# Patient Record
Sex: Male | Born: 1985 | Race: White | Hispanic: No | Marital: Single | Smoking: Current every day smoker
Health system: Southern US, Community
[De-identification: ages and names within clinical notes are randomized; demographics above are authoritative.]

## PROBLEM LIST (undated history)

## (undated) DIAGNOSIS — K219 Gastro-esophageal reflux disease without esophagitis: Secondary | ICD-10-CM

## (undated) DIAGNOSIS — K859 Acute pancreatitis without necrosis or infection, unspecified: Secondary | ICD-10-CM

## (undated) HISTORY — PX: TESTICLE SURGERY: SHX794

## (undated) HISTORY — PX: NO PAST SURGERIES: SHX2092

---

## 2014-12-01 ENCOUNTER — Emergency Department (HOSPITAL_COMMUNITY)
Admission: EM | Admit: 2014-12-01 | Discharge: 2014-12-01 | Disposition: A | Discharge status: Home / Self Care | Payer: Self-pay | Attending: Emergency Medicine | Admitting: Emergency Medicine

## 2014-12-01 ENCOUNTER — Emergency Department (HOSPITAL_COMMUNITY): Payer: Self-pay

## 2014-12-01 ENCOUNTER — Encounter (HOSPITAL_COMMUNITY): Payer: Self-pay | Admitting: Emergency Medicine

## 2014-12-01 DIAGNOSIS — Y9289 Other specified places as the place of occurrence of the external cause: Secondary | ICD-10-CM | POA: Insufficient documentation

## 2014-12-01 DIAGNOSIS — Y998 Other external cause status: Secondary | ICD-10-CM | POA: Insufficient documentation

## 2014-12-01 DIAGNOSIS — W1839XA Other fall on same level, initial encounter: Secondary | ICD-10-CM | POA: Insufficient documentation

## 2014-12-01 DIAGNOSIS — Z720 Tobacco use: Secondary | ICD-10-CM | POA: Insufficient documentation

## 2014-12-01 DIAGNOSIS — S20212A Contusion of left front wall of thorax, initial encounter: Secondary | ICD-10-CM | POA: Insufficient documentation

## 2014-12-01 DIAGNOSIS — Y9389 Activity, other specified: Secondary | ICD-10-CM | POA: Insufficient documentation

## 2014-12-01 DIAGNOSIS — S3992XA Unspecified injury of lower back, initial encounter: Secondary | ICD-10-CM | POA: Insufficient documentation

## 2014-12-01 MED ORDER — IBUPROFEN 800 MG PO TABS
800.0000 mg | ORAL_TABLET | Freq: Once | ORAL | Status: AC
  Administered 2014-12-01: 800 mg via ORAL
  Filled 2014-12-01: qty 1

## 2014-12-01 MED ORDER — BACLOFEN 10 MG PO TABS: 10.0000 mg | ORAL_TABLET | Freq: Three times a day (TID) | ORAL | Status: AC

## 2014-12-01 MED ORDER — HYDROCODONE-ACETAMINOPHEN 5-325 MG PO TABS
1.0000 | ORAL_TABLET | Freq: Once | ORAL | Status: AC
  Administered 2014-12-01: 1 via ORAL
  Filled 2014-12-01: qty 1

## 2014-12-01 MED ORDER — HYDROCODONE-ACETAMINOPHEN 5-325 MG PO TABS: 1.0000 | ORAL_TABLET | ORAL | Status: DC | PRN

## 2014-12-01 MED ORDER — DIAZEPAM 5 MG PO TABS
5.0000 mg | ORAL_TABLET | Freq: Once | ORAL | Status: AC
  Administered 2014-12-01: 5 mg via ORAL
  Filled 2014-12-01: qty 1

## 2014-12-01 NOTE — Discharge Instructions (Signed)
The x-ray of your ribs and chest are negative for fracture or other acute problem. Please practice taking deep breaths several times during the hour. Please use baclofen and ibuprofen daily. May use Norco for more severe pain. Chest Contusion A chest contusion is a deep bruise on your chest area. Contusions are the result of an injury that caused bleeding under the skin. A chest contusion may involve bruising of the skin, muscles, or ribs. The contusion may turn blue, purple, or yellow. Minor injuries will give you a painless contusion, but more severe contusions may stay painful and swollen for a few weeks. CAUSES  A contusion is usually caused by a blow, trauma, or direct force to an area of the body. SYMPTOMS   Swelling and redness of the injured area.  Discoloration of the injured area.  Tenderness and soreness of the injured area.  Pain. DIAGNOSIS  The diagnosis can be made by taking a history and performing a physical exam. An X-ray, CT scan, or MRI may be needed to determine if there were any associated injuries, such as broken bones (fractures) or internal injuries. TREATMENT  Often, the best treatment for a chest contusion is resting, icing, and applying cold compresses to the injured area. Deep breathing exercises may be recommended to reduce the risk of pneumonia. Over-the-counter medicines may also be recommended for pain control. HOME CARE INSTRUCTIONS   Put ice on the injured area.  Put ice in a plastic bag.  Place a towel between your skin and the bag.  Leave the ice on for 15-20 minutes, 03-04 times a day.  Only take over-the-counter or prescription medicines as directed by your caregiver. Your caregiver may recommend avoiding anti-inflammatory medicines (aspirin, ibuprofen, and naproxen) for 48 hours because these medicines may increase bruising.  Rest the injured area.  Perform deep-breathing exercises as directed by your caregiver.  Stop smoking if you  smoke.  Do not lift objects over 5 pounds (2.3 kg) for 3 days or longer if recommended by your caregiver. SEEK IMMEDIATE MEDICAL CARE IF:   You have increased bruising or swelling.  You have pain that is getting worse.  You have difficulty breathing.  You have dizziness, weakness, or fainting.  You have blood in your urine or stool.  You cough up or vomit blood.  Your swelling or pain is not relieved with medicines. MAKE SURE YOU:   Understand these instructions.  Will watch your condition.  Will get help right away if you are not doing well or get worse. Document Released: 02/14/2001 Document Revised: 02/14/2012 Document Reviewed: 11/13/2011 [facility] Patient Information 2015 [place], [place]. This information is not intended to replace advice given to you by your health care provider. Make sure you discuss any questions you have with your health care provider.

## 2014-12-01 NOTE — ED Provider Notes (Signed)
CSN: [id number]     Arrival date & time 12/01/14  1326 History   First MD Initiated Contact with Patient 12/01/14 1538     Chief Complaint  Patient presents with  . Fall     (Consider location/radiation/quality/duration/timing/severity/associated sxs/prior Treatment) Patient is a 29 y.o. male presenting with fall. The history is provided by the patient.  Fall This is a new problem. The current episode started yesterday. The problem occurs intermittently. The problem has been gradually worsening. Pertinent negatives include no abdominal pain, coughing, fever, numbness or weakness. Associated symptoms comments: Low back pain and left rib pain. Exacerbated by: movement and palpation. He has tried acetaminophen for the symptoms. The treatment provided no relief.    History reviewed. No pertinent past medical history. History reviewed. No pertinent past surgical history. Family History  Problem Relation Age of Onset  . Cancer Other    History  Substance Use Topics  . Smoking status: Current Every Day Smoker -- 0.50 packs/day    Types: Cigarettes  . Smokeless tobacco: Never Used  . Alcohol Use: Yes     Comment: occas    Review of Systems  Constitutional: Negative for fever.  Respiratory: Negative for cough and shortness of breath.   Gastrointestinal: Negative for abdominal pain.  Musculoskeletal: Positive for back pain.  Neurological: Negative for weakness and numbness.  All other systems reviewed and are negative.     Allergies  Review of patient's allergies indicates no known allergies.  Home Medications   Prior to Admission medications   Not on File   BP 139/86 mmHg  Temp(Src) 98.4 F (36.9 C) (Oral)  Resp 14  Ht 5\' 11"  (1.803 m)  Wt 185 lb (83.915 kg)  BMI 25.81 kg/m2  SpO2 97% Physical Exam  Constitutional: He is oriented to person, place, and time. He appears well-developed and well-nourished.  Non-toxic appearance.  HENT:  Head: Normocephalic.  Right Ear:  Tympanic membrane and external ear normal.  Left Ear: Tympanic membrane and external ear normal.  Eyes: EOM and lids are normal. Pupils are equal, round, and reactive to light.  Neck: Normal range of motion. Neck supple. Carotid bruit is not present.  Cardiovascular: Normal rate, regular rhythm, normal heart sounds, intact distal pulses and normal pulses.   Pulmonary/Chest: Breath sounds normal. No respiratory distress.  Left chest wall  Pain to palpation and with deep breath. Symmetrical rise and fall of the chest.  Abdominal: Soft. Bowel sounds are normal. There is no tenderness. There is no guarding.  Musculoskeletal: Normal range of motion.  Lymphadenopathy:       Head (right side): No submandibular adenopathy present.       Head (left side): No submandibular adenopathy present.    He has no cervical adenopathy.  Neurological: He is alert and oriented to person, place, and time. He has normal strength. No cranial nerve deficit or sensory deficit.  Skin: Skin is warm and dry.  Psychiatric: He has a normal mood and affect. His speech is normal.  Nursing note and vitals reviewed.   ED Course  Procedures (including critical care time) Labs Review Labs Reviewed - No data to display  Imaging Review Dg Ribs Unilateral W/chest Left  12/01/2014   CLINICAL DATA:  Acute left-sided rib pain after fall yesterday.  EXAM: LEFT RIBS AND CHEST - 3+ VIEW  COMPARISON:  None.  FINDINGS: No fracture or other bone lesions are seen involving the ribs. There is no evidence of pneumothorax or pleural effusion. Both lungs  are clear. Heart size and mediastinal contours are within normal limits.  IMPRESSION: Normal left ribs.  No acute cardiopulmonary abnormality seen.   Electronically Signed   By: Marijo Conception, M.D.   On: 12/01/2014 14:20   Dg Lumbar Spine Complete  12/01/2014   CLINICAL DATA:  Low back pain and left-sided rib pain secondary a fall yesterday.  EXAM: LUMBAR SPINE - COMPLETE 4+ VIEW   COMPARISON:  None.  FINDINGS: There is no evidence of lumbar spine fracture. Alignment is normal. Intervertebral disc spaces are maintained.  IMPRESSION: Normal exam.   Electronically Signed   By: Lorriane Shire M.D.   On: 12/01/2014 14:20     EKG Interpretation None      MDM  Xray of the left ribs and chest is neg for rib fx or lung abnormality. Xray of the lumbar spine is negative for compression fracture. Pt informed of xray findings. Pulse ox 97% on room air. WNL by my interpretation.  Pt speaks in complete sentences. Pt to use baclofen and norco for pain. Discussed the need for frequently deep breaths. He will see PCP or return to the ED if any changes or problem.   Final diagnoses:  None    *I have reviewed nursing notes, vital signs, and all appropriate lab and imaging results for this patient.[address] PA-C 12/06/14 [place], MD 12/09/14 [phone]

## 2014-12-01 NOTE — ED Notes (Signed)
Pt reports falling yesterday, c/o low back pain and L side/rib pain.

## 2016-08-17 ENCOUNTER — Emergency Department (HOSPITAL_COMMUNITY): Payer: Self-pay

## 2016-08-17 ENCOUNTER — Emergency Department (HOSPITAL_COMMUNITY)
Admission: EM | Admit: 2016-08-17 | Discharge: 2016-08-17 | Disposition: A | Discharge status: Home / Self Care | Payer: Self-pay | Attending: Emergency Medicine | Admitting: Emergency Medicine

## 2016-08-17 ENCOUNTER — Encounter (HOSPITAL_COMMUNITY): Payer: Self-pay | Admitting: *Deleted

## 2016-08-17 DIAGNOSIS — F17210 Nicotine dependence, cigarettes, uncomplicated: Secondary | ICD-10-CM | POA: Insufficient documentation

## 2016-08-17 DIAGNOSIS — Y939 Activity, unspecified: Secondary | ICD-10-CM | POA: Insufficient documentation

## 2016-08-17 DIAGNOSIS — Y999 Unspecified external cause status: Secondary | ICD-10-CM | POA: Insufficient documentation

## 2016-08-17 DIAGNOSIS — R202 Paresthesia of skin: Secondary | ICD-10-CM | POA: Insufficient documentation

## 2016-08-17 DIAGNOSIS — S4992XA Unspecified injury of left shoulder and upper arm, initial encounter: Secondary | ICD-10-CM | POA: Insufficient documentation

## 2016-08-17 DIAGNOSIS — W01198A Fall on same level from slipping, tripping and stumbling with subsequent striking against other object, initial encounter: Secondary | ICD-10-CM | POA: Insufficient documentation

## 2016-08-17 DIAGNOSIS — Y929 Unspecified place or not applicable: Secondary | ICD-10-CM | POA: Insufficient documentation

## 2016-08-17 MED ORDER — METHOCARBAMOL 500 MG PO TABS: 1000.0000 mg | ORAL_TABLET | Freq: Four times a day (QID) | ORAL | 0 refills | Status: DC | PRN

## 2016-08-17 MED ORDER — HYDROCODONE-ACETAMINOPHEN 5-325 MG PO TABS
1.0000 | ORAL_TABLET | Freq: Once | ORAL | Status: AC
  Administered 2016-08-17: 1 via ORAL
  Filled 2016-08-17: qty 1

## 2016-08-17 MED ORDER — HYDROCODONE-ACETAMINOPHEN 5-325 MG PO TABS: ORAL_TABLET | ORAL | 0 refills | Status: DC

## 2016-08-17 NOTE — Discharge Instructions (Signed)
Take the prescriptions as directed.  Apply moist heat or ice to the area(s) of discomfort, for 15 minutes at a time, several times per day for the next few days.  Do not fall asleep on a heating or ice pack. Wear the sling as needed for comfort. You may have either a brachial plexus injury or an isolated shoulder joint injury.  Call the Orthopedic doctor today to schedule a follow up appointment within the next week.  Return to the Emergency Department immediately if worsening.

## 2016-08-17 NOTE — ED Notes (Signed)
Pt ambulated to Xray 

## 2016-08-17 NOTE — ED Notes (Signed)
Pt requesting pain medication. Dr Thurnell Garbe notified

## 2016-08-17 NOTE — ED Triage Notes (Signed)
Pt reports he went to bed at 1900 last night and woke up at 2130 with left arm numbness. Pt reports he has "pins and needles" feeling to the left arm. No facial droop, no slurred speech. Pt had fall yesterday and landed on left shoulder. Pt had no symptoms of left arm numbness immediately after fall. Pt has weak strength in left hand, unable to hold left arm up, left arm falls quickly to bed.

## 2016-08-17 NOTE — ED Provider Notes (Signed)
[place] DEPT Provider Note   CSN: [id number] Arrival date & time: 08/17/16  3716     History   Chief Complaint Chief Complaint  Patient presents with  . Numbness    left arm only  . Fall    last night    HPI David Miranda is a 31 y.o. male.   Fall     Pt was seen at [facility]. Per pt, c/o sudden onset and resolution of one episode of fall that occurred last evening. Pt states he tripped over a wire strung between to stakes in his yard last evening and landed onto his left side, specifically his left shoulder area. Pt states he laid down to sleep and when he woke up approximately 2 hours later his left arm "was tingling" (especially lower biceps/triceps area to hand) and he was unable to move his left shoulder. Also c/o left shoulder pain. States he "feels like his forearm is moving around on its own." Denies hitting head, no LOC, no AMS, no slurred speech, no facial droop, no back pain, no CP/SOB, no abd pain, no RUE symptoms, no lower extremities symptoms.    History reviewed. No pertinent past medical history.  There are no active problems to display for this patient.   History reviewed. No pertinent surgical history.     Home Medications    Prior to Admission medications   Medication Sig Start Date End Date Taking? Authorizing Provider  acetaminophen (TYLENOL) 500 MG tablet Take 1,000 mg by mouth every 6 (six) hours as needed for mild pain.    Historical Provider, MD  HYDROcodone-acetaminophen (NORCO/VICODIN) 5-325 MG per tablet Take 1 tablet by mouth every 4 (four) hours as needed. 12/01/14   Lily Kocher, PA-C    Family History Family History  Problem Relation Age of Onset  . Cancer Other     Social History Social History  Substance Use Topics  . Smoking status: Current Every Day Smoker    Packs/day: 0.50    Types: Cigarettes  . Smokeless tobacco: Never Used  . Alcohol use Yes     Comment: occas     Allergies   Patient has no known  allergies.   Review of Systems Review of Systems ROS: Statement: All systems negative except as marked or noted in the HPI; Constitutional: Negative for fever and chills. ; ; Eyes: Negative for eye pain, redness and discharge. ; ; ENMT: Negative for ear pain, hoarseness, nasal congestion, sinus pressure and sore throat. ; ; Cardiovascular: Negative for chest pain, palpitations, diaphoresis, dyspnea and peripheral edema. ; ; Respiratory: Negative for cough, wheezing and stridor. ; ; Gastrointestinal: Negative for nausea, vomiting, diarrhea, abdominal pain, blood in stool, hematemesis, jaundice and rectal bleeding. . ; ; Genitourinary: Negative for dysuria, flank pain and hematuria. ; ; Musculoskeletal: +left shoulder injury, pain. Negative for back pain and neck pain. Negative for swelling and deformity.; ; Skin: Negative for pruritus, rash, abrasions, blisters, bruising and skin lesion.; ; Neuro: +left shoulder weakness, paresthesias. Negative for headache, lightheadedness and neck stiffness. Negative for weakness, altered level of consciousness, altered mental status, involuntary movement, seizure and syncope.      Physical Exam Updated Vital Signs BP 146/99   Pulse 84   Temp 98.1 F (36.7 C) (Oral)   Resp 11   Ht 6' (1.829 m)   Wt 184 lb (83.5 kg)   SpO2 100%   BMI 24.95 kg/m   Physical Exam 0745: Physical examination:  Nursing notes reviewed; Vital signs and  O2 SAT reviewed;  Constitutional: Well developed, Well nourished, Well hydrated, In no acute distress; Head:  Normocephalic, atraumatic; Eyes: EOMI, PERRL, No scleral icterus; ENMT: Mouth and pharynx normal, Mucous membranes moist; Neck: Supple, Full range of motion, No lymphadenopathy; Cardiovascular: Regular rate and rhythm, No gallop; Respiratory: Breath sounds clear & equal bilaterally, No wheezes.  Speaking full sentences with ease, Normal respiratory effort/excursion; Chest: Nontender, Movement normal; Abdomen: Soft, Nontender,  Nondistended, Normal bowel sounds; Genitourinary: No CVA tenderness; Spine:  No midline CS, TS, LS tenderness.;  Extremities: Pulses normal, Left arm held with elbow flexed, forearm pronated. Left shoulder w/decreased ROM, esp with external rotation and ABD. +generalized TTP left shoulder, esp AC joint area. Left clavicle NT, scapula NT, proximal humerus NT, biceps tendon NT over bicipital groove.  Motor strength at shoulder decreased, unable to hold out left arm in ABD.  No obvious deformity, no abrasions, no ecchymosis, no edema. Left hand having intact and equal strength but decreased sensation,  in the distribution of the median, radial, and ulnar nerve function compared to opposite side.  Strong radial pulse. Left upper arm and forearm compartments soft. NT left elbow/wrist/hand. No edema, No calf edema or asymmetry.; Neuro: AA&Ox3, Major CN grossly intact.  Speech clear. Otherwise no gross focal motor or sensory deficits in extremities.; Skin: Color normal, Warm, Dry.   ED Treatments / Results  Labs (all labs ordered are listed, but only abnormal results are displayed)   EKG  EKG Interpretation None       Radiology   Procedures Procedures (including critical care time)  Medications Ordered in ED Medications - No data to display   Initial Impression / Assessment and Plan / ED Course  I have reviewed the triage vital signs and the nursing notes.  Pertinent labs & imaging results that were available during my care of the patient were reviewed by me and considered in my medical decision making (see chart for details).  MDM Reviewed: previous chart, nursing note and vitals Interpretation: CT scan and x-ray    Dg Chest 2 View Result Date: 08/17/2016 CLINICAL DATA:  Fall. EXAM: CHEST  2 VIEW COMPARISON:  12/01/2014 chest radiograph. FINDINGS: Stable cardiomediastinal silhouette with normal heart size. No pneumothorax. No pleural effusion. Lungs appear clear, with no acute  consolidative airspace disease and no pulmonary edema. No displaced fractures. IMPRESSION: No active cardiopulmonary disease. Electronically Signed   By: Ilona Sorrel M.D.   On: 08/17/2016 08:22   Dg Elbow Complete Left Result Date: 08/17/2016 CLINICAL DATA:  Status post tripping and falling last night landing on the left side and left elbow. EXAM: LEFT ELBOW - COMPLETE 3+ VIEW COMPARISON:  None in PACs FINDINGS: The bones of the elbow are subjectively adequately mineralized. The radial head is intact. The olecranon is normal. The distal humerus exhibits no acute abnormality. There is no joint effusion. The soft tissues are unremarkable. IMPRESSION: There is no acute bony abnormality of the left elbow. Electronically Signed   By: David  Martinique M.D.   On: 08/17/2016 08:21   Ct Cervical Spine Wo Contrast Result Date: 08/17/2016 CLINICAL DATA:  31 year old male who woke with left arm numbness. Left upper extremity weakness. Preceding fall onto the left shoulder during the day yesterday. Initial encounter. EXAM: CT CERVICAL SPINE WITHOUT CONTRAST TECHNIQUE: Multidetector CT imaging of the cervical spine was performed without intravenous contrast. Multiplanar CT image reconstructions were also generated. COMPARISON:  Left shoulder series 0800 hours today. FINDINGS: Alignment: Mild straightening of cervical lordosis. Cervicothoracic  junction alignment is within normal limits. Bilateral posterior element alignment is within normal limits. Skull base and vertebrae: Visualized skull base is intact. No atlanto-occipital dissociation. C1-C2 are intact and normally aligned. No cervical vertebral fracture. Soft tissues and spinal canal: No prevertebral fluid or swelling. No visible canal hematoma. Negative visualized noncontrast brain parenchyma. Negative noncontrast neck soft tissues. Disc levels: C2-C3:  Mild left uncovertebral hypertrophy.  No stenosis. C3-C4: Minor disc bulge. Mild uncovertebral hypertrophy. No  stenosis. C4-C5: Mild uncovertebral hypertrophy greater on the left. Mild left C5 foraminal stenosis. C5-C6:  Mild disc bulge.  No stenosis. C6-C7: Mild disc bulge. Mild right uncovertebral hypertrophy. Moderate right C7 foraminal stenosis. C7-T1:  Negative. Upper chest: Visible upper thoracic levels are intact. Negative lung apices. Other: Visualized paranasal sinuses and mastoids are well pneumatized. IMPRESSION: 1.  No fracture or listhesis in the cervical spine. 2. Minimal to mild cervical spine disc and endplate degeneration. No spinal stenosis suspected. Mild left C5 and moderate right C7 neural foraminal stenosis. Electronically Signed   By: Genevie Ann M.D.   On: 08/17/2016 08:37   Dg Shoulder Left Result Date: 08/17/2016 CLINICAL DATA:  Status post tripping and falling last night. Unable to raise the arm. EXAM: LEFT SHOULDER - 2+ VIEW COMPARISON:  None in PACs FINDINGS: The bones of the left shoulder are subjectively adequately mineralized. No acute fracture nor dislocation is observed. The glenohumeral and AC joint spaces are grossly normal. The observed portions of the left clavicle and upper left ribs are normal. IMPRESSION: There is no acute or significant chronic bony abnormality of the left shoulder. Electronically Signed   By: David  Martinique M.D.   On: 08/17/2016 08:23    1005:  Pt laying cuddled on stretcher with significant other laying in the bend of his left shoulder. Symptoms concerning for brachial plexus injury vs shoulder/rotator cuff/etc injury s/p fall. Doubt stroke. T/C to Ortho Dr. Stann Mainland, case discussed, including:  HPI, pertinent PM/SHx, VS/PE, dx testing, ED course and treatment: agrees regarding likely brachial plexus injury, no acute intervention needed at this time, will resolve spontaneously over the next several weeks/month, sling for comfort, f/u Ortho MD office. Dx and testing, as well as d/w Ortho MD, d/w pt and family.  Questions answered.  Verb understanding, agreeable to  d/c home with outpt f/u.   Final Clinical Impressions(s) / ED Diagnoses   Final diagnoses:  None    New Prescriptions New Prescriptions   No medications on file      Francine Graven, DO 08/20/16 1538

## 2017-12-26 ENCOUNTER — Inpatient Hospital Stay (HOSPITAL_COMMUNITY)
Admission: EM | Admit: 2017-12-26 | Discharge: 2018-01-03 | DRG: 438 | Disposition: A | Discharge status: Home / Self Care | Payer: Self-pay | Attending: Internal Medicine | Admitting: Internal Medicine

## 2017-12-26 ENCOUNTER — Emergency Department (HOSPITAL_COMMUNITY): Payer: Self-pay

## 2017-12-26 ENCOUNTER — Other Ambulatory Visit: Payer: Self-pay

## 2017-12-26 ENCOUNTER — Encounter (HOSPITAL_COMMUNITY): Payer: Self-pay | Admitting: Emergency Medicine

## 2017-12-26 DIAGNOSIS — F17210 Nicotine dependence, cigarettes, uncomplicated: Secondary | ICD-10-CM | POA: Diagnosis present

## 2017-12-26 DIAGNOSIS — T402X5A Adverse effect of other opioids, initial encounter: Secondary | ICD-10-CM | POA: Diagnosis present

## 2017-12-26 DIAGNOSIS — F1010 Alcohol abuse, uncomplicated: Secondary | ICD-10-CM | POA: Diagnosis present

## 2017-12-26 DIAGNOSIS — J90 Pleural effusion, not elsewhere classified: Secondary | ICD-10-CM | POA: Diagnosis not present

## 2017-12-26 DIAGNOSIS — K8591 Acute pancreatitis with uninfected necrosis, unspecified: Secondary | ICD-10-CM | POA: Diagnosis present

## 2017-12-26 DIAGNOSIS — R188 Other ascites: Secondary | ICD-10-CM | POA: Diagnosis not present

## 2017-12-26 DIAGNOSIS — R05 Cough: Secondary | ICD-10-CM

## 2017-12-26 DIAGNOSIS — K760 Fatty (change of) liver, not elsewhere classified: Secondary | ICD-10-CM | POA: Diagnosis present

## 2017-12-26 DIAGNOSIS — R112 Nausea with vomiting, unspecified: Secondary | ICD-10-CM

## 2017-12-26 DIAGNOSIS — K8521 Alcohol induced acute pancreatitis with uninfected necrosis: Principal | ICD-10-CM | POA: Diagnosis present

## 2017-12-26 DIAGNOSIS — R059 Cough, unspecified: Secondary | ICD-10-CM

## 2017-12-26 DIAGNOSIS — D72829 Elevated white blood cell count, unspecified: Secondary | ICD-10-CM | POA: Diagnosis present

## 2017-12-26 DIAGNOSIS — Z9119 Patient's noncompliance with other medical treatment and regimen: Secondary | ICD-10-CM

## 2017-12-26 DIAGNOSIS — K852 Alcohol induced acute pancreatitis without necrosis or infection: Secondary | ICD-10-CM | POA: Diagnosis present

## 2017-12-26 DIAGNOSIS — F10239 Alcohol dependence with withdrawal, unspecified: Secondary | ICD-10-CM | POA: Diagnosis present

## 2017-12-26 DIAGNOSIS — R1013 Epigastric pain: Secondary | ICD-10-CM | POA: Diagnosis present

## 2017-12-26 DIAGNOSIS — K5900 Constipation, unspecified: Secondary | ICD-10-CM

## 2017-12-26 DIAGNOSIS — J189 Pneumonia, unspecified organism: Secondary | ICD-10-CM

## 2017-12-26 DIAGNOSIS — Q451 Annular pancreas: Secondary | ICD-10-CM

## 2017-12-26 DIAGNOSIS — J181 Lobar pneumonia, unspecified organism: Secondary | ICD-10-CM | POA: Diagnosis present

## 2017-12-26 DIAGNOSIS — R109 Unspecified abdominal pain: Secondary | ICD-10-CM

## 2017-12-26 DIAGNOSIS — K859 Acute pancreatitis without necrosis or infection, unspecified: Secondary | ICD-10-CM

## 2017-12-26 DIAGNOSIS — E876 Hypokalemia: Secondary | ICD-10-CM | POA: Diagnosis present

## 2017-12-26 DIAGNOSIS — K5903 Drug induced constipation: Secondary | ICD-10-CM | POA: Diagnosis present

## 2017-12-26 DIAGNOSIS — S301XXA Contusion of abdominal wall, initial encounter: Secondary | ICD-10-CM | POA: Diagnosis present

## 2017-12-26 DIAGNOSIS — I10 Essential (primary) hypertension: Secondary | ICD-10-CM | POA: Diagnosis present

## 2017-12-26 LAB — URINALYSIS, ROUTINE W REFLEX MICROSCOPIC
Bilirubin Urine: NEGATIVE
Glucose, UA: NEGATIVE mg/dL
Hgb urine dipstick: NEGATIVE
Ketones, ur: NEGATIVE mg/dL
Leukocytes, UA: NEGATIVE
Nitrite: NEGATIVE
Protein, ur: NEGATIVE mg/dL
Specific Gravity, Urine: >1.046 — ABNORMAL HIGH (ref 1.005–1.030)
pH: 5 (ref 5.0–8.0)

## 2017-12-26 LAB — CBC WITH DIFFERENTIAL/PLATELET
Basophils Absolute: 0 10*3/uL (ref 0.0–0.1)
Basophils Relative: 0 %
Eosinophils Absolute: 0.1 10*3/uL (ref 0.0–0.7)
Eosinophils Relative: 0 %
HCT: 49.5 % (ref 39.0–52.0)
Hemoglobin: 17.8 g/dL — ABNORMAL HIGH (ref 13.0–17.0)
Lymphocytes Relative: 17 %
Lymphs Abs: 3.1 10*3/uL (ref 0.7–4.0)
MCH: 30 pg (ref 26.0–34.0)
MCHC: 36 g/dL (ref 30.0–36.0)
MCV: 83.5 fL (ref 78.0–100.0)
Monocytes Absolute: 1.3 10*3/uL — ABNORMAL HIGH (ref 0.1–1.0)
Monocytes Relative: 7 %
Neutro Abs: 14.2 10*3/uL — ABNORMAL HIGH (ref 1.7–7.7)
Neutrophils Relative %: 76 %
Platelets: 304 10*3/uL (ref 150–400)
RBC: 5.93 MIL/uL — ABNORMAL HIGH (ref 4.22–5.81)
RDW: 14.9 % (ref 11.5–15.5)
WBC: 18.7 10*3/uL — ABNORMAL HIGH (ref 4.0–10.5)

## 2017-12-26 LAB — COMPREHENSIVE METABOLIC PANEL
ALT: 76 U/L — ABNORMAL HIGH (ref 0–44)
AST: 53 U/L — ABNORMAL HIGH (ref 15–41)
Albumin: 4.4 g/dL (ref 3.5–5.0)
Alkaline Phosphatase: 90 U/L (ref 38–126)
Anion gap: 12 (ref 5–15)
BUN: 10 mg/dL (ref 6–20)
CO2: 23 mmol/L (ref 22–32)
Calcium: 9.4 mg/dL (ref 8.9–10.3)
Chloride: 105 mmol/L (ref 98–111)
Creatinine, Ser: 0.93 mg/dL (ref 0.61–1.24)
GFR calc Af Amer: >60 mL/min (ref >60)
GFR calc non Af Amer: >60 mL/min (ref >60)
Glucose, Bld: 173 mg/dL — ABNORMAL HIGH (ref 70–99)
Potassium: 3.4 mmol/L — ABNORMAL LOW (ref 3.5–5.1)
Sodium: 140 mmol/L (ref 135–145)
Total Bilirubin: 1 mg/dL (ref 0.3–1.2)
Total Protein: 8 g/dL (ref 6.5–8.1)

## 2017-12-26 LAB — RAPID URINE DRUG SCREEN, HOSP PERFORMED
Amphetamines: POSITIVE — AB
Barbiturates: NOT DETECTED
Benzodiazepines: NOT DETECTED
Cocaine: NOT DETECTED
Opiates: POSITIVE — AB
Tetrahydrocannabinol: NOT DETECTED

## 2017-12-26 LAB — LIPASE, BLOOD: Lipase: 1520 U/L — ABNORMAL HIGH (ref 11–51)

## 2017-12-26 MED ORDER — IOPAMIDOL (ISOVUE-300) INJECTION 61%
100.0000 mL | Freq: Once | INTRAVENOUS | Status: AC | PRN
  Administered 2017-12-26: 100 mL via INTRAVENOUS

## 2017-12-26 MED ORDER — THIAMINE HCL 100 MG/ML IJ SOLN: 100.0000 mg | Freq: Every day | INTRAMUSCULAR | Status: DC

## 2017-12-26 MED ORDER — LACTATED RINGERS IV SOLN
INTRAVENOUS | Status: DC
  Administered 2017-12-26 (×4): via INTRAVENOUS

## 2017-12-26 MED ORDER — METOCLOPRAMIDE HCL 5 MG/ML IJ SOLN
10.0000 mg | Freq: Once | INTRAMUSCULAR | Status: AC
  Administered 2017-12-26: 10 mg via INTRAVENOUS
  Filled 2017-12-26: qty 2

## 2017-12-26 MED ORDER — LORAZEPAM 2 MG/ML IJ SOLN
1.0000 mg | Freq: Four times a day (QID) | INTRAMUSCULAR | Status: AC | PRN
  Administered 2017-12-26 – 2017-12-29 (×4): 1 mg via INTRAVENOUS
  Filled 2017-12-26 (×2): qty 1

## 2017-12-26 MED ORDER — DIPHENHYDRAMINE HCL 50 MG/ML IJ SOLN
50.0000 mg | Freq: Once | INTRAMUSCULAR | Status: AC
  Administered 2017-12-26: 50 mg via INTRAVENOUS
  Filled 2017-12-26: qty 1

## 2017-12-26 MED ORDER — HYDROMORPHONE HCL 1 MG/ML IJ SOLN
1.0000 mg | INTRAMUSCULAR | Status: DC | PRN
  Administered 2017-12-26 – 2017-12-27 (×7): 1 mg via INTRAVENOUS
  Filled 2017-12-26 (×7): qty 1

## 2017-12-26 MED ORDER — LORAZEPAM 2 MG/ML IJ SOLN: 0.0000 mg | Freq: Two times a day (BID) | INTRAMUSCULAR | Status: DC

## 2017-12-26 MED ORDER — FOLIC ACID 1 MG PO TABS
1.0000 mg | ORAL_TABLET | Freq: Every day | ORAL | Status: DC
  Administered 2017-12-27 – 2018-01-02 (×7): 1 mg via ORAL
  Filled 2017-12-26 (×8): qty 1

## 2017-12-26 MED ORDER — PROMETHAZINE HCL 25 MG/ML IJ SOLN
12.5000 mg | Freq: Four times a day (QID) | INTRAMUSCULAR | Status: DC | PRN
  Administered 2017-12-26 – 2017-12-29 (×4): 12.5 mg via INTRAVENOUS
  Filled 2017-12-26 (×4): qty 1

## 2017-12-26 MED ORDER — ENOXAPARIN SODIUM 40 MG/0.4ML ~~LOC~~ SOLN
40.0000 mg | SUBCUTANEOUS | Status: DC
  Administered 2017-12-26 – 2018-01-02 (×8): 40 mg via SUBCUTANEOUS
  Filled 2017-12-26 (×8): qty 0.4

## 2017-12-26 MED ORDER — SODIUM CHLORIDE 0.9 % IV BOLUS
1000.0000 mL | Freq: Once | INTRAVENOUS | Status: AC
  Administered 2017-12-26: 1000 mL via INTRAVENOUS

## 2017-12-26 MED ORDER — ACETAMINOPHEN 650 MG RE SUPP: 650.0000 mg | Freq: Four times a day (QID) | RECTAL | Status: DC | PRN

## 2017-12-26 MED ORDER — LORAZEPAM 1 MG PO TABS: 1.0000 mg | ORAL_TABLET | Freq: Four times a day (QID) | ORAL | Status: AC | PRN

## 2017-12-26 MED ORDER — ENOXAPARIN SODIUM 40 MG/0.4ML ~~LOC~~ SOLN: 40.0000 mg | SUBCUTANEOUS | Status: DC

## 2017-12-26 MED ORDER — LORAZEPAM 2 MG/ML IJ SOLN
0.0000 mg | Freq: Four times a day (QID) | INTRAMUSCULAR | Status: DC
  Administered 2017-12-27: 1 mg via INTRAVENOUS
  Filled 2017-12-26 (×3): qty 1

## 2017-12-26 MED ORDER — VITAMIN B-1 100 MG PO TABS
100.0000 mg | ORAL_TABLET | Freq: Every day | ORAL | Status: DC
  Administered 2017-12-27 – 2018-01-02 (×7): 100 mg via ORAL
  Filled 2017-12-26 (×8): qty 1

## 2017-12-26 MED ORDER — SODIUM CHLORIDE 0.45 % IV BOLUS
1000.0000 mL | Freq: Once | INTRAVENOUS | Status: DC
  Administered 2017-12-26: 1000 mL via INTRAVENOUS

## 2017-12-26 MED ORDER — ONDANSETRON HCL 4 MG/2ML IJ SOLN
4.0000 mg | Freq: Four times a day (QID) | INTRAMUSCULAR | Status: DC | PRN
  Administered 2017-12-26: 4 mg via INTRAVENOUS
  Filled 2017-12-26: qty 2

## 2017-12-26 MED ORDER — DICYCLOMINE HCL 10 MG PO CAPS
20.0000 mg | ORAL_CAPSULE | Freq: Once | ORAL | Status: AC
  Administered 2017-12-26: 20 mg via ORAL
  Filled 2017-12-26: qty 2

## 2017-12-26 MED ORDER — SODIUM CHLORIDE 0.9 % IV SOLN
INTRAVENOUS | Status: DC
  Administered 2017-12-27 – 2017-12-28 (×4): via INTRAVENOUS

## 2017-12-26 MED ORDER — MORPHINE SULFATE (PF) 4 MG/ML IV SOLN
4.0000 mg | Freq: Once | INTRAVENOUS | Status: AC
  Administered 2017-12-26: 4 mg via INTRAVENOUS
  Filled 2017-12-26: qty 1

## 2017-12-26 MED ORDER — ONDANSETRON HCL 4 MG PO TABS: 4.0000 mg | ORAL_TABLET | Freq: Four times a day (QID) | ORAL | Status: DC | PRN

## 2017-12-26 MED ORDER — ADULT MULTIVITAMIN W/MINERALS CH
1.0000 | ORAL_TABLET | Freq: Every day | ORAL | Status: DC
  Administered 2017-12-28 – 2018-01-02 (×6): 1 via ORAL
  Filled 2017-12-26 (×7): qty 1

## 2017-12-26 MED ORDER — ACETAMINOPHEN 325 MG PO TABS
650.0000 mg | ORAL_TABLET | Freq: Four times a day (QID) | ORAL | Status: DC | PRN
  Administered 2017-12-29 – 2018-01-01 (×5): 650 mg via ORAL
  Filled 2017-12-26 (×6): qty 2

## 2017-12-26 MED ORDER — ONDANSETRON HCL 4 MG/2ML IJ SOLN
4.0000 mg | Freq: Once | INTRAMUSCULAR | Status: AC
  Administered 2017-12-26: 4 mg via INTRAVENOUS
  Filled 2017-12-26: qty 2
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
## 2017-12-26 NOTE — ED Notes (Signed)
Pt cannot provide urine sample at this time.  

## 2017-12-26 NOTE — ED Notes (Signed)
Pt will not keep BP cuff on.
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
## 2017-12-26 NOTE — ED Triage Notes (Signed)
EMS called out for abdominal pain.  EMS reports that pt eat pizza last night and drank one pint of crown liquor.  Pain started last night about 11 pm.  Sharp shooting pain in all four quadrants.  Pt restless and crying during triage.

## 2017-12-27 ENCOUNTER — Inpatient Hospital Stay (HOSPITAL_COMMUNITY): Payer: Self-pay

## 2017-12-27 DIAGNOSIS — K8591 Acute pancreatitis with uninfected necrosis, unspecified: Secondary | ICD-10-CM | POA: Diagnosis present

## 2017-12-27 DIAGNOSIS — D72829 Elevated white blood cell count, unspecified: Secondary | ICD-10-CM | POA: Diagnosis present

## 2017-12-27 DIAGNOSIS — R1013 Epigastric pain: Secondary | ICD-10-CM | POA: Diagnosis present

## 2017-12-27 LAB — COMPREHENSIVE METABOLIC PANEL
ALT: 49 U/L — ABNORMAL HIGH (ref 0–44)
AST: 36 U/L (ref 15–41)
Albumin: 3.6 g/dL (ref 3.5–5.0)
Alkaline Phosphatase: 61 U/L (ref 38–126)
Anion gap: 14 (ref 5–15)
BUN: 8 mg/dL (ref 6–20)
CO2: 25 mmol/L (ref 22–32)
Calcium: 8.7 mg/dL — ABNORMAL LOW (ref 8.9–10.3)
Chloride: 101 mmol/L (ref 98–111)
Creatinine, Ser: 0.99 mg/dL (ref 0.61–1.24)
GFR calc Af Amer: >60 mL/min (ref >60)
GFR calc non Af Amer: >60 mL/min (ref >60)
Glucose, Bld: 126 mg/dL — ABNORMAL HIGH (ref 70–99)
Potassium: 3.5 mmol/L (ref 3.5–5.1)
Sodium: 140 mmol/L (ref 135–145)
Total Bilirubin: 1.5 mg/dL — ABNORMAL HIGH (ref 0.3–1.2)
Total Protein: 6.8 g/dL (ref 6.5–8.1)

## 2017-12-27 LAB — CBC
HCT: 54.8 % — ABNORMAL HIGH (ref 39.0–52.0)
Hemoglobin: 19.2 g/dL — ABNORMAL HIGH (ref 13.0–17.0)
MCH: 30.7 pg (ref 26.0–34.0)
MCHC: 35 g/dL (ref 30.0–36.0)
MCV: 87.5 fL (ref 78.0–100.0)
Platelets: 257 10*3/uL (ref 150–400)
RBC: 6.26 MIL/uL — ABNORMAL HIGH (ref 4.22–5.81)
RDW: 14.6 % (ref 11.5–15.5)
WBC: 26 10*3/uL — ABNORMAL HIGH (ref 4.0–10.5)

## 2017-12-27 LAB — LIPID PANEL
Cholesterol: 154 mg/dL (ref 0–200)
HDL: 36 mg/dL — ABNORMAL LOW (ref >40)
LDL Cholesterol: 92 mg/dL (ref 0–99)
Total CHOL/HDL Ratio: 4.3 RATIO
Triglycerides: 128 mg/dL (ref <150)
VLDL: 26 mg/dL (ref 0–40)

## 2017-12-27 LAB — HIV ANTIBODY (ROUTINE TESTING W REFLEX): HIV Screen 4th Generation wRfx: NONREACTIVE

## 2017-12-27 LAB — LIPASE, BLOOD: Lipase: 1063 U/L — ABNORMAL HIGH (ref 11–51)

## 2017-12-27 LAB — HEMOGLOBIN A1C
Hgb A1c MFr Bld: 5.2 % (ref 4.8–5.6)
Mean Plasma Glucose: 102.54 mg/dL

## 2017-12-27 MED ORDER — SODIUM CHLORIDE 0.9 % IV SOLN
1.0000 g | Freq: Three times a day (TID) | INTRAVENOUS | Status: DC
  Administered 2017-12-27 – 2017-12-29 (×6): 1 g via INTRAVENOUS
  Filled 2017-12-27 (×13): qty 1

## 2017-12-27 MED ORDER — METRONIDAZOLE IN NACL 5-0.79 MG/ML-% IV SOLN
500.0000 mg | Freq: Three times a day (TID) | INTRAVENOUS | Status: DC
  Administered 2017-12-27 – 2017-12-29 (×7): 500 mg via INTRAVENOUS
  Filled 2017-12-27 (×7): qty 100

## 2017-12-27 MED ORDER — SODIUM CHLORIDE 0.9 % IV BOLUS
500.0000 mL | Freq: Once | INTRAVENOUS | Status: AC
  Administered 2017-12-27: 500 mL via INTRAVENOUS

## 2017-12-27 MED ORDER — HYDROMORPHONE HCL 1 MG/ML IJ SOLN
1.0000 mg | INTRAMUSCULAR | Status: DC | PRN
  Administered 2017-12-27: 1 mg via INTRAVENOUS
  Administered 2017-12-27 (×3): 1.5 mg via INTRAVENOUS
  Administered 2017-12-28 (×6): 1 mg via INTRAVENOUS
  Filled 2017-12-27: qty 1
  Filled 2017-12-27 (×2): qty 1.5
  Filled 2017-12-27: qty 1
  Filled 2017-12-27: qty 1.5
  Filled 2017-12-27 (×5): qty 1
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
## 2017-12-28 ENCOUNTER — Inpatient Hospital Stay (HOSPITAL_COMMUNITY): Payer: Self-pay

## 2017-12-28 DIAGNOSIS — K760 Fatty (change of) liver, not elsewhere classified: Secondary | ICD-10-CM

## 2017-12-28 DIAGNOSIS — D72829 Elevated white blood cell count, unspecified: Secondary | ICD-10-CM

## 2017-12-28 DIAGNOSIS — R112 Nausea with vomiting, unspecified: Secondary | ICD-10-CM

## 2017-12-28 DIAGNOSIS — F1010 Alcohol abuse, uncomplicated: Secondary | ICD-10-CM

## 2017-12-28 DIAGNOSIS — K859 Acute pancreatitis without necrosis or infection, unspecified: Secondary | ICD-10-CM

## 2017-12-28 DIAGNOSIS — R1013 Epigastric pain: Secondary | ICD-10-CM

## 2017-12-28 DIAGNOSIS — K852 Alcohol induced acute pancreatitis without necrosis or infection: Secondary | ICD-10-CM

## 2017-12-28 LAB — COMPREHENSIVE METABOLIC PANEL
ALT: 30 U/L (ref 0–44)
AST: 25 U/L (ref 15–41)
Albumin: 3 g/dL — ABNORMAL LOW (ref 3.5–5.0)
Alkaline Phosphatase: 48 U/L (ref 38–126)
Anion gap: 9 (ref 5–15)
BUN: 12 mg/dL (ref 6–20)
CO2: 28 mmol/L (ref 22–32)
Calcium: 8 mg/dL — ABNORMAL LOW (ref 8.9–10.3)
Chloride: 104 mmol/L (ref 98–111)
Creatinine, Ser: 0.97 mg/dL (ref 0.61–1.24)
GFR calc Af Amer: >60 mL/min (ref >60)
GFR calc non Af Amer: >60 mL/min (ref >60)
Glucose, Bld: 108 mg/dL — ABNORMAL HIGH (ref 70–99)
Potassium: 3.3 mmol/L — ABNORMAL LOW (ref 3.5–5.1)
Sodium: 141 mmol/L (ref 135–145)
Total Bilirubin: 1.7 mg/dL — ABNORMAL HIGH (ref 0.3–1.2)
Total Protein: 6.4 g/dL — ABNORMAL LOW (ref 6.5–8.1)

## 2017-12-28 LAB — CBC WITH DIFFERENTIAL/PLATELET
Basophils Absolute: 0 10*3/uL (ref 0.0–0.1)
Basophils Relative: 0 %
Eosinophils Absolute: 0 10*3/uL (ref 0.0–0.7)
Eosinophils Relative: 0 %
HCT: 47 % (ref 39.0–52.0)
Hemoglobin: 16.4 g/dL (ref 13.0–17.0)
Lymphocytes Relative: 9 %
Lymphs Abs: 1.6 10*3/uL (ref 0.7–4.0)
MCH: 30.7 pg (ref 26.0–34.0)
MCHC: 34.9 g/dL (ref 30.0–36.0)
MCV: 88 fL (ref 78.0–100.0)
Monocytes Absolute: 1.5 10*3/uL — ABNORMAL HIGH (ref 0.1–1.0)
Monocytes Relative: 8 %
Neutro Abs: 16 10*3/uL — ABNORMAL HIGH (ref 1.7–7.7)
Neutrophils Relative %: 83 %
Platelets: 196 10*3/uL (ref 150–400)
RBC: 5.34 MIL/uL (ref 4.22–5.81)
RDW: 14.8 % (ref 11.5–15.5)
WBC: 19.2 10*3/uL — ABNORMAL HIGH (ref 4.0–10.5)

## 2017-12-28 LAB — MAGNESIUM: Magnesium: 1.6 mg/dL — ABNORMAL LOW (ref 1.7–2.4)

## 2017-12-28 LAB — LIPASE, BLOOD: Lipase: 183 U/L — ABNORMAL HIGH (ref 11–51)

## 2017-12-28 MED ORDER — METOPROLOL TARTRATE 25 MG PO TABS
25.0000 mg | ORAL_TABLET | Freq: Two times a day (BID) | ORAL | Status: DC
  Administered 2017-12-28 – 2018-01-03 (×12): 25 mg via ORAL
  Filled 2017-12-28 (×12): qty 1

## 2017-12-28 MED ORDER — NICOTINE 21 MG/24HR TD PT24
21.0000 mg | MEDICATED_PATCH | Freq: Every day | TRANSDERMAL | Status: DC
  Administered 2017-12-28 – 2017-12-29 (×2): 21 mg via TRANSDERMAL
  Filled 2017-12-28 (×3): qty 1

## 2017-12-28 MED ORDER — MAGNESIUM SULFATE 4 GM/100ML IV SOLN
4.0000 g | Freq: Once | INTRAVENOUS | Status: AC
  Administered 2017-12-28: 4 g via INTRAVENOUS
  Filled 2017-12-28: qty 100

## 2017-12-28 MED ORDER — POTASSIUM CHLORIDE CRYS ER 20 MEQ PO TBCR
60.0000 meq | EXTENDED_RELEASE_TABLET | Freq: Once | ORAL | Status: AC
  Administered 2017-12-28: 60 meq via ORAL
  Filled 2017-12-28: qty 3

## 2017-12-28 MED ORDER — HYDROMORPHONE HCL 1 MG/ML IJ SOLN
0.5000 mg | INTRAMUSCULAR | Status: DC | PRN
  Administered 2017-12-28 – 2017-12-29 (×5): 1 mg via INTRAVENOUS
  Filled 2017-12-28: qty 0.5
  Filled 2017-12-28 (×5): qty 1

## 2017-12-28 MED ORDER — HYDRALAZINE HCL 20 MG/ML IJ SOLN
10.0000 mg | INTRAMUSCULAR | Status: DC | PRN
  Administered 2017-12-28: 10 mg via INTRAVENOUS
  Filled 2017-12-28: qty 1

## 2017-12-28 MED ORDER — ALUM & MAG HYDROXIDE-SIMETH 200-200-20 MG/5ML PO SUSP
30.0000 mL | Freq: Four times a day (QID) | ORAL | Status: DC | PRN
  Administered 2017-12-28 – 2017-12-30 (×2): 30 mL via ORAL
  Filled 2017-12-28 (×2): qty 30

## 2017-12-28 MED ORDER — POTASSIUM CHLORIDE IN NACL 20-0.9 MEQ/L-% IV SOLN
INTRAVENOUS | Status: DC
  Administered 2017-12-28 – 2018-01-01 (×4): via INTRAVENOUS

## 2017-12-28 NOTE — Progress Notes (Addendum)
PROGRESS NOTE  David Miranda  DTO:[id number]  DOB: 1985/09/05  DOA: 12/26/2017 PCP: Patient, No Pcp Per   Brief Admission Hx: David Miranda is a 32 y.o. male with medical history significant of alcohol abuse, presents with abdominal pain and diagnosed with acute pancreatitis, presumably related to chronic alcoholism.   MDM/Assessment & Plan:   1. Acute pancreatis, presumably from acute on chronic alcoholism, he had mostly optimal lipid panel with normal triglycerides, continue supportive care. With his elevated WBC, there is concern about necrotizing pancreatitis, will start him on cefepime/metronidazole IV after blood cultures, follow closely, continue pain and nausea meds, continue clear liquids, CT did not show any evidence of abscess.   Lipase is trending down.  2. Leukocytosis - trending down on antibiotics. chest xray with no acute findings, concerned about necrotizing pancreatitis, starting empiric broad spectrum antibiotics.  See orders.  Follow blood cultures.  3. Chronic alcoholism - Pt has been placed on CIWA protocol for alcohol withdrawal.  4. Sinus tachycardia - pt is afebrile, I worry that he is starting to have acute alcohol withdrawal, CIWA in place, he also does methamphetamines and hopefully not using in the hospital. He did try to leave the floor last night without permission.  He was counseled. 5. Tobacco abuse - nicotine patch ordered 21 mg.  6. Hypokalemia - oral replacement ordered, check magnesium.    DVT prophylaxis: lovenox Code Status: Full  Family Communication: wife bedside  Disposition Plan: home when medically stabilized  Antibiotics Cefepime/metronidazole 7/25  Subjective: Pt tolerating clears,   His abdominal pain is slightly improved.   Objective: Vitals:   12/27/17 2202 12/27/17 2215 12/27/17 2300 12/28/17 0127  BP:    (!) 123/97  Pulse: (!) 136 (!) 134 (!) 125 (!) 127  Resp:   16 19  Temp:    98.3 F (36.8 C)  TempSrc:    Oral  SpO2: 92% 97%  97% 94%  Weight:      Height:        Intake/Output Summary (Last 24 hours) at 12/28/2017 1107 Last data filed at 12/28/2017 0800 Gross per 24 hour  Intake 5434.17 ml  Output -  Net 5434.17 ml   Filed Weights   12/26/17 0910  Weight: 83.5 kg (184 lb)   REVIEW OF SYSTEMS  As per history otherwise all reviewed and reported negative  Exam:  General exam: awake, alert, NAD, cooperative.  Respiratory system: Clear. No increased work of breathing. Cardiovascular system: S1 & S2 heard, RRR. No JVD, murmurs, gallops, clicks or pedal edema. Gastrointestinal system: Abdomen is nondistended, soft and diffusely tender in the epigastric area with guarding. Normal bowel sounds heard. Central nervous system: Alert and oriented. No focal neurological deficits. Extremities: no CCE.  Data Reviewed: Basic Metabolic Panel: Recent Labs  Lab 12/26/17 0925 12/27/17 0429 12/28/17 0529  NA 140 140 141  K 3.4* 3.5 3.3*  CL 105 101 104  CO2 23 25 28   GLUCOSE 173* 126* 108*  BUN 10 8 12   CREATININE 0.93 0.99 0.97  CALCIUM 9.4 8.7* 8.0*   Liver Function Tests: Recent Labs  Lab 12/26/17 0925 12/27/17 0429 12/28/17 0529  AST 53* 36 25  ALT 76* 49* 30  ALKPHOS 90 61 48  BILITOT 1.0 1.5* 1.7*  PROT 8.0 6.8 6.4*  ALBUMIN 4.4 3.6 3.0*   Recent Labs  Lab 12/26/17 0925 12/27/17 0429 12/28/17 0529  LIPASE 1,520* 1,063* 183*   No results for input(s): AMMONIA in the last 168 hours. CBC: Recent  Labs  Lab 12/26/17 0925 12/27/17 0429 12/28/17 0529  WBC 18.7* 26.0* 19.2*  NEUTROABS 14.2*  --  16.0*  HGB 17.8* 19.2* 16.4  HCT 49.5 54.8* 47.0  MCV 83.5 87.5 88.0  PLT 304 257 196   Cardiac Enzymes: No results for input(s): CKTOTAL, CKMB, CKMBINDEX, TROPONINI in the last 168 hours. CBG (last 3)  No results for input(s): GLUCAP in the last 72 hours. Recent Results (from the past 240 hour(s))  Culture, blood (Routine X 2) w Reflex to ID Panel     Status: None (Preliminary result)    Collection Time: 12/27/17 10:06 AM  Result Value Ref Range Status   Specimen Description BLOOD RIGHT ARM  Final   Special Requests   Final    BOTTLES DRAWN AEROBIC AND ANAEROBIC Blood Culture results may not be optimal due to an inadequate volume of blood received in culture bottles   Culture   Final    NO GROWTH < 24 HOURS Performed at [facility], [address], [place], [place] [address]    Report Status PENDING  Incomplete  Culture, blood (Routine X 2) w Reflex to ID Panel     Status: None (Preliminary result)   Collection Time: 12/27/17 10:20 AM  Result Value Ref Range Status   Specimen Description BLOOD RIGHT ARM  Final   Special Requests   Final    BOTTLES DRAWN AEROBIC AND ANAEROBIC Blood Culture adequate volume   Culture   Final    NO GROWTH < 24 HOURS Performed at [facility], [address], [place], [place] [address]    Report Status PENDING  Incomplete     Studies: Dg Chest Port 1 View  Result Date: 12/27/2017 CLINICAL DATA:  Elevated white blood cell count, pancreatitis, smoker. EXAM: PORTABLE CHEST 1 VIEW COMPARISON:  PA and lateral chest x-ray of August 17, 2016 FINDINGS: The lungs are hypoinflated. There is no focal infiltrate. There is no pleural effusion. The heart and mediastinal structures are within the limits of normal. The bony structures are unremarkable. IMPRESSION: Bilateral hypoinflation which limits the study. There is no acute cardiopulmonary abnormality. Electronically Signed   By: David  Martinique M.D.   On: 12/27/2017 10:21   Scheduled Meds: . enoxaparin (LOVENOX) injection  40 mg Subcutaneous Q24H  . folic acid  1 mg Oral Daily  . LORazepam  0-4 mg Intravenous Q6H   Followed by  . LORazepam  0-4 mg Intravenous Q12H  . multivitamin with minerals  1 tablet Oral Daily  . nicotine  21 mg Transdermal Daily  . thiamine  100 mg Oral Daily   Or  . thiamine  100 mg Intravenous Daily   Continuous Infusions: . sodium chloride 150 mL/hr at 12/28/17 0601  .  ceFEPime (MAXIPIME) IV Stopped (12/28/17 0311)  . metronidazole 500 mg (12/28/17 1048)   Active Problems:   Acute alcoholic pancreatitis   Alcohol abuse   Necrotizing pancreatitis   Leukocytosis   Abdominal pain, epigastric  Time spent:   Irwin Brakeman, MD, FAAFP [facility] Pager [phone]  If 7PM-7AM, please contact night-coverage [email or web address] Password [email or web address] 12/28/2017, 11:07 AM    LOS: 2 days
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
## 2017-12-29 ENCOUNTER — Inpatient Hospital Stay (HOSPITAL_COMMUNITY): Payer: Self-pay

## 2017-12-29 ENCOUNTER — Encounter (HOSPITAL_COMMUNITY): Payer: Self-pay | Admitting: Radiology

## 2017-12-29 LAB — LIPASE, BLOOD: Lipase: 72 U/L — ABNORMAL HIGH (ref 11–51)

## 2017-12-29 LAB — COMPREHENSIVE METABOLIC PANEL
ALT: 24 U/L (ref 0–44)
AST: 20 U/L (ref 15–41)
Albumin: 2.9 g/dL — ABNORMAL LOW (ref 3.5–5.0)
Alkaline Phosphatase: 48 U/L (ref 38–126)
Anion gap: 9 (ref 5–15)
BUN: 9 mg/dL (ref 6–20)
CO2: 26 mmol/L (ref 22–32)
Calcium: 8 mg/dL — ABNORMAL LOW (ref 8.9–10.3)
Chloride: 102 mmol/L (ref 98–111)
Creatinine, Ser: 0.76 mg/dL (ref 0.61–1.24)
GFR calc Af Amer: >60 mL/min (ref >60)
GFR calc non Af Amer: >60 mL/min (ref >60)
Glucose, Bld: 111 mg/dL — ABNORMAL HIGH (ref 70–99)
Potassium: 3 mmol/L — ABNORMAL LOW (ref 3.5–5.1)
Sodium: 137 mmol/L (ref 135–145)
Total Bilirubin: 1.2 mg/dL (ref 0.3–1.2)
Total Protein: 6.4 g/dL — ABNORMAL LOW (ref 6.5–8.1)

## 2017-12-29 LAB — CBC WITH DIFFERENTIAL/PLATELET
Basophils Absolute: 0 10*3/uL (ref 0.0–0.1)
Basophils Relative: 0 %
Eosinophils Absolute: 0 10*3/uL (ref 0.0–0.7)
Eosinophils Relative: 0 %
HCT: 40.7 % (ref 39.0–52.0)
Hemoglobin: 14.3 g/dL (ref 13.0–17.0)
Lymphocytes Relative: 13 %
Lymphs Abs: 1.8 10*3/uL (ref 0.7–4.0)
MCH: 30.6 pg (ref 26.0–34.0)
MCHC: 35.1 g/dL (ref 30.0–36.0)
MCV: 87.2 fL (ref 78.0–100.0)
Monocytes Absolute: 1.2 10*3/uL — ABNORMAL HIGH (ref 0.1–1.0)
Monocytes Relative: 9 %
Neutro Abs: 10.5 10*3/uL — ABNORMAL HIGH (ref 1.7–7.7)
Neutrophils Relative %: 78 %
Platelets: 169 10*3/uL (ref 150–400)
RBC: 4.67 MIL/uL (ref 4.22–5.81)
RDW: 14.7 % (ref 11.5–15.5)
WBC: 13.6 10*3/uL — ABNORMAL HIGH (ref 4.0–10.5)

## 2017-12-29 LAB — URINALYSIS, ROUTINE W REFLEX MICROSCOPIC
Bacteria, UA: NONE SEEN
Bilirubin Urine: NEGATIVE
Glucose, UA: NEGATIVE mg/dL
Hgb urine dipstick: NEGATIVE
Ketones, ur: 5 mg/dL — AB
Nitrite: NEGATIVE
Protein, ur: 100 mg/dL — AB
Specific Gravity, Urine: 1.031 — ABNORMAL HIGH (ref 1.005–1.030)
pH: 6 (ref 5.0–8.0)

## 2017-12-29 LAB — MAGNESIUM: Magnesium: 2.4 mg/dL (ref 1.7–2.4)

## 2017-12-29 MED ORDER — SODIUM CHLORIDE 0.9 % IV SOLN
1.0000 g | Freq: Three times a day (TID) | INTRAVENOUS | Status: DC
  Administered 2017-12-29 – 2018-01-01 (×10): 1 g via INTRAVENOUS
  Filled 2017-12-29 (×13): qty 1

## 2017-12-29 MED ORDER — ONDANSETRON HCL 4 MG/2ML IJ SOLN
4.0000 mg | Freq: Three times a day (TID) | INTRAMUSCULAR | Status: DC
  Administered 2017-12-29 – 2018-01-03 (×19): 4 mg via INTRAVENOUS
  Filled 2017-12-29 (×20): qty 2

## 2017-12-29 MED ORDER — POLYETHYLENE GLYCOL 3350 17 G PO PACK
17.0000 g | PACK | Freq: Two times a day (BID) | ORAL | Status: DC
  Administered 2017-12-29: 17 g via ORAL
  Filled 2017-12-29: qty 1

## 2017-12-29 MED ORDER — ONDANSETRON HCL 4 MG/2ML IJ SOLN: 4.0000 mg | Freq: Four times a day (QID) | INTRAMUSCULAR | Status: DC | PRN

## 2017-12-29 MED ORDER — POLYETHYLENE GLYCOL 3350 17 G PO PACK: 17.0000 g | PACK | Freq: Two times a day (BID) | ORAL | Status: DC | PRN

## 2017-12-29 MED ORDER — ONDANSETRON HCL 4 MG PO TABS: 4.0000 mg | ORAL_TABLET | Freq: Four times a day (QID) | ORAL | Status: DC | PRN

## 2017-12-29 MED ORDER — SENNA 8.6 MG PO TABS
1.0000 | ORAL_TABLET | Freq: Every day | ORAL | Status: DC
  Administered 2017-12-29 – 2017-12-31 (×3): 8.6 mg via ORAL
  Filled 2017-12-29 (×3): qty 1

## 2017-12-29 MED ORDER — POTASSIUM CHLORIDE CRYS ER 20 MEQ PO TBCR: 40.0000 meq | EXTENDED_RELEASE_TABLET | Freq: Two times a day (BID) | ORAL | Status: DC

## 2017-12-29 MED ORDER — POTASSIUM CHLORIDE CRYS ER 20 MEQ PO TBCR
40.0000 meq | EXTENDED_RELEASE_TABLET | Freq: Every day | ORAL | Status: AC
  Administered 2017-12-29 – 2017-12-30 (×2): 40 meq via ORAL
  Filled 2017-12-29 (×2): qty 2

## 2017-12-29 MED ORDER — HYDROMORPHONE HCL 1 MG/ML IJ SOLN
1.0000 mg | INTRAMUSCULAR | Status: DC | PRN
  Administered 2017-12-29 – 2017-12-30 (×9): 1 mg via INTRAVENOUS
  Filled 2017-12-29 (×10): qty 1

## 2017-12-29 MED ORDER — PANTOPRAZOLE SODIUM 40 MG PO TBEC
40.0000 mg | DELAYED_RELEASE_TABLET | Freq: Two times a day (BID) | ORAL | Status: DC
  Administered 2017-12-29 – 2018-01-03 (×10): 40 mg via ORAL
  Filled 2017-12-29 (×10): qty 1

## 2017-12-29 MED ORDER — POTASSIUM CHLORIDE CRYS ER 20 MEQ PO TBCR
60.0000 meq | EXTENDED_RELEASE_TABLET | Freq: Once | ORAL | Status: AC
  Administered 2017-12-29: 60 meq via ORAL
  Filled 2017-12-29: qty 3

## 2017-12-29 NOTE — Progress Notes (Signed)
Reminded patient to use urinal to collect UA.
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
## 2017-12-29 NOTE — Progress Notes (Signed)
   Assessment/Plan: ADMITTED WITH ACUTE MODERATE PANCREATITIS EPISODE #1/DAY 5. ETIOLOGIES FOR PANCREATITIS INCLUDE GALLSTONE, ETOH AND LESS LIKELY ANATOMY/ANNULAR PANCREAS INPT WITHOUT OBSTRUCTIVE SYMPTOMS. CLINICALLY IMPROVED BUT PAIN/NAUSEA NOT IDEALLY CONTROLLED. NOW WITH PNA ON CXR.  PLAN: 1. ADD ZOFRAN QACHS. CHANGE DILAUDID TO 1 MG Q2H PRN 2. PROTONIX BID. 3. CONTINUE MAXIPIME. D/C FLAGYL NO NEED TO TREAT FOR INFECTED PANCREATIC NECROSIS. 4. D/C MIRALAX. CONTINUE SENNA. 5. EXPLAINED TO PT SMOKING MAY CAUSE PANCREATITIS TO HEAL MORE SLOWLY. 6. REPLACE KCL PO. CONTINUE MIVFs W/ 20 mEq/KCL/L 7. TOLERATING CLEARS FAIRLY WELL. ADVANCE DIET TO FULL LIQUIDS. IF CAN'T ADVANCE DIET PT WILL NEED PPN V. ENTERAL FEEDS. 8. ENCOURAGED PT TO AMBULATE   Subjective: Since I last evaluated the patient HIS PAIN IS NOT IDEALLY CONTROLLED.INITIALLY IT WAS IN THE FROM AND NOW HE'S HAVING BACK PAIN RADIATING TO THE FROM. KUB JUST DONE. CXR JUL 26 SHOWED LLL PNA. HAS CHEST PIN AND SOB. TOLERATING JELLO. BROTH MAKES HIM VOMIT.  HAVING SMALL BMs. NOT PASSING GAS.   Objective: Vital signs in last 24 hours: Vitals:   12/28/17 2129 12/29/17 0620  BP: 135/90 (!) 129/95  Pulse: (!) 102 (!) 114  Resp: 17 18  Temp: 99.6 F (37.6 C) 99.3 F (37.4 C)  SpO2: 96% 96%   General appearance: alert, cooperative and mild distress Resp: clear to auscultation bilaterally Cardio: regular rate and rhythm GI: soft, MILDLY TENDER EPIGASTRIC, MODERATE TENDERNESS IN RUQ AND LLQ; bowel sounds HYPOACTIVE, MILD DISTENTION. no masses,  no  Extremities: extremities normal, atraumatic, no cyanosis or edema NEURO: NO FOCAL DEFICITS  Lab Results:  K 3.0 LIPASE 72 WBC 13 Hb 14.3 cR 0.76   Studies/Results: Dg Chest 2 View  Result Date: 12/28/2017 CLINICAL DATA:  Cough and leukocytosis EXAM: CHEST - 2 VIEW COMPARISON:  December 27, 2017 FINDINGS: There is patchy consolidation in the left base with small left pleural effusion.  There is mild right base atelectasis. Heart size and pulmonary vascularity are normal. No adenopathy. No bone lesions. IMPRESSION: Patchy consolidation left base consistent with developing pneumonia. Small left pleural effusion. Mild right base atelectasis. Stable cardiac silhouette. Electronically Signed   By: Lowella Grip III M.D.   On: 12/28/2017 11:49    Medications: I have reviewed the patient's current medications.

## 2017-12-29 NOTE — Progress Notes (Signed)
Patient up and ambulating in hallway. Patient Ambulated to elevator and down to lobby.

## 2017-12-29 NOTE — Progress Notes (Signed)
Patient's girlfriend brought patient a milkshake after patient was told not to drink it.  Patient drank some of it and immediately start c/o severe abdominal pain and asking for pain medication.  Patient and girlfriend told he can only have liquids you can see through to drink..,no more milk shakes!

## 2017-12-29 NOTE — Progress Notes (Addendum)
PROGRESS NOTE  David Miranda  VPX:[id number]  DOB: 1985-06-28  DOA: 12/26/2017 PCP: Patient, No Pcp Per  Brief Admission Hx: David Miranda is a 32 y.o. male with medical history significant of alcohol abuse, presents with abdominal pain and diagnosed with acute pancreatitis, presumably related to chronic alcoholism.   MDM/Assessment & Plan:   1. Acute pancreatis, presumably from acute on chronic alcoholism, he had mostly optimal lipid panel with normal triglycerides, continue supportive care. With his elevated WBC, there is concern about necrotizing pancreatitis, He was started on cefepime/metronidazole IV after blood cultures, follow closely, continue pain and nausea meds, continue clear liquids, CT did not show any evidence of abscess.   Lipase is trending down.  2. Abdominal pain - Pt now reports that pain involves the flank and LLQ area, i'm concerned about constipation, will check abdominal xray today.  Check urinalysis.  3. Leukocytosis - trending down on antibiotics. chest xray suggesting pneumonia. Also concerned about necrotizing pancreatitis.  Continue IV antibiotics.   Follow blood cultures. No growth to date.   4. Chronic alcoholism - Pt has been placed on CIWA protocol for alcohol withdrawal.  5. Hypertension - He has had increasing blood pressures and I have started him on metoprolol 25 mg BID with better blood pressure control noted.   6. Sinus tachycardia - pt is afebrile, I worry that he having drug and alcohol withdrawal, CIWA in place, he also does methamphetamines and hopefully not using in the hospital. He did try to leave the floor without permission.  He was counseled that he cannot do this and verbalized understanding. 7. Tobacco abuse - nicotine patch ordered 21 mg.  He was counseled against using tobacco products.    8. Hypokalemia - oral and IV potassium ordered, replaced magnesium. Follow.  9. Hypomagnesemia - repleted.     DVT prophylaxis: lovenox Code Status: Full    Family Communication: wife bedside  Disposition Plan: home when medically stabilized  Antibiotics Cefepime/metronidazole 7/25  Subjective: Pt says that he has been having bowel movements but it is not documented, complains of uncontrolled pain, no emesis.  He has been ambulating and tolerating clears.  He denies alcohol/drug withdrawal but has been tachycardic and hypertensive.  Pt complains of bilateral flank pain and suprapubic pain.    Objective: Vitals:   12/28/17 1948 12/28/17 2000 12/28/17 2129 12/29/17 0620  BP: (!) 132/98  135/90 (!) 129/95  Pulse: (!) 121  (!) 102 (!) 114  Resp:   17 18  Temp:   99.6 F (37.6 C) 99.3 F (37.4 C)  TempSrc:   Oral Oral  SpO2:  (!) 86% 96% 96%  Weight:      Height:        Intake/Output Summary (Last 24 hours) at 12/29/2017 0855 Last data filed at 12/29/2017 4627 Gross per 24 hour  Intake 5259.52 ml  Output -  Net 5259.52 ml   Filed Weights   12/26/17 0910  Weight: 83.5 kg (184 lb)   REVIEW OF SYSTEMS  As per history otherwise all reviewed and reported negative  Exam:  General exam: awake, alert, NAD, cooperative.  Respiratory system: Clear. No increased work of breathing. Cardiovascular system: S1 & S2 heard, RRR. No JVD, murmurs, gallops, clicks or pedal edema. Gastrointestinal system: Abdomen is nondistended, soft and diffusely tender in the epigastric area with guarding. Normal bowel sounds heard. Central nervous system: Alert and oriented. No focal neurological deficits. Extremities: no CCE.  Data Reviewed: Basic Metabolic Panel: Recent Labs  Lab  12/26/17 7341 12/27/17 0429 12/28/17 0529 12/28/17 1113 12/29/17 0611  NA 140 140 141  --  137  K 3.4* 3.5 3.3*  --  3.0*  CL 105 101 104  --  102  CO2 23 25 28   --  26  GLUCOSE 173* 126* 108*  --  111*  BUN 10 8 12   --  9  CREATININE 0.93 0.99 0.97  --  0.76  CALCIUM 9.4 8.7* 8.0*  --  8.0*  MG  --   --   --  1.6* 2.4   Liver Function Tests: Recent Labs  Lab  12/26/17 0925 12/27/17 0429 12/28/17 0529 12/29/17 0611  AST 53* 36 25 20  ALT 76* 49* 30 24  ALKPHOS 90 61 48 48  BILITOT 1.0 1.5* 1.7* 1.2  PROT 8.0 6.8 6.4* 6.4*  ALBUMIN 4.4 3.6 3.0* 2.9*   Recent Labs  Lab 12/26/17 0925 12/27/17 0429 12/28/17 0529 12/29/17 0611  LIPASE 1,520* 1,063* 183* 72*   No results for input(s): AMMONIA in the last 168 hours. CBC: Recent Labs  Lab 12/26/17 0925 12/27/17 0429 12/28/17 0529 12/29/17 0611  WBC 18.7* 26.0* 19.2* 13.6*  NEUTROABS 14.2*  --  16.0* 10.5*  HGB 17.8* 19.2* 16.4 14.3  HCT 49.5 54.8* 47.0 40.7  MCV 83.5 87.5 88.0 87.2  PLT 304 257 196 169   Cardiac Enzymes: No results for input(s): CKTOTAL, CKMB, CKMBINDEX, TROPONINI in the last 168 hours. CBG (last 3)  No results for input(s): GLUCAP in the last 72 hours. Recent Results (from the past 240 hour(s))  Culture, blood (Routine X 2) w Reflex to ID Panel     Status: None (Preliminary result)   Collection Time: 12/27/17 10:06 AM  Result Value Ref Range Status   Specimen Description BLOOD RIGHT ARM  Final   Special Requests   Final    BOTTLES DRAWN AEROBIC AND ANAEROBIC Blood Culture results may not be optimal due to an inadequate volume of blood received in culture bottles   Culture   Final    NO GROWTH 2 DAYS Performed at [facility], [address] [place], [place] [address]    Report Status PENDING  Incomplete  Culture, blood (Routine X 2) w Reflex to ID Panel     Status: None (Preliminary result)   Collection Time: 12/27/17 10:20 AM  Result Value Ref Range Status   Specimen Description BLOOD RIGHT ARM  Final   Special Requests   Final    BOTTLES DRAWN AEROBIC AND ANAEROBIC Blood Culture adequate volume   Culture   Final    NO GROWTH 2 DAYS Performed at [facility], [address], [place], [place] [address]    Report Status PENDING  Incomplete     Studies: Dg Chest 2 View  Result Date: 12/28/2017 CLINICAL DATA:  Cough and leukocytosis EXAM: CHEST - 2  VIEW COMPARISON:  December 27, 2017 FINDINGS: There is patchy consolidation in the left base with small left pleural effusion. There is mild right base atelectasis. Heart size and pulmonary vascularity are normal. No adenopathy. No bone lesions. IMPRESSION: Patchy consolidation left base consistent with developing pneumonia. Small left pleural effusion. Mild right base atelectasis. Stable cardiac silhouette. Electronically Signed   By: Lowella Grip III M.D.   On: 12/28/2017 11:49   Dg Chest Port 1 View  Result Date: 12/27/2017 CLINICAL DATA:  Elevated white blood cell count, pancreatitis, smoker. EXAM: PORTABLE CHEST 1 VIEW COMPARISON:  PA and lateral chest x-ray of August 17, 2016 FINDINGS: The lungs are hypoinflated. There is no focal infiltrate. There is no pleural effusion. The heart and mediastinal structures are within the limits of normal. The bony structures are unremarkable. IMPRESSION: Bilateral hypoinflation which limits the study. There is no acute cardiopulmonary abnormality. Electronically Signed   By: David  Martinique M.D.   On: 12/27/2017 10:21   Scheduled Meds: . enoxaparin (LOVENOX) injection  40 mg Subcutaneous Q24H  . folic acid  1 mg Oral Daily  . LORazepam  0-4 mg Intravenous Q12H  . metoprolol tartrate  25 mg Oral BID  . multivitamin with minerals  1 tablet Oral Daily  . nicotine  21 mg Transdermal Daily  . polyethylene glycol  17 g Oral BID  . senna  1 tablet Oral Daily  . thiamine  100 mg Oral Daily   Or  . thiamine  100 mg Intravenous Daily   Continuous Infusions: . 0.9 % NaCl with KCl 20 mEq / L 50 mL/hr at 12/28/17 1433  . ceFEPime (MAXIPIME) IV Stopped (12/29/17 0400)  . metronidazole 500 mg (12/29/17 8811)   Active Problems:   Acute alcoholic pancreatitis   Alcohol abuse   Necrotizing pancreatitis   Leukocytosis   Abdominal pain, epigastric   Pancreatitis   Non-intractable vomiting with nausea   Hepatic steatosis   Time spent:   Irwin Brakeman, MD,  FAAFP [facility] Pager [phone]  If 7PM-7AM, please contact night-coverage [email or web address] Password [email or web address] 12/29/2017, 8:55 AM    LOS: 3 days

## 2017-12-29 NOTE — Progress Notes (Signed)
Patient with temp 101.7, MD made aware.  Tylenol given - will reassess

## 2017-12-30 ENCOUNTER — Inpatient Hospital Stay (HOSPITAL_COMMUNITY): Payer: Self-pay

## 2017-12-30 DIAGNOSIS — R58 Hemorrhage, not elsewhere classified: Secondary | ICD-10-CM

## 2017-12-30 DIAGNOSIS — K8521 Alcohol induced acute pancreatitis with uninfected necrosis: Principal | ICD-10-CM

## 2017-12-30 LAB — CBC WITH DIFFERENTIAL/PLATELET
Basophils Absolute: 0 10*3/uL (ref 0.0–0.1)
Basophils Relative: 0 %
Eosinophils Absolute: 0.3 10*3/uL (ref 0.0–0.7)
Eosinophils Relative: 3 %
HCT: 34.5 % — ABNORMAL LOW (ref 39.0–52.0)
Hemoglobin: 12 g/dL — ABNORMAL LOW (ref 13.0–17.0)
Lymphocytes Relative: 19 %
Lymphs Abs: 2 10*3/uL (ref 0.7–4.0)
MCH: 30.7 pg (ref 26.0–34.0)
MCHC: 34.8 g/dL (ref 30.0–36.0)
MCV: 88.2 fL (ref 78.0–100.0)
Monocytes Absolute: 1.5 10*3/uL — ABNORMAL HIGH (ref 0.1–1.0)
Monocytes Relative: 14 %
Neutro Abs: 6.7 10*3/uL (ref 1.7–7.7)
Neutrophils Relative %: 64 %
Platelets: 196 10*3/uL (ref 150–400)
RBC: 3.91 MIL/uL — ABNORMAL LOW (ref 4.22–5.81)
RDW: 14.6 % (ref 11.5–15.5)
WBC: 10.5 10*3/uL (ref 4.0–10.5)

## 2017-12-30 LAB — COMPREHENSIVE METABOLIC PANEL
ALT: 20 U/L (ref 0–44)
AST: 22 U/L (ref 15–41)
Albumin: 2.8 g/dL — ABNORMAL LOW (ref 3.5–5.0)
Alkaline Phosphatase: 47 U/L (ref 38–126)
Anion gap: 6 (ref 5–15)
BUN: 9 mg/dL (ref 6–20)
CO2: 27 mmol/L (ref 22–32)
Calcium: 7.7 mg/dL — ABNORMAL LOW (ref 8.9–10.3)
Chloride: 101 mmol/L (ref 98–111)
Creatinine, Ser: 0.73 mg/dL (ref 0.61–1.24)
GFR calc Af Amer: >60 mL/min (ref >60)
GFR calc non Af Amer: >60 mL/min (ref >60)
Glucose, Bld: 101 mg/dL — ABNORMAL HIGH (ref 70–99)
Potassium: 3.7 mmol/L (ref 3.5–5.1)
Sodium: 134 mmol/L — ABNORMAL LOW (ref 135–145)
Total Bilirubin: 1.1 mg/dL (ref 0.3–1.2)
Total Protein: 6.1 g/dL — ABNORMAL LOW (ref 6.5–8.1)

## 2017-12-30 LAB — LIPASE, BLOOD: Lipase: 73 U/L — ABNORMAL HIGH (ref 11–51)

## 2017-12-30 LAB — MAGNESIUM: Magnesium: 2.1 mg/dL (ref 1.7–2.4)

## 2017-12-30 MED ORDER — HYDROMORPHONE HCL 1 MG/ML IJ SOLN
1.0000 mg | INTRAMUSCULAR | Status: DC | PRN
  Administered 2017-12-30 – 2018-01-03 (×27): 1 mg via INTRAVENOUS
  Filled 2017-12-30 (×29): qty 1

## 2017-12-30 MED ORDER — HYDROMORPHONE HCL 1 MG/ML IJ SOLN
1.0000 mg | Freq: Four times a day (QID) | INTRAMUSCULAR | Status: DC
  Administered 2017-12-30 – 2018-01-01 (×9): 1 mg via INTRAVENOUS
  Filled 2017-12-30 (×8): qty 1

## 2017-12-30 MED ORDER — NICOTINE 14 MG/24HR TD PT24
28.0000 mg | MEDICATED_PATCH | Freq: Every day | TRANSDERMAL | Status: DC
  Administered 2017-12-30: 28 mg via TRANSDERMAL
  Filled 2017-12-30: qty 2

## 2017-12-30 NOTE — Progress Notes (Signed)
Called by RN that patient developed ecchymosis in the left flank.  Patient admitted for acute pancreatitis. Patient seen and examined, on exam large ecchymosis noted in the left flank region Consistent with "Pearline Cables turner sign". I called and discussed with GI on-call Dr. Trinda Pascal, and she recommends stat CT scan of the abdomen for possible retroperitoneal bleed.  We will also check stat CBC. Plan  Stat CT abdomen and pelvis without contrast Stat CBC.  Critical care time spent-35 minutes

## 2017-12-30 NOTE — Progress Notes (Addendum)
PROGRESS NOTE    David Miranda  UMP:[id number]  DOB: 1985/12/13  DOA: 12/26/2017 PCP: Patient, No Pcp Per   Brief Admission Hx: David Miranda a 32 y.o.malewith medical history significant ofalcohol abuse, presents with abdominal pain and diagnosed with acute pancreatitis, presumably related to chronic alcoholism. Patient's pain has increased over night. Patient is non-compliant with treatment plan and goals.  MDM/Assessment & Plan:   Hospital Problems: Active Problems:   Acute alcoholic pancreatitis   Alcohol abuse   Necrotizing pancreatitis   Leukocytosis   Abdominal pain, epigastric   Pancreatitis   Non-intractable vomiting with nausea   Hepatic steatosis  1. Acute pancreatis, presumably from acute on chronic alcoholism, he had mostly optimal lipid panel with normal triglycerides, continue supportive care. His white blood count has returned to normal at 10.5 this morning. Although, he started having exacerbation of pain and fever last night after patient was given a milk shake by his girl friend. He has been instructed to only drink and eat clear liquid fluids. He had a repeat CT last night that showed increased pancreatic inflammatory change in fluid, increased abdominopelvic ascites, and pleural effusions. Will continue pain medication and fluids. Have scheduled consult with GI who is following.   2. Abdominal pain - Pt now reports that pain involves the flank and LLQ area that radiates to back. CT does not suggest constipation. Suspect pain radiating to back is related to pancreatitis pain radiating to back. 3. Fever - Pt spiked fever to 101.7 yesterday.  We ordered repeated blood cultures 7/27 with no growth to date.  Following.  Tylenol as needed.   4. Leukocytosis - trending down on antibiotics. chest xray suggesting pneumonia. Also concerned about necrotizing pancreatitis.  Continue IV antibiotics.   Follow blood cultures. No growth as of 12/30/2017.   5. Pneumonia - He is  being treated with cefepime IV.  Plan to recheck CXR in AM.   6. Chronic alcoholism - Pt has been placed on CIWA protocol for alcohol withdrawal.  7. Hypertension - He has had increasing blood pressures and I have started him on metoprolol 25 mg BID with better blood pressure control noted.  Blood pressure remains mildly hypertensive at 130/95. 8. Sinus tachycardia - The patient had a fever of 100.4 last night with mild tachycardia, HR of 104. Patient has CIWA protocol in place. He left the floor twice last night because wanted to smoke. 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 He has been instructed not to leave the hospitals grounds without supervision. He was counseled again on why he can not leave the floor. 9. Tobacco abuse - I have ordered and a larger dose of nicotine due to his addiction to smoking. He will get 28 mg of nicotine via one 21 mg patch and one 7 mg patch.  He was counseled against using tobacco products.    10. Hypokalemia - oral and IV potassium ordered, replaced magnesium. Follow.  11. Hypomagnesemia - repleted.    DVT prophylaxis: Lovenox Code Status: FULL CODE Family Communication: Girl friend at bedside. Disposition Plan: Discharge home one medically stablized  Consultants:  GI  Antimicrobials:  Cefepime 7/25  Metronidazole 7/25-7/27  Subjective: Patient states he wants to go outside and smoke. He continues to try to bargain for [place] to let him eat. Nursing staff and I have gone through many attempts to allay patient's anxiety about not being able to smoke.  Patient was counseled on the need to stay and be treated for pancreatitis.  He said he would stay for now.  He is complaining of back pain today.   Objective: Vitals:   12/29/17 1831 12/29/17 1949 12/29/17 2114 12/30/17 0500  BP:   (!) 137/96 (!) 130/95  Pulse:   (!) 102 100  Resp:   18 18  Temp: 99.4 F (37.4 C)  (!) 100.4 F (38 C) 99.2 F (37.3 C)  TempSrc:   Oral Oral  SpO2:  95% 94%  95%  Weight:      Height:        Intake/Output Summary (Last 24 hours) at 12/30/2017 0831 Last data filed at 12/30/2017 0645 Gross per 24 hour  Intake 1266.67 ml  Output -  Net 1266.67 ml   Filed Weights   12/26/17 0910  Weight: 83.5 kg (184 lb)    REVIEW OF SYSTEMS  As per history otherwise all reviewed and reported negative  Exam:  General exam: Patient is agitated sitting on edge of chair in room. NAD.  Respiratory system: Rales RLL.  No increased work of breathing. Cardiovascular system: S1 & S2 heard, RRR. No JVD, murmurs, gallops, clicks or pedal edema. Gastrointestinal system: Abdomen is nondistended, soft. There is tenderness in the epigastric area. There is a 4 inch by 2.5 inch area of bruising in the left lower abdominal area that is proximal to the left flank. Normal bowel sounds heard. Central nervous system: Alert and oriented. No focal neurological deficits. Extremities: trace pretibial edema. No cyanosis.  Data Reviewed: Basic Metabolic Panel: Recent Labs  Lab 12/26/17 0925 12/27/17 0429 12/28/17 0529 12/28/17 1113 12/29/17 0611 12/30/17 0332  NA 140 140 141  --  137 134*  K 3.4* 3.5 3.3*  --  3.0* 3.7  CL 105 101 104  --  102 101  CO2 23 25 28   --  26 27  GLUCOSE 173* 126* 108*  --  111* 101*  BUN 10 8 12   --  9 9  CREATININE 0.93 0.99 0.97  --  0.76 0.73  CALCIUM 9.4 8.7* 8.0*  --  8.0* 7.7*  MG  --   --   --  1.6* 2.4 2.1   Liver Function Tests: Recent Labs  Lab 12/26/17 0925 12/27/17 0429 12/28/17 0529 12/29/17 0611 12/30/17 0332  AST 53* 36 25 20 22   ALT 76* 49* 30 24 20   ALKPHOS 90 61 48 48 47  BILITOT 1.0 1.5* 1.7* 1.2 1.1  PROT 8.0 6.8 6.4* 6.4* 6.1*  ALBUMIN 4.4 3.6 3.0* 2.9* 2.8*   Recent Labs  Lab 12/26/17 0925 12/27/17 0429 12/28/17 0529 12/29/17 0611 12/30/17 0332  LIPASE 1,520* 1,063* 183* 72* 73*   No results for input(s): AMMONIA in the last 168 hours. CBC: Recent Labs  Lab 12/26/17 0925 12/27/17 0429  12/28/17 0529 12/29/17 0611 12/30/17 0332  WBC 18.7* 26.0* 19.2* 13.6* 10.5  NEUTROABS 14.2*  --  16.0* 10.5* 6.7  HGB 17.8* 19.2* 16.4 14.3 12.0*  HCT 49.5 54.8* 47.0 40.7 34.5*  MCV 83.5 87.5 88.0 87.2 88.2  PLT 304 257 196 169 196   Cardiac Enzymes: No results for input(s): CKTOTAL, CKMB, CKMBINDEX, TROPONINI in the last 168 hours. CBG (last 3)  No results for input(s): GLUCAP in the last 72 hours. Recent Results (from the past 240 hour(s))  Culture, blood (Routine X 2) w Reflex to ID Panel     Status: None (Preliminary result)   Collection Time: 12/27/17 10:06 AM  Result Value Ref Range Status   Specimen Description BLOOD RIGHT  ARM  Final   Special Requests   Final    BOTTLES DRAWN AEROBIC AND ANAEROBIC Blood Culture results may not be optimal due to an inadequate volume of blood received in culture bottles   Culture   Final    NO GROWTH 3 DAYS Performed at [facility], [address] [place], [place] [address]    Report Status PENDING  Incomplete  Culture, blood (Routine X 2) w Reflex to ID Panel     Status: None (Preliminary result)   Collection Time: 12/27/17 10:20 AM  Result Value Ref Range Status   Specimen Description BLOOD RIGHT ARM  Final   Special Requests   Final    BOTTLES DRAWN AEROBIC AND ANAEROBIC Blood Culture adequate volume   Culture   Final    NO GROWTH 3 DAYS Performed at [facility], [address], [place], [place] [address]    Report Status PENDING  Incomplete  Culture, blood (Routine X 2) w Reflex to ID Panel     Status: None (Preliminary result)   Collection Time: 12/29/17  4:58 PM  Result Value Ref Range Status   Specimen Description RIGHT ANTECUBITAL  Final   Special Requests   Final    BOTTLES DRAWN AEROBIC AND ANAEROBIC Blood Culture adequate volume   Culture   Final    NO GROWTH < 24 HOURS Performed at [facility], [address], [place], [place] [address]    Report Status PENDING  Incomplete  Culture, blood (Routine X 2) w Reflex to  ID Panel     Status: None (Preliminary result)   Collection Time: 12/29/17  4:59 PM  Result Value Ref Range Status   Specimen Description BLOOD RIGHT ARM  Final   Special Requests   Final    BOTTLES DRAWN AEROBIC AND ANAEROBIC Blood Culture adequate volume   Culture   Final    NO GROWTH < 24 HOURS Performed at [facility], [address] [place],  [address]    Report Status PENDING  Incomplete     Studies: Ct Abdomen Pelvis Wo Contrast  Result Date: 12/30/2017 CLINICAL DATA:  Necrotizing pancreatitis with clinical deterioration. EXAM: CT ABDOMEN AND PELVIS WITHOUT CONTRAST TECHNIQUE: Multidetector CT imaging of the abdomen and pelvis was performed following the standard protocol without IV contrast. COMPARISON:  CT 12/26/2017 FINDINGS: Lower chest: Small bilateral pleural effusions, new from prior exam. Increased bilateral lower lobe atelectasis. Hepatobiliary: New high-density material in the gallbladder is likely vicarious excretion of IV contrast from prior exam. Decreased hepatic density consistent with steatosis. Low-density near the hepatic dome on prior exam is not well visualized in the absence of contrast. Pancreas: Progressive peripancreatic fat stranding and peripancreatic fluid. Fluid adjacent to the pancreatic tail, image 38 series 2, as well as anterior body, image 34 series 2, may be organizing but has no definite well-defined border at this time. No internal air. Previously suggested annular pancreas is not well evaluated on the current exam due to lack contrast. Spleen: Normal in size without focal abnormality. Adrenals/Urinary Tract: No adrenal nodule. No hydronephrosis. No significant perinephric edema. Cyst in the left kidney is not as well-defined on the current exam. Urinary bladder is partially distended, no bladder wall thickening. Stomach/Bowel: Ingested material in the stomach. Distended distal esophagus containing intraluminal contents. Detailed bowel assessment is  limited in the absence of enteric and IV contrast. Fluid-filled normal caliber small bowel suggests ileus. A few small metallic densities persist in the cecum, previous densities in the distal  small bowel however no longer seen. Findings suggest ingested material. Liquid stool in the proximal colon without colonic wall thickening or inflammatory change. Normal appendix. Vascular/Lymphatic: Normal caliber abdominal aorta. No evidence of retroperitoneal bleed, mild stranding about the bilateral psoas muscles felt to be secondary to pancreatic inflammation. Few prominent retroperitoneal and external iliac nodes are likely reactive. Reproductive: Prostate is unremarkable. Other: Small to moderate abdominopelvic ascites has increased from prior exam. No free air. Edema of bilateral anterior abdominal wall subcutaneous tissues, left greater than right. Musculoskeletal: There are no acute or suspicious osseous abnormalities. IMPRESSION: 1. Increased peripancreatic inflammatory change in fluid, fluid adjacent to the tail and anterior body may be organizing but no well-defined pseudocyst or internal air. Recommend attention to this at follow-up. 2. Increased abdominopelvic ascites, small bilateral pleural effusions. Progressive edema in bilateral lower anterior abdominal wall subcutaneous tissues, greater on the left. 3. Distended stomach with ingested material in distal esophagus, delayed gastric emptying and or reflux. 4. Hepatic steatosis. Electronically Signed   By: Jeb Levering M.D.   On: 12/30/2017 03:28   Dg Chest 2 View  Result Date: 12/28/2017 CLINICAL DATA:  Cough and leukocytosis EXAM: CHEST - 2 VIEW COMPARISON:  December 27, 2017 FINDINGS: There is patchy consolidation in the left base with small left pleural effusion. There is mild right base atelectasis. Heart size and pulmonary vascularity are normal. No adenopathy. No bone lesions. IMPRESSION: Patchy consolidation left base consistent with developing  pneumonia. Small left pleural effusion. Mild right base atelectasis. Stable cardiac silhouette. Electronically Signed   By: Lowella Grip III M.D.   On: 12/28/2017 11:49   Dg Abd 1 View  Result Date: 12/29/2017 CLINICAL DATA:  Abdominal pain EXAM: ABDOMEN - 1 VIEW COMPARISON:  12/26/2017 FINDINGS: Scattered large and small bowel gas is noted. No abnormal mass or abnormal calcifications seen. No bony abnormality is seen. IMPRESSION: No acute abnormality noted. Electronically Signed   By: Inez Catalina M.D.   On: 12/29/2017 15:15   Dg Chest Port 1 View  Result Date: 12/30/2017 CLINICAL DATA:  Pneumonia, dyspnea EXAM: PORTABLE CHEST 1 VIEW COMPARISON:  12/28/2017 chest radiograph. FINDINGS: Low lung volumes. Stable cardiomediastinal silhouette with normal heart size. No pneumothorax. Possible stable trace left pleural effusion. No right pleural effusion. Hazy bibasilar lung opacities, stable in the left and minimally increased on the right. No pulmonary edema. IMPRESSION: Low lung volumes with hazy bibasilar lung opacities, stable on the left and minimally increased on the right, which could represent atelectasis, aspiration and/or pneumonia. Possible stable trace left pleural effusion. Electronically Signed   By: Ilona Sorrel M.D.   On: 12/30/2017 08:04   Scheduled Meds: . enoxaparin (LOVENOX) injection  40 mg Subcutaneous Q24H  . folic acid  1 mg Oral Daily  . LORazepam  0-4 mg Intravenous Q12H  . metoprolol tartrate  25 mg Oral BID  . multivitamin with minerals  1 tablet Oral Daily  . nicotine  28 mg Transdermal Daily  . ondansetron (ZOFRAN) IV  4 mg Intravenous TID WC & HS  . pantoprazole  40 mg Oral BID AC  . potassium chloride  40 mEq Oral Daily  . senna  1 tablet Oral Daily  . thiamine  100 mg Oral Daily   Continuous Infusions: . 0.9 % NaCl with KCl 20 mEq / L 50 mL/hr at 12/29/17 1627  . ceFEPime (MAXIPIME) IV Stopped (12/30/17 0415)    Active Problems:   Acute alcoholic  pancreatitis   Alcohol abuse  Necrotizing pancreatitis   Leukocytosis   Abdominal pain, epigastric   Pancreatitis   Non-intractable vomiting with nausea   Hepatic steatosis   Time spent:   Ashok Norris, Student AGACNP  Attending:  Irwin Brakeman, MD, FAAFP [facility] Pager [phone]  If 7PM-7AM, please contact night-coverage [email or web address] Password [facility] 12/30/2017, 8:31 AM    LOS: 4 days

## 2017-12-30 NOTE — Progress Notes (Signed)
Corrected calcium: 8.98 mg/dL

## 2017-12-30 NOTE — Progress Notes (Signed)
Patient off the floor for STAT CT scan WO Contrast

## 2017-12-30 NOTE — Progress Notes (Signed)
Patient has bruising to left flank/lower abd area, MD notified as bruise has increased  In size.

## 2017-12-30 NOTE — Progress Notes (Signed)
Patient ID: David Miranda, male   DOB: 1985/07/07, 32 y.o.   MRN: [id number]    Assessment/Plan: ADMITTED WITH ACUTE PANCREATITIS. Lipase improved. Ecchymosis on LEFT FLANK LIKELY RELATED TO LOVENOX INJECTION. CT SHOWED EVOLVING INFLAMMATION.  PLAN: 1. Add Dilaudid ATC & Q3h prn 2. Advance diet. 3. REPEAT IMAGING 4-6 weeks. 4. D/c NICOTINE PATCH. PT SAYS THEY DON'T HELP.  Subjective: Since I last evaluated the patient his pain is not ideally controlled. Had 3 loose BMs today. No vomiting. Nausea is good. Has back pain moree than anything now. Feels better than yesterday.  Objective: Vital signs in last 24 hours: Vitals:   12/29/17 2114 12/30/17 0500  BP: (!) 137/96 (!) 130/95  Pulse: (!) 102 100  Resp: 18 18  Temp: (!) 100.4 F (38 C) 99.2 F (37.3 C)  SpO2: 94% 95%   General appearance: alert, cooperative and no distress Resp: clear to auscultation bilaterally Cardio: regular rate and rhythm GI: soft, MILDLY tender IN EPIGASTRIUM, Moderate in RUQ; bowel sounds normal;  Lab Results:  Lipase 73   Studies/Results: Ct Abdomen Pelvis Wo Contrast  Result Date: 12/30/2017 CLINICAL DATA:  Necrotizing pancreatitis with clinical deterioration. EXAM: CT ABDOMEN AND PELVIS WITHOUT CONTRAST TECHNIQUE: Multidetector CT imaging of the abdomen and pelvis was performed following the standard protocol without IV contrast. COMPARISON:  CT 12/26/2017 FINDINGS: Lower chest: Small bilateral pleural effusions, new from prior exam. Increased bilateral lower lobe atelectasis. Hepatobiliary: New high-density material in the gallbladder is likely vicarious excretion of IV contrast from prior exam. Decreased hepatic density consistent with steatosis. Low-density near the hepatic dome on prior exam is not well visualized in the absence of contrast. Pancreas: Progressive peripancreatic fat stranding and peripancreatic fluid. Fluid adjacent to the pancreatic tail, image 38 series 2, as well as anterior body,  image 34 series 2, may be organizing but has no definite well-defined border at this time. No internal air. Previously suggested annular pancreas is not well evaluated on the current exam due to lack contrast. Spleen: Normal in size without focal abnormality. Adrenals/Urinary Tract: No adrenal nodule. No hydronephrosis. No significant perinephric edema. Cyst in the left kidney is not as well-defined on the current exam. Urinary bladder is partially distended, no bladder wall thickening. Stomach/Bowel: Ingested material in the stomach. Distended distal esophagus containing intraluminal contents. Detailed bowel assessment is limited in the absence of enteric and IV contrast. Fluid-filled normal caliber small bowel suggests ileus. A few small metallic densities persist in the cecum, previous densities in the distal small bowel however no longer seen. Findings suggest ingested material. Liquid stool in the proximal colon without colonic wall thickening or inflammatory change. Normal appendix. Vascular/Lymphatic: Normal caliber abdominal aorta. No evidence of retroperitoneal bleed, mild stranding about the bilateral psoas muscles felt to be secondary to pancreatic inflammation. Few prominent retroperitoneal and external iliac nodes are likely reactive. Reproductive: Prostate is unremarkable. Other: Small to moderate abdominopelvic ascites has increased from prior exam. No free air. Edema of bilateral anterior abdominal wall subcutaneous tissues, left greater than right. Musculoskeletal: There are no acute or suspicious osseous abnormalities. IMPRESSION: 1. Increased peripancreatic inflammatory change in fluid, fluid adjacent to the tail and anterior body may be organizing but no well-defined pseudocyst or internal air. Recommend attention to this at follow-up. 2. Increased abdominopelvic ascites, small bilateral pleural effusions. Progressive edema in bilateral lower anterior abdominal wall subcutaneous tissues, greater  on the left. 3. Distended stomach with ingested material in distal esophagus, delayed gastric emptying and or reflux. 4.  Hepatic steatosis. Electronically Signed   By: Jeb Levering M.D.   On: 12/30/2017 03:28   Dg Abd 1 View  Result Date: 12/29/2017 CLINICAL DATA:  Abdominal pain EXAM: ABDOMEN - 1 VIEW COMPARISON:  12/26/2017 FINDINGS: Scattered large and small bowel gas is noted. No abnormal mass or abnormal calcifications seen. No bony abnormality is seen. IMPRESSION: No acute abnormality noted. Electronically Signed   By: Inez Catalina M.D.   On: 12/29/2017 15:15   Dg Chest Port 1 View  Result Date: 12/30/2017 CLINICAL DATA:  Pneumonia, dyspnea EXAM: PORTABLE CHEST 1 VIEW COMPARISON:  12/28/2017 chest radiograph. FINDINGS: Low lung volumes. Stable cardiomediastinal silhouette with normal heart size. No pneumothorax. Possible stable trace left pleural effusion. No right pleural effusion. Hazy bibasilar lung opacities, stable in the left and minimally increased on the right. No pulmonary edema. IMPRESSION: Low lung volumes with hazy bibasilar lung opacities, stable on the left and minimally increased on the right, which could represent atelectasis, aspiration and/or pneumonia. Possible stable trace left pleural effusion. Electronically Signed   By: Ilona Sorrel M.D.   On: 12/30/2017 08:04    Medications: I have reviewed the patient's current medications.

## 2017-12-30 NOTE — Progress Notes (Signed)
Reviewed CT scan results with Dr. Trinda Pascal, it shows progressive edema in the bilateral lower anterior abdominal wall subcutaneous tissues greater on the left, likely contributing to patient's ecchymosis on left side.  We will continue to monitor.  No further intervention needed at this time.

## 2017-12-31 ENCOUNTER — Inpatient Hospital Stay (HOSPITAL_COMMUNITY): Payer: Self-pay

## 2017-12-31 DIAGNOSIS — K5900 Constipation, unspecified: Secondary | ICD-10-CM

## 2017-12-31 DIAGNOSIS — K8591 Acute pancreatitis with uninfected necrosis, unspecified: Secondary | ICD-10-CM

## 2017-12-31 DIAGNOSIS — Q451 Annular pancreas: Secondary | ICD-10-CM

## 2017-12-31 LAB — CBC WITH DIFFERENTIAL/PLATELET
Basophils Absolute: 0 10*3/uL (ref 0.0–0.1)
Basophils Relative: 0 %
Eosinophils Absolute: 0.4 10*3/uL (ref 0.0–0.7)
Eosinophils Relative: 3 %
HCT: 33.8 % — ABNORMAL LOW (ref 39.0–52.0)
Hemoglobin: 11.4 g/dL — ABNORMAL LOW (ref 13.0–17.0)
Lymphocytes Relative: 12 %
Lymphs Abs: 1.8 10*3/uL (ref 0.7–4.0)
MCH: 30 pg (ref 26.0–34.0)
MCHC: 33.7 g/dL (ref 30.0–36.0)
MCV: 88.9 fL (ref 78.0–100.0)
Monocytes Absolute: 2 10*3/uL — ABNORMAL HIGH (ref 0.1–1.0)
Monocytes Relative: 13 %
Neutro Abs: 10.9 10*3/uL — ABNORMAL HIGH (ref 1.7–7.7)
Neutrophils Relative %: 72 %
Platelets: 240 10*3/uL (ref 150–400)
RBC: 3.8 MIL/uL — ABNORMAL LOW (ref 4.22–5.81)
RDW: 14.7 % (ref 11.5–15.5)
WBC: 15 10*3/uL — ABNORMAL HIGH (ref 4.0–10.5)

## 2017-12-31 LAB — BASIC METABOLIC PANEL
Anion gap: 7 (ref 5–15)
BUN: 6 mg/dL (ref 6–20)
CO2: 28 mmol/L (ref 22–32)
Calcium: 7.8 mg/dL — ABNORMAL LOW (ref 8.9–10.3)
Chloride: 102 mmol/L (ref 98–111)
Creatinine, Ser: 0.82 mg/dL (ref 0.61–1.24)
GFR calc Af Amer: >60 mL/min (ref >60)
GFR calc non Af Amer: >60 mL/min (ref >60)
Glucose, Bld: 120 mg/dL — ABNORMAL HIGH (ref 70–99)
Potassium: 3.2 mmol/L — ABNORMAL LOW (ref 3.5–5.1)
Sodium: 137 mmol/L (ref 135–145)

## 2017-12-31 LAB — MAGNESIUM: Magnesium: 2 mg/dL (ref 1.7–2.4)

## 2017-12-31 MED ORDER — BISACODYL 5 MG PO TBEC: 5.0000 mg | DELAYED_RELEASE_TABLET | Freq: Every day | ORAL | Status: DC | PRN

## 2017-12-31 MED ORDER — SENNA 8.6 MG PO TABS
1.0000 | ORAL_TABLET | Freq: Two times a day (BID) | ORAL | Status: DC
  Administered 2017-12-31 – 2018-01-02 (×5): 8.6 mg via ORAL
  Filled 2017-12-31 (×6): qty 1

## 2017-12-31 MED ORDER — POTASSIUM CHLORIDE 10 MEQ/100ML IV SOLN
10.0000 meq | INTRAVENOUS | Status: AC
  Administered 2017-12-31 (×4): 10 meq via INTRAVENOUS
  Filled 2017-12-31 (×3): qty 100

## 2017-12-31 MED ORDER — POTASSIUM CHLORIDE CRYS ER 20 MEQ PO TBCR
40.0000 meq | EXTENDED_RELEASE_TABLET | Freq: Two times a day (BID) | ORAL | Status: AC
  Administered 2017-12-31 – 2018-01-01 (×2): 40 meq via ORAL
  Filled 2017-12-31 (×2): qty 2

## 2017-12-31 MED ORDER — BISACODYL 5 MG PO TBEC
5.0000 mg | DELAYED_RELEASE_TABLET | Freq: Every day | ORAL | Status: AC
  Administered 2017-12-31 – 2018-01-02 (×3): 5 mg via ORAL
  Filled 2017-12-31 (×3): qty 1

## 2017-12-31 NOTE — Progress Notes (Addendum)
PROGRESS NOTE    David Miranda  RSW:[id number]  DOB: 1985/09/25  DOA: 12/26/2017 PCP: Patient, No Pcp Per   Brief Admission Hx: Renne Platts a 32 y.o.malewith medical history significant ofalcohol abuse, presents with abdominal pain and diagnosed with acute pancreatitis, presumably related to chronic alcoholism. Patient's pain has increased over night. Patient is non-compliant with treatment plan and goals.   MDM/Assessment & Plan:   Hospital Problems: Active Problems:   Acute alcoholic pancreatitis   Alcohol abuse   Necrotizing pancreatitis   Leukocytosis   Abdominal pain, epigastric   Pancreatitis   Non-intractable vomiting with nausea   Hepatic steatosis  1. Acute pancreatis, presumably from acute on chronic alcoholism, he had mostly optimal lipid panel with normal triglycerides, continue supportive care. White count has increased this morning to 15 from 10.5. He has a low grade temp of 99.6 this morning. Dr. Oneida Alar changed pain medication regimen with dilaudid ATC and every 3 hours as needed. Flagyl discontinued by Dr. Oneida Alar. Will continue cefepime for coverage of pneumonia.    2. Abdominal pain - Pt now reports that pain involves the flank and LLQ area that radiates to back. CT does not suggest constipation. Suspect pain radiating to back is related to pancreatitis pain radiating to back. Status is unchanged. Will continue pain medication for coverage. 3. Fever - Resolved.  We ordered repeated blood cultures 7/27 with no growth to date.  Following.  Tylenol as needed. Patient had low grade temp this moring of 99.6.   4. Leukocytosis - had been trending down on antibiotics but uptrend seen today. chest xraysuggesting pneumonia. Alsoconcerned about necrotizing pancreatitis.Continue antibiotics.Follow blood cultures. No growth on cultures. What blood count has increased from 10.5 to 15.0. 5. Pneumonia - He is being treated with cefepime IV.  Chest x-ray unchanged from  previous. Will continue treatment with cefepime IV.  6. Chronic alcoholism - Pt has been placed on CIWA protocol for alcohol withdrawal. 7. Hypertension - He has had increasing blood pressures and I have started him on metoprolol 25 mg BID with better blood pressure control noted.Blood pressure this morning is mildly hypertensive at 133/89. 8. Sinus tachycardia - Much improved.  The patient had a fever of 100.4 last night with mild tachycardia, HR of 104. Patient has CIWA protocol in place. He left the floor twice last night because wanted to smoke. 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 He has been instructed not to leave the hospitals grounds without supervision. He was counseled again on why he can not leave the floor. His heart rate within normal limits in the upper 80's and low 90's. 9. Tobacco abuse - I have ordered and a larger dose of nicotine due to his addiction to smoking. He will get 28 mg of nicotine via one 21 mg patch and one 7 mg patch.He was counseled against using tobacco products.Patient states patches were not helping and were discontinued by GI. 10. Hypokalemia -oral and IV potassium ordered, replaced magnesium.Follow. 11. Hypomagnesemia - repleted.   DVT prophylaxis: Lovenox Code Status: FULL CODE Family Communication: Girl friend at bedside. Disposition Plan: Discharge home pending return to baseline and GI consult.   Consultants:  GI - Dr. Oneida Alar.  Antimicrobials:  Cefepime 7/25  Metronidazole 7/25-7/27   Subjective: Patient states increased pain that radiates to his back over night.  He continues to leave floor without permission/supervision.    Objective: Vitals:   12/30/17 0500 12/30/17 2000 12/30/17 2217 12/31/17 0534  BP: (!) 130/95  131/89  133/89  Pulse: 100  86 92  Resp: 18   18  Temp: 99.2 F (37.3 C)  99.6 F (37.6 C) 99.6 F (37.6 C)  TempSrc: Oral  Oral Oral  SpO2: 95% 93% 95% 96%  Weight:      Height:         Intake/Output Summary (Last 24 hours) at 12/31/2017 1023 Last data filed at 12/30/2017 1700 Gross per 24 hour  Intake 480 ml  Output -  Net 480 ml   Filed Weights   12/26/17 0910  Weight: 83.5 kg (184 lb)   REVIEW OF SYSTEMS  As per history otherwise all reviewed and reported negative  Exam:  General exam: Patient pacing in room in moderate amount of pain. Respiratory system: Clear. No increased work of breathing. Cardiovascular system: S1 & S2 heard, RRR. No JVD, murmurs, gallops, clicks or pedal edema. Gastrointestinal system: Abdomen is distended, firm and tender to palpation. Normal bowel sounds heard. Central nervous system: Alert and oriented. No focal neurological deficits. Extremities: no CCE.  Data Reviewed: Basic Metabolic Panel: Recent Labs  Lab 12/27/17 0429 12/28/17 0529 12/28/17 1113 12/29/17 0611 12/30/17 0332 12/31/17 0431  NA 140 141  --  137 134* 137  K 3.5 3.3*  --  3.0* 3.7 3.2*  CL 101 104  --  102 101 102  CO2 25 28  --  26 27 28   GLUCOSE 126* 108*  --  111* 101* 120*  BUN 8 12  --  9 9 6   CREATININE 0.99 0.97  --  0.76 0.73 0.82  CALCIUM 8.7* 8.0*  --  8.0* 7.7* 7.8*  MG  --   --  1.6* 2.4 2.1 2.0   Liver Function Tests: Recent Labs  Lab 12/26/17 0925 12/27/17 0429 12/28/17 0529 12/29/17 0611 12/30/17 0332  AST 53* 36 25 20 22   ALT 76* 49* 30 24 20   ALKPHOS 90 61 48 48 47  BILITOT 1.0 1.5* 1.7* 1.2 1.1  PROT 8.0 6.8 6.4* 6.4* 6.1*  ALBUMIN 4.4 3.6 3.0* 2.9* 2.8*   Recent Labs  Lab 12/26/17 0925 12/27/17 0429 12/28/17 0529 12/29/17 0611 12/30/17 0332  LIPASE 1,520* 1,063* 183* 72* 73*   No results for input(s): AMMONIA in the last 168 hours. CBC: Recent Labs  Lab 12/26/17 0925 12/27/17 0429 12/28/17 0529 12/29/17 0611 12/30/17 0332 12/31/17 0431  WBC 18.7* 26.0* 19.2* 13.6* 10.5 15.0*  NEUTROABS 14.2*  --  16.0* 10.5* 6.7 10.9*  HGB 17.8* 19.2* 16.4 14.3 12.0* 11.4*  HCT 49.5 54.8* 47.0 40.7 34.5* 33.8*  MCV  83.5 87.5 88.0 87.2 88.2 88.9  PLT 304 257 196 169 196 240   Cardiac Enzymes: No results for input(s): CKTOTAL, CKMB, CKMBINDEX, TROPONINI in the last 168 hours. CBG (last 3)  No results for input(s): GLUCAP in the last 72 hours. Recent Results (from the past 240 hour(s))  Culture, blood (Routine X 2) w Reflex to ID Panel     Status: None (Preliminary result)   Collection Time: 12/27/17 10:06 AM  Result Value Ref Range Status   Specimen Description BLOOD RIGHT ARM  Final   Special Requests   Final    BOTTLES DRAWN AEROBIC AND ANAEROBIC Blood Culture results may not be optimal due to an inadequate volume of blood received in culture bottles   Culture   Final    NO GROWTH 4 DAYS Performed at [facility], [address], [place], [place] [address]    Report Status PENDING  Incomplete  Culture, blood (Routine X 2) w Reflex to ID Panel     Status: None (Preliminary result)   Collection Time: 12/27/17 10:20 AM  Result Value Ref Range Status   Specimen Description BLOOD RIGHT ARM  Final   Special Requests   Final    BOTTLES DRAWN AEROBIC AND ANAEROBIC Blood Culture adequate volume   Culture   Final    NO GROWTH 4 DAYS Performed at [facility], [address], [place], [place] [address]    Report Status PENDING  Incomplete  Culture, blood (Routine X 2) w Reflex to ID Panel     Status: None (Preliminary result)   Collection Time: 12/29/17  4:58 PM  Result Value Ref Range Status   Specimen Description RIGHT ANTECUBITAL  Final   Special Requests   Final    BOTTLES DRAWN AEROBIC AND ANAEROBIC Blood Culture adequate volume   Culture   Final    NO GROWTH 2 DAYS Performed at [facility], [address], [place], [place] [address]    Report Status PENDING  Incomplete  Culture, blood (Routine X 2) w Reflex to ID Panel     Status: None (Preliminary result)   Collection Time: 12/29/17  4:59 PM  Result Value Ref Range Status   Specimen Description BLOOD RIGHT ARM  Final   Special Requests    Final    BOTTLES DRAWN AEROBIC AND ANAEROBIC Blood Culture adequate volume   Culture   Final    NO GROWTH 2 DAYS Performed at [facility], [address], [place], [place] [address]    Report Status PENDING  Incomplete     Studies: Ct Abdomen Pelvis Wo Contrast  Result Date: 12/30/2017 CLINICAL DATA:  Necrotizing pancreatitis with clinical deterioration. EXAM: CT ABDOMEN AND PELVIS WITHOUT CONTRAST TECHNIQUE: Multidetector CT imaging of the abdomen and pelvis was performed following the standard protocol without IV contrast. COMPARISON:  CT 12/26/2017 FINDINGS: Lower chest: Small bilateral pleural effusions, new from prior exam. Increased bilateral lower lobe atelectasis. Hepatobiliary: New high-density material in the gallbladder is likely vicarious excretion of IV contrast from prior exam. Decreased hepatic density consistent with steatosis. Low-density near the hepatic dome on prior exam is not well visualized in the absence of contrast. Pancreas: Progressive peripancreatic fat stranding and peripancreatic fluid. Fluid adjacent to the pancreatic tail, image 38 series 2, as well as anterior body, image 34 series 2, may be organizing but has no definite well-defined border at this time. No internal air. Previously suggested annular pancreas is not well evaluated on the current exam due to lack contrast. Spleen: Normal in size without focal abnormality. Adrenals/Urinary Tract: No adrenal nodule. No hydronephrosis. No significant perinephric edema. Cyst in the left kidney is not as well-defined on the current exam. Urinary bladder is partially distended, no bladder wall thickening. Stomach/Bowel: Ingested material in the stomach. Distended distal esophagus containing intraluminal contents. Detailed bowel assessment is limited in the absence of enteric and IV contrast. Fluid-filled normal caliber small bowel suggests ileus. A few small metallic densities persist in the cecum, previous densities in the  distal small bowel however no longer seen. Findings suggest ingested material. Liquid stool in the proximal colon without colonic wall thickening or inflammatory change. Normal appendix. Vascular/Lymphatic: Normal caliber abdominal aorta. No evidence of retroperitoneal bleed, mild stranding about the bilateral psoas muscles felt to be secondary to pancreatic inflammation. Few prominent retroperitoneal and external iliac nodes are likely reactive. Reproductive: Prostate is unremarkable. Other: Small to moderate abdominopelvic ascites has increased  from prior exam. No free air. Edema of bilateral anterior abdominal wall subcutaneous tissues, left greater than right. Musculoskeletal: There are no acute or suspicious osseous abnormalities. IMPRESSION: 1. Increased peripancreatic inflammatory change in fluid, fluid adjacent to the tail and anterior body may be organizing but no well-defined pseudocyst or internal air. Recommend attention to this at follow-up. 2. Increased abdominopelvic ascites, small bilateral pleural effusions. Progressive edema in bilateral lower anterior abdominal wall subcutaneous tissues, greater on the left. 3. Distended stomach with ingested material in distal esophagus, delayed gastric emptying and or reflux. 4. Hepatic steatosis. Electronically Signed   By: Jeb Levering M.D.   On: 12/30/2017 03:28   Dg Abd 1 View  Result Date: 12/29/2017 CLINICAL DATA:  Abdominal pain EXAM: ABDOMEN - 1 VIEW COMPARISON:  12/26/2017 FINDINGS: Scattered large and small bowel gas is noted. No abnormal mass or abnormal calcifications seen. No bony abnormality is seen. IMPRESSION: No acute abnormality noted. Electronically Signed   By: Inez Catalina M.D.   On: 12/29/2017 15:15   Dg Chest Port 1 View  Result Date: 12/31/2017 CLINICAL DATA:  32 year old male with a history of pneumonia EXAM: PORTABLE CHEST 1 VIEW COMPARISON:  12/30/2017, 12/28/2017, 12/27/2017 FINDINGS: Cardiomediastinal silhouette  unchanged. Low lung volumes accentuates the interstitium. Similar appearance of linear opacities at the lung bases with obscuration of the hemidiaphragm and heart borders. Blunting of left costophrenic angle. IMPRESSION: Similar appearance of the chest x-ray with low lung volumes and bibasilar opacities, potentially combination of atelectasis/consolidation and pleural fluid. Electronically Signed   By: Corrie Mckusick D.O.   On: 12/31/2017 07:28   Dg Chest Port 1 View  Result Date: 12/30/2017 CLINICAL DATA:  Pneumonia, dyspnea EXAM: PORTABLE CHEST 1 VIEW COMPARISON:  12/28/2017 chest radiograph. FINDINGS: Low lung volumes. Stable cardiomediastinal silhouette with normal heart size. No pneumothorax. Possible stable trace left pleural effusion. No right pleural effusion. Hazy bibasilar lung opacities, stable in the left and minimally increased on the right. No pulmonary edema. IMPRESSION: Low lung volumes with hazy bibasilar lung opacities, stable on the left and minimally increased on the right, which could represent atelectasis, aspiration and/or pneumonia. Possible stable trace left pleural effusion. Electronically Signed   By: Ilona Sorrel M.D.   On: 12/30/2017 08:04     Scheduled Meds: . enoxaparin (LOVENOX) injection  40 mg Subcutaneous Q24H  . folic acid  1 mg Oral Daily  .  HYDROmorphone (DILAUDID) injection  1 mg Intravenous Q6H  . metoprolol tartrate  25 mg Oral BID  . multivitamin with minerals  1 tablet Oral Daily  . ondansetron (ZOFRAN) IV  4 mg Intravenous TID WC & HS  . pantoprazole  40 mg Oral BID AC  . senna  1 tablet Oral Daily  . thiamine  100 mg Oral Daily   Continuous Infusions: . 0.9 % NaCl with KCl 20 mEq / L 50 mL/hr at 12/30/17 1918  . ceFEPime (MAXIPIME) IV Stopped (12/31/17 0419)  . potassium chloride 10 mEq (12/31/17 1015)    Active Problems:   Acute alcoholic pancreatitis   Alcohol abuse   Necrotizing pancreatitis   Leukocytosis   Abdominal pain, epigastric    Pancreatitis   Non-intractable vomiting with nausea   Hepatic steatosis   Time spent:   Ashok Norris, Student AGACNP  Attending:  Irwin Brakeman, MD, FAAFP [facility] Pager [phone]  If 7PM-7AM, please contact night-coverage [email or web address] Password [email or web address] 12/31/2017, 10:23 AM    LOS: 5 days

## 2017-12-31 NOTE — Progress Notes (Signed)
Subjective: Still having persistent pain. Pain in his abdomen during the day, more in his back at night. No N/V. Abdominal pain is generalized. Is fixated on pain medication states q 3 hour pain medication not as effective as when he was getting q 2 hour. States he has no desire to drink again. Stools initially diarrhea on admission, now feels constipated; daily bowel movements but stools are hard and require straining. Tolerating diet well, hamburger steak too hard but ate eggs and soft cookies without issue. No other GI complaints.  Objective: Vital signs in last 24 hours: Temp:  [99.6 F (37.6 C)] 99.6 F (37.6 C) (07/29 0534) Pulse Rate:  [86-92] 92 (07/29 0534) Resp:  [18] 18 (07/29 0534) BP: (131-133)/(89) 133/89 (07/29 0534) SpO2:  [93 %-96 %] 96 % (07/29 0534) Last BM Date: 12/30/17 General:   Alert and oriented, pleasant Head:  Normocephalic and atraumatic. Eyes:  No icterus, sclera clear. Conjuctiva pink.  Heart:  S1, S2 present, no murmurs noted.  Lungs: Clear to auscultation bilaterally, without wheezing, rales, or rhonchi.  Abdomen:  Abdomen soft. Generalized abdominal TTP, abdomen mildly distended but bowel sounds present. No HSM or hernias noted. No rebound or guarding. No masses appreciated  Msk:  Symmetrical without gross deformities. Normal posture. Pulses:  Normal bilateral DP pulses noted. Extremities:  Without clubbing or edema. Neurologic:  Alert and  oriented x4;  grossly normal neurologically. Skin:  Warm and dry, intact without significant lesions.  Psych:  Alert and cooperative. Normal mood and affect.  Intake/Output from previous day: 07/28 0701 - 07/29 0700 In: 720 [P.O.:720] Out: -  Intake/Output this shift: No intake/output data recorded.  Lab Results: Recent Labs    12/29/17 0611 12/30/17 0332 12/31/17 0431  WBC 13.6* 10.5 15.0*  HGB 14.3 12.0* 11.4*  HCT 40.7 34.5* 33.8*  PLT 169 196 240   BMET Recent Labs    12/29/17 0611  12/30/17 0332 12/31/17 0431  NA 137 134* 137  K 3.0* 3.7 3.2*  CL 102 101 102  CO2 26 27 28   GLUCOSE 111* 101* 120*  BUN 9 9 6   CREATININE 0.76 0.73 0.82  CALCIUM 8.0* 7.7* 7.8*   LFT Recent Labs    12/29/17 0611 12/30/17 0332  PROT 6.4* 6.1*  ALBUMIN 2.9* 2.8*  AST 20 22  ALT 24 20  ALKPHOS 48 47  BILITOT 1.2 1.1   PT/INR No results for input(s): LABPROT, INR in the last 72 hours. Hepatitis Panel No results for input(s): HEPBSAG, HCVAB, HEPAIGM, HEPBIGM in the last 72 hours.   Studies/Results: Ct Abdomen Pelvis Wo Contrast  Result Date: 12/30/2017 CLINICAL DATA:  Necrotizing pancreatitis with clinical deterioration. EXAM: CT ABDOMEN AND PELVIS WITHOUT CONTRAST TECHNIQUE: Multidetector CT imaging of the abdomen and pelvis was performed following the standard protocol without IV contrast. COMPARISON:  CT 12/26/2017 FINDINGS: Lower chest: Small bilateral pleural effusions, new from prior exam. Increased bilateral lower lobe atelectasis. Hepatobiliary: New high-density material in the gallbladder is likely vicarious excretion of IV contrast from prior exam. Decreased hepatic density consistent with steatosis. Low-density near the hepatic dome on prior exam is not well visualized in the absence of contrast. Pancreas: Progressive peripancreatic fat stranding and peripancreatic fluid. Fluid adjacent to the pancreatic tail, image 38 series 2, as well as anterior body, image 34 series 2, may be organizing but has no definite well-defined border at this time. No internal air. Previously suggested annular pancreas is not well evaluated on the current exam due  to lack contrast. Spleen: Normal in size without focal abnormality. Adrenals/Urinary Tract: No adrenal nodule. No hydronephrosis. No significant perinephric edema. Cyst in the left kidney is not as well-defined on the current exam. Urinary bladder is partially distended, no bladder wall thickening. Stomach/Bowel: Ingested material in the  stomach. Distended distal esophagus containing intraluminal contents. Detailed bowel assessment is limited in the absence of enteric and IV contrast. Fluid-filled normal caliber small bowel suggests ileus. A few small metallic densities persist in the cecum, previous densities in the distal small bowel however no longer seen. Findings suggest ingested material. Liquid stool in the proximal colon without colonic wall thickening or inflammatory change. Normal appendix. Vascular/Lymphatic: Normal caliber abdominal aorta. No evidence of retroperitoneal bleed, mild stranding about the bilateral psoas muscles felt to be secondary to pancreatic inflammation. Few prominent retroperitoneal and external iliac nodes are likely reactive. Reproductive: Prostate is unremarkable. Other: Small to moderate abdominopelvic ascites has increased from prior exam. No free air. Edema of bilateral anterior abdominal wall subcutaneous tissues, left greater than right. Musculoskeletal: There are no acute or suspicious osseous abnormalities. IMPRESSION: 1. Increased peripancreatic inflammatory change in fluid, fluid adjacent to the tail and anterior body may be organizing but no well-defined pseudocyst or internal air. Recommend attention to this at follow-up. 2. Increased abdominopelvic ascites, small bilateral pleural effusions. Progressive edema in bilateral lower anterior abdominal wall subcutaneous tissues, greater on the left. 3. Distended stomach with ingested material in distal esophagus, delayed gastric emptying and or reflux. 4. Hepatic steatosis. Electronically Signed   By: Jeb Levering M.D.   On: 12/30/2017 03:28   Dg Abd 1 View  Result Date: 12/29/2017 CLINICAL DATA:  Abdominal pain EXAM: ABDOMEN - 1 VIEW COMPARISON:  12/26/2017 FINDINGS: Scattered large and small bowel gas is noted. No abnormal mass or abnormal calcifications seen. No bony abnormality is seen. IMPRESSION: No acute abnormality noted. Electronically Signed    By: Inez Catalina M.D.   On: 12/29/2017 15:15   Dg Chest Port 1 View  Result Date: 12/31/2017 CLINICAL DATA:  32 year old male with a history of pneumonia EXAM: PORTABLE CHEST 1 VIEW COMPARISON:  12/30/2017, 12/28/2017, 12/27/2017 FINDINGS: Cardiomediastinal silhouette unchanged. Low lung volumes accentuates the interstitium. Similar appearance of linear opacities at the lung bases with obscuration of the hemidiaphragm and heart borders. Blunting of left costophrenic angle. IMPRESSION: Similar appearance of the chest x-ray with low lung volumes and bibasilar opacities, potentially combination of atelectasis/consolidation and pleural fluid. Electronically Signed   By: Corrie Mckusick D.O.   On: 12/31/2017 07:28   Dg Chest Port 1 View  Result Date: 12/30/2017 CLINICAL DATA:  Pneumonia, dyspnea EXAM: PORTABLE CHEST 1 VIEW COMPARISON:  12/28/2017 chest radiograph. FINDINGS: Low lung volumes. Stable cardiomediastinal silhouette with normal heart size. No pneumothorax. Possible stable trace left pleural effusion. No right pleural effusion. Hazy bibasilar lung opacities, stable in the left and minimally increased on the right. No pulmonary edema. IMPRESSION: Low lung volumes with hazy bibasilar lung opacities, stable on the left and minimally increased on the right, which could represent atelectasis, aspiration and/or pneumonia. Possible stable trace left pleural effusion. Electronically Signed   By: Ilona Sorrel M.D.   On: 12/30/2017 08:04    Assessment: 32 year old male with a history of significant alcohol intake as well as a history of alcoholism in his family.  Is also a smoker.  He drinks a significant amount, as per HPI.  He presented with abdominal pain, nausea, vomiting.  He is found to have acute  pancreatitis based on lipase of approximately 1500 and CT imaging showing consistent with uncomplicated pancreatitis.  No necrosis, abscess, pseudocyst noted. His nausea and vomiting have resolved.  His  abdominal pain is persistent but improved.  He was drinking clear liquids and this does not cause him more discomfort; he feels it actually helps. Subsequently he was advanced to a dysphagia III/soft diet. No biliary dilation, pancreatic ductal dilation, biliary abnormalities.  He does have consistent with hepatic steatosis on his liver imaging.  Overall it appears he is developing complicated pancreatitis with uncontrolled pain (Dilaudid q 3 hours prn). There was a question of hospital necrosis in the pancreas, noted annular pancreas.  And he may eventually need surgery to bypass the annulus if his symptoms are attributed to this.  CT scan was repeated due to worsening abdominal pain.  CT showed evolving inflammation with increased peripancreatic inflammatory changes and fluid in the fluid adjacent to the tail and anterior body may be organizing but no well-defined pseudocyst or internal air.  Lipase continues to improve and is normal today.  He is also complaining of constipation, likely from opioid pain medications.  MiraLAX is still listed on his medication list.  He has daily bowel movements with the stools are hard, small balls with straining.  Is complaining that his pain is not well managed.  He was getting Dilaudid every 2 hours and now he is getting it every 3 hours.  He denies any nausea or vomiting.  Plan: 1. D/C MiraLAX 2. Add Dulcolax 5 mg daily x 3 days 3. Continue current diet 4. Will defer pain management to the hospitalist at this time; may need increase in Dilaudid frequency per patient complaints 5. Monitor closely for any qorsening pancreatitis 6. Supportive measures   Thank you for allowing [place] to participate in the care of [facility]  Walden Field, DNP, AGNP-C Adult & Gerontological Nurse Practitioner [facility]     LOS: 5 days    12/31/2017, 8:09 AM

## 2017-12-31 NOTE — Progress Notes (Signed)
Patient went off the floor with his girlfriend at this time, Security called.

## 2018-01-01 LAB — COMPREHENSIVE METABOLIC PANEL
ALT: 30 U/L (ref 0–44)
AST: 35 U/L (ref 15–41)
Albumin: 3 g/dL — ABNORMAL LOW (ref 3.5–5.0)
Alkaline Phosphatase: 101 U/L (ref 38–126)
Anion gap: 10 (ref 5–15)
BUN: 5 mg/dL — ABNORMAL LOW (ref 6–20)
CO2: 28 mmol/L (ref 22–32)
Calcium: 8.4 mg/dL — ABNORMAL LOW (ref 8.9–10.3)
Chloride: 101 mmol/L (ref 98–111)
Creatinine, Ser: 0.73 mg/dL (ref 0.61–1.24)
GFR calc Af Amer: >60 mL/min (ref >60)
GFR calc non Af Amer: >60 mL/min (ref >60)
Glucose, Bld: 111 mg/dL — ABNORMAL HIGH (ref 70–99)
Potassium: 3.3 mmol/L — ABNORMAL LOW (ref 3.5–5.1)
Sodium: 139 mmol/L (ref 135–145)
Total Bilirubin: 0.9 mg/dL (ref 0.3–1.2)
Total Protein: 6.7 g/dL (ref 6.5–8.1)

## 2018-01-01 LAB — CULTURE, BLOOD (ROUTINE X 2)
Culture: NO GROWTH
Culture: NO GROWTH
Special Requests: ADEQUATE

## 2018-01-01 LAB — CBC WITH DIFFERENTIAL/PLATELET
Basophils Absolute: 0 10*3/uL (ref 0.0–0.1)
Basophils Relative: 0 %
Eosinophils Absolute: 0.4 10*3/uL (ref 0.0–0.7)
Eosinophils Relative: 2 %
HCT: 35.8 % — ABNORMAL LOW (ref 39.0–52.0)
Hemoglobin: 12 g/dL — ABNORMAL LOW (ref 13.0–17.0)
Lymphocytes Relative: 15 %
Lymphs Abs: 2.5 10*3/uL (ref 0.7–4.0)
MCH: 29.9 pg (ref 26.0–34.0)
MCHC: 33.5 g/dL (ref 30.0–36.0)
MCV: 89.1 fL (ref 78.0–100.0)
Monocytes Absolute: 2.1 10*3/uL — ABNORMAL HIGH (ref 0.1–1.0)
Monocytes Relative: 13 %
Neutro Abs: 11.8 10*3/uL — ABNORMAL HIGH (ref 1.7–7.7)
Neutrophils Relative %: 70 %
Platelets: 325 10*3/uL (ref 150–400)
RBC: 4.02 MIL/uL — ABNORMAL LOW (ref 4.22–5.81)
RDW: 14.8 % (ref 11.5–15.5)
WBC: 16.8 10*3/uL — ABNORMAL HIGH (ref 4.0–10.5)

## 2018-01-01 LAB — MAGNESIUM: Magnesium: 2 mg/dL (ref 1.7–2.4)

## 2018-01-01 MED ORDER — OXYCODONE HCL 5 MG PO TABS
10.0000 mg | ORAL_TABLET | Freq: Four times a day (QID) | ORAL | Status: DC
  Administered 2018-01-01 – 2018-01-03 (×9): 10 mg via ORAL
  Filled 2018-01-01 (×9): qty 2

## 2018-01-01 MED ORDER — MEROPENEM 1 G IV SOLR
1.0000 g | Freq: Three times a day (TID) | INTRAVENOUS | Status: DC
  Administered 2018-01-01 – 2018-01-03 (×6): 1 g via INTRAVENOUS
  Filled 2018-01-01 (×9): qty 1

## 2018-01-01 MED ORDER — POTASSIUM CHLORIDE CRYS ER 20 MEQ PO TBCR
40.0000 meq | EXTENDED_RELEASE_TABLET | Freq: Two times a day (BID) | ORAL | Status: AC
  Administered 2018-01-01 – 2018-01-02 (×2): 40 meq via ORAL
  Filled 2018-01-01 (×2): qty 2

## 2018-01-01 MED ORDER — OXYCODONE-ACETAMINOPHEN 5-325 MG PO TABS: 2.0000 | ORAL_TABLET | Freq: Four times a day (QID) | ORAL | Status: DC

## 2018-01-01 MED ORDER — AMITRIPTYLINE HCL 25 MG PO TABS
25.0000 mg | ORAL_TABLET | Freq: Every day | ORAL | Status: DC
  Administered 2018-01-01 – 2018-01-02 (×2): 25 mg via ORAL
  Filled 2018-01-01 (×2): qty 1

## 2018-01-01 NOTE — Care Management Note (Signed)
Case Management Note  Patient Details  Name: David Miranda MRN: [id number] Date of Birth: 04/23/1986  If discussed at [facility] Length of Stay Meetings, dates discussed:  01/01/2018  Additional Comments:  Latacha Texeira, Chauncey Reading, RN 01/01/2018, 12:27 PM

## 2018-01-01 NOTE — Progress Notes (Signed)
    Subjective: Hard to sleep at night due to back discomfort. Pain improved from admission but still persists and less controlled during evening. BM yesterday. No N/V. Tolerating diet. Tmax 101.6 overnight. No shortness of breath or cough.   Objective: Vital signs in last 24 hours: Temp:  [99.3 F (37.4 C)-101.6 F (38.7 C)] 99.3 F (37.4 C) (07/30 0625) Pulse Rate:  [74-94] 77 (07/30 0625) Resp:  [18] 18 (07/30 0625) BP: (109-145)/(84-101) 109/84 (07/30 0625) SpO2:  [95 %-98 %] 95 % (07/30 0625) Last BM Date: 12/31/17 General:   Alert and oriented, pleasant, does not appear in distress.  Abdomen:  Bowel sounds hypoactive but present, soft, TTP LUQ/LLQ mildly  Msk:  Symmetrical without gross deformities. Normal posture. Extremities:  Without edema. Neurologic:  Alert and  oriented x4 Psych:  Alert and cooperative. Normal mood and affect.  Intake/Output from previous day: 07/29 0701 - 07/30 0700 In: 4378.8 [P.O.:740; I.V.:2885.5; IV Piggyback:753.3] Out: -  Intake/Output this shift: No intake/output data recorded.  Lab Results: Recent Labs    12/30/17 0332 12/31/17 0431 01/01/18 0418  WBC 10.5 15.0* 16.8*  HGB 12.0* 11.4* 12.0*  HCT 34.5* 33.8* 35.8*  PLT 196 240 325   BMET Recent Labs    12/30/17 0332 12/31/17 0431 01/01/18 0418  NA 134* 137 139  K 3.7 3.2* 3.3*  CL 101 102 101  CO2 27 28 28   GLUCOSE 101* 120* 111*  BUN 9 6 5*  CREATININE 0.73 0.82 0.73  CALCIUM 7.7* 7.8* 8.4*   LFT Recent Labs    12/30/17 0332 01/01/18 0418  PROT 6.1* 6.7  ALBUMIN 2.8* 3.0*  AST 22 35  ALT 20 30  ALKPHOS 47 101  BILITOT 1.1 0.9   Lab Results  Component Value Date   LIPASE 73 (H) 12/30/2017     Studies/Results: Dg Chest Port 1 View  Result Date: 12/31/2017 CLINICAL DATA:  32 year old male with a history of pneumonia EXAM: PORTABLE CHEST 1 VIEW COMPARISON:  12/30/2017, 12/28/2017, 12/27/2017 FINDINGS: Cardiomediastinal silhouette unchanged. Low lung volumes  accentuates the interstitium. Similar appearance of linear opacities at the lung bases with obscuration of the hemidiaphragm and heart borders. Blunting of left costophrenic angle. IMPRESSION: Similar appearance of the chest x-ray with low lung volumes and bibasilar opacities, potentially combination of atelectasis/consolidation and pleural fluid. Electronically Signed   By: Corrie Mckusick D.O.   On: 12/31/2017 07:28    Assessment: 32 year old male admitted with acute pancreatitis, history of alcohol, repeat CT 7/28 without contrast with increased peripancreatic inflammatory change but no well-defined pseudocyst. Initial CT with suggestion of possible annular pancreas and possible necrosis after review with radiology as noted in initial consultation note 12/28/17. 101.7 Tmax on 7.27, with Tmax 101.6 overnight. Afebrile currently. Initial WBC count on presentation 26 and had been improving, now with slight increase from yesterday. Clinically, patient's pain is improved from admission, although he is requesting more frequent dosing. Tolerating diet. Discussed repeat imaging if increasing WBC count, persistent fever, worsening abdominal pain.     Plan: Continue soft diet Continue potassium repletion Monitor for changes in clinical status, recurrent fever, worsening abdominal pain PPI BID Continue pain control measures  Annitta Needs, PhD, ANP-BC [facility]      LOS: 6 days    01/01/2018, 8:24 AM

## 2018-01-01 NOTE — Progress Notes (Signed)
PROGRESS NOTE    David Miranda  MCN:[id number]  DOB: 04-22-1986  DOA: 12/26/2017 PCP: Patient, No Pcp Per   Brief Admission Hx: David Miranda a 32 y.o.malewith medical history significant ofalcohol abuse, presents with abdominal pain and diagnosed with acute pancreatitis, presumably related to chronic alcoholism.   He is being treated supportively with pancreatitis, but continues to have pain.  He has fever and leukocytosis.  Currently on IV antibiotics.   MDM/Assessment & Plan:   Hospital Problems: Active Problems:   Acute alcoholic pancreatitis   Alcohol abuse   Necrotizing pancreatitis   Leukocytosis   Abdominal pain, epigastric   Pancreatitis   Non-intractable vomiting with nausea   Hepatic steatosis   Constipation   Annular pancreas  1. Acute pancreatis, presumably from acute on chronic alcoholism, he had mostly optimal lipid panel with normal triglycerides, continue supportive care.  White count continues to increase and he has been having fevers up to 101F.  Continue pain management and IV fluids.  Discussed with Dr. Oneida Alar and will change antibiotics from cefepime to meropenem.  If he continues to have fevers and pain, he will need repeat CT imaging.    2. Abdominal pain -patient reports continued pain across his abdomen.  He has been receiving IV narcotics.  Clinically, patient's pain appear to be out of proportion to his clinical findings.  Although he says he is in significant pain, he does not have any objective evidence of distress.  Will transition patient's pain medication from IV Dilaudid to oral oxycodone.  We will try to wean off Dilaudid.  3. Leukocytosis -initially trending down, but over the past 48 hours has began to trend back up.  Clinically, he does not appear to be septic.  No evidence of pneumonia.  Continue to follow. 4. Pneumonia -he was treated with IV cefepime.  Is completed approximately 6 days of this.  He does not have any cough or shortness of  breath. 5. Chronic alcoholism - Pt has been placed on CIWA protocol for alcohol withdrawal. 6. Hypertension - He has had increasing blood pressures and I have started him on metoprolol 25 mg BID with better blood pressure control noted.Blood pressure this morning is mildly hypertensive at 133/89. 7. Tobacco abuse - on nicotine patch 8. Hypokalemia -this is being replaced. 9. Hypomagnesemia - repleted.   DVT prophylaxis: Lovenox Code Status: FULL CODE Family Communication: Father at bedside Disposition Plan: Discharge home pending return to baseline and GI consult.   Consultants:  GI - Dr. Oneida Alar.  Antimicrobials:  Cefepime 7/25>7/30  Metronidazole 7/25-7/27  Meropenem 7/30>   Subjective: Continues to have pain across her abdomen and some in his lower back.  No vomiting.  Objective: Vitals:   12/31/17 1516 12/31/17 2159 01/01/18 0625 01/01/18 1510  BP: 124/89 (!) 145/101 109/84 (!) 142/91  Pulse: 74 94 77 70  Resp:  18 18   Temp: 99.5 F (37.5 C) (!) 101.6 F (38.7 C) 99.3 F (37.4 C) 99.7 F (37.6 C)  TempSrc: Oral Oral Oral Oral  SpO2: 98% 96% 95% 97%  Weight:      Height:        Intake/Output Summary (Last 24 hours) at 01/01/2018 2002 Last data filed at 01/01/2018 1510 Gross per 24 hour  Intake 1729.16 ml  Output -  Net 1729.16 ml   Filed Weights   12/26/17 0910  Weight: 83.5 kg (184 lb)   REVIEW OF SYSTEMS  As per history otherwise all reviewed and reported negative  Exam:  General exam: Alert, awake, oriented x 3 Respiratory system: Clear to auscultation. Respiratory effort normal. Cardiovascular system:RRR. No murmurs, rubs, gallops. Gastrointestinal system: Abdomen is nondistended, soft and nontender. No organomegaly or masses felt. Normal bowel sounds heard. Central nervous system: Alert and oriented. No focal neurological deficits. Extremities: No C/C/E, +pedal pulses Skin: No rashes, lesions or ulcers Psychiatry: Judgement and insight  appear normal. Mood & affect appropriate.    Data Reviewed: Basic Metabolic Panel: Recent Labs  Lab 12/28/17 0529 12/28/17 1113 12/29/17 0611 12/30/17 0332 12/31/17 0431 01/01/18 0418  NA 141  --  137 134* 137 139  K 3.3*  --  3.0* 3.7 3.2* 3.3*  CL 104  --  102 101 102 101  CO2 28  --  26 27 28 28   GLUCOSE 108*  --  111* 101* 120* 111*  BUN 12  --  9 9 6  5*  CREATININE 0.97  --  0.76 0.73 0.82 0.73  CALCIUM 8.0*  --  8.0* 7.7* 7.8* 8.4*  MG  --  1.6* 2.4 2.1 2.0 2.0   Liver Function Tests: Recent Labs  Lab 12/27/17 0429 12/28/17 0529 12/29/17 0611 12/30/17 0332 01/01/18 0418  AST 36 25 20 22  35  ALT 49* 30 24 20 30   ALKPHOS 61 48 48 47 101  BILITOT 1.5* 1.7* 1.2 1.1 0.9  PROT 6.8 6.4* 6.4* 6.1* 6.7  ALBUMIN 3.6 3.0* 2.9* 2.8* 3.0*   Recent Labs  Lab 12/26/17 0925 12/27/17 0429 12/28/17 0529 12/29/17 0611 12/30/17 0332  LIPASE 1,520* 1,063* 183* 72* 73*   No results for input(s): AMMONIA in the last 168 hours. CBC: Recent Labs  Lab 12/28/17 0529 12/29/17 0611 12/30/17 0332 12/31/17 0431 01/01/18 0418  WBC 19.2* 13.6* 10.5 15.0* 16.8*  NEUTROABS 16.0* 10.5* 6.7 10.9* 11.8*  HGB 16.4 14.3 12.0* 11.4* 12.0*  HCT 47.0 40.7 34.5* 33.8* 35.8*  MCV 88.0 87.2 88.2 88.9 89.1  PLT 196 169 196 240 325   Cardiac Enzymes: No results for input(s): CKTOTAL, CKMB, CKMBINDEX, TROPONINI in the last 168 hours. CBG (last 3)  No results for input(s): GLUCAP in the last 72 hours. Recent Results (from the past 240 hour(s))  Culture, blood (Routine X 2) w Reflex to ID Panel     Status: None   Collection Time: 12/27/17 10:06 AM  Result Value Ref Range Status   Specimen Description BLOOD RIGHT ARM  Final   Special Requests   Final    BOTTLES DRAWN AEROBIC AND ANAEROBIC Blood Culture results may not be optimal due to an inadequate volume of blood received in culture bottles   Culture   Final    NO GROWTH 5 DAYS Performed at [facility], [address],  [place], [place] [address]    Report Status 01/01/2018 FINAL  Final  Culture, blood (Routine X 2) w Reflex to ID Panel     Status: None   Collection Time: 12/27/17 10:20 AM  Result Value Ref Range Status   Specimen Description BLOOD RIGHT ARM  Final   Special Requests   Final    BOTTLES DRAWN AEROBIC AND ANAEROBIC Blood Culture adequate volume   Culture   Final    NO GROWTH 5 DAYS Performed at [facility], [address] [place],  [address]    Report Status 01/01/2018 FINAL  Final  Culture, blood (Routine X 2) w Reflex to ID Panel     Status: None (Preliminary result)   Collection Time: 12/29/17  4:58 PM  Result Value Ref Range Status   Specimen Description RIGHT ANTECUBITAL  Final   Special Requests   Final    BOTTLES DRAWN AEROBIC AND ANAEROBIC Blood Culture adequate volume   Culture   Final    NO GROWTH 3 DAYS Performed at [facility], [address], [place], [place] [address]    Report Status PENDING  Incomplete  Culture, blood (Routine X 2) w Reflex to ID Panel     Status: None (Preliminary result)   Collection Time: 12/29/17  4:59 PM  Result Value Ref Range Status   Specimen Description BLOOD RIGHT ARM  Final   Special Requests   Final    BOTTLES DRAWN AEROBIC AND ANAEROBIC Blood Culture adequate volume   Culture   Final    NO GROWTH 3 DAYS Performed at [facility], [address]  [place], [place] [address]    Report Status PENDING  Incomplete     Studies: Dg Chest Port 1 View  Result Date: 12/31/2017 CLINICAL DATA:  32 year old male with a history of pneumonia EXAM: PORTABLE CHEST 1 VIEW COMPARISON:  12/30/2017, 12/28/2017, 12/27/2017 FINDINGS: Cardiomediastinal silhouette unchanged. Low lung volumes accentuates the interstitium. Similar appearance of linear opacities at the lung bases with obscuration of the hemidiaphragm and heart borders. Blunting of left costophrenic angle. IMPRESSION: Similar appearance of the chest x-ray with low lung volumes and bibasilar  opacities, potentially combination of atelectasis/consolidation and pleural fluid. Electronically Signed   By: Corrie Mckusick D.O.   On: 12/31/2017 07:28     Scheduled Meds: . amitriptyline  25 mg Oral QHS  . bisacodyl  5 mg Oral Daily  . enoxaparin (LOVENOX) injection  40 mg Subcutaneous Q24H  . folic acid  1 mg Oral Daily  . metoprolol tartrate  25 mg Oral BID  . multivitamin with minerals  1 tablet Oral Daily  . ondansetron (ZOFRAN) IV  4 mg Intravenous TID WC & HS  . oxyCODONE  10 mg Oral QID  . pantoprazole  40 mg Oral BID AC  . potassium chloride  40 mEq Oral BID  . senna  1 tablet Oral BID WC  . thiamine  100 mg Oral Daily   Continuous Infusions: . 0.9 % NaCl with KCl 20 mEq / L 50 mL/hr at 01/01/18 1634  . meropenem (MERREM) IV Stopped (01/01/18 1510)    Active Problems:   Acute alcoholic pancreatitis   Alcohol abuse   Necrotizing pancreatitis   Leukocytosis   Abdominal pain, epigastric   Pancreatitis   Non-intractable vomiting with nausea   Hepatic steatosis   Constipation   Annular pancreas   Time spent: 45mins  Kathie Dike, MD [facility] Pager [phone]  If 7PM-7AM, please contact night-coverage [email or web address] Password [email or web address] 01/01/2018, 8:02 PM    LOS: 6 days

## 2018-01-02 LAB — CBC WITH DIFFERENTIAL/PLATELET
Basophils Absolute: 0 10*3/uL (ref 0.0–0.1)
Basophils Relative: 0 %
Eosinophils Absolute: 0.4 10*3/uL (ref 0.0–0.7)
Eosinophils Relative: 3 %
HCT: 34.5 % — ABNORMAL LOW (ref 39.0–52.0)
Hemoglobin: 11.8 g/dL — ABNORMAL LOW (ref 13.0–17.0)
Lymphocytes Relative: 14 %
Lymphs Abs: 2.2 10*3/uL (ref 0.7–4.0)
MCH: 30.3 pg (ref 26.0–34.0)
MCHC: 34.2 g/dL (ref 30.0–36.0)
MCV: 88.5 fL (ref 78.0–100.0)
Monocytes Absolute: 2 10*3/uL — ABNORMAL HIGH (ref 0.1–1.0)
Monocytes Relative: 13 %
Neutro Abs: 10.9 10*3/uL — ABNORMAL HIGH (ref 1.7–7.7)
Neutrophils Relative %: 70 %
Platelets: 369 10*3/uL (ref 150–400)
RBC: 3.9 MIL/uL — ABNORMAL LOW (ref 4.22–5.81)
RDW: 14.5 % (ref 11.5–15.5)
WBC: 15.5 10*3/uL — ABNORMAL HIGH (ref 4.0–10.5)

## 2018-01-02 LAB — LIPASE, BLOOD: Lipase: 93 U/L — ABNORMAL HIGH (ref 11–51)

## 2018-01-02 LAB — BASIC METABOLIC PANEL
Anion gap: 9 (ref 5–15)
BUN: 5 mg/dL — ABNORMAL LOW (ref 6–20)
CO2: 27 mmol/L (ref 22–32)
Calcium: 8.4 mg/dL — ABNORMAL LOW (ref 8.9–10.3)
Chloride: 101 mmol/L (ref 98–111)
Creatinine, Ser: 0.72 mg/dL (ref 0.61–1.24)
GFR calc Af Amer: >60 mL/min (ref >60)
GFR calc non Af Amer: >60 mL/min (ref >60)
Glucose, Bld: 120 mg/dL — ABNORMAL HIGH (ref 70–99)
Potassium: 3.8 mmol/L (ref 3.5–5.1)
Sodium: 137 mmol/L (ref 135–145)

## 2018-01-02 LAB — CREATININE, SERUM
Creatinine, Ser: 0.71 mg/dL (ref 0.61–1.24)
GFR calc Af Amer: >60 mL/min (ref >60)
GFR calc non Af Amer: >60 mL/min (ref >60)

## 2018-01-02 MED ORDER — POTASSIUM CHLORIDE CRYS ER 20 MEQ PO TBCR
40.0000 meq | EXTENDED_RELEASE_TABLET | Freq: Every day | ORAL | Status: DC
  Administered 2018-01-03: 40 meq via ORAL
  Filled 2018-01-02: qty 2

## 2018-01-02 NOTE — Progress Notes (Signed)
PROGRESS NOTE    Oskar Cretella  NWG:[id number]  DOB: 11-12-1985  DOA: 12/26/2017 PCP: Patient, No Pcp Per   Brief Admission Hx: Khalfani Weideman a 32 y.o.malewith medical history significant ofalcohol abuse, presents with abdominal pain and diagnosed with acute pancreatitis, presumably related to chronic alcoholism.   He is being treated supportively with pancreatitis, but continues to have pain.  He has fever and leukocytosis.  Currently on IV antibiotics.   MDM/Assessment & Plan:   Hospital Problems: Active Problems:   Acute alcoholic pancreatitis   Alcohol abuse   Necrotizing pancreatitis   Leukocytosis   Abdominal pain, epigastric   Pancreatitis   Non-intractable vomiting with nausea   Hepatic steatosis   Constipation   Annular pancreas  1. Acute pancreatis, presumably from acute on chronic alcoholism, he had mostly optimal lipid panel with normal triglycerides, continue supportive care.  White count remained elevated and he was having fevers up to 101F.  Exchange to meropenem.  Since then fevers appear to have resolved.  Discussed with Dr. Oneida Alar, she will order a repeat CT abdomen.  If this is unrevealing, can consider discharge home with oral antibiotics   2. Abdominal pain -patient reports continued pain across his abdomen.  He has been receiving IV narcotics.  He is being transitioned to oral medications.  Started on Elavil in addition to oxycodone.  3. Leukocytosis -initially trending down, but over the past 48 hours has began to trend back up.  Clinically, he does not appear to be septic.  No evidence of pneumonia.  Continue to follow. 4. Pneumonia -he was treated with IV cefepime.  Is completed approximately 6 days of this.  He does not have any cough or shortness of breath. 5. Chronic alcoholism - Pt has been placed on CIWA protocol for alcohol withdrawal. No signs of withdrawal at this time 6. Hypertension - He has had increasing blood pressures and I have started him  on metoprolol 25 mg BID with better blood pressure control noted.Blood pressure this morning is mildly hypertensive at 133/89. 7. Tobacco abuse - on nicotine patch 8. Hypokalemia -this is being replaced. 9. Hypomagnesemia - repleted.   DVT prophylaxis: Lovenox Code Status: FULL CODE Family Communication: Girlfriend at bedside Disposition Plan: Discharge home pending return to baseline and GI consult.   Consultants:  GI - Dr. Oneida Alar.  Antimicrobials:  Cefepime 7/25>7/30  Metronidazole 7/25-7/27  Meropenem 7/30>   Subjective: Continues to have some abdominal pain.  Appears to be tolerating diet.  No fevers overnight  Objective: Vitals:   01/02/18 0517 01/02/18 1200 01/02/18 1432 01/02/18 1753  BP: (!) 136/101 131/88 120/80 126/83  Pulse: 75 67 72 86  Resp: (!) 22  18   Temp: 98.8 F (37.1 C)  98.9 F (37.2 C)   TempSrc: Oral  Oral   SpO2: 99%  98%   Weight:      Height:        Intake/Output Summary (Last 24 hours) at 01/02/2018 1858 Last data filed at 01/02/2018 1800 Gross per 24 hour  Intake 720 ml  Output -  Net 720 ml   Filed Weights   12/26/17 0910  Weight: 83.5 kg (184 lb)   REVIEW OF SYSTEMS  As per history otherwise all reviewed and reported negative  Exam:  General exam: Alert, awake, oriented x 3 Respiratory system: Clear to auscultation. Respiratory effort normal. Cardiovascular system:RRR. No murmurs, rubs, gallops. Gastrointestinal system: Abdomen is nondistended, soft and tender in right upper quadrant. No organomegaly or masses  felt. Normal bowel sounds heard. Central nervous system: Alert and oriented. No focal neurological deficits. Extremities: No C/C/E, +pedal pulses Skin: No rashes, lesions or ulcers Psychiatry: Judgement and insight appear normal. Mood & affect appropriate.   Data Reviewed: Basic Metabolic Panel: Recent Labs  Lab 12/28/17 1113 12/29/17 0611 12/30/17 0332 12/31/17 0431 01/01/18 0418 01/02/18 0359  NA  --   137 134* 137 139 137  K  --  3.0* 3.7 3.2* 3.3* 3.8  CL  --  102 101 102 101 101  CO2  --  26 27 28 28 27   GLUCOSE  --  111* 101* 120* 111* 120*  BUN  --  9 9 6  5* 5*  CREATININE  --  0.76 0.73 0.82 0.73 0.72  0.71  CALCIUM  --  8.0* 7.7* 7.8* 8.4* 8.4*  MG 1.6* 2.4 2.1 2.0 2.0  --    Liver Function Tests: Recent Labs  Lab 12/27/17 0429 12/28/17 0529 12/29/17 0611 12/30/17 0332 01/01/18 0418  AST 36 25 20 22  35  ALT 49* 30 24 20 30   ALKPHOS 61 48 48 47 101  BILITOT 1.5* 1.7* 1.2 1.1 0.9  PROT 6.8 6.4* 6.4* 6.1* 6.7  ALBUMIN 3.6 3.0* 2.9* 2.8* 3.0*   Recent Labs  Lab 12/27/17 0429 12/28/17 0529 12/29/17 0611 12/30/17 0332 01/02/18 0359  LIPASE 1,063* 183* 72* 73* 93*   No results for input(s): AMMONIA in the last 168 hours. CBC: Recent Labs  Lab 12/29/17 0611 12/30/17 0332 12/31/17 0431 01/01/18 0418 01/02/18 0359  WBC 13.6* 10.5 15.0* 16.8* 15.5*  NEUTROABS 10.5* 6.7 10.9* 11.8* 10.9*  HGB 14.3 12.0* 11.4* 12.0* 11.8*  HCT 40.7 34.5* 33.8* 35.8* 34.5*  MCV 87.2 88.2 88.9 89.1 88.5  PLT 169 196 240 325 369   Cardiac Enzymes: No results for input(s): CKTOTAL, CKMB, CKMBINDEX, TROPONINI in the last 168 hours. CBG (last 3)  No results for input(s): GLUCAP in the last 72 hours. Recent Results (from the past 240 hour(s))  Culture, blood (Routine X 2) w Reflex to ID Panel     Status: None   Collection Time: 12/27/17 10:06 AM  Result Value Ref Range Status   Specimen Description BLOOD RIGHT ARM  Final   Special Requests   Final    BOTTLES DRAWN AEROBIC AND ANAEROBIC Blood Culture results may not be optimal due to an inadequate volume of blood received in culture bottles   Culture   Final    NO GROWTH 5 DAYS Performed at [facility], [address] [place], [place] [address]    Report Status 01/01/2018 FINAL  Final  Culture, blood (Routine X 2) w Reflex to ID Panel     Status: None   Collection Time: 12/27/17 10:20 AM  Result Value Ref Range Status    Specimen Description BLOOD RIGHT ARM  Final   Special Requests   Final    BOTTLES DRAWN AEROBIC AND ANAEROBIC Blood Culture adequate volume   Culture   Final    NO GROWTH 5 DAYS Performed at [facility], [address] [place],  [address]    Report Status 01/01/2018 FINAL  Final  Culture, blood (Routine X 2) w Reflex to ID Panel     Status: None (Preliminary result)   Collection Time: 12/29/17  4:58 PM  Result Value Ref Range Status   Specimen Description RIGHT ANTECUBITAL  Final   Special Requests   Final    BOTTLES DRAWN AEROBIC AND ANAEROBIC Blood Culture adequate volume  Culture   Final    NO GROWTH 4 DAYS Performed at [facility], [address] [place], [place] [address]    Report Status PENDING  Incomplete  Culture, blood (Routine X 2) w Reflex to ID Panel     Status: None (Preliminary result)   Collection Time: 12/29/17  4:59 PM  Result Value Ref Range Status   Specimen Description BLOOD RIGHT ARM  Final   Special Requests   Final    BOTTLES DRAWN AEROBIC AND ANAEROBIC Blood Culture adequate volume   Culture   Final    NO GROWTH 4 DAYS Performed at [facility], [address] [place], [place] [address]    Report Status PENDING  Incomplete     Studies: No results found.   Scheduled Meds: . amitriptyline  25 mg Oral QHS  . enoxaparin (LOVENOX) injection  40 mg Subcutaneous Q24H  . folic acid  1 mg Oral Daily  . metoprolol tartrate  25 mg Oral BID  . multivitamin with minerals  1 tablet Oral Daily  . ondansetron (ZOFRAN) IV  4 mg Intravenous TID WC & HS  . oxyCODONE  10 mg Oral QID  . pantoprazole  40 mg Oral BID AC  . [START ON 01/03/2018] potassium chloride  40 mEq Oral Q breakfast  . senna  1 tablet Oral BID WC  . thiamine  100 mg Oral Daily   Continuous Infusions: . 0.9 % NaCl with KCl 20 mEq / L 50 mL/hr at 01/01/18 2330  . meropenem (MERREM) IV Stopped (01/02/18 1556)    Active Problems:   Acute alcoholic pancreatitis   Alcohol abuse    Necrotizing pancreatitis   Leukocytosis   Abdominal pain, epigastric   Pancreatitis   Non-intractable vomiting with nausea   Hepatic steatosis   Constipation   Annular pancreas   Time spent: 40mins  Kathie Dike, MD [facility] Pager [phone]  If 7PM-7AM, please contact night-coverage [email or web address] Password [email or web address] 01/02/2018, 6:58 PM    LOS: 7 days
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
## 2018-01-03 ENCOUNTER — Telehealth: Payer: Self-pay | Admitting: Gastroenterology

## 2018-01-03 ENCOUNTER — Inpatient Hospital Stay (HOSPITAL_COMMUNITY): Payer: Self-pay

## 2018-01-03 LAB — CULTURE, BLOOD (ROUTINE X 2)
Culture: NO GROWTH
Culture: NO GROWTH
Special Requests: ADEQUATE
Special Requests: ADEQUATE

## 2018-01-03 MED ORDER — AMOXICILLIN-POT CLAVULANATE 500-125 MG PO TABS
1.0000 | ORAL_TABLET | Freq: Two times a day (BID) | ORAL | 0 refills | Status: DC
Start: 2018-01-03 — End: 2018-03-02

## 2018-01-03 MED ORDER — IOPAMIDOL (ISOVUE-300) INJECTION 61%
100.0000 mL | Freq: Once | INTRAVENOUS | Status: AC | PRN
  Administered 2018-01-03: 100 mL via INTRAVENOUS

## 2018-01-03 MED ORDER — AMITRIPTYLINE HCL 25 MG PO TABS: 25.0000 mg | ORAL_TABLET | Freq: Every day | ORAL | 0 refills | Status: DC

## 2018-01-03 MED ORDER — SENNA 8.6 MG PO TABS: 1.0000 | ORAL_TABLET | Freq: Two times a day (BID) | ORAL | 0 refills | Status: DC

## 2018-01-03 MED ORDER — OXYCODONE HCL 10 MG PO TABS: 10.0000 mg | ORAL_TABLET | Freq: Four times a day (QID) | ORAL | 0 refills | Status: DC | PRN

## 2018-01-03 MED ORDER — METOPROLOL TARTRATE 25 MG PO TABS: 25.0000 mg | ORAL_TABLET | Freq: Two times a day (BID) | ORAL | 0 refills | Status: DC

## 2018-01-03 MED ORDER — SODIUM CHLORIDE 0.9 % IV SOLN
1.0000 g | Freq: Once | INTRAVENOUS | Status: AC
  Administered 2018-01-03: 1000 mg via INTRAVENOUS
  Filled 2018-01-03: qty 1
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
## 2018-01-03 NOTE — Progress Notes (Signed)
Discharge instructions given to patient, verbalized understanding. IV's removed, patient tolerated procedure well. Printed prescriptions given to patient.

## 2018-01-03 NOTE — Telephone Encounter (Signed)
OPV SEP 11 @1130  WITH DR. Rudolph Daoust,Dx: PANCREATITIS. NEEDS MRI PANCREAS AFTER NEXT OPV.

## 2018-01-03 NOTE — Care Management Note (Signed)
Case Management Note  Patient Details  Name: Lynford Espinoza MRN: [id number] Date of Birth: 05-Mar-1986  Subjective/Objective:                    Action/Plan: Discharging home today.  [facility] voucher given,    Expected Discharge Date:  01/03/18               Expected Discharge Plan:  Home/Self Care  In-House Referral:     Discharge planning Services  CM Consult, [facility]  Post Acute Care Choice:  NA Choice offered to:  NA  DME Arranged:    DME Agency:     HH Arranged:    HH Agency:     Status of Service:  Completed, signed off  If discussed at [place] of Stay Meetings, dates discussed:   01/03/2018 Additional Comments:  Sesilia Poucher, Chauncey Reading, RN 01/03/2018, 3:01 PM

## 2018-01-10 NOTE — Telephone Encounter (Signed)
PATIENT SCHEDULED  °

## 2018-02-12 NOTE — Telephone Encounter (Signed)
Patient changed to urgent appointment with lsl

## 2018-02-12 NOTE — Telephone Encounter (Signed)
CHANGE APPT TO URGENT SLOT W/ LL @1130 .

## 2018-02-13 ENCOUNTER — Telehealth: Payer: Self-pay | Admitting: Gastroenterology

## 2018-02-13 ENCOUNTER — Encounter: Payer: Self-pay | Admitting: Gastroenterology

## 2018-02-13 ENCOUNTER — Ambulatory Visit: Payer: Self-pay | Admitting: Gastroenterology

## 2018-02-13 NOTE — Telephone Encounter (Signed)
PATIENT WAS A NO SHOW AND LETTER SENT  °

## 2018-02-13 NOTE — Telephone Encounter (Signed)
REVIEWED-NO ADDITIONAL RECOMMENDATIONS. 

## 2018-02-14 ENCOUNTER — Inpatient Hospital Stay: Payer: Self-pay | Admitting: Gastroenterology

## 2018-02-27 ENCOUNTER — Emergency Department (HOSPITAL_COMMUNITY): Payer: Self-pay

## 2018-02-27 ENCOUNTER — Encounter (HOSPITAL_COMMUNITY): Payer: Self-pay

## 2018-02-27 ENCOUNTER — Inpatient Hospital Stay (HOSPITAL_COMMUNITY)
Admission: EM | Admit: 2018-02-27 | Discharge: 2018-03-02 | DRG: 440 | Disposition: A | Discharge status: Home / Self Care | Payer: Self-pay | Attending: Internal Medicine | Admitting: Internal Medicine

## 2018-02-27 ENCOUNTER — Other Ambulatory Visit: Payer: Self-pay

## 2018-02-27 DIAGNOSIS — K5900 Constipation, unspecified: Secondary | ICD-10-CM | POA: Diagnosis present

## 2018-02-27 DIAGNOSIS — Z79899 Other long term (current) drug therapy: Secondary | ICD-10-CM

## 2018-02-27 DIAGNOSIS — R001 Bradycardia, unspecified: Secondary | ICD-10-CM | POA: Diagnosis present

## 2018-02-27 DIAGNOSIS — Z720 Tobacco use: Secondary | ICD-10-CM

## 2018-02-27 DIAGNOSIS — R112 Nausea with vomiting, unspecified: Secondary | ICD-10-CM | POA: Diagnosis present

## 2018-02-27 DIAGNOSIS — F1010 Alcohol abuse, uncomplicated: Secondary | ICD-10-CM | POA: Diagnosis present

## 2018-02-27 DIAGNOSIS — K860 Alcohol-induced chronic pancreatitis: Secondary | ICD-10-CM | POA: Diagnosis present

## 2018-02-27 DIAGNOSIS — Z79891 Long term (current) use of opiate analgesic: Secondary | ICD-10-CM

## 2018-02-27 DIAGNOSIS — F17210 Nicotine dependence, cigarettes, uncomplicated: Secondary | ICD-10-CM | POA: Diagnosis present

## 2018-02-27 DIAGNOSIS — K859 Acute pancreatitis without necrosis or infection, unspecified: Secondary | ICD-10-CM | POA: Diagnosis present

## 2018-02-27 DIAGNOSIS — Z9111 Patient's noncompliance with dietary regimen: Secondary | ICD-10-CM

## 2018-02-27 DIAGNOSIS — G8929 Other chronic pain: Secondary | ICD-10-CM | POA: Diagnosis present

## 2018-02-27 DIAGNOSIS — E876 Hypokalemia: Secondary | ICD-10-CM

## 2018-02-27 DIAGNOSIS — I10 Essential (primary) hypertension: Secondary | ICD-10-CM

## 2018-02-27 DIAGNOSIS — K760 Fatty (change of) liver, not elsewhere classified: Secondary | ICD-10-CM | POA: Diagnosis present

## 2018-02-27 DIAGNOSIS — K852 Alcohol induced acute pancreatitis without necrosis or infection: Principal | ICD-10-CM | POA: Diagnosis present

## 2018-02-27 HISTORY — DX: Acute pancreatitis without necrosis or infection, unspecified: K85.90

## 2018-02-27 LAB — CBC WITH DIFFERENTIAL/PLATELET
Basophils Absolute: 0 10*3/uL (ref 0.0–0.1)
Basophils Relative: 0 %
Eosinophils Absolute: 0.2 10*3/uL (ref 0.0–0.7)
Eosinophils Relative: 1 %
HCT: 47 % (ref 39.0–52.0)
Hemoglobin: 16.5 g/dL (ref 13.0–17.0)
Lymphocytes Relative: 17 %
Lymphs Abs: 2.6 10*3/uL (ref 0.7–4.0)
MCH: 29.6 pg (ref 26.0–34.0)
MCHC: 35.1 g/dL (ref 30.0–36.0)
MCV: 84.2 fL (ref 78.0–100.0)
Monocytes Absolute: 1.2 10*3/uL — ABNORMAL HIGH (ref 0.1–1.0)
Monocytes Relative: 8 %
Neutro Abs: 11 10*3/uL — ABNORMAL HIGH (ref 1.7–7.7)
Neutrophils Relative %: 74 %
Platelets: 296 10*3/uL (ref 150–400)
RBC: 5.58 MIL/uL (ref 4.22–5.81)
RDW: 14.4 % (ref 11.5–15.5)
WBC: 14.9 10*3/uL — ABNORMAL HIGH (ref 4.0–10.5)

## 2018-02-27 LAB — COMPREHENSIVE METABOLIC PANEL
ALT: 42 U/L (ref 0–44)
AST: 39 U/L (ref 15–41)
Albumin: 4.6 g/dL (ref 3.5–5.0)
Alkaline Phosphatase: 88 U/L (ref 38–126)
Anion gap: 13 (ref 5–15)
BUN: 14 mg/dL (ref 6–20)
CO2: 29 mmol/L (ref 22–32)
Calcium: 9.3 mg/dL (ref 8.9–10.3)
Chloride: 95 mmol/L — ABNORMAL LOW (ref 98–111)
Creatinine, Ser: 0.99 mg/dL (ref 0.61–1.24)
GFR calc Af Amer: >60 mL/min (ref >60)
GFR calc non Af Amer: >60 mL/min (ref >60)
Glucose, Bld: 128 mg/dL — ABNORMAL HIGH (ref 70–99)
Potassium: 2.6 mmol/L — CL (ref 3.5–5.1)
Sodium: 137 mmol/L (ref 135–145)
Total Bilirubin: 1.2 mg/dL (ref 0.3–1.2)
Total Protein: 8.1 g/dL (ref 6.5–8.1)

## 2018-02-27 LAB — URINALYSIS, ROUTINE W REFLEX MICROSCOPIC
Bacteria, UA: NONE SEEN
Glucose, UA: NEGATIVE mg/dL
Hgb urine dipstick: NEGATIVE
Ketones, ur: NEGATIVE mg/dL
Leukocytes, UA: NEGATIVE
Nitrite: NEGATIVE
Protein, ur: 100 mg/dL — AB
Specific Gravity, Urine: 1.036 — ABNORMAL HIGH (ref 1.005–1.030)
pH: 7 (ref 5.0–8.0)

## 2018-02-27 LAB — ETHANOL: Alcohol, Ethyl (B): <10 mg/dL (ref <10)

## 2018-02-27 LAB — LIPASE, BLOOD: Lipase: 82 U/L — ABNORMAL HIGH (ref 11–51)

## 2018-02-27 MED ORDER — FAMOTIDINE IN NACL 20-0.9 MG/50ML-% IV SOLN
20.0000 mg | Freq: Two times a day (BID) | INTRAVENOUS | Status: AC
  Administered 2018-02-27 – 2018-02-28 (×2): 20 mg via INTRAVENOUS
  Filled 2018-02-27 (×2): qty 50

## 2018-02-27 MED ORDER — ONDANSETRON HCL 4 MG/2ML IJ SOLN: 4.0000 mg | Freq: Four times a day (QID) | INTRAMUSCULAR | Status: DC | PRN

## 2018-02-27 MED ORDER — PANTOPRAZOLE SODIUM 40 MG IV SOLR
40.0000 mg | Freq: Once | INTRAVENOUS | Status: AC
  Administered 2018-02-27: 40 mg via INTRAVENOUS
  Filled 2018-02-27: qty 40

## 2018-02-27 MED ORDER — DIPHENHYDRAMINE HCL 50 MG/ML IJ SOLN: 12.5000 mg | Freq: Four times a day (QID) | INTRAMUSCULAR | Status: DC | PRN

## 2018-02-27 MED ORDER — IOPAMIDOL (ISOVUE-300) INJECTION 61%
100.0000 mL | Freq: Once | INTRAVENOUS | Status: AC | PRN
  Administered 2018-02-27: 100 mL via INTRAVENOUS

## 2018-02-27 MED ORDER — ACETAMINOPHEN 650 MG RE SUPP: 650.0000 mg | Freq: Four times a day (QID) | RECTAL | Status: DC | PRN

## 2018-02-27 MED ORDER — HYDROMORPHONE HCL 1 MG/ML IJ SOLN
1.0000 mg | Freq: Once | INTRAMUSCULAR | Status: AC
  Administered 2018-02-27: 1 mg via INTRAVENOUS
  Filled 2018-02-27: qty 1

## 2018-02-27 MED ORDER — NICOTINE 7 MG/24HR TD PT24
7.0000 mg | MEDICATED_PATCH | Freq: Every day | TRANSDERMAL | Status: DC
  Administered 2018-02-28: 7 mg via TRANSDERMAL
  Filled 2018-02-27 (×2): qty 1

## 2018-02-27 MED ORDER — ONDANSETRON HCL 4 MG PO TABS: 4.0000 mg | ORAL_TABLET | Freq: Four times a day (QID) | ORAL | Status: DC | PRN

## 2018-02-27 MED ORDER — SODIUM CHLORIDE 0.9% FLUSH: 9.0000 mL | INTRAVENOUS | Status: DC | PRN

## 2018-02-27 MED ORDER — SODIUM CHLORIDE 0.9 % IV BOLUS
1000.0000 mL | Freq: Once | INTRAVENOUS | Status: AC
  Administered 2018-02-27: 1000 mL via INTRAVENOUS

## 2018-02-27 MED ORDER — POTASSIUM CHLORIDE CRYS ER 20 MEQ PO TBCR
40.0000 meq | EXTENDED_RELEASE_TABLET | Freq: Once | ORAL | Status: AC
  Administered 2018-02-27: 40 meq via ORAL
  Filled 2018-02-27: qty 2

## 2018-02-27 MED ORDER — ACETAMINOPHEN 325 MG PO TABS: 650.0000 mg | ORAL_TABLET | Freq: Four times a day (QID) | ORAL | Status: DC | PRN

## 2018-02-27 MED ORDER — DIPHENHYDRAMINE HCL 12.5 MG/5ML PO ELIX: 12.5000 mg | ORAL_SOLUTION | Freq: Four times a day (QID) | ORAL | Status: DC | PRN

## 2018-02-27 MED ORDER — POTASSIUM CHLORIDE 10 MEQ/100ML IV SOLN
10.0000 meq | Freq: Once | INTRAVENOUS | Status: AC
  Administered 2018-02-27: 10 meq via INTRAVENOUS
  Filled 2018-02-27: qty 100

## 2018-02-27 MED ORDER — HYDROMORPHONE 1 MG/ML IV SOLN
INTRAVENOUS | Status: DC
  Administered 2018-02-27: 1 mg via INTRAVENOUS
  Administered 2018-02-28: 3.6 mg via INTRAVENOUS
  Administered 2018-02-28: 1.2 mg via INTRAVENOUS
  Administered 2018-02-28: 3.6 mg via INTRAVENOUS
  Administered 2018-03-01: 03:00:00 via INTRAVENOUS
  Administered 2018-03-01: 6.9 mg via INTRAVENOUS
  Filled 2018-02-27 (×2): qty 25

## 2018-02-27 MED ORDER — LORAZEPAM 2 MG/ML IJ SOLN: 1.0000 mg | Freq: Four times a day (QID) | INTRAMUSCULAR | Status: DC | PRN

## 2018-02-27 MED ORDER — ONDANSETRON HCL 4 MG/2ML IJ SOLN
4.0000 mg | Freq: Once | INTRAMUSCULAR | Status: AC
  Administered 2018-02-27: 4 mg via INTRAVENOUS
  Filled 2018-02-27: qty 2

## 2018-02-27 MED ORDER — POTASSIUM CHLORIDE IN NACL 20-0.45 MEQ/L-% IV SOLN
INTRAVENOUS | Status: DC
  Administered 2018-02-27 – 2018-03-01 (×5): via INTRAVENOUS
  Filled 2018-02-27 (×15): qty 1000

## 2018-02-27 MED ORDER — SODIUM CHLORIDE 0.9 % IV BOLUS: 2000.0000 mL | Freq: Once | INTRAVENOUS | Status: DC

## 2018-02-27 MED ORDER — SODIUM CHLORIDE 0.9 % IV SOLN: INTRAVENOUS | Status: DC

## 2018-02-27 MED ORDER — NALOXONE HCL 0.4 MG/ML IJ SOLN: 0.4000 mg | INTRAMUSCULAR | Status: DC | PRN

## 2018-02-27 NOTE — ED Notes (Signed)
Pt returned from xray

## 2018-02-27 NOTE — ED Notes (Signed)
CRITICAL VALUE ALERT  Critical Value:  K 2.6  Date & Time Notied:  02-27-18 1332  Provider Notified: [place]  Orders Received/Actions taken: continue to monitor

## 2018-02-27 NOTE — H&P (Signed)
History and Physical    David Miranda ZOX:[id number] DOB: 25-Mar-1986 DOA: 02/27/2018  PCP: Patient, No Pcp Per   Patient coming from: Home  Chief Complaint: Epigastric abdominal pain; N/V  HPI: David Miranda is a 32 y.o. male with medical history significant for pancreatitis, alcohol abuse, tobacco abuse, and hypertension who presented to the ED with complaints of worsening, dull/achy epigastric abdominal pain that began yesterday afternoon.  This was associated with ongoing nausea with 15 episodes of emesis between the start of the pain in today's visit.  He denies any hematemesis, diarrhea, fevers, or chills.  He denies radiation of pain to the back, but feels that this pain is similar to his prior pancreatitis episode noted at the end of July.  He is actually had some chronic abdominal pain since then and has not been following a low-fat diet because he states he has not been told to do so.  Additionally, he did have 1/2 pint of whiskey for birthday celebration just 3 days ago. He denies any chest pain, shortness of breath, palpitations, or other symptoms.   ED Course: Vital signs are stable, but telemetry demonstrates some minor bradycardia with heart rate in the 40-50 be per minute range.  His blood pressures are elevated.  Potassium is 2.6, WBC count is 14.9.  Glucose is noted to be 128.  He has been given multiple doses of Dilaudid which only appeared to help for 15 minutes at a time.  He has received a 1 L fluid bolus and has been started on IV fluid infusion.  He is also received potassium supplementation.  Lipase was noted to be 82 without significant elevations in liver function tests.  CT of the abdomen and pelvis demonstrates acute on chronic pancreatitis.  Review of Systems: All others reviewed and otherwise negative.  Past Medical History:  Diagnosis Date  . Pancreatitis     History reviewed. No pertinent surgical history.   reports that he has been smoking cigarettes. He has  been smoking about 1.00 pack per day. He has never used smokeless tobacco. He reports that he drinks alcohol. He reports that he has current or past drug history. Drug: Marijuana.  No Known Allergies  Family History  Problem Relation Age of Onset  . Cancer Other     Prior to Admission medications   Medication Sig Start Date End Date Taking? Authorizing Provider  amitriptyline (ELAVIL) 25 MG tablet Take 1 tablet (25 mg total) by mouth at bedtime. Patient taking differently: Take 25 mg by mouth at bedtime as needed for sleep.  01/03/18  Yes Erick Blinks, MD  Ca Carbonate-Mag Hydroxide (ROLAIDS) 550-110 MG CHEW Chew 2 tablets by mouth daily as needed (heart burn / acid reflux.).    Yes [provider]  oxyCODONE 10 MG TABS Take 1 tablet (10 mg total) by mouth every 6 (six) hours as needed for severe pain. 01/03/18  Yes Erick Blinks, MD  amoxicillin-clavulanate (AUGMENTIN) 500-125 MG tablet Take 1 tablet (500 mg total) by mouth 2 (two) times daily. Patient not taking: Reported on 02/27/2018 01/03/18   Erick Blinks, MD  metoprolol tartrate (LOPRESSOR) 25 MG tablet Take 1 tablet (25 mg total) by mouth 2 (two) times daily. Patient not taking: Reported on 02/27/2018 01/03/18   Erick Blinks, MD  senna (SENOKOT) 8.6 MG TABS tablet Take 1 tablet (8.6 mg total) by mouth 2 (two) times daily with a meal. Patient not taking: Reported on 02/27/2018 01/03/18   Erick Blinks, MD  Physical Exam: Vitals:   02/27/18 1544 02/27/18 1637 02/27/18 1700 02/27/18 1730  BP: (!) 156/102 (!) 152/106 (!) 148/100 (!) 149/112  Pulse: 60  (!) 54 62  Resp: 18 19 (!) 25 15  Temp:      TempSrc:      SpO2: 98%  99% 100%  Weight:      Height:        Constitutional: NAD, calm, comfortable Vitals:   02/27/18 1544 02/27/18 1637 02/27/18 1700 02/27/18 1730  BP: (!) 156/102 (!) 152/106 (!) 148/100 (!) 149/112  Pulse: 60  (!) 54 62  Resp: 18 19 (!) 25 15  Temp:      TempSrc:      SpO2: 98%  99% 100%    Weight:      Height:       Eyes: lids and conjunctivae normal ENMT: Mucous membranes are moist.  Neck: normal, supple Respiratory: clear to auscultation bilaterally. Normal respiratory effort. No accessory muscle use.  Cardiovascular: Regular rate and rhythm, no murmurs. No extremity edema. Abdomen: Tenderness to palpation over the epigastric region, no distention. Bowel sounds positive.  Musculoskeletal:  No joint deformity upper and lower extremities.   Skin: no rashes, lesions, ulcers.  Tattoos throughout the skin. Psychiatric: Normal judgment and insight. Alert and oriented x 3. Normal mood.   Labs on Admission: I have personally reviewed following labs and imaging studies  CBC: Recent Labs  Lab 02/27/18 1253  WBC 14.9*  NEUTROABS 11.0*  HGB 16.5  HCT 47.0  MCV 84.2  PLT 296   Basic Metabolic Panel: Recent Labs  Lab 02/27/18 1253  NA 137  K 2.6*  CL 95*  CO2 29  GLUCOSE 128*  BUN 14  CREATININE 0.99  CALCIUM 9.3   GFR: Estimated Creatinine Clearance: 115.1 mL/min (by C-G formula based on SCr of 0.99 mg/dL). Liver Function Tests: Recent Labs  Lab 02/27/18 1253  AST 39  ALT 42  ALKPHOS 88  BILITOT 1.2  PROT 8.1  ALBUMIN 4.6   Recent Labs  Lab 02/27/18 1253  LIPASE 82*   No results for input(s): AMMONIA in the last 168 hours. Coagulation Profile: No results for input(s): INR, PROTIME in the last 168 hours. Cardiac Enzymes: No results for input(s): CKTOTAL, CKMB, CKMBINDEX, TROPONINI in the last 168 hours. BNP (last 3 results) No results for input(s): PROBNP in the last 8760 hours. HbA1C: No results for input(s): HGBA1C in the last 72 hours. CBG: No results for input(s): GLUCAP in the last 168 hours. Lipid Profile: No results for input(s): CHOL, HDL, LDLCALC, TRIG, CHOLHDL, LDLDIRECT in the last 72 hours. Thyroid Function Tests: No results for input(s): TSH, T4TOTAL, FREET4, T3FREE, THYROIDAB in the last 72 hours. Anemia Panel: No results for  input(s): VITAMINB12, FOLATE, FERRITIN, TIBC, IRON, RETICCTPCT in the last 72 hours. Urine analysis:    Component Value Date/Time   COLORURINE AMBER (A) 02/27/2018 1240   APPEARANCEUR HAZY (A) 02/27/2018 1240   LABSPEC 1.036 (H) 02/27/2018 1240   PHURINE 7.0 02/27/2018 1240   GLUCOSEU NEGATIVE 02/27/2018 1240   HGBUR NEGATIVE 02/27/2018 1240   BILIRUBINUR MODERATE (A) 02/27/2018 1240   KETONESUR NEGATIVE 02/27/2018 1240   PROTEINUR 100 (A) 02/27/2018 1240   NITRITE NEGATIVE 02/27/2018 1240   LEUKOCYTESUR NEGATIVE 02/27/2018 1240    Radiological Exams on Admission: Ct Abdomen Pelvis W Contrast  Result Date: 02/27/2018 CLINICAL DATA:  Upper abdominal pain for 2 days, history of pancreatitis EXAM: CT ABDOMEN AND PELVIS WITH CONTRAST TECHNIQUE:  Multidetector CT imaging of the abdomen and pelvis was performed using the standard protocol following bolus administration of intravenous contrast. CONTRAST:  ISOVUE-300 IOPAMIDOL (ISOVUE-300) INJECTION 61% COMPARISON:  01/03/2018 FINDINGS: Lower chest: No acute abnormality. Hepatobiliary: The gallbladder is again well visualized and within normal limits. The liver again demonstrates geographic areas of decreased attenuation consistent with fatty infiltration. Pancreas: Persistent changes surrounding the pancreas consistent with pancreatitis. No definitive pancreatic necrosis is seen. The collection noted along the midbody is less prominent now measuring 2.9 x 1.5 cm. It previously measured 3.7 x 2.7 cm. The collection surrounding the celiac axis is not well appreciated consistent with resolution. Local inflammatory changes are noted surrounding the head with some associated involvement of the adjacent duodenum. The fluid collection adjacent to the tail has also decreased in size significantly. Spleen: Normal in size without focal abnormality. Adrenals/Urinary Tract: Adrenal glands are within normal limits. The kidneys are well visualized bilaterally.  No renal calculi or obstructive changes are seen. Left renal cyst is noted and stable. The bladder is decompressed. Stomach/Bowel: No obstructive or inflammatory changes of the larger small-bowel are seen with the exception of the changes surrounding the first and second portion of the duodenum. The appendix is within normal limits. The stomach is unremarkable. Vascular/Lymphatic: No significant vascular findings are present. No enlarged abdominal or pelvic lymph nodes. Reproductive: Prostate is unremarkable. Other: No abdominal wall hernia or abnormality. No abdominopelvic ascites. Musculoskeletal: No acute or significant osseous findings. IMPRESSION: Changes of acute pancreatitis superimposed on more chronic appearing changes (fluid collections) which are resolving when compared with the prior exam. The acute changes involve primarily the head and uncinate process. Some localized inflammatory change of the adjacent duodenum is noted as well. Stable geographic hepatic steatosis. Electronically Signed   By: Alcide Clever M.D.   On: 02/27/2018 16:06    EKG: Independently reviewed. 55bpm SR.  Assessment/Plan Principal Problem:   Acute alcoholic pancreatitis Active Problems:   Alcohol abuse   Non-intractable vomiting with nausea   Hypokalemia   Essential hypertension   Tobacco abuse    1. Acute on chronic alcoholic pancreatitis.  He continues to maintain a low baseline level of chronic pain which may be attributed to his intake of a fatty diet.  He did have a significant amount of alcohol approximately 3 days ago which may have incited this current event.  We will plan to bolus another 2 L of IV fluid and maintain on a high rate of infusion and monitor serum lipase levels.  Maintain on n.p.o. status with Zofran as needed for nausea or vomiting.  Dilaudid PCA for pain management.  GI consultation for assistance in management as patient has anatomic abnormalities with noted annular pancreas in the  past-plans for MRI soon. 2. Hypokalemia.  This is likely related to ongoing nausea and vomiting.  Patient has received a total of 60 mEq of replacement in the ED.  We will recheck labs on CMP along with magnesium in a.m.  Continue monitoring on telemetry due to bradycardia as well as hypokalemia. 3. Essential hypertension.  Patient was previously discharged on metoprolol 25 twice daily, but this appears an appropriate with his current bradycardia.  We will continue to observe and reassess after pain has been better controlled.  Hydralazine pushes as needed for severe elevations at this time. 4. Tobacco abuse.  Counseled on cessation.  Nicotine patch. 5. History of alcohol abuse.  Maintain on CIWA protocol with Ativan for agitation as needed.   DVT  prophylaxis: SCDs Code Status: Full Family Communication: Girlfriend at bedside Disposition Plan:Pancreatitis treatment; GI evaluation Consults called:GI Admission status: Inpatient, Tele   Steele Stracener Hoover Brunette DO [facility] Pager [phone]  If 7PM-7AM, please contact night-coverage [email or web address] Password [facility]  02/27/2018, 5:54 PM

## 2018-02-27 NOTE — ED Provider Notes (Signed)
[facility] EMERGENCY DEPARTMENT Provider Note   CSN: [id number] Arrival date & time: 02/27/18  1149     History   Chief Complaint Chief Complaint  Patient presents with  . Abdominal Pain    HPI David Miranda is a 32 y.o. male.  Patient was admitted the end of July for acute pancreatitis.  His lipase got into the thousand range at that time.  Patient states that he always had some discomfort since then but starting yesterday started to get severe pain again reminding him of the pancreatitis.  Started in the epigastric area but now hurts all over.  Associated with multiple episodes of nausea and vomiting probably about 15.  The cause of the pancreatitis in July was felt to be alcohol related.  Patient states he last used alcohol 3 days ago.  Patient denies vomiting of blood.  Patient does not have a primary care provider.  Patient states the pain is 8 out of 10.     Past Medical History:  Diagnosis Date  . Pancreatitis     Patient Active Problem List   Diagnosis Date Noted  . Constipation   . Annular pancreas   . Pancreatitis   . Non-intractable vomiting with nausea   . Hepatic steatosis   . 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 Leukocytosis 12/27/2017  . Abdominal pain, epigastric 12/27/2017  . Acute alcoholic pancreatitis 76/73/4193  . Alcohol abuse 12/26/2017    History reviewed. No pertinent surgical history.      Home Medications    Prior to Admission medications   Medication Sig Start Date End Date Taking? Authorizing Provider  amitriptyline (ELAVIL) 25 MG tablet Take 1 tablet (25 mg total) by mouth at bedtime. Patient taking differently: Take 25 mg by mouth at bedtime as needed for sleep.  01/03/18  Yes Kathie Dike, MD  Ca Carbonate-Mag Hydroxide (ROLAIDS) 550-110 MG CHEW Chew 2 tablets by mouth daily as needed (heart burn / acid reflux.).    Yes [provider]  oxyCODONE 10 MG TABS Take 1 tablet (10 mg total) by mouth every 6 (six) hours as  needed for severe pain. 01/03/18  Yes Kathie Dike, MD  amoxicillin-clavulanate (AUGMENTIN) 500-125 MG tablet Take 1 tablet (500 mg total) by mouth 2 (two) times daily. Patient not taking: Reported on 02/27/2018 01/03/18   Kathie Dike, MD  metoprolol tartrate (LOPRESSOR) 25 MG tablet Take 1 tablet (25 mg total) by mouth 2 (two) times daily. Patient not taking: Reported on 02/27/2018 01/03/18   Kathie Dike, MD  senna (SENOKOT) 8.6 MG TABS tablet Take 1 tablet (8.6 mg total) by mouth 2 (two) times daily with a meal. Patient not taking: Reported on 02/27/2018 01/03/18   Kathie Dike, MD    Family History Family History  Problem Relation Age of Onset  . Cancer Other     Social History Social History   Tobacco Use  . Smoking status: Current Every Day Smoker    Packs/day: 1.00    Types: Cigarettes  . Smokeless tobacco: Never Used  Substance Use Topics  . Alcohol use: Yes    Comment: 3 days ago  . Drug use: Yes    Types: Marijuana    Comment: occasionally     Allergies   Patient has no known allergies.   Review of Systems Review of Systems  Constitutional: Negative for fever.  HENT: Negative for congestion.   Eyes: Negative for redness.  Respiratory: Negative for shortness of breath.   Cardiovascular: Negative for chest pain.  Gastrointestinal: Positive for abdominal pain, nausea and vomiting.  Genitourinary: Negative for dysuria.  Musculoskeletal: Negative for back pain.  Skin: Negative for rash.  Neurological: Negative for syncope and headaches.  Hematological: Does not bruise/bleed easily.  Psychiatric/Behavioral: Negative for confusion.     Physical Exam Updated Vital Signs BP (!) 156/102   Pulse 60   Temp 97.7 F (36.5 C) (Oral)   Resp 18   Ht 1.803 m (5\' 11" )   Wt 83.5 kg   SpO2 98%   BMI 25.67 kg/m   Physical Exam  Constitutional: He is oriented to person, place, and time. He appears well-developed and well-nourished. No distress.  HENT:  Head:  Normocephalic and atraumatic.  Mouth/Throat: Oropharynx is clear and moist.  Eyes: Pupils are equal, round, and reactive to light. EOM are normal. No scleral icterus.  Neck: Normal range of motion. Neck supple.  Cardiovascular: Normal rate and regular rhythm.  Pulmonary/Chest: Effort normal and breath sounds normal. No respiratory distress.  Abdominal: Soft. Bowel sounds are normal. There is tenderness.  Musculoskeletal: Normal range of motion. He exhibits no edema.  Neurological: He is alert and oriented to person, place, and time. No cranial nerve deficit or sensory deficit. He exhibits normal muscle tone. Coordination normal.  Skin: Skin is warm. No rash noted.  Nursing note and vitals reviewed.    ED Treatments / Results  Labs (all labs ordered are listed, but only abnormal results are displayed) Labs Reviewed  CBC WITH DIFFERENTIAL/PLATELET - Abnormal; Notable for the following components:      Result Value   WBC 14.9 (*)    Neutro Abs 11.0 (*)    Monocytes Absolute 1.2 (*)    All other components within normal limits  COMPREHENSIVE METABOLIC PANEL - Abnormal; Notable for the following components:   Potassium 2.6 (*)    Chloride 95 (*)    Glucose, Bld 128 (*)    All other components within normal limits  LIPASE, BLOOD - Abnormal; Notable for the following components:   Lipase 82 (*)    All other components within normal limits  URINALYSIS, ROUTINE W REFLEX MICROSCOPIC - Abnormal; Notable for the following components:   Color, Urine AMBER (*)    APPearance HAZY (*)    Specific Gravity, Urine 1.036 (*)    Bilirubin Urine MODERATE (*)    Protein, ur 100 (*)    All other components within normal limits  ETHANOL    EKG EKG Interpretation  Date/Time:  Wednesday February 27 2018 14:17:27 EDT Ventricular Rate:  55 PR Interval:    QRS Duration: 87 QT Interval:  577 QTC Calculation: 552 R Axis:   62 Text Interpretation:  Sinus rhythm Prolonged QT interval Baseline  wander in lead(s) V2 Confirmed by Fredia Sorrow [phone]) on 02/27/2018 2:21:14 PM   Radiology Ct Abdomen Pelvis W Contrast  Result Date: 02/27/2018 CLINICAL DATA:  Upper abdominal pain for 2 days, history of pancreatitis EXAM: CT ABDOMEN AND PELVIS WITH CONTRAST TECHNIQUE: Multidetector CT imaging of the abdomen and pelvis was performed using the standard protocol following bolus administration of intravenous contrast. CONTRAST:  177mL ISOVUE-300 IOPAMIDOL (ISOVUE-300) INJECTION 61% COMPARISON:  01/03/2018 FINDINGS: Lower chest: No acute abnormality. Hepatobiliary: The gallbladder is again well visualized and within normal limits. The liver again demonstrates geographic areas of decreased attenuation consistent with fatty infiltration. Pancreas: Persistent changes surrounding the pancreas consistent with pancreatitis. No definitive pancreatic necrosis is seen. The collection noted along the midbody is less prominent now measuring 2.9 x  1.5 cm. It previously measured 3.7 x 2.7 cm. The collection surrounding the celiac axis is not well appreciated consistent with resolution. Local inflammatory changes are noted surrounding the head with some associated involvement of the adjacent duodenum. The fluid collection adjacent to the tail has also decreased in size significantly. Spleen: Normal in size without focal abnormality. Adrenals/Urinary Tract: Adrenal glands are within normal limits. The kidneys are well visualized bilaterally. No renal calculi or obstructive changes are seen. Left renal cyst is noted and stable. The bladder is decompressed. Stomach/Bowel: No obstructive or inflammatory changes of the larger small-bowel are seen with the exception of the changes surrounding the first and second portion of the duodenum. The appendix is within normal limits. The stomach is unremarkable. Vascular/Lymphatic: No significant vascular findings are present. No enlarged abdominal or pelvic lymph nodes. Reproductive:  Prostate is unremarkable. Other: No abdominal wall hernia or abnormality. No abdominopelvic ascites. Musculoskeletal: No acute or significant osseous findings. IMPRESSION: Changes of acute pancreatitis superimposed on more chronic appearing changes (fluid collections) which are resolving when compared with the prior exam. The acute changes involve primarily the head and uncinate process. Some localized inflammatory change of the adjacent duodenum is noted as well. Stable geographic hepatic steatosis. Electronically Signed   By: Inez Catalina M.D.   On: 02/27/2018 16:06    Procedures Procedures (including critical care time) CRITICAL CARE Performed by: Fredia Sorrow Total critical care time: 30 minutes Critical care time was exclusive of separately billable procedures and treating other patients. Critical care was necessary to treat or prevent imminent or life-threatening deterioration. Critical care was time spent personally by me on the following activities: development of treatment plan with patient and/or surrogate as well as nursing, discussions with consultants, evaluation of patient's response to treatment, examination of patient, obtaining history from patient or surrogate, ordering and performing treatments and interventions, ordering and review of laboratory studies, ordering and review of radiographic studies, pulse oximetry and re-evaluation of patient's condition.   Medications Ordered in ED Medications  0.9 %  sodium chloride infusion (has no administration in time range)  potassium chloride 10 mEq in 100 mL IVPB (10 mEq Intravenous New Bag/Given 02/27/18 1606)  HYDROmorphone (DILAUDID) injection 1 mg (has no administration in time range)  sodium chloride 0.9 % bolus 1,000 mL (0 mLs Intravenous Stopped 02/27/18 1545)  potassium chloride SA (K-DUR,KLOR-CON) CR tablet 40 mEq (40 mEq Oral Given 02/27/18 1423)  potassium chloride 10 mEq in 100 mL IVPB (0 mEq Intravenous Stopped 02/27/18  1545)  HYDROmorphone (DILAUDID) injection 1 mg (1 mg Intravenous Given 02/27/18 1436)  ondansetron (ZOFRAN) injection 4 mg (4 mg Intravenous Given 02/27/18 1435)  pantoprazole (PROTONIX) injection 40 mg (40 mg Intravenous Given 02/27/18 1436)  iopamidol (ISOVUE-300) 61 % injection 100 mL (100 mLs Intravenous Contrast Given 02/27/18 1549)     Initial Impression / Assessment and Plan / ED Course  I have reviewed the triage vital signs and the nursing notes.  Pertinent labs & imaging results that were available during my care of the patient were reviewed by me and considered in my medical decision making (see chart for details).    Patient with symptoms suggestive of recurrent pancreatitis.  Patient with an admission back in July for fairly severe acute pancreatitis with lipase went up to the thousand range.  Patient states that he never completely got better.  But symptoms got worse yesterday morning.  Associated with nausea and vomiting not vomiting any blood.  Pain is now all  over the abdomen was initially in the epigastric area.  Lipase mildly elevated here today at 82.  Alcohol level less than 10.  So no alcohol in the system.  Electrolytes show hypokalemia with a potassium of 2.6.  This is a critical level.  Renal function is normal.  Liver function test are normal.  CT scan of the abdomen shows acute on chronic pancreatitis.  Has a new acute area in the head of the pancreas.  This probably explains his pain.  Patient will require admission is requiring fair amount of pain control.  Also has a hypokalemia and this needs to be corrected as well.  For the hypokalemia patient received 40 milk limits of potassium p.o. as well as 10 mEq of potassium IV x2.  Will contact the hospitalist for admission.   Final Clinical Impressions(s) / ED Diagnoses   Final diagnoses:  Hypokalemia  Alcohol-induced acute pancreatitis without infection or necrosis    ED Discharge Orders    None         Fredia Sorrow, MD 02/27/18 [id number]

## 2018-02-27 NOTE — ED Notes (Signed)
Pt taken to ct. Nad.  

## 2018-02-27 NOTE — ED Triage Notes (Signed)
Pt c/o pain in upper abd and vomiting since last night.  Reports history of pancreatitis.  Denies diarrhea.  Reports was diagnosed with pancreatitis one month ago and has had pain since d/c'd but it got worse last night.

## 2018-02-28 DIAGNOSIS — K852 Alcohol induced acute pancreatitis without necrosis or infection: Principal | ICD-10-CM

## 2018-02-28 LAB — COMPREHENSIVE METABOLIC PANEL
ALT: 28 U/L (ref 0–44)
AST: 23 U/L (ref 15–41)
Albumin: 3.4 g/dL — ABNORMAL LOW (ref 3.5–5.0)
Alkaline Phosphatase: 66 U/L (ref 38–126)
Anion gap: 10 (ref 5–15)
BUN: 11 mg/dL (ref 6–20)
CO2: 27 mmol/L (ref 22–32)
Calcium: 8.2 mg/dL — ABNORMAL LOW (ref 8.9–10.3)
Chloride: 101 mmol/L (ref 98–111)
Creatinine, Ser: 0.77 mg/dL (ref 0.61–1.24)
GFR calc Af Amer: >60 mL/min (ref >60)
GFR calc non Af Amer: >60 mL/min (ref >60)
Glucose, Bld: 102 mg/dL — ABNORMAL HIGH (ref 70–99)
Potassium: 3.1 mmol/L — ABNORMAL LOW (ref 3.5–5.1)
Sodium: 138 mmol/L (ref 135–145)
Total Bilirubin: 0.7 mg/dL (ref 0.3–1.2)
Total Protein: 6.4 g/dL — ABNORMAL LOW (ref 6.5–8.1)

## 2018-02-28 LAB — CBC
HCT: 39.5 % (ref 39.0–52.0)
Hemoglobin: 13.1 g/dL (ref 13.0–17.0)
MCH: 28.5 pg (ref 26.0–34.0)
MCHC: 33.2 g/dL (ref 30.0–36.0)
MCV: 85.9 fL (ref 78.0–100.0)
Platelets: 233 10*3/uL (ref 150–400)
RBC: 4.6 MIL/uL (ref 4.22–5.81)
RDW: 14.7 % (ref 11.5–15.5)
WBC: 10.6 10*3/uL — ABNORMAL HIGH (ref 4.0–10.5)

## 2018-02-28 LAB — LIPASE, BLOOD: Lipase: 101 U/L — ABNORMAL HIGH (ref 11–51)

## 2018-02-28 LAB — MAGNESIUM: Magnesium: 1.6 mg/dL — ABNORMAL LOW (ref 1.7–2.4)

## 2018-02-28 MED ORDER — MAGNESIUM SULFATE 2 GM/50ML IV SOLN
2.0000 g | Freq: Once | INTRAVENOUS | Status: AC
  Administered 2018-02-28: 2 g via INTRAVENOUS
  Filled 2018-02-28: qty 50

## 2018-02-28 MED ORDER — TRAZODONE HCL 50 MG PO TABS
50.0000 mg | ORAL_TABLET | Freq: Once | ORAL | Status: AC
  Administered 2018-02-28: 50 mg via ORAL
  Filled 2018-02-28: qty 1

## 2018-02-28 NOTE — Progress Notes (Signed)
PROGRESS NOTE    David Miranda  NWG:[id number] DOB: [birth year] DOA: 02/27/2018 PCP: Patient, No Pcp Per   Brief Narrative:   David Miranda is a 32 y.o. male with medical history significant for pancreatitis, alcohol abuse, tobacco abuse, and hypertension who presented to the ED with complaints of worsening, dull/achy epigastric abdominal pain that began yesterday afternoon.  He had some significant nausea and vomiting with 15 episodes of emesis prior to admission.  He was admitted with acute, recurrent pancreatitis secondary to poor dietary compliance as well as alcohol use.  He has been started on IV fluid as well as Dilaudid PCA with improvements in his symptoms noted thus far.  He has been seen by GI with recommendations to continue the same with outpatient MRI in the near future.  Assessment & Plan:   Principal Problem:   Acute alcoholic pancreatitis Active Problems:   Alcohol abuse   Non-intractable vomiting with nausea   Hypokalemia   Essential hypertension   Tobacco abuse   Acute pancreatitis   1. Acute on chronic alcoholic pancreatitis secondary to alcohol use and dietary noncompliance-improving.    Appreciate GI involvement.  Continue aggressive IV fluid hydration with bolus as ordered.  Maintain on Dilaudid PCA for pain management with Zofran as needed for nausea and vomiting.  Continue n.p.o. except for sips of fluid for now until patient weans off narcotics.  Repeat CMP and lipase in a.m. 2. Hypokalemia-improving.  This is likely related to ongoing nausea and vomiting.  Patient has received a total of 60 mEq of replacement in the ED.  Continue on IV supplementation and follow labs.  No further need for telemetry today. 3. Essential hypertension-controlled.  Patient was previously discharged on metoprolol 25 twice daily, but this appears an appropriate with his current bradycardia.  We will continue to observe and reassess after pain has been better controlled.  Hydralazine pushes  as needed for severe elevations at this time. 4. Tobacco abuse.  Counseled on cessation.  Nicotine patch. 5. History of alcohol abuse.  Maintain on CIWA protocol with Ativan for agitation as needed.   DVT prophylaxis: SCDs Code Status: Full Family Communication: Discussed with girlfriend at bedside on 9/25 Disposition Plan: Acute pancreatitis treatment, potassium supplementation; likely wean off Dilaudid PCA by a.m. if improved and start clear liquids by a.m.   Consultants:   GI  Procedures:   None  Antimicrobials:   None   Subjective: Patient seen and evaluated today with no new acute complaints or concerns. No acute concerns or events noted overnight.  He continues to have some ongoing epigastric pain, but this is much improved compared to admission.  He denies any further nausea or vomiting at this time.  Objective: Vitals:   02/28/18 0246 02/28/18 0331 02/28/18 0700 02/28/18 1005  BP: 112/80  115/79   Pulse: (!) 49  (!) 54   Resp: 15 (!) 22 16 18   Temp: 98.1 F (36.7 C)  98.1 F (36.7 C)   TempSrc: Oral  Oral   SpO2: 99% 99% 99% 95%  Weight:      Height:        Intake/Output Summary (Last 24 hours) at 02/28/2018 1044 Last data filed at 02/28/2018 0900 Gross per 24 hour  Intake 2742.36 ml  Output -  Net 2742.36 ml   Filed Weights   02/27/18 1203 02/27/18 1856  Weight: 83.5 kg 83 kg    Examination:  General exam: Appears calm and comfortable  Respiratory system: Clear to auscultation. Respiratory  effort normal. Cardiovascular system: S1 & S2 heard, RRR. No JVD, murmurs, rubs, gallops or clicks. No pedal edema. Gastrointestinal system: Abdomen is nondistended, soft and tender to palpation. No organomegaly or masses felt. Normal bowel sounds heard. Central nervous system: Alert and oriented. No focal neurological deficits. Extremities: Symmetric 5 x 5 power. Skin: No rashes, lesions or ulcers, tattoos throughout Psychiatry: Judgement and insight appear  normal. Mood & affect appropriate.     Data Reviewed: I have personally reviewed following labs and imaging studies  CBC: Recent Labs  Lab 02/27/18 1253 02/28/18 0511  WBC 14.9* 10.6*  NEUTROABS 11.0*  --   HGB 16.5 13.1  HCT 47.0 39.5  MCV 84.2 85.9  PLT 296 233   Basic Metabolic Panel: Recent Labs  Lab 02/27/18 1253 02/28/18 0511  NA 137 138  K 2.6* 3.1*  CL 95* 101  CO2 29 27  GLUCOSE 128* 102*  BUN 14 11  CREATININE 0.99 0.77  CALCIUM 9.3 8.2*  MG  --  1.6*   GFR: Estimated Creatinine Clearance: 142.5 mL/min (by C-G formula based on SCr of 0.77 mg/dL). Liver Function Tests: Recent Labs  Lab 02/27/18 1253 02/28/18 0511  AST 39 23  ALT 42 28  ALKPHOS 88 66  BILITOT 1.2 0.7  PROT 8.1 6.4*  ALBUMIN 4.6 3.4*   Recent Labs  Lab 02/27/18 1253 02/28/18 0511  LIPASE 82* 101*   No results for input(s): AMMONIA in the last 168 hours. Coagulation Profile: No results for input(s): INR, PROTIME in the last 168 hours. Cardiac Enzymes: No results for input(s): CKTOTAL, CKMB, CKMBINDEX, TROPONINI in the last 168 hours. BNP (last 3 results) No results for input(s): PROBNP in the last 8760 hours. HbA1C: No results for input(s): HGBA1C in the last 72 hours. CBG: No results for input(s): GLUCAP in the last 168 hours. Lipid Profile: No results for input(s): CHOL, HDL, LDLCALC, TRIG, CHOLHDL, LDLDIRECT in the last 72 hours. Thyroid Function Tests: No results for input(s): TSH, T4TOTAL, FREET4, T3FREE, THYROIDAB in the last 72 hours. Anemia Panel: No results for input(s): VITAMINB12, FOLATE, FERRITIN, TIBC, IRON, RETICCTPCT in the last 72 hours. Sepsis Labs: No results for input(s): PROCALCITON, LATICACIDVEN in the last 168 hours.  No results found for this or any previous visit (from the past 240 hour(s)).       Radiology Studies: Ct Abdomen Pelvis W Contrast  Result Date: 02/27/2018 CLINICAL DATA:  Upper abdominal pain for 2 days, history of  pancreatitis EXAM: CT ABDOMEN AND PELVIS WITH CONTRAST TECHNIQUE: Multidetector CT imaging of the abdomen and pelvis was performed using the standard protocol following bolus administration of intravenous contrast. CONTRAST:  ISOVUE-300 IOPAMIDOL (ISOVUE-300) INJECTION 61% COMPARISON:  01/03/2018 FINDINGS: Lower chest: No acute abnormality. Hepatobiliary: The gallbladder is again well visualized and within normal limits. The liver again demonstrates geographic areas of decreased attenuation consistent with fatty infiltration. Pancreas: Persistent changes surrounding the pancreas consistent with pancreatitis. No definitive pancreatic necrosis is seen. The collection noted along the midbody is less prominent now measuring 2.9 x 1.5 cm. It previously measured 3.7 x 2.7 cm. The collection surrounding the celiac axis is not well appreciated consistent with resolution. Local inflammatory changes are noted surrounding the head with some associated involvement of the adjacent duodenum. The fluid collection adjacent to the tail has also decreased in size significantly. Spleen: Normal in size without focal abnormality. Adrenals/Urinary Tract: Adrenal glands are within normal limits. The kidneys are well visualized bilaterally. No renal calculi or obstructive  changes are seen. Left renal cyst is noted and stable. The bladder is decompressed. Stomach/Bowel: No obstructive or inflammatory changes of the larger small-bowel are seen with the exception of the changes surrounding the first and second portion of the duodenum. The appendix is within normal limits. The stomach is unremarkable. Vascular/Lymphatic: No significant vascular findings are present. No enlarged abdominal or pelvic lymph nodes. Reproductive: Prostate is unremarkable. Other: No abdominal wall hernia or abnormality. No abdominopelvic ascites. Musculoskeletal: No acute or significant osseous findings. IMPRESSION: Changes of acute pancreatitis superimposed on  more chronic appearing changes (fluid collections) which are resolving when compared with the prior exam. The acute changes involve primarily the head and uncinate process. Some localized inflammatory change of the adjacent duodenum is noted as well. Stable geographic hepatic steatosis. Electronically Signed   By: Alcide Clever M.D.   On: 02/27/2018 16:06        Scheduled Meds: . HYDROmorphone   Intravenous Q4H  . nicotine  7 mg Transdermal Daily   Continuous Infusions: . 0.45 % NaCl with KCl 20 mEq / L 100 mL/hr at 02/28/18 1043  . famotidine (PEPCID) IV 20 mg (02/27/18 2000)  . magnesium sulfate 1 - 4 g bolus IVPB 2 g (02/28/18 1015)  . sodium chloride       LOS: 1 day    Time spent: 30 minutes    Khristian Seals Hoover Brunette, DO [facility] Pager [phone]  If 7PM-7AM, please contact night-coverage [email or web address] Password [facility] 02/28/2018, 10:44 AM

## 2018-02-28 NOTE — Consult Note (Signed)
Referring Provider: Rodena Goldmann, DO Primary Care Physician:  Patient, No Pcp Per Primary Gastroenterologist:  Barney Drain, MD  Reason for Consultation:  Acute pancreatitis  HPI: David Miranda is a 32 y.o. male presenting with worsening abdominal pain.  He was admitted back in July with acute pancreatitis felt to be related to alcohol abuse. Initial CT with contrast suggestive of possible annular pancreas and possible necrosis. Repeat noncontrast CT 7/28 with increased peripancreatic inflammatory change but no well-defined pseudocyst.  Third CT that admission with contrast with no significant change in moderate acute pancreatitis, small peripancreatic fluid collection suspicious for developing pseudocyst, largest up to 4 cm.  There was decreased parenchymal enhancement seen in the pancreatic body suggesting mild pancreatic necrosis.  No abscess.  He was sent home on antibiotic therapy.  Plans for MRI pancreas in September however patient tells me he did not keep his appointment to see [place] because he did not have any insurance.  He has since applied for Medicaid, case pending.  Patient states he has had 2 or 3 episodes of abdominal pain since discharge but nothing ever really progressive.  He states he was unaware he should be consuming a low-fat diet.  He has pretty much ate whatever he wanted to.  He also has had alcohol at least 3 separate occasions since July.  He admits to half a pint of whiskey 3 days ago.Yesterday he developed worsening epigastric pain associated with multiple episodes of nonbloody emesis.  Symptoms similar to prior episode of pancreatitis.  No significant lower GI symptoms.    CT abdomen and pelvis with contrast yesterday showing persistent changes surrounding the pancreas consistent with pancreatitis.  No definite necrosis.  The collection noted along the mid body is less prominent now measuring 2.9 x 1.5 cm, previously measured 3.7 x 2.7 cm.  The collection surrounded celiac  axis is not well appreciated consistent with resolution.  Focal inflammatory changes are noted surrounding the head with some associated involvement of the adjacent duodenum.  The fluid collection adjacent to the tail is also decreased in size significantly.  In the ED his potassium was 2.6, glucose 128, LFTs normal, lipase 82, white blood cell count 14,900, hemoglobin 16.5.  Lipase 101 today.  White blood cell count 10,600, hemoglobin 13.1, LFTs unremarkable, potassium 3.1.  Magnesium 1.6.  Prior to Admission medications   Medication Sig Start Date End Date Taking? Authorizing Provider  amitriptyline (ELAVIL) 25 MG tablet Take 1 tablet (25 mg total) by mouth at bedtime. Patient taking differently: Take 25 mg by mouth at bedtime as needed for sleep.  01/03/18  Yes Kathie Dike, MD  Ca Carbonate-Mag Hydroxide (ROLAIDS) 550-110 MG CHEW Chew 2 tablets by mouth daily as needed (heart burn / acid reflux.).    Yes [provider]  oxyCODONE 10 MG TABS Take 1 tablet (10 mg total) by mouth every 6 (six) hours as needed for severe pain. 01/03/18  Yes Kathie Dike, MD  amoxicillin-clavulanate (AUGMENTIN) 500-125 MG tablet Take 1 tablet (500 mg total) by mouth 2 (two) times daily. Patient not taking: Reported on 02/27/2018 01/03/18   Kathie Dike, MD  metoprolol tartrate (LOPRESSOR) 25 MG tablet Take 1 tablet (25 mg total) by mouth 2 (two) times daily. Patient not taking: Reported on 02/27/2018 01/03/18   Kathie Dike, MD  senna (SENOKOT) 8.6 MG TABS tablet Take 1 tablet (8.6 mg total) by mouth 2 (two) times daily with a meal. Patient not taking: Reported on 02/27/2018 01/03/18   Memon,  Jolaine Artist, MD    Current Facility-Administered Medications  Medication Dose Route Frequency Provider Last Rate Last Dose  . 0.45 % NaCl with KCl 20 mEq / L infusion   Intravenous Continuous Heath Lark D, DO 100 mL/hr at 02/27/18 1950    . acetaminophen (TYLENOL) tablet 650 mg  650 mg Oral Q6H PRN Manuella Ghazi, Pratik D, DO        Or  . acetaminophen (TYLENOL) suppository 650 mg  650 mg Rectal Q6H PRN Manuella Ghazi, Pratik D, DO      . diphenhydrAMINE (BENADRYL) injection 12.5 mg  12.5 mg Intravenous Q6H PRN Manuella Ghazi, Pratik D, DO       Or  . diphenhydrAMINE (BENADRYL) 12.5 MG/5ML elixir 12.5 mg  12.5 mg Oral Q6H PRN Manuella Ghazi, Pratik D, DO      . famotidine (PEPCID) IVPB 20 mg premix  20 mg Intravenous Q12H Shah, Pratik D, DO 100 mL/hr at 02/27/18 2000 20 mg at 02/27/18 2000  . HYDROmorphone (DILAUDID) 1 mg/mL PCA injection   Intravenous Q4H Shah, Pratik D, DO   1 mg at 02/27/18 1936  . LORazepam (ATIVAN) injection 1 mg  1 mg Intravenous Q6H PRN Manuella Ghazi, Pratik D, DO      . magnesium sulfate IVPB 2 g 50 mL  2 g Intravenous Once Shah, Pratik D, DO      . naloxone [facility]) injection 0.4 mg  0.4 mg Intravenous PRN Manuella Ghazi, Pratik D, DO       And  . sodium chloride flush (NS) 0.9 % injection 9 mL  9 mL Intravenous PRN Manuella Ghazi, Pratik D, DO      . nicotine (NICODERM CQ - dosed in mg/24 hr) patch 7 mg  7 mg Transdermal Daily Manuella Ghazi, Pratik D, DO      . ondansetron (ZOFRAN) tablet 4 mg  4 mg Oral Q6H PRN Manuella Ghazi, Pratik D, DO       Or  . ondansetron (ZOFRAN) injection 4 mg  4 mg Intravenous Q6H PRN Manuella Ghazi, Pratik D, DO      . ondansetron (ZOFRAN) injection 4 mg  4 mg Intravenous Q6H PRN Manuella Ghazi, Pratik D, DO      . sodium chloride 0.9 % bolus 2,000 mL  2,000 mL Intravenous Once Manuella Ghazi, Pratik D, DO        Allergies as of 02/27/2018  . (No Known Allergies)    Past Medical History:  Diagnosis Date  . Pancreatitis     History reviewed. No pertinent surgical history.  Family History  Problem Relation Age of Onset  . Cancer Other     Social History   Socioeconomic History  . Marital status: Single    Spouse name: Not on file  . Number of children: Not on file  . Years of education: Not on file  . Highest education level: Not on file  Occupational History  . Not on file  Social Needs  . Financial resource strain: Patient refused  . Food  insecurity:    Worry: Patient refused    Inability: Patient refused  . Transportation needs:    Medical: Patient refused    Non-medical: Patient refused  Tobacco Use  . Smoking status: Current Every Day Smoker    Packs/day: 1.00    Types: Cigarettes  . Smokeless tobacco: Never Used  Substance and Sexual Activity  . Alcohol use: Yes    Comment: 3 days ago  . Drug use: Yes    Types: Marijuana    Comment: occasionally  . Sexual activity:  Not on file  Lifestyle  . Physical activity:    Days per week: Patient refused    Minutes per session: Patient refused  . Stress: Patient refused  Relationships  . Social connections:    Talks on phone: Patient refused    Gets together: Patient refused    Attends religious service: Patient refused    Active member of club or organization: Patient refused    Attends meetings of clubs or organizations: Patient refused    Relationship status: Patient refused  . Intimate partner violence:    Fear of current or ex partner: Patient refused    Emotionally abused: Patient refused    Physically abused: Patient refused    Forced sexual activity: Patient refused  Other Topics Concern  . Not on file  Social History Narrative  . Not on file     ROS:  General: Negative for anorexia, weight loss, fever, chills, fatigue, weakness. Eyes: Negative for vision changes.  ENT: Negative for hoarseness, difficulty swallowing , nasal congestion. CV: Negative for chest pain, angina, palpitations, dyspnea on exertion, peripheral edema.  Respiratory: Negative for dyspnea at rest, dyspnea on exertion, cough, sputum, wheezing.  GI: See history of present illness. GU:  Negative for dysuria, hematuria, urinary incontinence, urinary frequency, nocturnal urination.  MS: Negative for joint pain, low back pain.  Derm: Negative for rash or itching.  Neuro: Negative for weakness, abnormal sensation, seizure, frequent headaches, memory loss, confusion.  Psych: Negative  for anxiety, depression, suicidal ideation, hallucinations.  Endo: Negative for unusual weight change.  Heme: Negative for bruising or bleeding. Allergy: Negative for rash or hives.       Physical Examination: Vital signs in last 24 hours: Temp:  [97.7 F (36.5 C)-98.3 F (36.8 C)] 98.1 F (36.7 C) (09/26 0700) Pulse Rate:  [47-76] 54 (09/26 0700) Resp:  [12-25] 16 (09/26 0700) BP: (112-157)/(79-112) 115/79 (09/26 0700) SpO2:  [97 %-100 %] 99 % (09/26 0700) Weight:  [83 kg-83.5 kg] 83 kg (09/25 1856) Last BM Date: 02/27/18  General: Well-nourished, well-developed in no acute distress.  Head: Normocephalic, atraumatic.   Eyes: Conjunctiva pink, no icterus. Mouth: Oropharyngeal mucosa moist and pink , no lesions erythema or exudate. Neck: Supple without thyromegaly, masses, or lymphadenopathy.  Lungs: Clear to auscultation bilaterally.  Heart: Regular rate and rhythm, no murmurs rubs or gallops.  Abdomen: Bowel sounds are normal, moderate epigastric tenderness, nondistended, no hepatosplenomegaly or masses, no abdominal bruits or    hernia , no rebound or guarding.   Rectal: Not performed Extremities: No lower extremity edema, clubbing, deformity.  Neuro: Alert and oriented x 4 , grossly normal neurologically.  Skin: Warm and dry, no rash or jaundice.   Psych: Alert and cooperative, normal mood and affect.        Intake/Output from previous day: 09/25 0701 - 09/26 0700 In: 2742.4 [I.V.:498.8; IV Piggyback:2243.6] Out: -  Intake/Output this shift: No intake/output data recorded.  Lab Results: CBC Recent Labs    02/27/18 1253 02/28/18 0511  WBC 14.9* 10.6*  HGB 16.5 13.1  HCT 47.0 39.5  MCV 84.2 85.9  PLT 296 233   BMET Recent Labs    02/27/18 1253 02/28/18 0511  NA 137 138  K 2.6* 3.1*  CL 95* 101  CO2 29 27  GLUCOSE 128* 102*  BUN 14 11  CREATININE 0.99 0.77  CALCIUM 9.3 8.2*   LFT Recent Labs    02/27/18 1253 02/28/18 0511  BILITOT 1.2 0.7   ALKPHOS 88 66  AST 39 23  ALT 42 28  PROT 8.1 6.4*  ALBUMIN 4.6 3.4*    Lipase Recent Labs    02/27/18 1253 02/28/18 0511  LIPASE 82* 101*    PT/INR No results for input(s): LABPROT, INR in the last 72 hours.    Imaging Studies: Ct Abdomen Pelvis W Contrast  Result Date: 02/27/2018 CLINICAL DATA:  Upper abdominal pain for 2 days, history of pancreatitis EXAM: CT ABDOMEN AND PELVIS WITH CONTRAST TECHNIQUE: Multidetector CT imaging of the abdomen and pelvis was performed using the standard protocol following bolus administration of intravenous contrast. CONTRAST:  143mL ISOVUE-300 IOPAMIDOL (ISOVUE-300) INJECTION 61% COMPARISON:  01/03/2018 FINDINGS: Lower chest: No acute abnormality. Hepatobiliary: The gallbladder is again well visualized and within normal limits. The liver again demonstrates geographic areas of decreased attenuation consistent with fatty infiltration. Pancreas: Persistent changes surrounding the pancreas consistent with pancreatitis. No definitive pancreatic necrosis is seen. The collection noted along the midbody is less prominent now measuring 2.9 x 1.5 cm. It previously measured 3.7 x 2.7 cm. The collection surrounding the celiac axis is not well appreciated consistent with resolution. Local inflammatory changes are noted surrounding the head with some associated involvement of the adjacent duodenum. The fluid collection adjacent to the tail has also decreased in size significantly. Spleen: Normal in size without focal abnormality. Adrenals/Urinary Tract: Adrenal glands are within normal limits. The kidneys are well visualized bilaterally. No renal calculi or obstructive changes are seen. Left renal cyst is noted and stable. The bladder is decompressed. Stomach/Bowel: No obstructive or inflammatory changes of the larger small-bowel are seen with the exception of the changes surrounding the first and second portion of the duodenum. The appendix is within normal limits. The  stomach is unremarkable. Vascular/Lymphatic: No significant vascular findings are present. No enlarged abdominal or pelvic lymph nodes. Reproductive: Prostate is unremarkable. Other: No abdominal wall hernia or abnormality. No abdominopelvic ascites. Musculoskeletal: No acute or significant osseous findings. IMPRESSION: Changes of acute pancreatitis superimposed on more chronic appearing changes (fluid collections) which are resolving when compared with the prior exam. The acute changes involve primarily the head and uncinate process. Some localized inflammatory change of the adjacent duodenum is noted as well. Stable geographic hepatic steatosis. Electronically Signed   By: Inez Catalina M.D.   On: 02/27/2018 16:06  [4 week]   Impression: 32 year old gentleman with acute pancreatitis superimposed on chronic changes (fluid collections) which are improving, now with more acute changes in the pancreatic head and uncinate process.  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  Ongoing alcohol use.  Continues to eat high fat diet as well.  Supportive measures.  Patient was ordered 3000 cc bolus (only 1000cc given) since admission current fluids at 100 cc/h.  Improvement in leukocytosis and reduced hemoglobin.  Plan: 1. Continue supportive measures with fluid management and pain control. 2. Consider pancreatic enzymes when oral intake started. He is currently uninsured but would likely qualify for patient assistance. Will provide papers for patient to complete.  3. Will need MRI pancreas as outpatient once acute pancreatitis resolves, to better evaluate pancreas and follow fluid collections.    We would like to thank you for the opportunity to participate in the care of [employer].  Laureen Ochs. Bernarda Caffey [facility] [phone] 9/26/201910:24 AM     LOS: 1 day

## 2018-03-01 DIAGNOSIS — K859 Acute pancreatitis without necrosis or infection, unspecified: Secondary | ICD-10-CM

## 2018-03-01 DIAGNOSIS — R112 Nausea with vomiting, unspecified: Secondary | ICD-10-CM

## 2018-03-01 DIAGNOSIS — F1010 Alcohol abuse, uncomplicated: Secondary | ICD-10-CM

## 2018-03-01 DIAGNOSIS — I10 Essential (primary) hypertension: Secondary | ICD-10-CM

## 2018-03-01 DIAGNOSIS — E876 Hypokalemia: Secondary | ICD-10-CM

## 2018-03-01 DIAGNOSIS — Z720 Tobacco use: Secondary | ICD-10-CM

## 2018-03-01 LAB — COMPREHENSIVE METABOLIC PANEL
ALT: 30 U/L (ref 0–44)
AST: 23 U/L (ref 15–41)
Albumin: 4 g/dL (ref 3.5–5.0)
Alkaline Phosphatase: 76 U/L (ref 38–126)
Anion gap: 8 (ref 5–15)
BUN: 5 mg/dL — ABNORMAL LOW (ref 6–20)
CO2: 28 mmol/L (ref 22–32)
Calcium: 9.2 mg/dL (ref 8.9–10.3)
Chloride: 101 mmol/L (ref 98–111)
Creatinine, Ser: 0.65 mg/dL (ref 0.61–1.24)
GFR calc Af Amer: >60 mL/min (ref >60)
GFR calc non Af Amer: >60 mL/min (ref >60)
Glucose, Bld: 79 mg/dL (ref 70–99)
Potassium: 3.5 mmol/L (ref 3.5–5.1)
Sodium: 137 mmol/L (ref 135–145)
Total Bilirubin: 1 mg/dL (ref 0.3–1.2)
Total Protein: 7.7 g/dL (ref 6.5–8.1)

## 2018-03-01 LAB — CBC WITH DIFFERENTIAL/PLATELET
Basophils Absolute: 0 10*3/uL (ref 0.0–0.1)
Basophils Relative: 0 %
Eosinophils Absolute: 0.3 10*3/uL (ref 0.0–0.7)
Eosinophils Relative: 3 %
HCT: 43.7 % (ref 39.0–52.0)
Hemoglobin: 15 g/dL (ref 13.0–17.0)
Lymphocytes Relative: 27 %
Lymphs Abs: 2.6 10*3/uL (ref 0.7–4.0)
MCH: 29.3 pg (ref 26.0–34.0)
MCHC: 34.3 g/dL (ref 30.0–36.0)
MCV: 85.4 fL (ref 78.0–100.0)
Monocytes Absolute: 0.7 10*3/uL (ref 0.1–1.0)
Monocytes Relative: 7 %
Neutro Abs: 6 10*3/uL (ref 1.7–7.7)
Neutrophils Relative %: 63 %
Platelets: 252 10*3/uL (ref 150–400)
RBC: 5.12 MIL/uL (ref 4.22–5.81)
RDW: 14.4 % (ref 11.5–15.5)
WBC: 9.7 10*3/uL (ref 4.0–10.5)

## 2018-03-01 LAB — MAGNESIUM: Magnesium: 2 mg/dL (ref 1.7–2.4)

## 2018-03-01 LAB — LIPASE, BLOOD: Lipase: 71 U/L — ABNORMAL HIGH (ref 11–51)

## 2018-03-01 MED ORDER — HYDROMORPHONE HCL 1 MG/ML IJ SOLN
1.0000 mg | INTRAMUSCULAR | Status: DC | PRN
  Administered 2018-03-01 – 2018-03-02 (×6): 1 mg via INTRAVENOUS
  Filled 2018-03-01 (×7): qty 1

## 2018-03-01 MED ORDER — HYDROMORPHONE HCL 1 MG/ML IJ SOLN: 0.5000 mg | INTRAMUSCULAR | Status: DC | PRN

## 2018-03-01 MED ORDER — HYDROMORPHONE 1 MG/ML IV SOLN
INTRAVENOUS | Status: AC
  Administered 2018-03-01: 4.2 mg via INTRAVENOUS
  Administered 2018-03-01: 2.4 mg via INTRAVENOUS

## 2018-03-01 MED ORDER — TRAZODONE HCL 50 MG PO TABS: 100.0000 mg | ORAL_TABLET | Freq: Every evening | ORAL | Status: DC | PRN

## 2018-03-01 MED ORDER — PANCRELIPASE (LIP-PROT-AMYL) 12000-38000 UNITS PO CPEP
48000.0000 [IU] | ORAL_CAPSULE | Freq: Three times a day (TID) | ORAL | Status: DC
  Administered 2018-03-01 – 2018-03-02 (×2): 48000 [IU] via ORAL
  Filled 2018-03-01 (×2): qty 4

## 2018-03-01 NOTE — Progress Notes (Signed)
PROGRESS NOTE    David Miranda  VEL:[id number] DOB: [birth year] DOA: 02/27/2018 PCP: Patient, No Pcp Per   Brief Narrative:   David Miranda is a 32 y.o. male with medical history significant for pancreatitis, alcohol abuse, tobacco abuse, and hypertension who presented to the ED with complaints of worsening, dull/achy epigastric abdominal pain that began yesterday afternoon.  He had some significant nausea and vomiting with 15 episodes of emesis prior to admission.  He was admitted with acute, recurrent pancreatitis secondary to poor dietary compliance as well as alcohol use.  He has been started on IV fluid as well as Dilaudid PCA with improvements in his symptoms noted thus far.  He has been seen by GI with recommendations to continue the same with outpatient MRI in the near future.  Assessment & Plan:   Principal Problem:   Acute alcoholic pancreatitis Active Problems:   Alcohol abuse   Non-intractable vomiting with nausea   Hypokalemia   Essential hypertension   Tobacco abuse   Acute pancreatitis   1. Acute on chronic alcoholic pancreatitis secondary to alcohol use and dietary noncompliance-improving.    Advance to soft diet today.  If he tolerates diet today, I anticipate that he can safely discharge home 9/28.  Appreciate GI involvement.  DC dilaudid PCA today.  2. Hypokalemia-resolved.  This was likely related to ongoing nausea and vomiting but that has resolved. 3. Hypomagnesemia - IV replacement was given. 4. Essential hypertension-controlled. Home metoprolol has been held due to ongoing bradycardia.  BP has been stable so far on no meds.  Follow.  5. Tobacco abuse.  Counseled on cessation.  Nicotine patch. 6. History of alcohol abuse.  Maintain on CIWA protocol with Ativan for agitation as needed.  DVT prophylaxis: SCDs Code Status: Full Family Communication: Discussed with girlfriend at bedside Disposition Plan: DC 9/28 if tolerates diet today   Consultants:    GI  Procedures:   None  Antimicrobials:   None   Subjective: Patient has no specific complaints.  He did tolerate small amount of supper with no pain.  He has not tried breakfast.    Objective: Vitals:   03/01/18 0302 03/01/18 0314 03/01/18 0722 03/01/18 0732  BP: 112/75  113/74   Pulse: 65  (!) 57   Resp:  (!) 22  12  Temp: 98.2 F (36.8 C)  97.9 F (36.6 C)   TempSrc: Oral  Oral   SpO2: 97% 98% 99% 100%  Weight:      Height:        Intake/Output Summary (Last 24 hours) at 03/01/2018 1100 Last data filed at 03/01/2018 0900 Gross per 24 hour  Intake 1474.07 ml  Output -  Net 1474.07 ml   Filed Weights   02/27/18 1203 02/27/18 1856  Weight: 83.5 kg 83 kg    Examination:  General exam: Appears calm and comfortable  Respiratory system: Clear to auscultation. Respiratory effort normal. Cardiovascular system: S1 & S2 heard. No JVD, murmurs, rubs, gallops or clicks. No pedal edema. Gastrointestinal system: Abdomen is nondistended, soft and mildly tender to deep palpation. No organomegaly or masses felt. Normal bowel sounds heard. Central nervous system: Alert and oriented. No focal neurological deficits. Extremities: Symmetric 5 x 5 power. Skin: No rashes, lesions or ulcers, tattoos throughout Psychiatry: Judgement and insight appear normal. Mood & affect appropriate.   Data Reviewed: I have personally reviewed following labs and imaging studies  CBC: Recent Labs  Lab 02/27/18 1253 02/28/18 0511 03/01/18 0750  WBC 14.9* 10.6*  9.7  NEUTROABS 11.0*  --  6.0  HGB 16.5 13.1 15.0  HCT 47.0 39.5 43.7  MCV 84.2 85.9 85.4  PLT 296 233 650   Basic Metabolic Panel: Recent Labs  Lab 02/27/18 1253 02/28/18 0511 03/01/18 0750  NA 137 138 137  K 2.6* 3.1* 3.5  CL 95* 101 101  CO2 29 27 28   GLUCOSE 128* 102* 79  BUN 14 11 5*  CREATININE 0.99 0.77 0.65  CALCIUM 9.3 8.2* 9.2  MG  --  1.6*  --    GFR: Estimated Creatinine Clearance: 142.5 mL/min (by C-G  formula based on SCr of 0.65 mg/dL). Liver Function Tests: Recent Labs  Lab 02/27/18 1253 02/28/18 0511 03/01/18 0750  AST 39 23 23  ALT 42 28 30  ALKPHOS 88 66 76  BILITOT 1.2 0.7 1.0  PROT 8.1 6.4* 7.7  ALBUMIN 4.6 3.4* 4.0   Recent Labs  Lab 02/27/18 1253 02/28/18 0511 03/01/18 0750  LIPASE 82* 101* 71*   No results for input(s): AMMONIA in the last 168 hours. Coagulation Profile: No results for input(s): INR, PROTIME in the last 168 hours. Cardiac Enzymes: No results for input(s): CKTOTAL, CKMB, CKMBINDEX, TROPONINI in the last 168 hours. BNP (last 3 results) No results for input(s): PROBNP in the last 8760 hours. HbA1C: No results for input(s): HGBA1C in the last 72 hours. CBG: No results for input(s): GLUCAP in the last 168 hours. Lipid Profile: No results for input(s): CHOL, HDL, LDLCALC, TRIG, CHOLHDL, LDLDIRECT in the last 72 hours. Thyroid Function Tests: No results for input(s): TSH, T4TOTAL, FREET4, T3FREE, THYROIDAB in the last 72 hours. Anemia Panel: No results for input(s): VITAMINB12, FOLATE, FERRITIN, TIBC, IRON, RETICCTPCT in the last 72 hours. Sepsis Labs: No results for input(s): PROCALCITON, LATICACIDVEN in the last 168 hours.  No results found for this or any previous visit (from the past 240 hour(s)).   Radiology Studies: Ct Abdomen Pelvis W Contrast  Result Date: 02/27/2018 CLINICAL DATA:  Upper abdominal pain for 2 days, history of pancreatitis EXAM: CT ABDOMEN AND PELVIS WITH CONTRAST TECHNIQUE: Multidetector CT imaging of the abdomen and pelvis was performed using the standard protocol following bolus administration of intravenous contrast. CONTRAST:  110mL ISOVUE-300 IOPAMIDOL (ISOVUE-300) INJECTION 61% COMPARISON:  01/03/2018 FINDINGS: Lower chest: No acute abnormality. Hepatobiliary: The gallbladder is again well visualized and within normal limits. The liver again demonstrates geographic areas of decreased attenuation consistent with fatty  infiltration. Pancreas: Persistent changes surrounding the pancreas consistent with pancreatitis. No definitive pancreatic necrosis is seen. The collection noted along the midbody is less prominent now measuring 2.9 x 1.5 cm. It previously measured 3.7 x 2.7 cm. The collection surrounding the celiac axis is not well appreciated consistent with resolution. Local inflammatory changes are noted surrounding the head with some associated involvement of the adjacent duodenum. The fluid collection adjacent to the tail has also decreased in size significantly. Spleen: Normal in size without focal abnormality. Adrenals/Urinary Tract: Adrenal glands are within normal limits. The kidneys are well visualized bilaterally. No renal calculi or obstructive changes are seen. Left renal cyst is noted and stable. The bladder is decompressed. Stomach/Bowel: No obstructive or inflammatory changes of the larger small-bowel are seen with the exception of the changes surrounding the first and second portion of the duodenum. The appendix is within normal limits. The stomach is unremarkable. Vascular/Lymphatic: No significant vascular findings are present. No enlarged abdominal or pelvic lymph nodes. Reproductive: Prostate is unremarkable. Other: No abdominal wall hernia or  abnormality. No abdominopelvic ascites. Musculoskeletal: No acute or significant osseous findings. IMPRESSION: Changes of acute pancreatitis superimposed on more chronic appearing changes (fluid collections) which are resolving when compared with the prior exam. The acute changes involve primarily the head and uncinate process. Some localized inflammatory change of the adjacent duodenum is noted as well. Stable geographic hepatic steatosis. Electronically Signed   By: Inez Catalina M.D.   On: 02/27/2018 16:06   Scheduled Meds: . HYDROmorphone   Intravenous Q4H  . nicotine  7 mg Transdermal Daily   Continuous Infusions: . 0.45 % NaCl with KCl 20 mEq / L 100 mL/hr at  03/01/18 0730  . sodium chloride       LOS: 2 days   Time spent: 25 minutes  Irwin Brakeman, MD [facility] Pager [phone]  If 7PM-7AM, please contact night-coverage [email or web address] Password [email or web address] 03/01/2018, 11:00 AM

## 2018-03-01 NOTE — Progress Notes (Signed)
Subjective: Still with some mid-abdominal pain (5/10). No N/V currently. No recent BM. No previously noted hematochezia or melena.  He states he was told his PCA pump would be discontinued at 3:00 and is concerned about what will happen if he has abdominal pain after the PCA is discontinued.  I reassured him that as needed pain management is ordered.  No other GI complaints.  Objective: Vital signs in last 24 hours: Temp:  [97.9 F (36.6 C)-98.4 F (36.9 C)] 97.9 F (36.6 C) (09/27 0722) Pulse Rate:  [57-73] 57 (09/27 0722) Resp:  [12-24] 15 (09/27 1131) BP: (112-134)/(74-89) 113/74 (09/27 0722) SpO2:  [96 %-100 %] 99 % (09/27 1131) Last BM Date: 02/27/18 General:   Alert and oriented, pleasant Head:  Normocephalic and atraumatic. Eyes:  No icterus, sclera clear. Conjuctiva pink.  Heart:  S1, S2 present, no murmurs noted.  Lungs: Clear to auscultation bilaterally, without wheezing, rales, or rhonchi.  Abdomen:  Bowel sounds present, soft, non-distended. Mild to moderate TTP noted. No HSM or hernias noted. No rebound or guarding. No masses appreciated  Msk:  Symmetrical without gross deformities. Pulses:  Normal bilateral DP pulses noted. Extremities:  Without clubbing or edema. Neurologic:  Alert and  oriented x4;  grossly normal neurologically. Psych:  Alert and cooperative. Normal mood and affect.  Intake/Output from previous day: 09/26 0701 - 09/27 0700 In: 1234.1 [P.O.:240; I.V.:901.3; IV Piggyback:92.8] Out: -  Intake/Output this shift: Total I/O In: 240 [P.O.:240] Out: -   Lab Results: Recent Labs    02/27/18 1253 02/28/18 0511 03/01/18 0750  WBC 14.9* 10.6* 9.7  HGB 16.5 13.1 15.0  HCT 47.0 39.5 43.7  PLT 296 233 252   BMET Recent Labs    02/27/18 1253 02/28/18 0511 03/01/18 0750  NA 137 138 137  K 2.6* 3.1* 3.5  CL 95* 101 101  CO2 29 27 28   GLUCOSE 128* 102* 79  BUN 14 11 5*  CREATININE 0.99 0.77 0.65  CALCIUM 9.3 8.2* 9.2   LFT Recent Labs     02/27/18 1253 02/28/18 0511 03/01/18 0750  PROT 8.1 6.4* 7.7  ALBUMIN 4.6 3.4* 4.0  AST 39 23 23  ALT 42 28 30  ALKPHOS 88 66 76  BILITOT 1.2 0.7 1.0   PT/INR No results for input(s): LABPROT, INR in the last 72 hours. Hepatitis Panel No results for input(s): HEPBSAG, HCVAB, HEPAIGM, HEPBIGM in the last 72 hours.   Studies/Results: Ct Abdomen Pelvis W Contrast  Result Date: 02/27/2018 CLINICAL DATA:  Upper abdominal pain for 2 days, history of pancreatitis EXAM: CT ABDOMEN AND PELVIS WITH CONTRAST TECHNIQUE: Multidetector CT imaging of the abdomen and pelvis was performed using the standard protocol following bolus administration of intravenous contrast. CONTRAST:  145mL ISOVUE-300 IOPAMIDOL (ISOVUE-300) INJECTION 61% COMPARISON:  01/03/2018 FINDINGS: Lower chest: No acute abnormality. Hepatobiliary: The gallbladder is again well visualized and within normal limits. The liver again demonstrates geographic areas of decreased attenuation consistent with fatty infiltration. Pancreas: Persistent changes surrounding the pancreas consistent with pancreatitis. No definitive pancreatic necrosis is seen. The collection noted along the midbody is less prominent now measuring 2.9 x 1.5 cm. It previously measured 3.7 x 2.7 cm. The collection surrounding the celiac axis is not well appreciated consistent with resolution. Local inflammatory changes are noted surrounding the head with some associated involvement of the adjacent duodenum. The fluid collection adjacent to the tail has also decreased in size significantly. Spleen: Normal in size without focal abnormality. Adrenals/Urinary Tract:  Adrenal glands are within normal limits. The kidneys are well visualized bilaterally. No renal calculi or obstructive changes are seen. Left renal cyst is noted and stable. The bladder is decompressed. Stomach/Bowel: No obstructive or inflammatory changes of the larger small-bowel are seen with the exception of the  changes surrounding the first and second portion of the duodenum. The appendix is within normal limits. The stomach is unremarkable. Vascular/Lymphatic: No significant vascular findings are present. No enlarged abdominal or pelvic lymph nodes. Reproductive: Prostate is unremarkable. Other: No abdominal wall hernia or abnormality. No abdominopelvic ascites. Musculoskeletal: No acute or significant osseous findings. IMPRESSION: Changes of acute pancreatitis superimposed on more chronic appearing changes (fluid collections) which are resolving when compared with the prior exam. The acute changes involve primarily the head and uncinate process. Some localized inflammatory change of the adjacent duodenum is noted as well. Stable geographic hepatic steatosis. Electronically Signed   By: Inez Catalina M.D.   On: 02/27/2018 16:06    Assessment: 32 year old gentleman with acute pancreatitis superimposed on chronic changes (fluid collections) which are improving, now with more acute changes in the pancreatic head and uncinate process.  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  Ongoing alcohol use.  Continues to eat high fat diet as well.  Improvement in leukocytosis and reduced hemoglobin. He is clinically improved and diet advanced to soft.  Lipase improved today, CBC normal, CMP essentially normal.  Still some abdominal pain. Tolerating a diet. PCA to be d/c today for prn pain management. Discussed need for absolute ETOH cessation. Will need outpatient MRI after acute pancreatitis resolves for further evaluate pancreas. Start pancreatic enzymes, no weight currently on the chart.  Plan: 1. Add pancreatic enzymes: 500 mg/kg/meal (48,000 U/meal) 2. Pain and nausea management per hospitalist 3. MRI pancreas as outpatient when acute issue resolved to evaluate fluid collections 4. Supportive measures 5. Will eventually need outpatient follow-up   Thank you for allowing [place] to participate in the care of [place], DNP, AGNP-C Adult & Gerontological Nurse Practitioner [facility]     LOS: 2 days    03/01/2018, 12:17 PM

## 2018-03-02 MED ORDER — HYDROCODONE-ACETAMINOPHEN 5-325 MG PO TABS: 1.0000 | ORAL_TABLET | ORAL | 0 refills | Status: DC | PRN

## 2018-03-02 MED ORDER — PANCRELIPASE (LIP-PROT-AMYL) 24000-76000 UNITS PO CPEP: 48000.0000 [IU] | ORAL_CAPSULE | Freq: Three times a day (TID) | ORAL | 1 refills | Status: DC

## 2018-03-02 MED ORDER — OMEPRAZOLE 20 MG PO CPDR: 20.0000 mg | DELAYED_RELEASE_CAPSULE | Freq: Every day | ORAL | 1 refills | Status: DC

## 2018-03-02 NOTE — Progress Notes (Signed)
Patient discharged home with personal belongings. IV removed and site intact. Patient discharged with prescriptions.

## 2018-03-02 NOTE — Discharge Summary (Signed)
Physician Discharge Summary  David Miranda ZJQ:[id number] DOB: 06-13-1985 DOA: 02/27/2018  PCP: Patient, No Pcp Per  Admit date: 02/27/2018 Discharge date: 03/02/2018  Admitted From: Home Disposition: Home  Recommendations for Outpatient Follow-up:  1. Follow up with PCP in 1-2 weeks 2. Follow-up with gastroenterology in 6 weeks.  Patient can pick up Creon at GI office.  Discharge Condition: Improved CODE STATUS: Full code Diet recommendation: Low-fat  Brief/Interim Summary: 32 year old male with a history of alcohol abuse, pancreatitis, was recently in the hospital for alcoholic pancreatitis episode.  He returned to the hospital with epigastric abdominal pain.  He was found to have acute alcoholic pancreatitis.  Initially treated with Dilaudid PCA.  This was changed to intermittent Dilaudid pushes.  He was seen by gastroenterology.  He was treated supportively with IV fluids and pain management.  Diet was slowly advanced and he is not tolerating a solid diet.  He is been started on pancreatic enzyme supplementation.  Is also been advised to continue a low-fat diet and abstain from any further alcohol.  Patient reports that he does have continued pain at this time, so will be discharged with a short course of hydrocodone.  [place] controlled substance database was reviewed and he has not had any controlled substances filled since his last discharge.  Although he does report continued pain, patient was able to frequently leave the medical floor during his hospital stay.  He was advised that pain management would not completely rate him of all pain, but would make his pain manageable so that he could function.  It appears that he is functional at this time.  He is been asked to follow-up with his primary care physician.  Discharge Diagnoses:  Principal Problem:   Acute alcoholic pancreatitis Active Problems:   Alcohol abuse   Non-intractable vomiting with nausea   Hypokalemia   Essential  hypertension   Tobacco abuse   Acute pancreatitis    Discharge Instructions  Discharge Instructions    Diet - low sodium heart healthy   Complete by:  As directed    Increase activity slowly   Complete by:  As directed      Allergies as of 03/02/2018   No Known Allergies     Medication List    STOP taking these medications   amitriptyline 25 MG tablet Commonly known as:  ELAVIL   amoxicillin-clavulanate 500-125 MG tablet Commonly known as:  AUGMENTIN   metoprolol tartrate 25 MG tablet Commonly known as:  LOPRESSOR   Oxycodone HCl 10 MG Tabs   ROLAIDS 550-110 MG Chew Generic drug:  Ca Carbonate-Mag Hydroxide   senna 8.6 MG Tabs tablet Commonly known as:  SENOKOT     TAKE these medications   HYDROcodone-acetaminophen 5-325 MG tablet Commonly known as:  NORCO/VICODIN Take 1 tablet by mouth every 4 (four) hours as needed for moderate pain.   omeprazole 20 MG capsule Commonly known as:  PRILOSEC Take 1 capsule (20 mg total) by mouth daily.   Pancrelipase (Lip-Prot-Amyl) 24000-76000 units Cpep Take 2 capsules (48,000 Units total) by mouth 3 (three) times daily with meals.      Follow-up Information    Fields, Marga Melnick, MD Follow up.   Specialty:  Gastroenterology Why:  follow up in office for samples of pancreatic enzymes.  No alcohol and keep a low fat diet Contact information: [address] [place] [address] [phone]          No Known Allergies  Consultations:  Gastroenterology   Procedures/Studies:  Ct Abdomen Pelvis W Contrast  Result Date: 02/27/2018 CLINICAL DATA:  Upper abdominal pain for 2 days, history of pancreatitis EXAM: CT ABDOMEN AND PELVIS WITH CONTRAST TECHNIQUE: Multidetector CT imaging of the abdomen and pelvis was performed using the standard protocol following bolus administration of intravenous contrast. CONTRAST:  147mL ISOVUE-300 IOPAMIDOL (ISOVUE-300) INJECTION 61% COMPARISON:  01/03/2018 FINDINGS: Lower chest:  No acute abnormality. Hepatobiliary: The gallbladder is again well visualized and within normal limits. The liver again demonstrates geographic areas of decreased attenuation consistent with fatty infiltration. Pancreas: Persistent changes surrounding the pancreas consistent with pancreatitis. No definitive pancreatic necrosis is seen. The collection noted along the midbody is less prominent now measuring 2.9 x 1.5 cm. It previously measured 3.7 x 2.7 cm. The collection surrounding the celiac axis is not well appreciated consistent with resolution. Local inflammatory changes are noted surrounding the head with some associated involvement of the adjacent duodenum. The fluid collection adjacent to the tail has also decreased in size significantly. Spleen: Normal in size without focal abnormality. Adrenals/Urinary Tract: Adrenal glands are within normal limits. The kidneys are well visualized bilaterally. No renal calculi or obstructive changes are seen. Left renal cyst is noted and stable. The bladder is decompressed. Stomach/Bowel: No obstructive or inflammatory changes of the larger small-bowel are seen with the exception of the changes surrounding the first and second portion of the duodenum. The appendix is within normal limits. The stomach is unremarkable. Vascular/Lymphatic: No significant vascular findings are present. No enlarged abdominal or pelvic lymph nodes. Reproductive: Prostate is unremarkable. Other: No abdominal wall hernia or abnormality. No abdominopelvic ascites. Musculoskeletal: No acute or significant osseous findings. IMPRESSION: Changes of acute pancreatitis superimposed on more chronic appearing changes (fluid collections) which are resolving when compared with the prior exam. The acute changes involve primarily the head and uncinate process. Some localized inflammatory change of the adjacent duodenum is noted as well. Stable geographic hepatic steatosis. Electronically Signed   By: Inez Catalina M.D.   On: 02/27/2018 16:06       Subjective: Reports continued abdominal pain, but overall better.  Tolerating solid diet.  Discharge Exam: Vitals:   03/01/18 1607 03/01/18 2141 03/02/18 0542 03/02/18 1325  BP:  138/88 116/74 (!) 130/99  Pulse:  61 (!) 50 78  Resp: 18 18 18 16   Temp:  98.9 F (37.2 C) 98.2 F (36.8 C) 98.5 F (36.9 C)  TempSrc:  Oral Oral Oral  SpO2: 98% 100% 98% 97%  Weight:      Height:        General: Pt is alert, awake, not in acute distress Cardiovascular: RRR, S1/S2 +, no rubs, no gallops Respiratory: CTA bilaterally, no wheezing, no rhonchi Abdominal: Soft, NT, ND, bowel sounds + Extremities: no edema, no cyanosis    The results of significant diagnostics from this hospitalization (including imaging, microbiology, ancillary and laboratory) are listed below for reference.     Microbiology: No results found for this or any previous visit (from the past 240 hour(s)).   Labs: BNP (last 3 results) No results for input(s): BNP in the last 8760 hours. Basic Metabolic Panel: Recent Labs  Lab 02/27/18 1253 02/28/18 0511 03/01/18 0750 03/01/18 1109  NA 137 138 137  --   K 2.6* 3.1* 3.5  --   CL 95* 101 101  --   CO2 29 27 28   --   GLUCOSE 128* 102* 79  --   BUN 14 11 5*  --   CREATININE 0.99 0.77  0.65  --   CALCIUM 9.3 8.2* 9.2  --   MG  --  1.6*  --  2.0   Liver Function Tests: Recent Labs  Lab 02/27/18 1253 02/28/18 0511 03/01/18 0750  AST 39 23 23  ALT 42 28 30  ALKPHOS 88 66 76  BILITOT 1.2 0.7 1.0  PROT 8.1 6.4* 7.7  ALBUMIN 4.6 3.4* 4.0   Recent Labs  Lab 02/27/18 1253 02/28/18 0511 03/01/18 0750  LIPASE 82* 101* 71*   No results for input(s): AMMONIA in the last 168 hours. CBC: Recent Labs  Lab 02/27/18 1253 02/28/18 0511 03/01/18 0750  WBC 14.9* 10.6* 9.7  NEUTROABS 11.0*  --  6.0  HGB 16.5 13.1 15.0  HCT 47.0 39.5 43.7  MCV 84.2 85.9 85.4  PLT 296 233 252   Cardiac Enzymes: No results for  input(s): CKTOTAL, CKMB, CKMBINDEX, TROPONINI in the last 168 hours. BNP: Invalid input(s): POCBNP CBG: No results for input(s): GLUCAP in the last 168 hours. D-Dimer No results for input(s): DDIMER in the last 72 hours. Hgb A1c No results for input(s): HGBA1C in the last 72 hours. Lipid Profile No results for input(s): CHOL, HDL, LDLCALC, TRIG, CHOLHDL, LDLDIRECT in the last 72 hours. Thyroid function studies No results for input(s): TSH, T4TOTAL, T3FREE, THYROIDAB in the last 72 hours.  Invalid input(s): FREET3 Anemia work up No results for input(s): VITAMINB12, FOLATE, FERRITIN, TIBC, IRON, RETICCTPCT in the last 72 hours. Urinalysis    Component Value Date/Time   COLORURINE AMBER (A) 02/27/2018 1240   APPEARANCEUR HAZY (A) 02/27/2018 1240   LABSPEC 1.036 (H) 02/27/2018 1240   PHURINE 7.0 02/27/2018 1240   GLUCOSEU NEGATIVE 02/27/2018 1240   HGBUR NEGATIVE 02/27/2018 1240   BILIRUBINUR MODERATE (A) 02/27/2018 1240   KETONESUR NEGATIVE 02/27/2018 1240   PROTEINUR 100 (A) 02/27/2018 1240   NITRITE NEGATIVE 02/27/2018 1240   LEUKOCYTESUR NEGATIVE 02/27/2018 1240   Sepsis Labs Invalid input(s): PROCALCITONIN,  WBC,  LACTICIDVEN Microbiology No results found for this or any previous visit (from the past 240 hour(s)).   Time coordinating discharge: 84mins  SIGNED:   Kathie Dike, MD  [facility] 03/02/2018, 7:14 PM Pager   If 7PM-7AM, please contact night-coverage [email or web address] Password [email or web address]

## 2018-03-02 NOTE — Progress Notes (Signed)
Patient ID: David Miranda, male   DOB: 09/28/1985, 32 y.o.   MRN: [id number]  Assessment/Plan: Admitted with ETOHIC  Pancreatitis. CLINICALLY IMPROVED.  PLAN:  1. D/C WITH PAIN MEDS 2. PT MAY PICK UP CREON AT [facility]. 3. OPV W/ RGA IN 4-6 WEEKS 4. PT UNDERSTANDS HE SHOULD FOLLOW A LOW FAT DIET AND NO ETOH AT ALL.  Subjective: Since I last evaluated the patient he denies NAUSEA/VOMITING. Pain is improved. Passing gas.  Objective: Vital signs in last 24 hours: Vitals:   03/01/18 2141 03/02/18 0542  BP: 138/88 116/74  Pulse: 61 (!) 50  Resp: 18 18  Temp: 98.9 F (37.2 C) 98.2 F (36.8 C)  SpO2: 100% 98%   General appearance: alert, cooperative and no distress Resp: clear to auscultation bilaterally Cardio: regular rate and rhythm GI: soft, MILDLY tender IN THE EPIGASTRIUM; bowel sounds normal; NO REBOUND OR GUARDING   Lab Results:  none  Studies/Results: No results found.  Medications: I have reviewed the patient's current medications.   LOS: 5 days   Barney Drain 11/13/2013, 2:23 PM

## 2018-03-26 ENCOUNTER — Other Ambulatory Visit: Payer: Self-pay

## 2018-03-26 ENCOUNTER — Emergency Department (HOSPITAL_COMMUNITY): Payer: Self-pay

## 2018-03-26 ENCOUNTER — Inpatient Hospital Stay (HOSPITAL_COMMUNITY)
Admission: EM | Admit: 2018-03-26 | Discharge: 2018-03-29 | DRG: 439 | Disposition: A | Discharge status: Home / Self Care | Payer: Self-pay | Attending: Internal Medicine | Admitting: Internal Medicine

## 2018-03-26 ENCOUNTER — Encounter (HOSPITAL_COMMUNITY): Payer: Self-pay | Admitting: Emergency Medicine

## 2018-03-26 DIAGNOSIS — K861 Other chronic pancreatitis: Secondary | ICD-10-CM

## 2018-03-26 DIAGNOSIS — I10 Essential (primary) hypertension: Secondary | ICD-10-CM | POA: Diagnosis present

## 2018-03-26 DIAGNOSIS — K219 Gastro-esophageal reflux disease without esophagitis: Secondary | ICD-10-CM | POA: Diagnosis present

## 2018-03-26 DIAGNOSIS — F1210 Cannabis abuse, uncomplicated: Secondary | ICD-10-CM

## 2018-03-26 DIAGNOSIS — K860 Alcohol-induced chronic pancreatitis: Secondary | ICD-10-CM | POA: Diagnosis present

## 2018-03-26 DIAGNOSIS — K859 Acute pancreatitis without necrosis or infection, unspecified: Secondary | ICD-10-CM | POA: Diagnosis present

## 2018-03-26 DIAGNOSIS — Z720 Tobacco use: Secondary | ICD-10-CM | POA: Diagnosis present

## 2018-03-26 DIAGNOSIS — F1010 Alcohol abuse, uncomplicated: Secondary | ICD-10-CM | POA: Diagnosis present

## 2018-03-26 DIAGNOSIS — N179 Acute kidney failure, unspecified: Secondary | ICD-10-CM | POA: Diagnosis present

## 2018-03-26 DIAGNOSIS — E860 Dehydration: Secondary | ICD-10-CM | POA: Diagnosis present

## 2018-03-26 DIAGNOSIS — K852 Alcohol induced acute pancreatitis without necrosis or infection: Principal | ICD-10-CM | POA: Diagnosis present

## 2018-03-26 DIAGNOSIS — E871 Hypo-osmolality and hyponatremia: Secondary | ICD-10-CM | POA: Diagnosis present

## 2018-03-26 DIAGNOSIS — R112 Nausea with vomiting, unspecified: Secondary | ICD-10-CM | POA: Diagnosis present

## 2018-03-26 DIAGNOSIS — F17210 Nicotine dependence, cigarettes, uncomplicated: Secondary | ICD-10-CM | POA: Diagnosis present

## 2018-03-26 DIAGNOSIS — K760 Fatty (change of) liver, not elsewhere classified: Secondary | ICD-10-CM | POA: Diagnosis present

## 2018-03-26 DIAGNOSIS — E876 Hypokalemia: Secondary | ICD-10-CM | POA: Diagnosis present

## 2018-03-26 LAB — COMPREHENSIVE METABOLIC PANEL
ALT: 34 U/L (ref 0–44)
AST: 46 U/L — ABNORMAL HIGH (ref 15–41)
Albumin: 5.3 g/dL — ABNORMAL HIGH (ref 3.5–5.0)
Alkaline Phosphatase: 95 U/L (ref 38–126)
BUN: 46 mg/dL — ABNORMAL HIGH (ref 6–20)
CO2: 37 mmol/L — ABNORMAL HIGH (ref 22–32)
Calcium: 10.1 mg/dL (ref 8.9–10.3)
Chloride: 65 mmol/L — ABNORMAL LOW (ref 98–111)
Creatinine, Ser: 3.42 mg/dL — ABNORMAL HIGH (ref 0.61–1.24)
GFR calc Af Amer: 26 mL/min — ABNORMAL LOW (ref >60)
GFR calc non Af Amer: 22 mL/min — ABNORMAL LOW (ref >60)
Glucose, Bld: 210 mg/dL — ABNORMAL HIGH (ref 70–99)
Potassium: <2 mmol/L — CL (ref 3.5–5.1)
Sodium: 132 mmol/L — ABNORMAL LOW (ref 135–145)
Total Bilirubin: 1.5 mg/dL — ABNORMAL HIGH (ref 0.3–1.2)
Total Protein: 10.1 g/dL — ABNORMAL HIGH (ref 6.5–8.1)

## 2018-03-26 LAB — CBC WITH DIFFERENTIAL/PLATELET
Abs Immature Granulocytes: 0.04 10*3/uL (ref 0.00–0.07)
Basophils Absolute: 0.1 10*3/uL (ref 0.0–0.1)
Basophils Relative: 0 %
Eosinophils Absolute: 0.2 10*3/uL (ref 0.0–0.5)
Eosinophils Relative: 1 %
HCT: 52.8 % — ABNORMAL HIGH (ref 39.0–52.0)
Hemoglobin: 18 g/dL — ABNORMAL HIGH (ref 13.0–17.0)
Immature Granulocytes: 0 %
Lymphocytes Relative: 16 %
Lymphs Abs: 1.9 10*3/uL (ref 0.7–4.0)
MCH: 27.1 pg (ref 26.0–34.0)
MCHC: 34.1 g/dL (ref 30.0–36.0)
MCV: 79.5 fL — ABNORMAL LOW (ref 80.0–100.0)
Monocytes Absolute: 1.1 10*3/uL — ABNORMAL HIGH (ref 0.1–1.0)
Monocytes Relative: 9 %
Neutro Abs: 8.9 10*3/uL — ABNORMAL HIGH (ref 1.7–7.7)
Neutrophils Relative %: 74 %
Platelets: 350 10*3/uL (ref 150–400)
RBC: 6.64 MIL/uL — ABNORMAL HIGH (ref 4.22–5.81)
RDW: 15.7 % — ABNORMAL HIGH (ref 11.5–15.5)
WBC: 12.2 10*3/uL — ABNORMAL HIGH (ref 4.0–10.5)
nRBC: 0 % (ref 0.0–0.2)

## 2018-03-26 LAB — MAGNESIUM: Magnesium: 1.6 mg/dL — ABNORMAL LOW (ref 1.7–2.4)

## 2018-03-26 LAB — URINALYSIS, ROUTINE W REFLEX MICROSCOPIC
Bacteria, UA: NONE SEEN
Bilirubin Urine: NEGATIVE
Glucose, UA: NEGATIVE mg/dL
Hgb urine dipstick: NEGATIVE
Ketones, ur: 5 mg/dL — AB
Leukocytes, UA: NEGATIVE
Nitrite: NEGATIVE
Protein, ur: 100 mg/dL — AB
Specific Gravity, Urine: 1.021 (ref 1.005–1.030)
pH: 5 (ref 5.0–8.0)

## 2018-03-26 LAB — PHOSPHORUS: Phosphorus: 5.1 mg/dL — ABNORMAL HIGH (ref 2.5–4.6)

## 2018-03-26 LAB — LIPASE, BLOOD: Lipase: 341 U/L — ABNORMAL HIGH (ref 11–51)

## 2018-03-26 MED ORDER — METOCLOPRAMIDE HCL 5 MG/ML IJ SOLN
10.0000 mg | Freq: Once | INTRAMUSCULAR | Status: AC
  Administered 2018-03-26: 10 mg via INTRAVENOUS
  Filled 2018-03-26: qty 2

## 2018-03-26 MED ORDER — NICOTINE 21 MG/24HR TD PT24
21.0000 mg | MEDICATED_PATCH | Freq: Every day | TRANSDERMAL | Status: DC
  Administered 2018-03-26 – 2018-03-29 (×4): 21 mg via TRANSDERMAL
  Filled 2018-03-26 (×4): qty 1

## 2018-03-26 MED ORDER — HYDROMORPHONE HCL 1 MG/ML IJ SOLN
1.0000 mg | Freq: Once | INTRAMUSCULAR | Status: AC
  Administered 2018-03-26: 1 mg via INTRAVENOUS
  Filled 2018-03-26: qty 1

## 2018-03-26 MED ORDER — LACTATED RINGERS IV BOLUS
1000.0000 mL | Freq: Once | INTRAVENOUS | Status: AC
  Administered 2018-03-26: 1000 mL via INTRAVENOUS

## 2018-03-26 MED ORDER — POTASSIUM CHLORIDE 10 MEQ/100ML IV SOLN
10.0000 meq | INTRAVENOUS | Status: AC
  Administered 2018-03-26 (×4): 10 meq via INTRAVENOUS
  Filled 2018-03-26 (×4): qty 100

## 2018-03-26 MED ORDER — POTASSIUM CHLORIDE CRYS ER 20 MEQ PO TBCR
40.0000 meq | EXTENDED_RELEASE_TABLET | Freq: Once | ORAL | Status: AC
  Administered 2018-03-26: 40 meq via ORAL
  Filled 2018-03-26: qty 2

## 2018-03-26 MED ORDER — ONDANSETRON HCL 4 MG PO TABS: 4.0000 mg | ORAL_TABLET | Freq: Four times a day (QID) | ORAL | Status: DC | PRN

## 2018-03-26 MED ORDER — ALUM & MAG HYDROXIDE-SIMETH 200-200-20 MG/5ML PO SUSP
30.0000 mL | ORAL | Status: DC | PRN
  Administered 2018-03-26 – 2018-03-28 (×4): 30 mL via ORAL
  Filled 2018-03-26 (×4): qty 30

## 2018-03-26 MED ORDER — POTASSIUM CHLORIDE IN NACL 40-0.9 MEQ/L-% IV SOLN
INTRAVENOUS | Status: DC
  Administered 2018-03-26 – 2018-03-29 (×7): 100 mL/h via INTRAVENOUS
  Filled 2018-03-26 (×3): qty 1000

## 2018-03-26 MED ORDER — HEPARIN SODIUM (PORCINE) 5000 UNIT/ML IJ SOLN
5000.0000 [IU] | Freq: Three times a day (TID) | INTRAMUSCULAR | Status: DC
  Administered 2018-03-26: 5000 [IU] via SUBCUTANEOUS
  Filled 2018-03-26 (×3): qty 1

## 2018-03-26 MED ORDER — MORPHINE SULFATE (PF) 2 MG/ML IV SOLN
2.0000 mg | INTRAVENOUS | Status: DC | PRN
  Administered 2018-03-26 (×2): 2 mg via INTRAVENOUS
  Filled 2018-03-26 (×2): qty 1

## 2018-03-26 MED ORDER — VITAMIN B-1 100 MG PO TABS
100.0000 mg | ORAL_TABLET | Freq: Every day | ORAL | Status: DC
  Administered 2018-03-27 – 2018-03-29 (×3): 100 mg via ORAL
  Filled 2018-03-26 (×3): qty 1

## 2018-03-26 MED ORDER — ONDANSETRON HCL 4 MG/2ML IJ SOLN
4.0000 mg | Freq: Four times a day (QID) | INTRAMUSCULAR | Status: DC | PRN
  Administered 2018-03-26: 4 mg via INTRAVENOUS
  Filled 2018-03-26: qty 2

## 2018-03-26 MED ORDER — SODIUM CHLORIDE 0.9 % IV BOLUS
1000.0000 mL | Freq: Once | INTRAVENOUS | Status: AC
  Administered 2018-03-26: 1000 mL via INTRAVENOUS

## 2018-03-26 MED ORDER — PANTOPRAZOLE SODIUM 40 MG PO TBEC
40.0000 mg | DELAYED_RELEASE_TABLET | Freq: Every day | ORAL | Status: DC
  Administered 2018-03-26: 40 mg via ORAL
  Filled 2018-03-26: qty 1

## 2018-03-26 MED ORDER — PANCRELIPASE (LIP-PROT-AMYL) 12000-38000 UNITS PO CPEP
36000.0000 [IU] | ORAL_CAPSULE | Freq: Three times a day (TID) | ORAL | Status: DC
  Administered 2018-03-27 – 2018-03-28 (×4): 36000 [IU] via ORAL
  Filled 2018-03-26: qty 1
  Filled 2018-03-26: qty 3
  Filled 2018-03-26 (×2): qty 1
  Filled 2018-03-26 (×3): qty 3

## 2018-03-26 MED ORDER — FAMOTIDINE 20 MG PO TABS
20.0000 mg | ORAL_TABLET | Freq: Every day | ORAL | Status: DC
  Administered 2018-03-26 – 2018-03-28 (×3): 20 mg via ORAL
  Filled 2018-03-26 (×3): qty 1

## 2018-03-26 MED ORDER — FOLIC ACID 1 MG PO TABS
1.0000 mg | ORAL_TABLET | Freq: Every day | ORAL | Status: DC
  Administered 2018-03-27 – 2018-03-29 (×3): 1 mg via ORAL
  Filled 2018-03-26 (×3): qty 1

## 2018-03-26 MED ORDER — PANTOPRAZOLE SODIUM 40 MG PO TBEC
40.0000 mg | DELAYED_RELEASE_TABLET | Freq: Two times a day (BID) | ORAL | Status: DC
  Administered 2018-03-26 – 2018-03-29 (×6): 40 mg via ORAL
  Filled 2018-03-26 (×6): qty 1

## 2018-03-26 MED ORDER — FENTANYL CITRATE (PF) 100 MCG/2ML IJ SOLN
50.0000 ug | INTRAMUSCULAR | Status: DC | PRN
  Administered 2018-03-26 – 2018-03-29 (×25): 50 ug via INTRAVENOUS
  Filled 2018-03-26 (×25): qty 2

## 2018-03-26 MED ORDER — ACETAMINOPHEN 325 MG PO TABS: 650.0000 mg | ORAL_TABLET | Freq: Four times a day (QID) | ORAL | Status: DC | PRN

## 2018-03-26 MED ORDER — MAGNESIUM SULFATE 2 GM/50ML IV SOLN
2.0000 g | Freq: Once | INTRAVENOUS | Status: AC
  Administered 2018-03-26: 2 g via INTRAVENOUS
  Filled 2018-03-26: qty 50

## 2018-03-26 NOTE — ED Notes (Signed)
CRITICAL VALUE ALERT  Critical Value:  K less than 2  Date & Time Notied:  03/26/2018, 1006  Provider Notified: Dr. Regenia Skeeter  Orders Received/Actions taken: See chart

## 2018-03-26 NOTE — ED Provider Notes (Signed)
[facility] EMERGENCY DEPARTMENT Provider Note   CSN: [id number] Arrival date & time: 03/26/18  0938     History   Chief Complaint Chief Complaint  Patient presents with  . Abdominal Pain    HPI David Miranda is a 32 y.o. male.  HPI  32 year old male with a history of pancreatitis due to alcohol abuse presents with vomiting and abdominal pain.  Patient states that since he has been released from the hospital a little less than a month ago he is been having on and off vomiting.  However over the last 2 days it is much more consistent, especially this morning.  There is no blood in his emesis.  No fevers.  He is having some generalized abdominal pain that is much less severe and not the same as when he had pancreatitis.  This seems to come after vomiting.  He also has a little bit of chest pain when vomiting.  No diarrhea or constipation.  The patient has had low potassium in the past and so he took some over-the-counter potassium pills.  However much of what he eats and drinks does not stay down.  He is having diffuse cramping and thinks he is dehydrated. No alcohol use in last few weeks.  Past Medical History:  Diagnosis Date  . Pancreatitis     Patient Active Problem List   Diagnosis Date Noted  . Hypokalemia 02/27/2018  . Essential hypertension 02/27/2018  . Tobacco abuse 02/27/2018  . Acute pancreatitis 02/27/2018  . Constipation   . Annular pancreas   . Pancreatitis   . Non-intractable vomiting with nausea   . Hepatic steatosis   . 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 Leukocytosis 12/27/2017  . Abdominal pain, epigastric 12/27/2017  . Acute alcoholic pancreatitis 18/29/9371  . Alcohol abuse 12/26/2017    History reviewed. No pertinent surgical history.      Home Medications    Prior to Admission medications   Medication Sig Start Date End Date Taking? Authorizing Provider  HYDROcodone-acetaminophen (NORCO/VICODIN) 5-325 MG tablet Take 1 tablet by mouth  every 4 (four) hours as needed for moderate pain. 03/02/18 03/02/19  Kathie Dike, MD  lipase/protease/amylase 24000-76000 units CPEP Take 2 capsules (48,000 Units total) by mouth 3 (three) times daily with meals. 03/02/18   Kathie Dike, MD  omeprazole (PRILOSEC) 20 MG capsule Take 1 capsule (20 mg total) by mouth daily. 03/02/18 03/02/19  Kathie Dike, MD    Family History Family History  Problem Relation Age of Onset  . Cancer Other     Social History Social History   Tobacco Use  . Smoking status: Current Every Day Smoker    Packs/day: 1.00    Types: Cigarettes  . Smokeless tobacco: Never Used  Substance Use Topics  . Alcohol use: Yes    Comment: last use 4-6 weeks ago  . Drug use: Not Currently    Types: Marijuana    Comment: occasionally     Allergies   Patient has no known allergies.   Review of Systems Review of Systems  Constitutional: Negative for fever.  Cardiovascular: Positive for chest pain (when vomiting).  Gastrointestinal: Positive for abdominal pain, nausea and vomiting. Negative for constipation and diarrhea.  Musculoskeletal: Positive for myalgias.  All other systems reviewed and are negative.    Physical Exam Updated Vital Signs BP (!) 130/95   Pulse 88   Temp 97.7 F (36.5 C) (Oral)   Resp 14   Ht 6' (1.829 m)   Wt 74.8 kg  SpO2 100%   BMI 22.38 kg/m   Physical Exam  Constitutional: He appears well-developed and well-nourished.  Non-toxic appearance. He does not appear ill.  HENT:  Head: Normocephalic and atraumatic.  Right Ear: External ear normal.  Left Ear: External ear normal.  Nose: Nose normal.  Eyes: Right eye exhibits no discharge. Left eye exhibits no discharge.  Neck: Neck supple.  Cardiovascular: Regular rhythm and normal heart sounds. Tachycardia present.  Pulmonary/Chest: Effort normal and breath sounds normal. He exhibits no tenderness.  Abdominal: Soft. There is generalized tenderness. There is no rigidity and  no guarding.  Musculoskeletal: He exhibits no edema.  Neurological: He is alert.  Skin: Skin is warm and dry.  Psychiatric: His mood appears not anxious.  Nursing note and vitals reviewed.    ED Treatments / Results  Labs (all labs ordered are listed, but only abnormal results are displayed) Labs Reviewed  LIPASE, BLOOD - Abnormal; Notable for the following components:      Result Value   Lipase 341 (*)    All other components within normal limits  COMPREHENSIVE METABOLIC PANEL - Abnormal; Notable for the following components:   Sodium 132 (*)    Potassium <2.0 (*)    Chloride 65 (*)    CO2 37 (*)    Glucose, Bld 210 (*)    BUN 46 (*)    Creatinine, Ser 3.42 (*)    Total Protein 10.1 (*)    Albumin 5.3 (*)    AST 46 (*)    Total Bilirubin 1.5 (*)    GFR calc non Af Amer 22 (*)    GFR calc Af Amer 26 (*)    All other components within normal limits  CBC WITH DIFFERENTIAL/PLATELET - Abnormal; Notable for the following components:   WBC 12.2 (*)    RBC 6.64 (*)    Hemoglobin 18.0 (*)    HCT 52.8 (*)    MCV 79.5 (*)    RDW 15.7 (*)    Neutro Abs 8.9 (*)    Monocytes Absolute 1.1 (*)    All other components within normal limits  MAGNESIUM - Abnormal; Notable for the following components:   Magnesium 1.6 (*)    All other components within normal limits  URINALYSIS, ROUTINE W REFLEX MICROSCOPIC    EKG EKG Interpretation  Date/Time:  Tuesday March 26 2018 09:24:26 EDT Ventricular Rate:  80 PR Interval:    QRS Duration: 97 QT Interval:  501 QTC Calculation: 579 R Axis:   81 Text Interpretation:  Sinus rhythm Prolonged QT interval Baseline wander in lead(s) V1 V2 similar to Sept 25 2019 Confirmed by Sherwood Gambler [phone]) on 03/26/2018 9:59:47 AM   Radiology Dg Abd Acute W/chest  Result Date: 03/26/2018 CLINICAL DATA:  Nausea, vomiting. EXAM: DG ABDOMEN ACUTE W/ 1V CHEST COMPARISON:  Radiograph of December 31, 2017. FINDINGS: There is no evidence of dilated bowel  loops or free intraperitoneal air. Phlebolith is noted in the pelvis. Heart size and mediastinal contours are within normal limits. Both lungs are clear. IMPRESSION: No evidence of bowel obstruction or ileus. No acute cardiopulmonary disease. Electronically Signed   By: Marijo Conception, M.D.   On: 03/26/2018 10:19    Procedures .Critical Care Performed by: Sherwood Gambler, MD Authorized by: Sherwood Gambler, MD   Critical care provider statement:    Critical care time (minutes):  35   Critical care time was exclusive of:  Separately billable procedures and treating other patients   Critical care  was necessary to treat or prevent imminent or life-threatening deterioration of the following conditions:  Renal failure and metabolic crisis   Critical care was time spent personally by me on the following activities:  Development of treatment plan with patient or surrogate, discussions with consultants, evaluation of patient's response to treatment, examination of patient, obtaining history from patient or surrogate, ordering and performing treatments and interventions, ordering and review of laboratory studies, ordering and review of radiographic studies, pulse oximetry, re-evaluation of patient's condition and review of old charts   (including critical care time)  Medications Ordered in ED Medications  magnesium sulfate IVPB 2 g 50 mL (2 g Intravenous New Bag/Given 03/26/18 1037)  potassium chloride 10 mEq in 100 mL IVPB (10 mEq Intravenous New Bag/Given 03/26/18 1036)  sodium chloride 0.9 % bolus 1,000 mL (1,000 mLs Intravenous New Bag/Given 03/26/18 1042)  lactated ringers bolus 1,000 mL (0 mLs Intravenous Stopped 03/26/18 1033)  HYDROmorphone (DILAUDID) injection 1 mg (1 mg Intravenous Given 03/26/18 0931)  metoCLOPramide (REGLAN) injection 10 mg (10 mg Intravenous Given 03/26/18 0931)  potassium chloride SA (K-DUR,KLOR-CON) CR tablet 40 mEq (40 mEq Oral Given 03/26/18 1037)     Initial  Impression / Assessment and Plan / ED Course  I have reviewed the triage vital signs and the nursing notes.  Pertinent labs & imaging results that were available during my care of the patient were reviewed by me and considered in my medical decision making (see chart for details).     Patient's lab work is significantly abnormal, including acute kidney injury with a creatinine of 3.4, severe hypokalemia with potassium less than 2 and hypomagnesemia.  His abdominal exam is relatively benign with mild diffuse tenderness.  No rebound or guarding.  This is much less severe than his typical pancreatitis and was lipase is elevated, I do not think an emergent CT is needed as my suspicion for acute necrosis or abscess/pseudocyst is low.  However he will need fluids and replete meant of his electrolytes.  He does have a prolonged QT in the 500s.  This will need to be monitored.  He has been given IV and oral replacement of potassium and magnesium.  Dr. Dyann Kief to admit.  Final Clinical Impressions(s) / ED Diagnoses   Final diagnoses:  Acute kidney injury ([place])  Hypokalemia  Hypomagnesemia    ED Discharge Orders    None       Sherwood Gambler, MD 03/26/18 1049

## 2018-03-26 NOTE — H&P (Signed)
History and Physical    David Miranda XKG:[id number] DOB: [birth year] DOA: 03/26/2018  Referring MD/NP/PA: Dr. Regenia Skeeter  PCP: Patient, No Pcp Per  Patient coming from: home  Chief Complaint: nausea, vomiting and abd pain  HPI: David Miranda is a 32 y.o. male with a past medical history significant for tobacco abuse, GERD, history of alcohol abuse and prior pancreatitis associated with alcohol; who presented to the hospital secondary to intractable nausea, vomiting and abdominal pain.  Patient reported that since his last discharge he was intermittently experiencing problem with vomiting and abdominal discomfort.  But he was able to keep himself hydrated.  He was never able to use cream due to cost.  And has no follow-up with any PCP.  For the last 48 hours prior to admission he had difficulty keeping anything down and go himself entire, with ongoing abdominal pain and just not feeling good.  Due to concern of being dehydrated patient decided to come to the emergency department for further evaluation.  Patient reports abdominal pain to be in his mid epigastric area, not radiated, worse with food, with associated nausea and vomiting. Pain was 5/-7/10 at worst. He denies any fevers, no chest pain, no shortness of breath, no dysuria, no hematuria, no melena, no hematochezia.  He reports that his last drink was approximately about 3 weeks ago.  In the ED patient was found with acute renal failure creatinine 3.42 (which was normal at the time of his last discharge), lipase in the 340s range, severe hypokalemia with a potassium less than 2 and hypomagnesemia.  Patient was not actively vomiting in the ED and he was started on IV fluids, potassium supplementation, received a dose of magnesium and [facility] was consulted to admit him for further evaluation and management.  Past Medical/Surgical History: Past Medical History:  Diagnosis Date  . Pancreatitis    No prior surgical history.  Social History:  reports  that he has been smoking cigarettes. He has been smoking about 1.00 pack per day. He has never used smokeless tobacco. He reports that he drinks alcohol. He reports that he has current or past drug history. Drug: Marijuana.  Allergies: No Known Allergies  Family History:  Family History  Problem Relation Age of Onset  . Cancer Other     Prior to Admission medications   Medication Sig Start Date End Date Taking? Authorizing Provider  omeprazole (PRILOSEC) 20 MG capsule Take 1 capsule (20 mg total) by mouth daily. 03/02/18 03/02/19 Yes Kathie Dike, MD  Potassium 99 MG TABS Take 1 tablet by mouth daily.   Yes [provider]  HYDROcodone-acetaminophen (NORCO/VICODIN) 5-325 MG tablet Take 1 tablet by mouth every 4 (four) hours as needed for moderate pain. Patient not taking: Reported on 03/26/2018 03/02/18 03/02/19  Kathie Dike, MD  lipase/protease/amylase 24000-76000 units CPEP Take 2 capsules (48,000 Units total) by mouth 3 (three) times daily with meals. 03/02/18   Kathie Dike, MD    Review of Systems:  Negative except as otherwise mentioned on HPI.   Physical Exam: Vitals:   03/26/18 1000 03/26/18 1030 03/26/18 1130 03/26/18 1500  BP: 132/89 (!) 124/94 (!) 138/106 (!) 134/97  Pulse: 72 69 68 71  Resp: 17 15 12 12   Temp:      TempSrc:      SpO2: 99% 94% 98% 98%  Weight:      Height:       Constitutional: NAD, calm, comfortable Eyes: PERRL, lids and conjunctivae normal; no icterus. ENMT: Mucous membranes  are moist. Posterior pharynx clear of any exudate or lesions.Normal dentition.  Neck: normal, supple, no masses, no thyromegaly Respiratory: clear to auscultation bilaterally, no wheezing, no crackles. Normal respiratory effort. No accessory muscle use.  Cardiovascular: Regular rate and rhythm, no murmurs / rubs / gallops. No extremity edema. 2+ pedal pulses. No carotid bruits.  Abdomen: Tender to palpation epigastric and/mid abdomen area, no guarding, positive  bowel sounds, soft, no masses. Musculoskeletal: no clubbing / cyanosis. No joint deformity upper and lower extremities. Good ROM, no contractures. Normal muscle tone.  Skin: no rashes, lesions, ulcers. No induration Neurologic: CN 2-12 grossly intact. Sensation intact, DTR normal. Strength 5/5 in all 4.  Psychiatric: Normal judgment and insight. Alert and oriented x 3. Normal mood.    Labs on Admission: I have personally reviewed the following labs and imaging studies  CBC: Recent Labs  Lab 03/26/18 0934  WBC 12.2*  NEUTROABS 8.9*  HGB 18.0*  HCT 52.8*  MCV 79.5*  PLT 295   Basic Metabolic Panel: Recent Labs  Lab 03/26/18 0934  NA 132*  K <2.0*  CL 65*  CO2 37*  GLUCOSE 210*  BUN 46*  CREATININE 3.42*  CALCIUM 10.1  MG 1.6*   GFR: Estimated Creatinine Clearance: 33.1 mL/min (A) (by C-G formula based on SCr of 3.42 mg/dL (H)).   Liver Function Tests: Recent Labs  Lab 03/26/18 0934  AST 46*  ALT 34  ALKPHOS 95  BILITOT 1.5*  PROT 10.1*  ALBUMIN 5.3*   Recent Labs  Lab 03/26/18 0934  LIPASE 341*   Urine analysis:    Component Value Date/Time   COLORURINE AMBER (A) 03/26/2018 0934   APPEARANCEUR CLOUDY (A) 03/26/2018 0934   LABSPEC 1.021 03/26/2018 0934   PHURINE 5.0 03/26/2018 [place] 03/26/2018 0934   HGBUR NEGATIVE 03/26/2018 0934   BILIRUBINUR NEGATIVE 03/26/2018 0934   KETONESUR 5 (A) 03/26/2018 0934   PROTEINUR 100 (A) 03/26/2018 0934   NITRITE NEGATIVE 03/26/2018 0934   LEUKOCYTESUR NEGATIVE 03/26/2018 0934   Radiological Exams on Admission: Dg Abd Acute W/chest  Result Date: 03/26/2018 CLINICAL DATA:  Nausea, vomiting. EXAM: DG ABDOMEN ACUTE W/ 1V CHEST COMPARISON:  Radiograph of December 31, 2017. FINDINGS: There is no evidence of dilated bowel loops or free intraperitoneal air. Phlebolith is noted in the pelvis. Heart size and mediastinal contours are within normal limits. Both lungs are clear. IMPRESSION: No evidence of bowel  obstruction or ileus. No acute cardiopulmonary disease. Electronically Signed   By: Marijo Conception, M.D.   On: 03/26/2018 10:19    EKG: None  Assessment/Plan 1-acute on chronic pancreatitis (HCC) -Allow clear liquid diet as per patient request -Provide pain medications and antiemetics as needed -follow lipase -aggressive fluid resuscitation -if he develops fever. Will recommend CT scan of his abdomen to rule out abscess/pseudocyst. -follow clinical response -Resume the use of Creon while inpatient  2-Alcohol abuse -currently in remission -cessation counseling provided -will use thiamine and folic acid -has never experience withdrawal before -will monitored in telemetry anyway  3-severe hypokalemia: due to dehydration, poor oral intake and GI losses -Magnesium has been repleted -Patient receive 3 rounds of IV potassium; 3 doses of 40 mEq by mouth and has been started on IV fluid with 40 mEq of potassium inside. -Follow electrolytes trend.  4-Essential hypertension -Blood pressure is fairly stable -He was no using any antihypertensive medication as an outpatient -Heart healthy diet has been encouraged. -Pain may be causing part of the elevation  in his blood pressure and will focus on pain control.  5-Tobacco abuse -I have discussed tobacco cessation with the patient.  I have counseled the patient regarding the negative impacts of continued tobacco use including but not limited to lung cancer, COPD, and cardiovascular disease.  I have discussed alternatives to tobacco and modalities that may help facilitate tobacco cessation including but not limited to biofeedback, hypnosis, and medications.  Total time spent with tobacco counseling was 5 min. -nicotine patch ordered  6-hx of Marijuana use -UDS ordered   DVT prophylaxis: Heparin Code Status: Full code Family Communication: No family at bedside. Disposition Plan: Anticipate discharge back home once medically stable, able to  tolerate p.o.'s with improvement/resolution of his renal failure. Consults called: None Admission status: Inpatient, length of stay more than 2 midnights, telemetry bed.   Time Spent: 65 minutes  Barton Dubois MD [facility] Pager [phone]  If 7PM-7AM, please contact night-coverage [email or web address] Password [email or web address]  03/26/2018, 3:10 PM

## 2018-03-26 NOTE — ED Triage Notes (Signed)
Pt c/o abdominal pain with n/v since last night. Hx of pancreatitis. No alcohol use x 4-6 weeks.

## 2018-03-26 NOTE — ED Notes (Signed)
ED Provider at bedside. 

## 2018-03-27 DIAGNOSIS — F1210 Cannabis abuse, uncomplicated: Secondary | ICD-10-CM

## 2018-03-27 DIAGNOSIS — N179 Acute kidney failure, unspecified: Secondary | ICD-10-CM

## 2018-03-27 LAB — BASIC METABOLIC PANEL
Anion gap: 9 (ref 5–15)
BUN: 30 mg/dL — ABNORMAL HIGH (ref 6–20)
CO2: 36 mmol/L — ABNORMAL HIGH (ref 22–32)
Calcium: 8.6 mg/dL — ABNORMAL LOW (ref 8.9–10.3)
Chloride: 88 mmol/L — ABNORMAL LOW (ref 98–111)
Creatinine, Ser: 1.33 mg/dL — ABNORMAL HIGH (ref 0.61–1.24)
GFR calc Af Amer: >60 mL/min (ref >60)
GFR calc non Af Amer: >60 mL/min (ref >60)
Glucose, Bld: 118 mg/dL — ABNORMAL HIGH (ref 70–99)
Potassium: 2.5 mmol/L — CL (ref 3.5–5.1)
Sodium: 133 mmol/L — ABNORMAL LOW (ref 135–145)

## 2018-03-27 LAB — MAGNESIUM: Magnesium: 2.4 mg/dL (ref 1.7–2.4)

## 2018-03-27 LAB — RAPID URINE DRUG SCREEN, HOSP PERFORMED
Amphetamines: NOT DETECTED
Barbiturates: NOT DETECTED
Benzodiazepines: NOT DETECTED
Cocaine: NOT DETECTED
Opiates: POSITIVE — AB
Tetrahydrocannabinol: POSITIVE — AB

## 2018-03-27 LAB — CBC
HCT: 40.2 % (ref 39.0–52.0)
Hemoglobin: 13.4 g/dL (ref 13.0–17.0)
MCH: 27.2 pg (ref 26.0–34.0)
MCHC: 33.3 g/dL (ref 30.0–36.0)
MCV: 81.7 fL (ref 80.0–100.0)
Platelets: 241 10*3/uL (ref 150–400)
RBC: 4.92 MIL/uL (ref 4.22–5.81)
RDW: 14.5 % (ref 11.5–15.5)
WBC: 9.2 10*3/uL (ref 4.0–10.5)
nRBC: 0 % (ref 0.0–0.2)

## 2018-03-27 LAB — LIPASE, BLOOD: Lipase: 289 U/L — ABNORMAL HIGH (ref 11–51)

## 2018-03-27 MED ORDER — POTASSIUM CHLORIDE CRYS ER 20 MEQ PO TBCR
40.0000 meq | EXTENDED_RELEASE_TABLET | Freq: Once | ORAL | Status: AC
  Administered 2018-03-27: 40 meq via ORAL
  Filled 2018-03-27: qty 2

## 2018-03-27 MED ORDER — POTASSIUM CHLORIDE 10 MEQ/100ML IV SOLN
10.0000 meq | INTRAVENOUS | Status: AC
  Administered 2018-03-27 (×4): 10 meq via INTRAVENOUS
  Filled 2018-03-27 (×4): qty 100

## 2018-03-27 MED ORDER — SODIUM CHLORIDE 0.9 % IV SOLN: INTRAVENOUS | Status: DC | PRN

## 2018-03-27 MED ORDER — SIMETHICONE 80 MG PO CHEW
80.0000 mg | CHEWABLE_TABLET | Freq: Four times a day (QID) | ORAL | Status: AC
  Administered 2018-03-27 (×4): 80 mg via ORAL
  Filled 2018-03-27 (×4): qty 1

## 2018-03-27 NOTE — Progress Notes (Signed)
PROGRESS NOTE  David Miranda SNK:[id number] DOB: 02/11/1986 DOA: 03/26/2018 PCP: Patient, No Pcp Per  Brief History:  32 year old male with a history of chronic pancreatitis and alcohol abuse presenting with 2-day history of nausea, vomiting, and epigastric abdominal pain that began on 03/25/2018.  The patient was recently admitted to the hospital from 12/26/2017 through 01/03/2018 for acute alcoholic pancreatitis.  He was also treated for pneumonia at that time, and he was discharged with instructions to follow-up with gastroenterology in 6 weeks.  Unfortunately, he has not followed up.  He states that he has not taken any of his pancreatic enzyme replacement because of cost.  He denies any new medications or over-the-counter medications, but states that he has been taking 3 to 4 pills of NSAIDs over-the-counter for his epigastric abdominal pain.  He denies any alcohol use since his last admission, but then later states that he has not drank in 3 weeks.  He denies any illegal drug use.  He denies any fevers, chills, chest pain, shortness breath, diarrhea, hematochezia, melena, hematemesis.  Upon presentation, WBC was 12.2 with lipase 341.  UDS showed THC and opiates.  GI was consulted to assist with management.  Assessment/Plan: Acute on chronic pancreatitis -Likely related to alcohol -Patient is not particularly forthcoming regarding his alcohol use--he gives different answers at different times--"Aint really had nothing since last time" -GI consult -The patient had 3 separate CTs of his abdomen during his last admission.  Most recent CT 02/27/2018 shows changes of acute pancreatitis superimposed upon chronic changes. -Continue clear liquid diet -Continue IV fluids -Judicious opioids -noncompliant with pancreatic enzyme replacement -continue PPI -Certainly, his emesis could be attributed to cannabis hyperemesis syndrome  Alcohol abuse --Patient is not particularly forthcoming regarding  his alcohol use--he gives different answers at different times--"Aint really had nothing since last time" -alcohol withdrawal protocol  AKI -due to volume depletion -Baseline creatinine 0.6-0.9 -Serum creatinine peaked at 3.42 -Continue IV fluids  Hypokalemia/hypomagnesemia -Secondary to GI loss -Repleted  Tobacco abuse -I have discussed tobacco cessation with the patient.  I have counseled the patient regarding the negative impacts of continued tobacco use including but not limited to lung cancer, COPD, and cardiovascular disease.  I have discussed alternatives to tobacco and modalities that may help facilitate tobacco cessation including but not limited to biofeedback, hypnosis, and medications.  Total time spent with tobacco counseling was 4 minutes. -nicoderm patch       Disposition Plan:   Home in 2-3 days  Family Communication:  No Family at bedside  Consultants:  GI  Code Status:  FULL  DVT Prophylaxis:  [place] Heparin    Procedures: As Listed in Progress Note Above  Antibiotics: None    Subjective: Patient complains of epigastric abdominal pain and nausea.  He states it is little better than yesterday.  His vomiting has improved.  He denies any fevers, chills, chest pain or shortness breath, cough, hemoptysis, hematemesis, hematochezia, melena, dysuria, hematuria.  Objective: Vitals:   03/26/18 1930 03/26/18 2019 03/27/18 0608 03/27/18 0814  BP: (!) 130/95 132/80 131/89   Pulse: (!) 59 61 61   Resp: 18 17 18    Temp:  98.6 F (37 C) 98 F (36.7 C)   TempSrc:  Oral Oral   SpO2: 95% 97% 96% 94%  Weight:  75.8 kg    Height:  6' (1.829 m)      Intake/Output Summary (Last 24 hours) at 03/27/2018 0827 Last  data filed at 03/27/2018 0827 Gross per 24 hour  Intake 4788.41 ml  Output -  Net 4788.41 ml   Weight change:  Exam:   General:  Pt is alert, follows commands appropriately, not in acute distress  HEENT: No icterus, No thrush, No neck mass,  [place]/AT  Cardiovascular: RRR, S1/S2, no rubs, no gallops  Respiratory: CTA bilaterally, no wheezing, no crackles, no rhonchi  Abdomen: Soft/+BS, epigastric tender, non distended, no guarding  Extremities: No edema, No lymphangitis, No petechiae, No rashes, no synovitis   Data Reviewed: I have personally reviewed following labs and imaging studies Basic Metabolic Panel: Recent Labs  Lab 03/26/18 0934 03/27/18 0522  NA 132* 133*  K <2.0* 2.5*  CL 65* 88*  CO2 37* 36*  GLUCOSE 210* 118*  BUN 46* 30*  CREATININE 3.42* 1.33*  CALCIUM 10.1 8.6*  MG 1.6* 2.4  PHOS 5.1*  --    Liver Function Tests: Recent Labs  Lab 03/26/18 0934  AST 46*  ALT 34  ALKPHOS 95  BILITOT 1.5*  PROT 10.1*  ALBUMIN 5.3*   Recent Labs  Lab 03/26/18 0934 03/27/18 0522  LIPASE 341* 289*   No results for input(s): AMMONIA in the last 168 hours. Coagulation Profile: No results for input(s): INR, PROTIME in the last 168 hours. CBC: Recent Labs  Lab 03/26/18 0934 03/27/18 0522  WBC 12.2* 9.2  NEUTROABS 8.9*  --   HGB 18.0* 13.4  HCT 52.8* 40.2  MCV 79.5* 81.7  PLT 350 241   Cardiac Enzymes: No results for input(s): CKTOTAL, CKMB, CKMBINDEX, TROPONINI in the last 168 hours. BNP: Invalid input(s): POCBNP CBG: No results for input(s): GLUCAP in the last 168 hours. HbA1C: No results for input(s): HGBA1C in the last 72 hours. Urine analysis:    Component Value Date/Time   COLORURINE AMBER (A) 03/26/2018 0934   APPEARANCEUR CLOUDY (A) 03/26/2018 0934   LABSPEC 1.021 03/26/2018 0934   PHURINE 5.0 03/26/2018 0934   GLUCOSEU NEGATIVE 03/26/2018 0934   HGBUR NEGATIVE 03/26/2018 0934   BILIRUBINUR NEGATIVE 03/26/2018 0934   KETONESUR 5 (A) 03/26/2018 0934   PROTEINUR 100 (A) 03/26/2018 0934   NITRITE NEGATIVE 03/26/2018 0934   LEUKOCYTESUR NEGATIVE 03/26/2018 0934   Sepsis Labs: @LABRCNTIP (procalcitonin:4,lacticidven:4) )No results found for this or any previous visit (from the past  240 hour(s)).   Scheduled Meds: . famotidine  20 mg Oral QHS  . folic acid  1 mg Oral Daily  . heparin injection (subcutaneous)  5,000 Units Subcutaneous Q8H  . lipase/protease/amylase  36,000 Units Oral TID WC  . nicotine  21 mg Transdermal Daily  . pantoprazole  40 mg Oral BID  . simethicone  80 mg Oral QID  . thiamine  100 mg Oral Daily   Continuous Infusions: . sodium chloride    . 0.9 % NaCl with KCl 40 mEq / L 100 mL/hr at 03/27/18 0827  . potassium chloride 10 mEq (03/27/18 0826)    Procedures/Studies: Ct Abdomen Pelvis W Contrast  Result Date: 02/27/2018 CLINICAL DATA:  Upper abdominal pain for 2 days, history of pancreatitis EXAM: CT ABDOMEN AND PELVIS WITH CONTRAST TECHNIQUE: Multidetector CT imaging of the abdomen and pelvis was performed using the standard protocol following bolus administration of intravenous contrast. CONTRAST:  178mL ISOVUE-300 IOPAMIDOL (ISOVUE-300) INJECTION 61% COMPARISON:  01/03/2018 FINDINGS: Lower chest: No acute abnormality. Hepatobiliary: The gallbladder is again well visualized and within normal limits. The liver again demonstrates geographic areas of decreased attenuation consistent with fatty infiltration. Pancreas: Persistent  changes surrounding the pancreas consistent with pancreatitis. No definitive pancreatic necrosis is seen. The collection noted along the midbody is less prominent now measuring 2.9 x 1.5 cm. It previously measured 3.7 x 2.7 cm. The collection surrounding the celiac axis is not well appreciated consistent with resolution. Local inflammatory changes are noted surrounding the head with some associated involvement of the adjacent duodenum. The fluid collection adjacent to the tail has also decreased in size significantly. Spleen: Normal in size without focal abnormality. Adrenals/Urinary Tract: Adrenal glands are within normal limits. The kidneys are well visualized bilaterally. No renal calculi or obstructive changes are seen. Left  renal cyst is noted and stable. The bladder is decompressed. Stomach/Bowel: No obstructive or inflammatory changes of the larger small-bowel are seen with the exception of the changes surrounding the first and second portion of the duodenum. The appendix is within normal limits. The stomach is unremarkable. Vascular/Lymphatic: No significant vascular findings are present. No enlarged abdominal or pelvic lymph nodes. Reproductive: Prostate is unremarkable. Other: No abdominal wall hernia or abnormality. No abdominopelvic ascites. Musculoskeletal: No acute or significant osseous findings. IMPRESSION: Changes of acute pancreatitis superimposed on more chronic appearing changes (fluid collections) which are resolving when compared with the prior exam. The acute changes involve primarily the head and uncinate process. Some localized inflammatory change of the adjacent duodenum is noted as well. Stable geographic hepatic steatosis. Electronically Signed   By: Inez Catalina M.D.   On: 02/27/2018 16:06   Dg Abd Acute W/chest  Result Date: 03/26/2018 CLINICAL DATA:  Nausea, vomiting. EXAM: DG ABDOMEN ACUTE W/ 1V CHEST COMPARISON:  Radiograph of December 31, 2017. FINDINGS: There is no evidence of dilated bowel loops or free intraperitoneal air. Phlebolith is noted in the pelvis. Heart size and mediastinal contours are within normal limits. Both lungs are clear. IMPRESSION: No evidence of bowel obstruction or ileus. No acute cardiopulmonary disease. Electronically Signed   By: Marijo Conception, M.D.   On: 03/26/2018 10:19    Orson Eva, DO  [facility] Pager [phone]  If 7PM-7AM, please contact night-coverage [email or web address] Password [email or web address] 03/27/2018, 8:27 AM   LOS: 1 day

## 2018-03-27 NOTE — Consult Note (Signed)
Referring Provider: Orson Eva, MD Primary Care Physician:  Patient, No Pcp Per Primary Gastroenterologist:  Barney Drain, MD  Reason for Consultation:  pancreatitis  HPI: David Miranda is a 32 y.o. male with history of pancreatitis in the setting of alcohol abuse, admitted back in July and September of this year.  In July, CT evidence concerning for possible annular pancreas, possible necrosis and fluid collection suspicious for developing pseudocyst back in July.  CT last month showed persistent changes surrounding the pancreas consistent with pancreatitis.  No definite necrosis.  Collection of fluid along the mid body less prominent.  Collection surrounding celiac axis is not well appreciated consistent with resolution.  Focal inflammatory changes noted surrounding the head with some associated involvement of the adjacent duodenum.  Fluid collection adjacent to the tail decreased in size significantly.  He has had a total of 4 CTs since July, last one in September.  Patient states he never really felt 100% better.  He has had intermittent nausea and vomiting since discharge several weeks ago.  Appetite never really returned.  Over the past several days he has had more persistent nausea with vomiting, no hematemesis, abdominal cramping.  States his upper abdominal pain really started after he got to the hospital.  Bowel movements have been regular.  No blood in the stool or melena.  Unable to obtain Creon as he is uninsured.  States he could not find our office to pick up samples.  In the ED his lipase was 341, potassium less than 2, sodium 132, chloride 65, CO2 37, glucose 210, creatinine 3.42 (up from 0.65 three weeks ago), calcium 10.1, AST 46, ALT 34, alkaline phosphatase 95, total bilirubin 1.5, albumin 5.3.  White blood cell count 12,200, hemoglobin 18 platelets 350,000, magnesium 1.6.  Today's white count is down to 9200, hemoglobin 13.4, magnesium 2.4, potassium 2.5, creatinine 1.33.  Last  alcohol use about 3 weeks ago??    Prior to Admission medications   Medication Sig Start Date End Date Taking? Authorizing Provider  omeprazole (PRILOSEC) 20 MG capsule Take 1 capsule (20 mg total) by mouth daily. 03/02/18 03/02/19 Yes Kathie Dike, MD  Potassium 99 MG TABS Take 1 tablet by mouth daily.   Yes [provider]  HYDROcodone-acetaminophen (NORCO/VICODIN) 5-325 MG tablet Take 1 tablet by mouth every 4 (four) hours as needed for moderate pain. Patient not taking: Reported on 03/26/2018 03/02/18 03/02/19  Kathie Dike, MD            Current Facility-Administered Medications  Medication Dose Route Frequency Provider Last Rate Last Dose  . 0.9 %  sodium chloride infusion   Intravenous PRN Tat, David, MD      . 0.9 % NaCl with KCl 40 mEq / L  infusion   Intravenous Continuous Barton Dubois, MD 100 mL/hr at 03/27/18 1306    . acetaminophen (TYLENOL) tablet 650 mg  650 mg Oral Q6H PRN Barton Dubois, MD      . alum & mag hydroxide-simeth (MAALOX/MYLANTA) 200-200-20 MG/5ML suspension 30 mL  30 mL Oral Q4H PRN Barton Dubois, MD   30 mL at 03/27/18 1310  . famotidine (PEPCID) tablet 20 mg  20 mg Oral QHS Barton Dubois, MD   20 mg at 03/26/18 2123  . fentaNYL (SUBLIMAZE) injection 50 mcg  50 mcg Intravenous Q2H PRN Schorr, Rhetta Mura, NP   50 mcg at 03/27/18 1156  . folic acid (FOLVITE) tablet 1 mg  1 mg Oral Daily Barton Dubois, MD   1  mg at 03/27/18 0808  . heparin injection 5,000 Units  5,000 Units Subcutaneous Q8H Barton Dubois, MD   5,000 Units at 03/26/18 1542  . lipase/protease/amylase (CREON) capsule 36,000 Units  36,000 Units Oral TID WC Barton Dubois, MD   36,000 Units at 03/27/18 1157  . nicotine (NICODERM CQ - dosed in mg/24 hours) patch 21 mg  21 mg Transdermal Daily Barton Dubois, MD   21 mg at 03/27/18 0810  . ondansetron (ZOFRAN) tablet 4 mg  4 mg Oral Q6H PRN Barton Dubois, MD       Or  . ondansetron [facility]) injection 4 mg  4 mg Intravenous Q6H PRN  Barton Dubois, MD   4 mg at 03/26/18 1543  . pantoprazole (PROTONIX) EC tablet 40 mg  40 mg Oral BID Barton Dubois, MD   40 mg at 03/27/18 1829  . potassium chloride 10 mEq in 100 mL IVPB  10 mEq Intravenous Q1 Hr x 4 Tat, David, MD 100 mL/hr at 03/27/18 1305 10 mEq at 03/27/18 1305  . simethicone (MYLICON) chewable tablet 80 mg  80 mg Oral QID Oswald Hillock, MD   80 mg at 03/27/18 1305  . thiamine (VITAMIN B-1) tablet 100 mg  100 mg Oral Daily Barton Dubois, MD   100 mg at 03/27/18 0809    Allergies as of 03/26/2018  . (No Known Allergies)    Past Medical History:  Diagnosis Date  . Pancreatitis     History reviewed. No pertinent surgical history.  Family History  Problem Relation Age of Onset  . Cancer Other     Social History   Socioeconomic History  . Marital status: Single    Spouse name: Not on file  . Number of children: Not on file  . Years of education: Not on file  . Highest education level: Not on file  Occupational History  . Not on file  Social Needs  . Financial resource strain: Patient refused  . Food insecurity:    Worry: Patient refused    Inability: Patient refused  . Transportation needs:    Medical: Patient refused    Non-medical: Patient refused  Tobacco Use  . Smoking status: Current Every Day Smoker    Packs/day: 1.00    Types: Cigarettes  . Smokeless tobacco: Never Used  Substance and Sexual Activity  . Alcohol use: Yes    Comment: last use 4-6 weeks ago  . Drug use: Not Currently    Types: Marijuana    Comment: occasionally  . Sexual activity: Not on file  Lifestyle  . Physical activity:    Days per week: Patient refused    Minutes per session: Patient refused  . Stress: Patient refused  Relationships  . Social connections:    Talks on phone: Patient refused    Gets together: Patient refused    Attends religious service: Patient refused    Active member of club or organization: Patient refused    Attends meetings of clubs or  organizations: Patient refused    Relationship status: Patient refused  . Intimate partner violence:    Fear of current or ex partner: Patient refused    Emotionally abused: Patient refused    Physically abused: Patient refused    Forced sexual activity: Patient refused  Other Topics Concern  . Not on file  Social History Narrative  . Not on file     ROS:  General: Negative for anorexia, weight loss, fever, chills, fatigue, positive weakness. Eyes: Negative for vision  changes.  ENT: Negative for hoarseness, difficulty swallowing , nasal congestion. CV: Negative for chest pain, angina, palpitations, dyspnea on exertion, peripheral edema.  Respiratory: Negative for dyspnea at rest, dyspnea on exertion, cough, sputum, wheezing.  GI: See history of present illness. GU:  Negative for dysuria, hematuria, urinary incontinence, urinary frequency, nocturnal urination.  MS: Negative for joint pain, low back pain.  Derm: Negative for rash or itching.  Neuro: Negative for weakness, abnormal sensation, seizure, frequent headaches, memory loss, confusion.  Psych: Negative for anxiety, depression, suicidal ideation, hallucinations.  Endo: Negative for unusual weight change.  Heme: Negative for bruising or bleeding. Allergy: Negative for rash or hives.       Physical Examination: Vital signs in last 24 hours: Temp:  [98 F (36.7 C)-98.6 F (37 C)] 98.5 F (36.9 C) (10/23 1317) Pulse Rate:  [59-77] 61 (10/23 1317) Resp:  [12-20] 18 (10/23 1317) BP: (130-148)/(80-99) 141/96 (10/23 1317) SpO2:  [94 %-100 %] 98 % (10/23 1317) Weight:  [75.8 kg] 75.8 kg (10/22 2019) Last BM Date: 03/27/18  General: Well-nourished, well-developed in no acute distress.  Head: Normocephalic, atraumatic.   Eyes: Conjunctiva pink, no icterus. Mouth: Oropharyngeal mucosa moist and pink , no lesions erythema or exudate. Neck: Supple without thyromegaly, masses, or lymphadenopathy.  Lungs: Clear to auscultation  bilaterally.  Heart: Regular rate and rhythm, no murmurs rubs or gallops.  Abdomen: Bowel sounds are normal, moderate epigastric tenderness, nondistended, no hepatosplenomegaly or masses, no abdominal bruits or    hernia , no rebound or guarding.   Rectal: Not performed Extremities: No lower extremity edema, clubbing, deformity.  Neuro: Alert and oriented x 4 , grossly normal neurologically.  Skin: Warm and dry, no rash or jaundice.   Psych: Alert and cooperative, normal mood and affect.        Intake/Output from previous day: 10/22 0701 - 10/23 0700 In: 4243.5 [I.V.:1093.5; IV Piggyback:3150] Out: -  Intake/Output this shift: Total I/O In: 1641.8 [P.O.:240; I.V.:1001.8; IV Piggyback:400] Out: -   Lab Results: CBC Recent Labs    03/26/18 0934 03/27/18 0522  WBC 12.2* 9.2  HGB 18.0* 13.4  HCT 52.8* 40.2  MCV 79.5* 81.7  PLT 350 241   BMET Recent Labs    03/26/18 0934 03/27/18 0522  NA 132* 133*  K <2.0* 2.5*  CL 65* 88*  CO2 37* 36*  GLUCOSE 210* 118*  BUN 46* 30*  CREATININE 3.42* 1.33*  CALCIUM 10.1 8.6*   LFT Recent Labs    03/26/18 0934  BILITOT 1.5*  ALKPHOS 95  AST 46*  ALT 34  PROT 10.1*  ALBUMIN 5.3*    Lipase Recent Labs    03/26/18 0934 03/27/18 0522  LIPASE 341* 289*    PT/INR No results for input(s): LABPROT, INR in the last 72 hours.    Imaging Studies: Ct Abdomen Pelvis W Contrast  Result Date: 02/27/2018 CLINICAL DATA:  Upper abdominal pain for 2 days, history of pancreatitis EXAM: CT ABDOMEN AND PELVIS WITH CONTRAST TECHNIQUE: Multidetector CT imaging of the abdomen and pelvis was performed using the standard protocol following bolus administration of intravenous contrast. CONTRAST:  185mL ISOVUE-300 IOPAMIDOL (ISOVUE-300) INJECTION 61% COMPARISON:  01/03/2018 FINDINGS: Lower chest: No acute abnormality. Hepatobiliary: The gallbladder is again well visualized and within normal limits. The liver again demonstrates geographic areas  of decreased attenuation consistent with fatty infiltration. Pancreas: Persistent changes surrounding the pancreas consistent with pancreatitis. No definitive pancreatic necrosis is seen. The collection noted along the midbody is less  prominent now measuring 2.9 x 1.5 cm. It previously measured 3.7 x 2.7 cm. The collection surrounding the celiac axis is not well appreciated consistent with resolution. Local inflammatory changes are noted surrounding the head with some associated involvement of the adjacent duodenum. The fluid collection adjacent to the tail has also decreased in size significantly. Spleen: Normal in size without focal abnormality. Adrenals/Urinary Tract: Adrenal glands are within normal limits. The kidneys are well visualized bilaterally. No renal calculi or obstructive changes are seen. Left renal cyst is noted and stable. The bladder is decompressed. Stomach/Bowel: No obstructive or inflammatory changes of the larger small-bowel are seen with the exception of the changes surrounding the first and second portion of the duodenum. The appendix is within normal limits. The stomach is unremarkable. Vascular/Lymphatic: No significant vascular findings are present. No enlarged abdominal or pelvic lymph nodes. Reproductive: Prostate is unremarkable. Other: No abdominal wall hernia or abnormality. No abdominopelvic ascites. Musculoskeletal: No acute or significant osseous findings. IMPRESSION: Changes of acute pancreatitis superimposed on more chronic appearing changes (fluid collections) which are resolving when compared with the prior exam. The acute changes involve primarily the head and uncinate process. Some localized inflammatory change of the adjacent duodenum is noted as well. Stable geographic hepatic steatosis. Electronically Signed   By: Inez Catalina M.D.   On: 02/27/2018 16:06   Dg Abd Acute W/chest  Result Date: 03/26/2018 CLINICAL DATA:  Nausea, vomiting. EXAM: DG ABDOMEN ACUTE W/ 1V  CHEST COMPARISON:  Radiograph of December 31, 2017. FINDINGS: There is no evidence of dilated bowel loops or free intraperitoneal air. Phlebolith is noted in the pelvis. Heart size and mediastinal contours are within normal limits. Both lungs are clear. IMPRESSION: No evidence of bowel obstruction or ileus. No acute cardiopulmonary disease. Electronically Signed   By: Marijo Conception, M.D.   On: 03/26/2018 10:19  [4 week]   Impression: 32 year old male presents with recurrent vomiting, upper abdominal pain due to acute on chronic pancreatitis, presumed due to alcohol abuse.  Initial CT back in July with questionable annular pancreas.  Last CT September with improvement in the fluid collections previously seen.     Plan: 1. Clear liquid diet 2. Continue IV fluids 3. Pain management 4. Agree with the use of Creon, take with meals and with snacks.  Will supply patient with samples and patient assistance forms to help obtain Creon at time of discharge. 5. ETOH cessation.  6. Outpatient MRI pancreas once acute pancreatitis resolves  We would like to thank you for the opportunity to participate in the care of [employer].  Laureen Ochs. Bernarda Caffey [facility] [phone] 10/23/20193:06 PM     LOS: 1 day

## 2018-03-27 NOTE — Progress Notes (Signed)
CRITICAL VALUE ALERT  Critical Value:  Potassium 2.5  Date & Time Notied:  03/27/18 0705  Provider Notified: Dr. Carles Collet  Orders Received/Actions taken:

## 2018-03-27 NOTE — Clinical Social Work Note (Signed)
CSW met with pt to assist with resources.  Pt denies alcohol and substance abuse issues; states he is here because he was unable to afford medication related to pancreatitis. Furhtermore, stated GI Dr saw him today and said he would be provided with samples and help for affording on-going medication.  No further needs noted.

## 2018-03-28 ENCOUNTER — Telehealth: Payer: Self-pay | Admitting: Gastroenterology

## 2018-03-28 LAB — COMPREHENSIVE METABOLIC PANEL
ALT: 22 U/L (ref 0–44)
AST: 26 U/L (ref 15–41)
Albumin: 3.3 g/dL — ABNORMAL LOW (ref 3.5–5.0)
Alkaline Phosphatase: 59 U/L (ref 38–126)
Anion gap: 8 (ref 5–15)
BUN: 17 mg/dL (ref 6–20)
CO2: 32 mmol/L (ref 22–32)
Calcium: 8.9 mg/dL (ref 8.9–10.3)
Chloride: 96 mmol/L — ABNORMAL LOW (ref 98–111)
Creatinine, Ser: 0.83 mg/dL (ref 0.61–1.24)
GFR calc Af Amer: >60 mL/min (ref >60)
GFR calc non Af Amer: >60 mL/min (ref >60)
Glucose, Bld: 91 mg/dL (ref 70–99)
Potassium: 3.1 mmol/L — ABNORMAL LOW (ref 3.5–5.1)
Sodium: 136 mmol/L (ref 135–145)
Total Bilirubin: 0.9 mg/dL (ref 0.3–1.2)
Total Protein: 6.5 g/dL (ref 6.5–8.1)

## 2018-03-28 LAB — CBC
HCT: 38.9 % — ABNORMAL LOW (ref 39.0–52.0)
Hemoglobin: 12.7 g/dL — ABNORMAL LOW (ref 13.0–17.0)
MCH: 27.3 pg (ref 26.0–34.0)
MCHC: 32.6 g/dL (ref 30.0–36.0)
MCV: 83.5 fL (ref 80.0–100.0)
Platelets: 222 10*3/uL (ref 150–400)
RBC: 4.66 MIL/uL (ref 4.22–5.81)
RDW: 14.6 % (ref 11.5–15.5)
WBC: 8.2 10*3/uL (ref 4.0–10.5)
nRBC: 0 % (ref 0.0–0.2)

## 2018-03-28 MED ORDER — POTASSIUM CHLORIDE CRYS ER 20 MEQ PO TBCR
40.0000 meq | EXTENDED_RELEASE_TABLET | Freq: Once | ORAL | Status: AC
  Administered 2018-03-28: 40 meq via ORAL
  Filled 2018-03-28: qty 2

## 2018-03-28 MED ORDER — PANCRELIPASE (LIP-PROT-AMYL) 12000-38000 UNITS PO CPEP
36000.0000 [IU] | ORAL_CAPSULE | ORAL | Status: DC
  Administered 2018-03-28: 36000 [IU] via ORAL
  Filled 2018-03-28: qty 3

## 2018-03-28 MED ORDER — PANCRELIPASE (LIP-PROT-AMYL) 12000-38000 UNITS PO CPEP
72000.0000 [IU] | ORAL_CAPSULE | Freq: Three times a day (TID) | ORAL | Status: DC
  Administered 2018-03-28 – 2018-03-29 (×4): 72000 [IU] via ORAL
  Filled 2018-03-28 (×4): qty 6

## 2018-03-28 NOTE — Progress Notes (Signed)
PROGRESS NOTE  David Miranda UYQ:[id number] DOB: 01/10/1986 DOA: 03/26/2018 PCP: Patient, No Pcp Per  Brief History:  32 year old male with a history of chronic pancreatitis and alcohol abuse presenting with 2-day history of nausea, vomiting, and epigastric abdominal pain that began on 03/25/2018.  The patient was recently admitted to the hospital from 12/26/2017 through 01/03/2018 for acute alcoholic pancreatitis.  He was also treated for pneumonia at that time, and he was discharged with instructions to follow-up with gastroenterology in 6 weeks.  Unfortunately, he has not followed up.  He states that he has not taken any of his pancreatic enzyme replacement because of cost.  He denies any new medications or over-the-counter medications, but states that he has been taking 3 to 4 pills of NSAIDs over-the-counter for his epigastric abdominal pain.  He denies any alcohol use since his last admission, but then later states that he has not drank in 3 weeks.  He denies any illegal drug use.  He denies any fevers, chills, chest pain, shortness breath, diarrhea, hematochezia, melena, hematemesis.  Upon presentation, WBC was 12.2 with lipase 341.  UDS showed THC and opiates.  GI was consulted to assist with management.  Assessment/Plan: Acute on chronic pancreatitis -Likely related to alcohol -Patient is not particularly forthcoming regarding his alcohol use--he gives different answers at different times--"Aint really had nothing since last time" -GI consult appreciated -The patient had 3 separate CTs of his abdomen during his last admission.  Most recent CT 02/27/2018 shows changes of acute pancreatitis superimposed upon chronic changes. -Continue clear liquid diet>>>heart healthy -Continue IV fluids -Judicious opioids -noncompliant with pancreatic enzyme replacement -continue PPI -Certainly, his emesis could be attributed to cannabis hyperemesis syndrome-->improved  Alcohol abuse --Patient  is not particularly forthcoming regarding his alcohol use--he gives different answers at different times--"Aint really had nothing since last time" -alcohol withdrawal protocol -no signs of withdrawal  AKI -due to volume depletion -Baseline creatinine 0.6-0.9 -Serum creatinine peaked at 3.42 -Continue IV fluids-->improving  Hypokalemia/hypomagnesemia -Secondary to GI loss -Repleted  Tobacco abuse -I have discussed tobacco cessation with the patient.  I have counseled the patient regarding the negative impacts of continued tobacco use including but not limited to lung cancer, COPD, and cardiovascular disease.  I have discussed alternatives to tobacco and modalities that may help facilitate tobacco cessation including but not limited to biofeedback, hypnosis, and medications.  Total time spent with tobacco counseling was 4 minutes. -nicoderm patch       Disposition Plan:   Home 03/29/18 if stable Family Communication:  No Family at bedside  Consultants:  GI  Code Status:  FULL  DVT Prophylaxis:  [place] Heparin    Procedures: As Listed in Progress Note Above  Antibiotics: None     Subjective: Pt continues to c/o of pain in epigastric area, but improving.  Denies vomiting, but occasionally nauseous.  No f/c, cp, sob, diarrhea, hematochezia, melena  Objective: Vitals:   03/28/18 0522 03/28/18 0829 03/28/18 0856 03/28/18 1318  BP: (!) 114/95 108/86  (!) 127/93  Pulse: (!) 53 (!) 102  (!) 54  Resp: 18   18  Temp: 98.3 F (36.8 C)   (!) 97.5 F (36.4 C)  TempSrc: Oral   Oral  SpO2: 98% (!) 83% 97% 100%  Weight:      Height:        Intake/Output Summary (Last 24 hours) at 03/28/2018 1434 Last data filed at 03/28/2018 0900 Gross per  24 hour  Intake 1640.98 ml  Output -  Net 1640.98 ml   Weight change:  Exam:   General:  Pt is alert, follows commands appropriately, not in acute distress  HEENT: No icterus, No thrush, No neck mass,  [place]/AT  Cardiovascular: RRR, S1/S2, no rubs, no gallops  Respiratory: fine bibasilar rales, no wheeze  Abdomen: Soft/+BS, epigastric tender, non distended, no guarding  Extremities: No edema, No lymphangitis, No petechiae, No rashes, no synovitis   Data Reviewed: I have personally reviewed following labs and imaging studies Basic Metabolic Panel: Recent Labs  Lab 03/26/18 0934 03/27/18 0522 03/28/18 0503  NA 132* 133* 136  K <2.0* 2.5* 3.1*  CL 65* 88* 96*  CO2 37* 36* 32  GLUCOSE 210* 118* 91  BUN 46* 30* 17  CREATININE 3.42* 1.33* 0.83  CALCIUM 10.1 8.6* 8.9  MG 1.6* 2.4  --   PHOS 5.1*  --   --    Liver Function Tests: Recent Labs  Lab 03/26/18 0934 03/28/18 0503  AST 46* 26  ALT 34 22  ALKPHOS 95 59  BILITOT 1.5* 0.9  PROT 10.1* 6.5  ALBUMIN 5.3* 3.3*   Recent Labs  Lab 03/26/18 0934 03/27/18 0522  LIPASE 341* 289*   No results for input(s): AMMONIA in the last 168 hours. Coagulation Profile: No results for input(s): INR, PROTIME in the last 168 hours. CBC: Recent Labs  Lab 03/26/18 0934 03/27/18 0522 03/28/18 0503  WBC 12.2* 9.2 8.2  NEUTROABS 8.9*  --   --   HGB 18.0* 13.4 12.7*  HCT 52.8* 40.2 38.9*  MCV 79.5* 81.7 83.5  PLT 350 241 222   Cardiac Enzymes: No results for input(s): CKTOTAL, CKMB, CKMBINDEX, TROPONINI in the last 168 hours. BNP: Invalid input(s): POCBNP CBG: No results for input(s): GLUCAP in the last 168 hours. HbA1C: No results for input(s): HGBA1C in the last 72 hours. Urine analysis:    Component Value Date/Time   COLORURINE AMBER (A) 03/26/2018 0934   APPEARANCEUR CLOUDY (A) 03/26/2018 0934   LABSPEC 1.021 03/26/2018 0934   PHURINE 5.0 03/26/2018 [place] 03/26/2018 0934   HGBUR NEGATIVE 03/26/2018 0934   BILIRUBINUR NEGATIVE 03/26/2018 0934   KETONESUR 5 (A) 03/26/2018 0934   PROTEINUR 100 (A) 03/26/2018 0934   NITRITE NEGATIVE 03/26/2018 0934   LEUKOCYTESUR NEGATIVE 03/26/2018 0934    Sepsis Labs: @LABRCNTIP (procalcitonin:4,lacticidven:4) )No results found for this or any previous visit (from the past 240 hour(s)).   Scheduled Meds: . famotidine  20 mg Oral QHS  . folic acid  1 mg Oral Daily  . heparin injection (subcutaneous)  5,000 Units Subcutaneous Q8H  . lipase/protease/amylase  36,000 Units Oral With snacks  . lipase/protease/amylase  72,000 Units Oral TID WC  . nicotine  21 mg Transdermal Daily  . pantoprazole  40 mg Oral BID  . potassium chloride  40 mEq Oral Once  . thiamine  100 mg Oral Daily   Continuous Infusions: . sodium chloride    . 0.9 % NaCl with KCl 40 mEq / L 100 mL/hr (03/28/18 1124)    Procedures/Studies: Ct Abdomen Pelvis W Contrast  Result Date: 02/27/2018 CLINICAL DATA:  Upper abdominal pain for 2 days, history of pancreatitis EXAM: CT ABDOMEN AND PELVIS WITH CONTRAST TECHNIQUE: Multidetector CT imaging of the abdomen and pelvis was performed using the standard protocol following bolus administration of intravenous contrast. CONTRAST:  156mL ISOVUE-300 IOPAMIDOL (ISOVUE-300) INJECTION 61% COMPARISON:  01/03/2018 FINDINGS: Lower chest: No acute abnormality. Hepatobiliary:  The gallbladder is again well visualized and within normal limits. The liver again demonstrates geographic areas of decreased attenuation consistent with fatty infiltration. Pancreas: Persistent changes surrounding the pancreas consistent with pancreatitis. No definitive pancreatic necrosis is seen. The collection noted along the midbody is less prominent now measuring 2.9 x 1.5 cm. It previously measured 3.7 x 2.7 cm. The collection surrounding the celiac axis is not well appreciated consistent with resolution. Local inflammatory changes are noted surrounding the head with some associated involvement of the adjacent duodenum. The fluid collection adjacent to the tail has also decreased in size significantly. Spleen: Normal in size without focal abnormality. Adrenals/Urinary  Tract: Adrenal glands are within normal limits. The kidneys are well visualized bilaterally. No renal calculi or obstructive changes are seen. Left renal cyst is noted and stable. The bladder is decompressed. Stomach/Bowel: No obstructive or inflammatory changes of the larger small-bowel are seen with the exception of the changes surrounding the first and second portion of the duodenum. The appendix is within normal limits. The stomach is unremarkable. Vascular/Lymphatic: No significant vascular findings are present. No enlarged abdominal or pelvic lymph nodes. Reproductive: Prostate is unremarkable. Other: No abdominal wall hernia or abnormality. No abdominopelvic ascites. Musculoskeletal: No acute or significant osseous findings. IMPRESSION: Changes of acute pancreatitis superimposed on more chronic appearing changes (fluid collections) which are resolving when compared with the prior exam. The acute changes involve primarily the head and uncinate process. Some localized inflammatory change of the adjacent duodenum is noted as well. Stable geographic hepatic steatosis. Electronically Signed   By: Inez Catalina M.D.   On: 02/27/2018 16:06   Dg Abd Acute W/chest  Result Date: 03/26/2018 CLINICAL DATA:  Nausea, vomiting. EXAM: DG ABDOMEN ACUTE W/ 1V CHEST COMPARISON:  Radiograph of December 31, 2017. FINDINGS: There is no evidence of dilated bowel loops or free intraperitoneal air. Phlebolith is noted in the pelvis. Heart size and mediastinal contours are within normal limits. Both lungs are clear. IMPRESSION: No evidence of bowel obstruction or ileus. No acute cardiopulmonary disease. Electronically Signed   By: Marijo Conception, M.D.   On: 03/26/2018 10:19    Orson Eva, DO  [facility] Pager [phone]  If 7PM-7AM, please contact night-coverage [email or web address] Password [email or web address] 03/28/2018, 2:34 PM   LOS: 2 days

## 2018-03-28 NOTE — Telephone Encounter (Signed)
Faxed the pt's paperwork to the hospital to Neil Crouch, [place].

## 2018-03-28 NOTE — Telephone Encounter (Signed)
Please start patient assistance process for Creon 36,000, 2 with meals, 1 with snack, total of 8 per day.

## 2018-03-28 NOTE — Progress Notes (Signed)
Subjective:  Feels hungry. Pain improved but persistent. Didn't sleep well because of people in and out of room last night.   Objective: Vital signs in last 24 hours: Temp:  [97.5 F (36.4 C)-98.5 F (36.9 C)] 98.3 F (36.8 C) (10/24 0522) Pulse Rate:  [53-102] 102 (10/24 0829) Resp:  [17-18] 18 (10/24 0522) BP: (108-141)/(86-96) 108/86 (10/24 0829) SpO2:  [83 %-100 %] 97 % (10/24 0856) Last BM Date: 03/27/18 General:   Alert,  Well-developed, well-nourished, pleasant and cooperative in NAD Head:  Normocephalic and atraumatic. Eyes:  Sclera clear, no icterus.  Abdomen:  Soft, mild epig tenderness and nondistended.  Normal bowel sounds, without guarding, and without rebound.   Extremities:  Without clubbing, deformity or edema. Neurologic:  Alert and  oriented x4;  grossly normal neurologically. Skin:  Intact without significant lesions or rashes. Psych:  Alert and cooperative. Normal mood and affect.  Intake/Output from previous day: 10/23 0701 - 10/24 0700 In: 3222.8 [P.O.:720; I.V.:2102.8; IV Piggyback:400] Out: -  Intake/Output this shift: Total I/O In: 300 [P.O.:300] Out: -   Lab Results: CBC Recent Labs    03/26/18 0934 03/27/18 0522 03/28/18 0503  WBC 12.2* 9.2 8.2  HGB 18.0* 13.4 12.7*  HCT 52.8* 40.2 38.9*  MCV 79.5* David.7 83.5  PLT 350 241 222   BMET Recent Labs    03/26/18 0934 03/27/18 0522 03/28/18 0503  NA 132* 133* 136  K <2.0* 2.5* 3.1*  CL 65* 88* 96*  CO2 37* 36* 32  GLUCOSE 210* 118* 91  BUN 46* 30* 17  CREATININE 3.42* 1.33* 0.83  CALCIUM 10.1 8.6* 8.9   LFTs Recent Labs    03/26/18 0934 03/28/18 0503  BILITOT 1.5* 0.9  ALKPHOS 95 59  AST 46* 26  ALT 34 22  PROT 10.1* 6.5  ALBUMIN 5.3* 3.3*   Recent Labs    03/26/18 0934 03/27/18 0522  LIPASE 341* 289*   PT/INR No results for input(s): LABPROT, INR in the last 72 hours.    Imaging Studies: Ct Abdomen Pelvis W Contrast  Result Date: 02/27/2018 CLINICAL DATA:  Upper  abdominal pain for 2 days, history of pancreatitis EXAM: CT ABDOMEN AND PELVIS WITH CONTRAST TECHNIQUE: Multidetector CT imaging of the abdomen and pelvis was performed using the standard protocol following bolus administration of intravenous contrast. CONTRAST:  150mL ISOVUE-300 IOPAMIDOL (ISOVUE-300) INJECTION 61% COMPARISON:  01/03/2018 FINDINGS: Lower chest: No acute abnormality. Hepatobiliary: The gallbladder is again well visualized and within normal limits. The liver again demonstrates geographic areas of decreased attenuation consistent with fatty infiltration. Pancreas: Persistent changes surrounding the pancreas consistent with pancreatitis. No definitive pancreatic necrosis is seen. The collection noted along the midbody is less prominent now measuring 2.9 x 1.5 cm. It previously measured 3.7 x 2.7 cm. The collection surrounding the celiac axis is not well appreciated consistent with resolution. Local inflammatory changes are noted surrounding the head with some associated involvement of the adjacent duodenum. The fluid collection adjacent to the tail has also decreased in size significantly. Spleen: Normal in size without focal abnormality. Adrenals/Urinary Tract: Adrenal glands are within normal limits. The kidneys are well visualized bilaterally. No renal calculi or obstructive changes are seen. Left renal cyst is noted and stable. The bladder is decompressed. Stomach/Bowel: No obstructive or inflammatory changes of the larger small-bowel are seen with the exception of the changes surrounding the first and second portion of the duodenum. The appendix is within normal limits. The stomach is unremarkable. Vascular/Lymphatic: No significant vascular findings  are present. No enlarged abdominal or pelvic lymph nodes. Reproductive: Prostate is unremarkable. Other: No abdominal wall hernia or abnormality. No abdominopelvic ascites. Musculoskeletal: No acute or significant osseous findings. IMPRESSION: Changes  of acute pancreatitis superimposed on more chronic appearing changes (fluid collections) which are resolving when compared with the prior exam. The acute changes involve primarily the head and uncinate process. Some localized inflammatory change of the adjacent duodenum is noted as well. Stable geographic hepatic steatosis. Electronically Signed   By: Inez Catalina M.D.   On: 02/27/2018 16:06   Dg Abd Acute W/chest  Result Date: 03/26/2018 CLINICAL DATA:  Nausea, vomiting. EXAM: DG ABDOMEN ACUTE W/ 1V CHEST COMPARISON:  Radiograph of December 31, 2017. FINDINGS: There is no evidence of dilated bowel loops or free intraperitoneal air. Phlebolith is noted in the pelvis. Heart size and mediastinal contours are within normal limits. Both lungs are clear. IMPRESSION: No evidence of bowel obstruction or ileus. No acute cardiopulmonary disease. Electronically Signed   By: Marijo Conception, M.D.   On: 03/26/2018 10:19  [2 weeks]   Assessment: 32 year old Miranda presents with recurrent vomiting, upper abdominal pain due to acute on chronic pancreatitis, presumed due to alcohol abuse.  Patient has alcohol use since his last hospitalization several weeks ago.  Back in July initial CT with questionable annular pancreas.  Last CT September with improvement in fluid collections previously seen.  Clinically patient is feeling better, vomiting has resolved.  Tolerated clear liquids.  Patient feels hungry.  Abdominal pain improved but persistent.  Plan: 1. Low-fat diet. 2. Creon 72,000 units with meals, 36,000 units with snacks.  (8 per day) Patient educated to take Creon as he sits down to eat not before or after.  We currently are out of samples, patient assistance forms initiated. 3. Recommend alcohol cessation 4. Outpatient MRI pancreas once acute inflammation resolves. 5. Replace potassium per attending.  Laureen Ochs. Bernarda Caffey [facility] [phone] 10/24/201910:14 AM     LOS: 2  days

## 2018-03-28 NOTE — Telephone Encounter (Signed)
Prescriber  Page on [employer] to complete.

## 2018-03-29 ENCOUNTER — Telehealth: Payer: Self-pay | Admitting: Gastroenterology

## 2018-03-29 DIAGNOSIS — K760 Fatty (change of) liver, not elsewhere classified: Secondary | ICD-10-CM

## 2018-03-29 DIAGNOSIS — F1010 Alcohol abuse, uncomplicated: Secondary | ICD-10-CM

## 2018-03-29 DIAGNOSIS — Z720 Tobacco use: Secondary | ICD-10-CM

## 2018-03-29 DIAGNOSIS — K219 Gastro-esophageal reflux disease without esophagitis: Secondary | ICD-10-CM

## 2018-03-29 DIAGNOSIS — E876 Hypokalemia: Secondary | ICD-10-CM

## 2018-03-29 DIAGNOSIS — K861 Other chronic pancreatitis: Secondary | ICD-10-CM

## 2018-03-29 DIAGNOSIS — K859 Acute pancreatitis without necrosis or infection, unspecified: Secondary | ICD-10-CM

## 2018-03-29 DIAGNOSIS — F1210 Cannabis abuse, uncomplicated: Secondary | ICD-10-CM

## 2018-03-29 DIAGNOSIS — R112 Nausea with vomiting, unspecified: Secondary | ICD-10-CM

## 2018-03-29 LAB — COMPREHENSIVE METABOLIC PANEL
ALT: 22 U/L (ref 0–44)
AST: 25 U/L (ref 15–41)
Albumin: 3.3 g/dL — ABNORMAL LOW (ref 3.5–5.0)
Alkaline Phosphatase: 56 U/L (ref 38–126)
Anion gap: 6 (ref 5–15)
BUN: 10 mg/dL (ref 6–20)
CO2: 27 mmol/L (ref 22–32)
Calcium: 8.9 mg/dL (ref 8.9–10.3)
Chloride: 105 mmol/L (ref 98–111)
Creatinine, Ser: 0.74 mg/dL (ref 0.61–1.24)
GFR calc Af Amer: >60 mL/min (ref >60)
GFR calc non Af Amer: >60 mL/min (ref >60)
Glucose, Bld: 75 mg/dL (ref 70–99)
Potassium: 3.7 mmol/L (ref 3.5–5.1)
Sodium: 138 mmol/L (ref 135–145)
Total Bilirubin: 0.8 mg/dL (ref 0.3–1.2)
Total Protein: 6.2 g/dL — ABNORMAL LOW (ref 6.5–8.1)

## 2018-03-29 LAB — LIPASE, BLOOD: Lipase: 143 U/L — ABNORMAL HIGH (ref 11–51)

## 2018-03-29 MED ORDER — PANCRELIPASE (LIP-PROT-AMYL) 36000-114000 UNITS PO CPEP: 72000.0000 [IU] | ORAL_CAPSULE | Freq: Three times a day (TID) | ORAL | Status: DC

## 2018-03-29 MED ORDER — OMEPRAZOLE 40 MG PO CPDR: 20.0000 mg | DELAYED_RELEASE_CAPSULE | Freq: Every day | ORAL | 1 refills | Status: DC

## 2018-03-29 MED ORDER — HYDROCODONE-ACETAMINOPHEN 5-325 MG PO TABS: 1.0000 | ORAL_TABLET | ORAL | 0 refills | Status: DC | PRN

## 2018-03-29 NOTE — Discharge Summary (Addendum)
Physician Discharge Summary  David Miranda MGQ:[id number] DOB: 11/26/1985 DOA: 03/26/2018  PCP: Patient, No Pcp Per  Admit date: 03/26/2018 Discharge date: 03/29/2018  Admitted From: Home Disposition:  Home   Recommendations for Outpatient Follow-up:  1. Follow up with PCP in 1-2 weeks 2. Please obtain BMP/CBC in one week     Discharge Condition: Stable CODE STATUS:FULL Diet recommendation: soft  Brief/Interim Summary: 32 year old male with a history of chronic pancreatitis and alcohol abuse presenting with 2-day history of nausea, vomiting, and epigastric abdominal pain that began on 03/25/2018. The patient was recently admitted to the hospital from 12/26/2017 through 01/03/2018 for acute alcoholic pancreatitis. He was also treated for pneumonia at that time, and he was discharged with instructions to follow-up with gastroenterology in 6 weeks. Unfortunately, he has not followed up. He states that he has not taken any of his pancreatic enzyme replacement because of cost. He denies any new medications or over-the-counter medications, but states that he has been taking 3 to 4 pills of NSAIDs over-the-counter for his epigastric abdominal pain. He denies any alcohol use since his last admission, but then later states that he has not drank in 3 weeks. He denies any illegal drug use. He denies any fevers, chills, chest pain, shortness breath, diarrhea, hematochezia, melena, hematemesis. Upon presentation, WBC was 12.2 with lipase 341. UDS showed THC and opiates. GI was consulted to assist with management.  They agreed with continued supportive care and IVF.  They helped arrange medication assistance for Creon.  Pt was placed on bowel rest and his diet was gradually advanced which he ultimately tolerated.  Discharge Diagnoses:   Acute on chronic pancreatitis -Likely related to alcohol -Patient is not particularly forthcoming regarding his alcohol use--he gives different answers at  different times--"Aintreally had nothingsince last time" -GI consult appreciated -The patient had 3 separate CTs of his abdomen during his last admission. Most recent CT 02/27/2018 shows changes of acute pancreatitis superimposed upon chronic changes. -Continue clear liquid diet>>>heart healthy which pt tolerated -Continued IV fluids -Judicious opioids -noncompliant with pancreatic enzyme replacement -continue PPI -Certainly, his emesis could be attributed to cannabis hyperemesis syndrome-->improved  Alcohol abuse --Patient is not particularly forthcoming regarding his alcohol use--he gives different answers at different times--"Aintreally had nothingsince last time" -alcohol withdrawal protocol -no signs of withdrawal  AKI -due to volumedepletion -Baseline creatinine 0.6-0.9 -Serum creatinine peaked at 3.42 -Continue IV fluids-->improved -serum creatinine 0.74 on day of dc  Hypokalemia/hypomagnesemia -Secondary to GI loss -Repleted  Tobacco abuse -I have discussed tobacco cessation with the patient. I have counseled the patient regarding the negative impacts of continued tobacco use including but not limited to lung cancer, COPD, and cardiovascular disease. I have discussed alternatives to tobacco and modalities that may help facilitate tobacco cessation including but not limited to biofeedback, hypnosis, and medications. Total time spent with tobacco counseling was 4 minutes. -nicoderm patch  Hyponatremia -due to volume depletion -improved with IVF   Discharge Instructions   Allergies as of 03/29/2018   No Known Allergies     Medication List    TAKE these medications   HYDROcodone-acetaminophen 5-325 MG tablet Commonly known as:  NORCO/VICODIN Take 1 tablet by mouth every 4 (four) hours as needed for moderate pain.   lipase/protease/amylase 36000 UNITS Cpep capsule Commonly known as:  CREON Take 2 capsules (72,000 Units total) by mouth 3 (three)  times daily with meals. What changed:    medication strength  how much to take   omeprazole 40 MG  capsule Commonly known as:  PRILOSEC Take 1 capsule (40 mg total) by mouth daily. What changed:    medication strength  how much to take   Potassium 99 MG Tabs Take 1 tablet by mouth daily.       No Known Allergies  Consultations:  [facility]   Procedures/Studies: Ct Abdomen Pelvis W Contrast  Result Date: 02/27/2018 CLINICAL DATA:  Upper abdominal pain for 2 days, history of pancreatitis EXAM: CT ABDOMEN AND PELVIS WITH CONTRAST TECHNIQUE: Multidetector CT imaging of the abdomen and pelvis was performed using the standard protocol following bolus administration of intravenous contrast. CONTRAST:  159mL ISOVUE-300 IOPAMIDOL (ISOVUE-300) INJECTION 61% COMPARISON:  01/03/2018 FINDINGS: Lower chest: No acute abnormality. Hepatobiliary: The gallbladder is again well visualized and within normal limits. The liver again demonstrates geographic areas of decreased attenuation consistent with fatty infiltration. Pancreas: Persistent changes surrounding the pancreas consistent with pancreatitis. No definitive pancreatic necrosis is seen. The collection noted along the midbody is less prominent now measuring 2.9 x 1.5 cm. It previously measured 3.7 x 2.7 cm. The collection surrounding the celiac axis is not well appreciated consistent with resolution. Local inflammatory changes are noted surrounding the head with some associated involvement of the adjacent duodenum. The fluid collection adjacent to the tail has also decreased in size significantly. Spleen: Normal in size without focal abnormality. Adrenals/Urinary Tract: Adrenal glands are within normal limits. The kidneys are well visualized bilaterally. No renal calculi or obstructive changes are seen. Left renal cyst is noted and stable. The bladder is decompressed. Stomach/Bowel: No obstructive or inflammatory changes of the larger  small-bowel are seen with the exception of the changes surrounding the first and second portion of the duodenum. The appendix is within normal limits. The stomach is unremarkable. Vascular/Lymphatic: No significant vascular findings are present. No enlarged abdominal or pelvic lymph nodes. Reproductive: Prostate is unremarkable. Other: No abdominal wall hernia or abnormality. No abdominopelvic ascites. Musculoskeletal: No acute or significant osseous findings. IMPRESSION: Changes of acute pancreatitis superimposed on more chronic appearing changes (fluid collections) which are resolving when compared with the prior exam. The acute changes involve primarily the head and uncinate process. Some localized inflammatory change of the adjacent duodenum is noted as well. Stable geographic hepatic steatosis. Electronically Signed   By: Inez Catalina M.D.   On: 02/27/2018 16:06   Dg Abd Acute W/chest  Result Date: 03/26/2018 CLINICAL DATA:  Nausea, vomiting. EXAM: DG ABDOMEN ACUTE W/ 1V CHEST COMPARISON:  Radiograph of December 31, 2017. FINDINGS: There is no evidence of dilated bowel loops or free intraperitoneal air. Phlebolith is noted in the pelvis. Heart size and mediastinal contours are within normal limits. Both lungs are clear. IMPRESSION: No evidence of bowel obstruction or ileus. No acute cardiopulmonary disease. Electronically Signed   By: Marijo Conception, M.D.   On: 03/26/2018 10:19         Discharge Exam: Vitals:   03/29/18 0539 03/29/18 0803  BP: 107/80   Pulse: (!) 43   Resp:    Temp: 98.1 F (36.7 C)   SpO2: 100% 95%   Vitals:   03/28/18 1318 03/28/18 2106 03/29/18 0539 03/29/18 0803  BP: (!) 127/93 (!) 136/97 107/80   Pulse: (!) 54 65 (!) 43   Resp: 18     Temp: (!) 97.5 F (36.4 C) 98.2 F (36.8 C) 98.1 F (36.7 C)   TempSrc: Oral Oral Oral   SpO2: 100% 100% 100% 95%  Weight:      Height:  General: Pt is alert, awake, not in acute distress Cardiovascular: RRR, S1/S2 +,  no rubs, no gallops Respiratory: CTA bilaterally, no wheezing, no rhonchi Abdominal: Soft,epigastric, ND, bowel sounds + Extremities: no edema, no cyanosis   The results of significant diagnostics from this hospitalization (including imaging, microbiology, ancillary and laboratory) are listed below for reference.    Significant Diagnostic Studies: Ct Abdomen Pelvis W Contrast  Result Date: 02/27/2018 CLINICAL DATA:  Upper abdominal pain for 2 days, history of pancreatitis EXAM: CT ABDOMEN AND PELVIS WITH CONTRAST TECHNIQUE: Multidetector CT imaging of the abdomen and pelvis was performed using the standard protocol following bolus administration of intravenous contrast. CONTRAST:  168mL ISOVUE-300 IOPAMIDOL (ISOVUE-300) INJECTION 61% COMPARISON:  01/03/2018 FINDINGS: Lower chest: No acute abnormality. Hepatobiliary: The gallbladder is again well visualized and within normal limits. The liver again demonstrates geographic areas of decreased attenuation consistent with fatty infiltration. Pancreas: Persistent changes surrounding the pancreas consistent with pancreatitis. No definitive pancreatic necrosis is seen. The collection noted along the midbody is less prominent now measuring 2.9 x 1.5 cm. It previously measured 3.7 x 2.7 cm. The collection surrounding the celiac axis is not well appreciated consistent with resolution. Local inflammatory changes are noted surrounding the head with some associated involvement of the adjacent duodenum. The fluid collection adjacent to the tail has also decreased in size significantly. Spleen: Normal in size without focal abnormality. Adrenals/Urinary Tract: Adrenal glands are within normal limits. The kidneys are well visualized bilaterally. No renal calculi or obstructive changes are seen. Left renal cyst is noted and stable. The bladder is decompressed. Stomach/Bowel: No obstructive or inflammatory changes of the larger small-bowel are seen with the exception of the  changes surrounding the first and second portion of the duodenum. The appendix is within normal limits. The stomach is unremarkable. Vascular/Lymphatic: No significant vascular findings are present. No enlarged abdominal or pelvic lymph nodes. Reproductive: Prostate is unremarkable. Other: No abdominal wall hernia or abnormality. No abdominopelvic ascites. Musculoskeletal: No acute or significant osseous findings. IMPRESSION: Changes of acute pancreatitis superimposed on more chronic appearing changes (fluid collections) which are resolving when compared with the prior exam. The acute changes involve primarily the head and uncinate process. Some localized inflammatory change of the adjacent duodenum is noted as well. Stable geographic hepatic steatosis. Electronically Signed   By: Inez Catalina M.D.   On: 02/27/2018 16:06   Dg Abd Acute W/chest  Result Date: 03/26/2018 CLINICAL DATA:  Nausea, vomiting. EXAM: DG ABDOMEN ACUTE W/ 1V CHEST COMPARISON:  Radiograph of December 31, 2017. FINDINGS: There is no evidence of dilated bowel loops or free intraperitoneal air. Phlebolith is noted in the pelvis. Heart size and mediastinal contours are within normal limits. Both lungs are clear. IMPRESSION: No evidence of bowel obstruction or ileus. No acute cardiopulmonary disease. Electronically Signed   By: Marijo Conception, M.D.   On: 03/26/2018 10:19     Microbiology: No results found for this or any previous visit (from the past 240 hour(s)).   Labs: Basic Metabolic Panel: Recent Labs  Lab 03/26/18 0934 03/27/18 0522 03/28/18 0503 03/29/18 0427  NA 132* 133* 136 138  K <2.0* 2.5* 3.1* 3.7  CL 65* 88* 96* 105  CO2 37* 36* 32 27  GLUCOSE 210* 118* 91 75  BUN 46* 30* 17 10  CREATININE 3.42* 1.33* 0.83 0.74  CALCIUM 10.1 8.6* 8.9 8.9  MG 1.6* 2.4  --   --   PHOS 5.1*  --   --   --  Liver Function Tests: Recent Labs  Lab 03/26/18 0934 03/28/18 0503 03/29/18 0427  AST 46* 26 25  ALT 34 22 22    ALKPHOS 95 59 56  BILITOT 1.5* 0.9 0.8  PROT 10.1* 6.5 6.2*  ALBUMIN 5.3* 3.3* 3.3*   Recent Labs  Lab 03/26/18 0934 03/27/18 0522 03/29/18 0427  LIPASE 341* 289* 143*   No results for input(s): AMMONIA in the last 168 hours. CBC: Recent Labs  Lab 03/26/18 0934 03/27/18 0522 03/28/18 0503  WBC 12.2* 9.2 8.2  NEUTROABS 8.9*  --   --   HGB 18.0* 13.4 12.7*  HCT 52.8* 40.2 38.9*  MCV 79.5* 81.7 83.5  PLT 350 241 222   Cardiac Enzymes: No results for input(s): CKTOTAL, CKMB, CKMBINDEX, TROPONINI in the last 168 hours. BNP: Invalid input(s): POCBNP CBG: No results for input(s): GLUCAP in the last 168 hours.  Time coordinating discharge:  36 minutes  Signed:  Orson Eva, DO [facility] Pager: [phone] 03/29/2018, 11:42 AM

## 2018-03-29 NOTE — Progress Notes (Signed)
IV removed, WNL. D.C instructions given to pt. Verbalized understanding. Pt spouse at bedside to transport home.

## 2018-03-29 NOTE — Telephone Encounter (Signed)
Needs MRI pancreas, pancreatic protocol in 6 weeks. Dx: recurrent pancreatitis, ?annular pancreas  OV to follow mri. Please schedule.

## 2018-03-29 NOTE — Progress Notes (Signed)
Subjective: Abdominal pain, N/V essentially resolved at this time. Has GERD and on PPI at home which helps. No other GI complaints.   Objective: Vital signs in last 24 hours: Temp:  [97.5 F (36.4 C)-98.2 F (36.8 C)] 98.1 F (36.7 C) (10/25 0539) Pulse Rate:  [43-65] 43 (10/25 0539) Resp:  [18] 18 (10/24 1318) BP: (107-136)/(80-97) 107/80 (10/25 0539) SpO2:  [95 %-100 %] 95 % (10/25 0803) Last BM Date: 03/28/18 General:   Alert and oriented, pleasant Head:  Normocephalic and atraumatic. Eyes:  No icterus, sclera clear. Conjuctiva pink.  Mouth:  Without lesions, mucosa pink and moist.  Neck:  Supple, without thyromegaly or masses.  Heart:  S1, S2 present, no murmurs noted.  Lungs: Clear to auscultation bilaterally, without wheezing, rales, or rhonchi.  Abdomen:  Bowel sounds present, soft, non-tender, non-distended. No HSM or hernias noted. No rebound or guarding. No masses appreciated  Msk:  Symmetrical without gross deformities. Normal posture. Pulses:  Normal pulses noted. Extremities:  Without clubbing or edema. Neurologic:  Alert and  oriented x4;  grossly normal neurologically. Skin:  Warm and dry, intact without significant lesions.  Cervical Nodes:  No significant cervical adenopathy. Psych:  Alert and cooperative. Normal mood and affect.  Intake/Output from previous day: 10/24 0701 - 10/25 0700 In: 3125.7 [P.O.:540; I.V.:2585.7] Out: -  Intake/Output this shift: No intake/output data recorded.  Lab Results: Recent Labs    03/26/18 0934 03/27/18 0522 03/28/18 0503  WBC 12.2* 9.2 8.2  HGB 18.0* 13.4 12.7*  HCT 52.8* 40.2 38.9*  PLT 350 241 222   BMET Recent Labs    03/27/18 0522 03/28/18 0503 03/29/18 0427  NA 133* 136 138  K 2.5* 3.1* 3.7  CL 88* 96* 105  CO2 36* 32 27  GLUCOSE 118* 91 75  BUN 30* 17 10  CREATININE 1.33* 0.83 0.74  CALCIUM 8.6* 8.9 8.9   LFT Recent Labs    03/26/18 0934 03/28/18 0503 03/29/18 0427  PROT 10.1* 6.5 6.2*   ALBUMIN 5.3* 3.3* 3.3*  AST 46* 26 25  ALT 34 22 22  ALKPHOS 95 59 56  BILITOT 1.5* 0.9 0.8   PT/INR No results for input(s): LABPROT, INR in the last 72 hours. Hepatitis Panel No results for input(s): HEPBSAG, HCVAB, HEPAIGM, HEPBIGM in the last 72 hours.   Studies/Results: No results found.  Assessment: 32 year old male presents with recurrent vomiting, upper abdominal pain due to acute on chronic pancreatitis, presumed due to alcohol abuse.  Patient has alcohol use since his last hospitalization several weeks ago.  Back in July initial CT with questionable annular pancreas.  Last CT September with improvement in fluid collections previously seen.  Clinically patient is feeling better, vomiting has resolved.  Tolerated clear liquids.  Patient feels hungry.  Abdominal pain improved but persistent.  His lipase today has improved significantly from 289 yesterday to 143 today. CMP essentially normal. Referral process for Creon patient assistance program has been initiated by our office.  Today he states his abdominal pain, nausea has resolved. He is tolerating his diet. He wants to go home. We discussed STRONG recommendation for ETOH cessation and her verbalized understanding.  Plan: 1. Anticipate d/c in the next 24 hours 2. Recommend low fat diet at home 3. Follow-up with GI in 8 weeks 4. Will need outpatient MRI when acute scenario resolved. 5. Creon 72,000 units with each meal and 36,000 units with snacks (8 per day). Patient assistance paperwork in progress. 6. Supportive measures  Thank you for allowing [place] to participate in the care of [place], DNP, AGNP-C Adult & Gerontological Nurse Practitioner [facility]     LOS: 3 days    03/29/2018, 8:30 AM

## 2018-04-01 NOTE — Telephone Encounter (Signed)
MRI scheduled for 05/14/18 at 9:00am, arrive at 8:30am. NPO for 4 hours prior to test. Tried to call pt, no answer, LMOVM for return call.

## 2018-04-01 NOTE — Telephone Encounter (Signed)
Pt called office, informed of MRI appt. OV scheduled for 05/15/18 at 11:30am. Address updated. Appt letters mailed.

## 2018-04-01 NOTE — Telephone Encounter (Signed)
completed

## 2018-04-01 NOTE — Addendum Note (Signed)
Addended by: Hassan Rowan on: 04/01/2018 08:13 AM   Modules accepted: Orders

## 2018-04-03 NOTE — Telephone Encounter (Signed)
LMOM for a return call and that we need the paperwork for the assistance for the Creon.

## 2018-05-14 ENCOUNTER — Ambulatory Visit (HOSPITAL_COMMUNITY): Admission: RE | Admit: 2018-05-14 | Payer: Self-pay | Source: Ambulatory Visit

## 2018-05-15 ENCOUNTER — Ambulatory Visit: Payer: Self-pay | Admitting: Gastroenterology

## 2018-07-24 ENCOUNTER — Other Ambulatory Visit: Payer: Self-pay

## 2018-07-24 ENCOUNTER — Inpatient Hospital Stay (HOSPITAL_COMMUNITY)
Admission: EM | Admit: 2018-07-24 | Discharge: 2018-07-27 | DRG: 439 | Disposition: A | Discharge status: Home / Self Care | Payer: Self-pay | Attending: Family Medicine | Admitting: Family Medicine

## 2018-07-24 ENCOUNTER — Emergency Department (HOSPITAL_COMMUNITY): Payer: Self-pay

## 2018-07-24 ENCOUNTER — Encounter (HOSPITAL_COMMUNITY): Payer: Self-pay | Admitting: Emergency Medicine

## 2018-07-24 DIAGNOSIS — I10 Essential (primary) hypertension: Secondary | ICD-10-CM | POA: Diagnosis present

## 2018-07-24 DIAGNOSIS — F1010 Alcohol abuse, uncomplicated: Secondary | ICD-10-CM | POA: Diagnosis present

## 2018-07-24 DIAGNOSIS — K8591 Acute pancreatitis with uninfected necrosis, unspecified: Secondary | ICD-10-CM | POA: Diagnosis present

## 2018-07-24 DIAGNOSIS — K859 Acute pancreatitis without necrosis or infection, unspecified: Secondary | ICD-10-CM | POA: Diagnosis present

## 2018-07-24 DIAGNOSIS — D72829 Elevated white blood cell count, unspecified: Secondary | ICD-10-CM | POA: Diagnosis present

## 2018-07-24 DIAGNOSIS — K8521 Alcohol induced acute pancreatitis with uninfected necrosis: Principal | ICD-10-CM | POA: Diagnosis present

## 2018-07-24 DIAGNOSIS — K760 Fatty (change of) liver, not elsewhere classified: Secondary | ICD-10-CM | POA: Diagnosis present

## 2018-07-24 DIAGNOSIS — Z720 Tobacco use: Secondary | ICD-10-CM | POA: Diagnosis present

## 2018-07-24 DIAGNOSIS — K852 Alcohol induced acute pancreatitis without necrosis or infection: Secondary | ICD-10-CM

## 2018-07-24 DIAGNOSIS — F17210 Nicotine dependence, cigarettes, uncomplicated: Secondary | ICD-10-CM | POA: Diagnosis present

## 2018-07-24 DIAGNOSIS — E876 Hypokalemia: Secondary | ICD-10-CM | POA: Diagnosis present

## 2018-07-24 DIAGNOSIS — K219 Gastro-esophageal reflux disease without esophagitis: Secondary | ICD-10-CM | POA: Diagnosis present

## 2018-07-24 DIAGNOSIS — F1210 Cannabis abuse, uncomplicated: Secondary | ICD-10-CM | POA: Diagnosis present

## 2018-07-24 DIAGNOSIS — F10188 Alcohol abuse with other alcohol-induced disorder: Secondary | ICD-10-CM | POA: Diagnosis present

## 2018-07-24 LAB — COMPREHENSIVE METABOLIC PANEL
ALT: 31 U/L (ref 0–44)
AST: 30 U/L (ref 15–41)
Albumin: 4.5 g/dL (ref 3.5–5.0)
Alkaline Phosphatase: 73 U/L (ref 38–126)
Anion gap: 12 (ref 5–15)
BUN: 7 mg/dL (ref 6–20)
CO2: 24 mmol/L (ref 22–32)
Calcium: 9.7 mg/dL (ref 8.9–10.3)
Chloride: 102 mmol/L (ref 98–111)
Creatinine, Ser: 0.95 mg/dL (ref 0.61–1.24)
GFR calc Af Amer: >60 mL/min (ref >60)
GFR calc non Af Amer: >60 mL/min (ref >60)
Glucose, Bld: 114 mg/dL — ABNORMAL HIGH (ref 70–99)
Potassium: 3.4 mmol/L — ABNORMAL LOW (ref 3.5–5.1)
Sodium: 138 mmol/L (ref 135–145)
Total Bilirubin: 0.4 mg/dL (ref 0.3–1.2)
Total Protein: 8.5 g/dL — ABNORMAL HIGH (ref 6.5–8.1)

## 2018-07-24 LAB — LIPASE, BLOOD: Lipase: 731 U/L — ABNORMAL HIGH (ref 11–51)

## 2018-07-24 LAB — CBC
HCT: 46 % (ref 39.0–52.0)
Hemoglobin: 13.9 g/dL (ref 13.0–17.0)
MCH: 22.3 pg — ABNORMAL LOW (ref 26.0–34.0)
MCHC: 30.2 g/dL (ref 30.0–36.0)
MCV: 73.8 fL — ABNORMAL LOW (ref 80.0–100.0)
Platelets: 443 10*3/uL — ABNORMAL HIGH (ref 150–400)
RBC: 6.23 MIL/uL — ABNORMAL HIGH (ref 4.22–5.81)
RDW: 18.7 % — ABNORMAL HIGH (ref 11.5–15.5)
WBC: 16.1 10*3/uL — ABNORMAL HIGH (ref 4.0–10.5)
nRBC: 0 % (ref 0.0–0.2)

## 2018-07-24 LAB — PHOSPHORUS: Phosphorus: 3.6 mg/dL (ref 2.5–4.6)

## 2018-07-24 LAB — ETHANOL: Alcohol, Ethyl (B): 106 mg/dL — ABNORMAL HIGH (ref <10)

## 2018-07-24 LAB — MAGNESIUM: Magnesium: 1.9 mg/dL (ref 1.7–2.4)

## 2018-07-24 MED ORDER — HYDROMORPHONE HCL 1 MG/ML IJ SOLN
1.0000 mg | Freq: Once | INTRAMUSCULAR | Status: AC
  Administered 2018-07-24: 1 mg via INTRAVENOUS
  Filled 2018-07-24: qty 1

## 2018-07-24 MED ORDER — SODIUM CHLORIDE 0.9 % IV BOLUS
1000.0000 mL | Freq: Once | INTRAVENOUS | Status: AC
  Administered 2018-07-24: 1000 mL via INTRAVENOUS

## 2018-07-24 MED ORDER — LORAZEPAM 2 MG/ML IJ SOLN
2.0000 mg | INTRAMUSCULAR | Status: DC | PRN
  Administered 2018-07-25: 3 mg via INTRAVENOUS
  Administered 2018-07-25: 2 mg via INTRAVENOUS
  Filled 2018-07-24: qty 2
  Filled 2018-07-24: qty 1

## 2018-07-24 MED ORDER — IOHEXOL 300 MG/ML  SOLN
100.0000 mL | Freq: Once | INTRAMUSCULAR | Status: AC | PRN
  Administered 2018-07-24: 100 mL via INTRAVENOUS

## 2018-07-24 MED ORDER — THIAMINE HCL 100 MG/ML IJ SOLN
Freq: Once | INTRAVENOUS | Status: AC
  Administered 2018-07-25: 02:00:00 via INTRAVENOUS
  Filled 2018-07-24: qty 1000

## 2018-07-24 MED ORDER — THIAMINE HCL 100 MG/ML IJ SOLN
INTRAMUSCULAR | Status: AC
  Filled 2018-07-24: qty 2

## 2018-07-24 MED ORDER — ONDANSETRON HCL 4 MG/2ML IJ SOLN
4.0000 mg | Freq: Once | INTRAMUSCULAR | Status: AC
  Administered 2018-07-24: 4 mg via INTRAVENOUS
  Filled 2018-07-24: qty 2

## 2018-07-24 MED ORDER — SODIUM CHLORIDE 0.9% FLUSH
3.0000 mL | Freq: Once | INTRAVENOUS | Status: AC
  Administered 2018-07-24: 3 mL via INTRAVENOUS

## 2018-07-24 MED ORDER — HYDROMORPHONE HCL 1 MG/ML IJ SOLN
1.0000 mg | INTRAMUSCULAR | Status: DC | PRN
  Administered 2018-07-25 – 2018-07-26 (×8): 1 mg via INTRAVENOUS
  Filled 2018-07-24 (×8): qty 1

## 2018-07-24 MED ORDER — MAGNESIUM SULFATE 2 GM/50ML IV SOLN
2.0000 g | Freq: Once | INTRAVENOUS | Status: AC
  Administered 2018-07-25: 2 g via INTRAVENOUS
  Filled 2018-07-24: qty 50

## 2018-07-24 MED ORDER — THIAMINE HCL 100 MG/ML IJ SOLN
100.0000 mg | Freq: Every day | INTRAMUSCULAR | Status: DC
  Administered 2018-07-25 – 2018-07-27 (×3): 100 mg via INTRAVENOUS
  Filled 2018-07-24 (×3): qty 2

## 2018-07-24 MED ORDER — M.V.I. ADULT IV INJ
INJECTION | INTRAVENOUS | Status: AC
  Filled 2018-07-24: qty 10

## 2018-07-24 NOTE — ED Triage Notes (Signed)
Pt complaining of abd pain since last night. Pt reports 3 glasses of wine, 25oz beer last night. Pt states had pancreatitis two-three months ago. Pt reports increased pain since late last night. Pt states pain is all over his abd. Pt states emesis once.

## 2018-07-25 DIAGNOSIS — I10 Essential (primary) hypertension: Secondary | ICD-10-CM

## 2018-07-25 DIAGNOSIS — K8591 Acute pancreatitis with uninfected necrosis, unspecified: Secondary | ICD-10-CM

## 2018-07-25 DIAGNOSIS — F1010 Alcohol abuse, uncomplicated: Secondary | ICD-10-CM

## 2018-07-25 DIAGNOSIS — K859 Acute pancreatitis without necrosis or infection, unspecified: Secondary | ICD-10-CM

## 2018-07-25 LAB — URINALYSIS, ROUTINE W REFLEX MICROSCOPIC
Bilirubin Urine: NEGATIVE
Glucose, UA: NEGATIVE mg/dL
Hgb urine dipstick: NEGATIVE
Ketones, ur: NEGATIVE mg/dL
Leukocytes,Ua: NEGATIVE
Nitrite: NEGATIVE
Protein, ur: NEGATIVE mg/dL
Specific Gravity, Urine: >1.046 — ABNORMAL HIGH (ref 1.005–1.030)
pH: 5 (ref 5.0–8.0)

## 2018-07-25 LAB — COMPREHENSIVE METABOLIC PANEL
ALT: 20 U/L (ref 0–44)
AST: 18 U/L (ref 15–41)
Albumin: 3.3 g/dL — ABNORMAL LOW (ref 3.5–5.0)
Alkaline Phosphatase: 55 U/L (ref 38–126)
Anion gap: 5 (ref 5–15)
BUN: 9 mg/dL (ref 6–20)
CO2: 25 mmol/L (ref 22–32)
Calcium: 8.1 mg/dL — ABNORMAL LOW (ref 8.9–10.3)
Chloride: 110 mmol/L (ref 98–111)
Creatinine, Ser: 0.93 mg/dL (ref 0.61–1.24)
GFR calc Af Amer: >60 mL/min (ref >60)
GFR calc non Af Amer: >60 mL/min (ref >60)
Glucose, Bld: 95 mg/dL (ref 70–99)
Potassium: 4 mmol/L (ref 3.5–5.1)
Sodium: 140 mmol/L (ref 135–145)
Total Bilirubin: 0.6 mg/dL (ref 0.3–1.2)
Total Protein: 6.1 g/dL — ABNORMAL LOW (ref 6.5–8.1)

## 2018-07-25 LAB — CBC WITH DIFFERENTIAL/PLATELET
Abs Immature Granulocytes: 0.21 10*3/uL — ABNORMAL HIGH (ref 0.00–0.07)
Basophils Absolute: 0.1 10*3/uL (ref 0.0–0.1)
Basophils Relative: 1 %
Eosinophils Absolute: 0.1 10*3/uL (ref 0.0–0.5)
Eosinophils Relative: 1 %
HCT: 38.6 % — ABNORMAL LOW (ref 39.0–52.0)
Hemoglobin: 11.7 g/dL — ABNORMAL LOW (ref 13.0–17.0)
Immature Granulocytes: 2 %
Lymphocytes Relative: 29 %
Lymphs Abs: 2.9 10*3/uL (ref 0.7–4.0)
MCH: 23 pg — ABNORMAL LOW (ref 26.0–34.0)
MCHC: 30.3 g/dL (ref 30.0–36.0)
MCV: 75.8 fL — ABNORMAL LOW (ref 80.0–100.0)
Monocytes Absolute: 1.2 10*3/uL — ABNORMAL HIGH (ref 0.1–1.0)
Monocytes Relative: 12 %
Neutro Abs: 5.7 10*3/uL (ref 1.7–7.7)
Neutrophils Relative %: 55 %
Platelets: 327 10*3/uL (ref 150–400)
RBC: 5.09 MIL/uL (ref 4.22–5.81)
RDW: 17.5 % — ABNORMAL HIGH (ref 11.5–15.5)
WBC: 10.2 10*3/uL (ref 4.0–10.5)
nRBC: 0 % (ref 0.0–0.2)

## 2018-07-25 LAB — RAPID URINE DRUG SCREEN, HOSP PERFORMED
Amphetamines: NOT DETECTED
Barbiturates: NOT DETECTED
Benzodiazepines: POSITIVE — AB
Cocaine: NOT DETECTED
Opiates: POSITIVE — AB
Tetrahydrocannabinol: POSITIVE — AB

## 2018-07-25 LAB — GLUCOSE, CAPILLARY
Glucose-Capillary: 79 mg/dL (ref 70–99)
Glucose-Capillary: 90 mg/dL (ref 70–99)

## 2018-07-25 LAB — LIPASE, BLOOD: Lipase: 1076 U/L — ABNORMAL HIGH (ref 11–51)

## 2018-07-25 MED ORDER — KETOROLAC TROMETHAMINE 30 MG/ML IJ SOLN
30.0000 mg | Freq: Once | INTRAMUSCULAR | Status: AC
  Administered 2018-07-25: 30 mg via INTRAVENOUS
  Filled 2018-07-25: qty 1

## 2018-07-25 MED ORDER — METOPROLOL TARTRATE 5 MG/5ML IV SOLN
5.0000 mg | Freq: Once | INTRAVENOUS | Status: AC
  Administered 2018-07-25: 5 mg via INTRAVENOUS
  Filled 2018-07-25: qty 5

## 2018-07-25 MED ORDER — POTASSIUM CHLORIDE IN NACL 20-0.9 MEQ/L-% IV SOLN
INTRAVENOUS | Status: DC
  Administered 2018-07-25: 02:00:00 via INTRAVENOUS
  Filled 2018-07-25: qty 1000

## 2018-07-25 MED ORDER — POTASSIUM CHLORIDE IN NACL 20-0.9 MEQ/L-% IV SOLN
INTRAVENOUS | Status: DC
  Administered 2018-07-25 – 2018-07-27 (×4): via INTRAVENOUS
  Filled 2018-07-25: qty 1000

## 2018-07-25 MED ORDER — FAMOTIDINE IN NACL 20-0.9 MG/50ML-% IV SOLN
20.0000 mg | Freq: Two times a day (BID) | INTRAVENOUS | Status: DC
  Administered 2018-07-25 – 2018-07-26 (×5): 20 mg via INTRAVENOUS
  Filled 2018-07-25 (×6): qty 50

## 2018-07-25 MED ORDER — NICOTINE 21 MG/24HR TD PT24
21.0000 mg | MEDICATED_PATCH | Freq: Every day | TRANSDERMAL | Status: DC | PRN
  Administered 2018-07-25 – 2018-07-26 (×2): 21 mg via TRANSDERMAL
  Filled 2018-07-25 (×2): qty 1

## 2018-07-25 NOTE — Progress Notes (Signed)
COVERAGE NOTE   07/25/2018 9:28 AM  David Miranda was seen and examined.  The H&P by the admitting provider, orders, imaging was reviewed.  Please see new orders.  Will continue to follow.   Vitals:   07/25/18 0530 07/25/18 0555  BP: (!) 124/92 (!) 124/92  Pulse:  76  Resp:    Temp:    SpO2:      Results for orders placed or performed during the hospital encounter of 07/24/18  Lipase, blood  Result Value Ref Range   Lipase 731 (H) 11 - 51 U/L  Comprehensive metabolic panel  Result Value Ref Range   Sodium 138 135 - 145 mmol/L   Potassium 3.4 (L) 3.5 - 5.1 mmol/L   Chloride 102 98 - 111 mmol/L   CO2 24 22 - 32 mmol/L   Glucose, Bld 114 (H) 70 - 99 mg/dL   BUN 7 6 - 20 mg/dL   Creatinine, Ser 0.95 0.61 - 1.24 mg/dL   Calcium 9.7 8.9 - 10.3 mg/dL   Total Protein 8.5 (H) 6.5 - 8.1 g/dL   Albumin 4.5 3.5 - 5.0 g/dL   AST 30 15 - 41 U/L   ALT 31 0 - 44 U/L   Alkaline Phosphatase 73 38 - 126 U/L   Total Bilirubin 0.4 0.3 - 1.2 mg/dL   GFR calc non Af Amer >60 >60 mL/min   GFR calc Af Amer >60 >60 mL/min   Anion gap 12 5 - 15  CBC  Result Value Ref Range   WBC 16.1 (H) 4.0 - 10.5 K/uL   RBC 6.23 (H) 4.22 - 5.81 MIL/uL   Hemoglobin 13.9 13.0 - 17.0 g/dL   HCT 46.0 39.0 - 52.0 %   MCV 73.8 (L) 80.0 - 100.0 fL   MCH 22.3 (L) 26.0 - 34.0 pg   MCHC 30.2 30.0 - 36.0 g/dL   RDW 18.7 (H) 11.5 - 15.5 %   Platelets 443 (H) 150 - 400 K/uL   nRBC 0.0 0.0 - 0.2 %  Urinalysis, Routine w reflex microscopic  Result Value Ref Range   Color, Urine YELLOW YELLOW   APPearance CLEAR CLEAR   Specific Gravity, Urine >1.046 (H) 1.005 - 1.030   pH 5.0 5.0 - 8.0   Glucose, UA NEGATIVE NEGATIVE mg/dL   Hgb urine dipstick NEGATIVE NEGATIVE   Bilirubin Urine NEGATIVE NEGATIVE   Ketones, ur NEGATIVE NEGATIVE mg/dL   Protein, ur NEGATIVE NEGATIVE mg/dL   Nitrite NEGATIVE NEGATIVE   Leukocytes,Ua NEGATIVE NEGATIVE  Ethanol  Result Value Ref Range   Alcohol, Ethyl (B) 106 (H) <10 mg/dL   Magnesium  Result Value Ref Range   Magnesium 1.9 1.7 - 2.4 mg/dL  Lipase, blood  Result Value Ref Range   Lipase 1,076 (H) 11 - 51 U/L  CBC with Differential/Platelet  Result Value Ref Range   WBC 10.2 4.0 - 10.5 K/uL   RBC 5.09 4.22 - 5.81 MIL/uL   Hemoglobin 11.7 (L) 13.0 - 17.0 g/dL   HCT 38.6 (L) 39.0 - 52.0 %   MCV 75.8 (L) 80.0 - 100.0 fL   MCH 23.0 (L) 26.0 - 34.0 pg   MCHC 30.3 30.0 - 36.0 g/dL   RDW 17.5 (H) 11.5 - 15.5 %   Platelets 327 150 - 400 K/uL   nRBC 0.0 0.0 - 0.2 %   Neutrophils Relative % 55 %   Neutro Abs 5.7 1.7 - 7.7 K/uL   Lymphocytes Relative 29 %   Lymphs Abs 2.9  0.7 - 4.0 K/uL   Monocytes Relative 12 %   Monocytes Absolute 1.2 (H) 0.1 - 1.0 K/uL   Eosinophils Relative 1 %   Eosinophils Absolute 0.1 0.0 - 0.5 K/uL   Basophils Relative 1 %   Basophils Absolute 0.1 0.0 - 0.1 K/uL   Immature Granulocytes 2 %   Abs Immature Granulocytes 0.21 (H) 0.00 - 0.07 K/uL  Comprehensive metabolic panel  Result Value Ref Range   Sodium 140 135 - 145 mmol/L   Potassium 4.0 3.5 - 5.1 mmol/L   Chloride 110 98 - 111 mmol/L   CO2 25 22 - 32 mmol/L   Glucose, Bld 95 70 - 99 mg/dL   BUN 9 6 - 20 mg/dL   Creatinine, Ser 0.93 0.61 - 1.24 mg/dL   Calcium 8.1 (L) 8.9 - 10.3 mg/dL   Total Protein 6.1 (L) 6.5 - 8.1 g/dL   Albumin 3.3 (L) 3.5 - 5.0 g/dL   AST 18 15 - 41 U/L   ALT 20 0 - 44 U/L   Alkaline Phosphatase 55 38 - 126 U/L   Total Bilirubin 0.6 0.3 - 1.2 mg/dL   GFR calc non Af Amer >60 >60 mL/min   GFR calc Af Amer >60 >60 mL/min   Anion gap 5 5 - 15  Phosphorus  Result Value Ref Range   Phosphorus 3.6 2.5 - 4.6 mg/dL   Murvin Natal, MD [facility]   07/24/2018  9:18 PM How to contact the [facility] Attending or Consulting provider [place] or covering provider during after hours 7P -7A, for this patient?  1. Check the care team in [facility] and look for a) attending/consulting [facility] provider listed and b) the [facility] team listed 2. Log into [email or web address] and use  [facility]'s universal password to access. If you do not have the password, please contact the hospital operator. 3. Locate the [facility] provider you are looking for under [facility] and page to a number that you can be directly reached. 4. If you still have difficulty reaching the provider, please page the [facility] (Director on Call) for the Hospitalists listed on amion for assistance.

## 2018-07-25 NOTE — ED Provider Notes (Signed)
[facility] Provider Note   CSN: [id number] Arrival date & time: 07/24/18  1914    History   Chief Complaint Chief Complaint  Patient presents with  . Abdominal Pain    HPI Naszir Cott is a 33 y.o. male.     HPI   33 year old male with abdominal pain.  Upper abdomen.  Reports began last night.  He has a past history of pancreatitis and he feels like symptoms may be from this again.  Reports a past history of alcohol use but he is said he was abstaining until last night because it was his mother's birthday.  States that he had 3 glasses of wine and 2 beers.  He began having pain shortly later.  Associate with nausea and is vomited multiple times.  No fevers.  No diarrhea.  No urinary complaints.  Past Medical History:  Diagnosis Date  . Pancreatitis     Patient Active Problem List   Diagnosis Date Noted  . Acute necrotizing pancreatitis 07/24/2018  . Cannabis abuse 03/27/2018  . Acute kidney injury ([place])   . Hypomagnesemia   . Acute on chronic pancreatitis ([place]) 03/26/2018  . GERD (gastroesophageal reflux disease) 03/26/2018  . Acute renal failure (ARF) ([place]) 03/26/2018  . Hypokalemia 02/27/2018  . Essential hypertension 02/27/2018  . Tobacco abuse 02/27/2018  . Acute pancreatitis 02/27/2018  . Constipation   . Annular pancreas   . Pancreatitis   . Non-intractable vomiting with nausea   . Hepatic steatosis   . 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 Leukocytosis 12/27/2017  . Abdominal pain, epigastric 12/27/2017  . Acute alcoholic pancreatitis 67/61/9509  . Alcohol abuse 12/26/2017    History reviewed. No pertinent surgical history.      Home Medications    Prior to Admission medications   Medication Sig Start Date End Date Taking? Authorizing Provider  Potassium 99 MG TABS Take 1 tablet by mouth once.    Yes [provider]  HYDROcodone-acetaminophen (NORCO/VICODIN) 5-325 MG tablet Take 1 tablet by mouth every 4 (four)  hours as needed for moderate pain. Patient not taking: Reported on 07/24/2018 03/29/18 03/29/19  Orson Eva, MD  lipase/protease/amylase (CREON) 36000 UNITS CPEP capsule Take 2 capsules (72,000 Units total) by mouth 3 (three) times daily with meals. Patient not taking: Reported on 07/24/2018 03/29/18   Orson Eva, MD  omeprazole (PRILOSEC) 40 MG capsule Take 1 capsule (40 mg total) by mouth daily. Patient not taking: Reported on 07/24/2018 03/29/18   Orson Eva, MD    Family History Family History  Problem Relation Age of Onset  . Cancer Other     Social History Social History   Tobacco Use  . Smoking status: Current Every Day Smoker    Packs/day: 1.00    Types: Cigarettes  . Smokeless tobacco: Never Used  Substance Use Topics  . Alcohol use: Yes    Comment: last large amount of alcohol last night 2-18/20  . Drug use: Not Currently    Types: Marijuana    Comment: occasionally     Allergies   Patient has no known allergies.   Review of Systems Review of Systems  All systems reviewed and negative, other than as noted in HPI.  Physical Exam Updated Vital Signs BP (!) 140/95 (BP Location: Left Arm)   Pulse 93   Temp 99 F (37.2 C) (Oral)   Resp 19   Ht 6' (1.829 m)   Wt 79.4 kg   SpO2 98%   BMI 23.73  kg/m   Physical Exam Vitals signs and nursing note reviewed.  Constitutional:      General: He is not in acute distress.    Appearance: He is well-developed.  HENT:     Head: Normocephalic and atraumatic.  Eyes:     General:        Right eye: No discharge.        Left eye: No discharge.     Conjunctiva/sclera: Conjunctivae normal.  Neck:     Musculoskeletal: Neck supple.  Cardiovascular:     Rate and Rhythm: Normal rate and regular rhythm.     Heart sounds: Normal heart sounds. No murmur. No friction rub. No gallop.   Pulmonary:     Effort: Pulmonary effort is normal. No respiratory distress.     Breath sounds: Normal breath sounds.  Abdominal:      General: There is no distension.     Palpations: Abdomen is soft.     Tenderness: There is abdominal tenderness in the epigastric area. There is guarding.  Musculoskeletal:        General: No tenderness.  Skin:    General: Skin is warm and dry.  Neurological:     Mental Status: He is alert.  Psychiatric:        Behavior: Behavior normal.        Thought Content: Thought content normal.      ED Treatments / Results  Labs (all labs ordered are listed, but only abnormal results are displayed) Labs Reviewed  LIPASE, BLOOD - Abnormal; Notable for the following components:      Result Value   Lipase 731 (*)    All other components within normal limits  COMPREHENSIVE METABOLIC PANEL - Abnormal; Notable for the following components:   Potassium 3.4 (*)    Glucose, Bld 114 (*)    Total Protein 8.5 (*)    All other components within normal limits  CBC - Abnormal; Notable for the following components:   WBC 16.1 (*)    RBC 6.23 (*)    MCV 73.8 (*)    MCH 22.3 (*)    RDW 18.7 (*)    Platelets 443 (*)    All other components within normal limits  URINALYSIS, ROUTINE W REFLEX MICROSCOPIC - Abnormal; Notable for the following components:   Specific Gravity, Urine >1.046 (*)    All other components within normal limits  ETHANOL - Abnormal; Notable for the following components:   Alcohol, Ethyl (B) 106 (*)    All other components within normal limits  LIPASE, BLOOD - Abnormal; Notable for the following components:   Lipase 1,076 (*)    All other components within normal limits  CBC WITH DIFFERENTIAL/PLATELET - Abnormal; Notable for the following components:   Hemoglobin 11.7 (*)    HCT 38.6 (*)    MCV 75.8 (*)    MCH 23.0 (*)    RDW 17.5 (*)    Monocytes Absolute 1.2 (*)    Abs Immature Granulocytes 0.21 (*)    All other components within normal limits  COMPREHENSIVE METABOLIC PANEL - Abnormal; Notable for the following components:   Calcium 8.1 (*)    Total Protein 6.1 (*)     Albumin 3.3 (*)    All other components within normal limits  RAPID URINE DRUG SCREEN, HOSP PERFORMED - Abnormal; Notable for the following components:   Opiates POSITIVE (*)    Benzodiazepines POSITIVE (*)    Tetrahydrocannabinol POSITIVE (*)    All other components  within normal limits  MAGNESIUM  PHOSPHORUS  GLUCOSE, CAPILLARY  GLUCOSE, CAPILLARY  LIPASE, BLOOD  CBC WITH DIFFERENTIAL/PLATELET  COMPREHENSIVE METABOLIC PANEL  MAGNESIUM    EKG None  Radiology Ct Abdomen Pelvis W Contrast  Result Date: 07/24/2018 CLINICAL DATA:  Abdominal pain since last night. History of pancreatitis and alcohol abuse. EXAM: CT ABDOMEN AND PELVIS WITH CONTRAST TECHNIQUE: Multidetector CT imaging of the abdomen and pelvis was performed using the standard protocol following bolus administration of intravenous contrast. CONTRAST:  155mL OMNIPAQUE IOHEXOL 300 MG/ML  SOLN COMPARISON:  CT abdomen and pelvis February 27, 2018 FINDINGS: LOWER CHEST: Lung bases are clear. Included heart size is normal. No pericardial effusion. HEPATOBILIARY: Focal fatty infiltration about the falciform ligament. Diffusely hypodense liver consistent with hepatic steatosis. Normal gallbladder. PANCREAS: Enlarged edematous pancreas with patchy areas of pancreatic head hypoenhancement. Peripancreatic fat stranding. No pseudocyst, ductal dilatation or calcifications. SPLEEN: Normal. ADRENALS/URINARY TRACT: Kidneys are orthotopic, demonstrating symmetric enhancement. No nephrolithiasis, hydronephrosis or solid renal masses. 12 mm cyst LEFT interpolar kidney. Too small to characterize hypodensities bilateral kidneys. The unopacified ureters are normal in course and caliber. Delayed imaging through the kidneys demonstrates symmetric prompt contrast excretion within the proximal urinary collecting system. Urinary bladder is partially distended and unremarkable. Normal adrenal glands. STOMACH/BOWEL: The stomach, small and large bowel are normal  in course and caliber without inflammatory changes. Normal appendix. VASCULAR/LYMPHATIC: Aortoiliac vessels are normal in course and caliber. No lymphadenopathy by CT size criteria. REPRODUCTIVE: Normal. OTHER: Small Peri pancreatic/retropharyngeal effusion. MUSCULOSKELETAL: Nonacute.  Moderate L5-S1 degenerative disc. IMPRESSION: 1. Acute recurrent pancreatitis, potential early pancreatic head necrosis. Peripancreatic effusion without focal fluid collection. Electronically Signed   By: Elon Alas M.D.   On: 07/24/2018 22:30    Procedures Procedures (including critical care time)  Medications Ordered in ED Medications  HYDROmorphone (DILAUDID) injection 1 mg (1 mg Intravenous Given 07/25/18 2054)  LORazepam (ATIVAN) injection 2-3 mg (2 mg Intravenous Given 07/25/18 1508)  thiamine (B-1) injection 100 mg (100 mg Intravenous Given 07/25/18 1113)  famotidine (PEPCID) IVPB 20 mg premix (20 mg Intravenous New Bag/Given 07/25/18 2102)  0.9 % NaCl with KCl 20 mEq/ L  infusion ( Intravenous New Bag/Given 07/25/18 2100)  nicotine (NICODERM CQ - dosed in mg/24 hours) patch 21 mg (21 mg Transdermal Patch Applied 07/25/18 0400)  sodium chloride flush (NS) 0.9 % injection 3 mL (3 mLs Intravenous Given 07/24/18 2207)  sodium chloride 0.9 % bolus 1,000 mL (0 mLs Intravenous Stopped 07/24/18 2347)  HYDROmorphone (DILAUDID) injection 1 mg (1 mg Intravenous Given 07/24/18 2206)  ondansetron (ZOFRAN) injection 4 mg (4 mg Intravenous Given 07/24/18 2204)  iohexol (OMNIPAQUE) 300 MG/ML solution 100 mL (100 mLs Intravenous Contrast Given 07/24/18 2211)  HYDROmorphone (DILAUDID) injection 1 mg (1 mg Intravenous Given 07/24/18 2346)  ondansetron (ZOFRAN) injection 4 mg (4 mg Intravenous Given 07/24/18 2350)  sodium chloride 0.9 % 1,000 mL with thiamine 387 mg, folic acid 1 mg, multivitamins adult 10 mL infusion ( Intravenous Stopped 07/25/18 0934)  magnesium sulfate IVPB 2 g 50 mL (0 g Intravenous Stopped 07/25/18 0131)    ketorolac (TORADOL) 30 MG/ML injection 30 mg (30 mg Intravenous Given 07/25/18 0055)  metoprolol tartrate (LOPRESSOR) injection 5 mg (5 mg Intravenous Given 07/25/18 0056)     Initial Impression / Assessment and Plan / ED Course  I have reviewed the triage vital signs and the nursing notes.  Pertinent labs & imaging results that were available during my care of the patient were  reviewed by me and considered in my medical decision making (see chart for details).        33 year old male with abdominal pain and nausea/vomiting.  He has pancreatitis.  History of the same.  Recent drinking.  Lipase is elevated.  CT with inflammatory changes around the pancreas and possible area of necrosis.  He was treated with IV fluids, antiemetics and pain medication.  He still remains significantly symptomatic.  Will admit for ongoing treatment.  Final Clinical Impressions(s) / ED Diagnoses   Final diagnoses:  Alcohol-induced acute pancreatitis, unspecified complication status    ED Discharge Orders    None       Virgel Manifold, MD 07/25/18 2140

## 2018-07-25 NOTE — H&P (Signed)
History and Physical    David Miranda ZDG:[id number] DOB: 1985-07-12 DOA: 07/24/2018  PCP: Patient, No Pcp Per   Patient coming from: Home.  I have personally briefly reviewed patient's old medical records in [place]  Chief Complaint: Abdominal pain.  HPI: David Miranda is a 33 y.o. male with medical history significant of pancreatitis after having, alcohol abuse, tobacco abuse who is coming to the emergency department with complaints of progressively worse abdominal pain associated with an episode of emesis and nausea since last night after he had 3 glasses of wine and about 25 ounces of beer yesterday evening during his mother's birthday.  He states that he had not had any alcohol consumption in the last 3 months.  He denies fever, chills, diarrhea, hematemesis, melena or hematochezia.  No sore throat, rhinorrhea, hemoptysis, wheezing, chest pain, palpitations, diaphoresis, PND, orthopnea or pitting edema of the lower extremities.  He denies dysuria, frequency or hematuria.  No polyuria, polydipsia, polyphagia or blurred vision.  He denies skin rashes or pruritus.  ED Course: Initial vital signs temperature 36.6 F, pulse 100, respiration 18, blood pressure 145/93 mmHg and O2 sat 100% on room air.  Patient received a 1000 mL NS bolus, Zofran 4 mg IVP x2 and hydromorphone 1 mg IVP x2.  I added Toradol 30 mg IVP x1.  His white count was 16.1, hemoglobin 13.9 g/dL and platelets 443.  Lipase was 731 units/L.  CMP shows a potassium of 3.4 mmol/L.  Glucose of 114 mg/dL and total protein of 8.5 g/dL.  All other values, including calcium level, are within normal limits.  Ethanol was 106, magnesium 1.9 and phosphorus 3.6 mg/dL.  Imaging: CT abdomen/pelvis shows acute recurrent pancreatitis, potential early pancreatic head necrosis.  Peripancreatic effusion without focal fluid collection.  Review of Systems: As per HPI otherwise 10 point review of systems negative.   Past Medical History:    Diagnosis Date  . Pancreatitis     History reviewed. No pertinent surgical history.   reports that he has been smoking cigarettes. He has been smoking about 1.00 pack per day. He has never used smokeless tobacco. He reports current alcohol use. He reports previous drug use. Drug: Marijuana.  No Known Allergies  Family History  Problem Relation Age of Onset  . Cancer Other    Prior to Admission medications   Medication Sig Start Date End Date Taking? Authorizing Provider  Potassium 99 MG TABS Take 1 tablet by mouth once.    Yes [provider]  HYDROcodone-acetaminophen (NORCO/VICODIN) 5-325 MG tablet Take 1 tablet by mouth every 4 (four) hours as needed for moderate pain. Patient not taking: Reported on 07/24/2018 03/29/18 03/29/19  Orson Eva, MD  lipase/protease/amylase (CREON) 36000 UNITS CPEP capsule Take 2 capsules (72,000 Units total) by mouth 3 (three) times daily with meals. Patient not taking: Reported on 07/24/2018 03/29/18   Orson Eva, MD  omeprazole (PRILOSEC) 40 MG capsule Take 1 capsule (40 mg total) by mouth daily. Patient not taking: Reported on 07/24/2018 03/29/18   Orson Eva, MD    Physical Exam: Vitals:   07/24/18 1924 07/24/18 1926 07/24/18 2230  BP:  (!) 145/93 123/76  Pulse:  100 64  Resp:  18   Temp:  97.9 F (36.6 C)   TempSrc:  Oral   SpO2:  100% 96%  Weight: 79.4 kg    Height: 6' (1.829 m)      Constitutional: In moderate distress due to pain. Eyes: PERRL, lids and conjunctivae are  mildly injected. ENMT: Mucous membranes are mildly dry. Posterior pharynx clear of any exudate or lesions. Neck: normal, supple, no masses, no thyromegaly Respiratory: clear to auscultation bilaterally, no wheezing, no crackles. Normal respiratory effort. No accessory muscle use.  Cardiovascular: Tachycardic at 104 bpm, no murmurs / rubs / gallops. No extremity edema. 2+ pedal pulses. No carotid bruits.  Abdomen: Nondistended, soft, positive for gastric  tenderness, no guarding or rebound, no masses palpated. No hepatosplenomegaly. Bowel sounds positive.  Musculoskeletal: no clubbing / cyanosis. Good ROM, no contractures. Normal muscle tone.  Skin: no rashes, lesions, ulcers on limited dermatological examination. Neurologic: CN 2-12 grossly intact. Sensation intact, DTR normal. Strength 5/5 in all 4.  Psychiatric: Normal judgment and insight. Alert and oriented x 4..  Mildly anxious mood.   Labs on Admission: I have personally reviewed following labs and imaging studies  CBC: Recent Labs  Lab 07/24/18 2028  WBC 16.1*  HGB 13.9  HCT 46.0  MCV 73.8*  PLT 193*   Basic Metabolic Panel: Recent Labs  Lab 07/24/18 2028  NA 138  K 3.4*  CL 102  CO2 24  GLUCOSE 114*  BUN 7  CREATININE 0.95  CALCIUM 9.7  MG 1.9  PHOS 3.6   GFR: Estimated Creatinine Clearance: 122.5 mL/min (by C-G formula based on SCr of 0.95 mg/dL). Liver Function Tests: Recent Labs  Lab 07/24/18 2028  AST 30  ALT 31  ALKPHOS 73  BILITOT 0.4  PROT 8.5*  ALBUMIN 4.5   Recent Labs  Lab 07/24/18 2028  LIPASE 731*   No results for input(s): AMMONIA in the last 168 hours. Coagulation Profile: No results for input(s): INR, PROTIME in the last 168 hours. Cardiac Enzymes: No results for input(s): CKTOTAL, CKMB, CKMBINDEX, TROPONINI in the last 168 hours. BNP (last 3 results) No results for input(s): PROBNP in the last 8760 hours. HbA1C: No results for input(s): HGBA1C in the last 72 hours. CBG: No results for input(s): GLUCAP in the last 168 hours. Lipid Profile: No results for input(s): CHOL, HDL, LDLCALC, TRIG, CHOLHDL, LDLDIRECT in the last 72 hours. Thyroid Function Tests: No results for input(s): TSH, T4TOTAL, FREET4, T3FREE, THYROIDAB in the last 72 hours. Anemia Panel: No results for input(s): VITAMINB12, FOLATE, FERRITIN, TIBC, IRON, RETICCTPCT in the last 72 hours. Urine analysis:    Component Value Date/Time   COLORURINE AMBER (A)  03/26/2018 0934   APPEARANCEUR CLOUDY (A) 03/26/2018 0934   LABSPEC 1.021 03/26/2018 0934   PHURINE 5.0 03/26/2018 [place] 03/26/2018 0934   HGBUR NEGATIVE 03/26/2018 0934   BILIRUBINUR NEGATIVE 03/26/2018 0934   KETONESUR 5 (A) 03/26/2018 0934   PROTEINUR 100 (A) 03/26/2018 0934   NITRITE NEGATIVE 03/26/2018 0934   LEUKOCYTESUR NEGATIVE 03/26/2018 0934    Radiological Exams on Admission: Ct Abdomen Pelvis W Contrast  Result Date: 07/24/2018 CLINICAL DATA:  Abdominal pain since last night. History of pancreatitis and alcohol abuse. EXAM: CT ABDOMEN AND PELVIS WITH CONTRAST TECHNIQUE: Multidetector CT imaging of the abdomen and pelvis was performed using the standard protocol following bolus administration of intravenous contrast. CONTRAST:  170mL OMNIPAQUE IOHEXOL 300 MG/ML  SOLN COMPARISON:  CT abdomen and pelvis February 27, 2018 FINDINGS: LOWER CHEST: Lung bases are clear. Included heart size is normal. No pericardial effusion. HEPATOBILIARY: Focal fatty infiltration about the falciform ligament. Diffusely hypodense liver consistent with hepatic steatosis. Normal gallbladder. PANCREAS: Enlarged edematous pancreas with patchy areas of pancreatic head hypoenhancement. Peripancreatic fat stranding. No pseudocyst, ductal dilatation or calcifications.  SPLEEN: Normal. ADRENALS/URINARY TRACT: Kidneys are orthotopic, demonstrating symmetric enhancement. No nephrolithiasis, hydronephrosis or solid renal masses. 12 mm cyst LEFT interpolar kidney. Too small to characterize hypodensities bilateral kidneys. The unopacified ureters are normal in course and caliber. Delayed imaging through the kidneys demonstrates symmetric prompt contrast excretion within the proximal urinary collecting system. Urinary bladder is partially distended and unremarkable. Normal adrenal glands. STOMACH/BOWEL: The stomach, small and large bowel are normal in course and caliber without inflammatory changes. Normal  appendix. VASCULAR/LYMPHATIC: Aortoiliac vessels are normal in course and caliber. No lymphadenopathy by CT size criteria. REPRODUCTIVE: Normal. OTHER: Small Peri pancreatic/retropharyngeal effusion. MUSCULOSKELETAL: Nonacute.  Moderate L5-S1 degenerative disc. IMPRESSION: 1. Acute recurrent pancreatitis, potential early pancreatic head necrosis. Peripancreatic effusion without focal fluid collection. Electronically Signed   By: Elon Alas M.D.   On: 07/24/2018 22:30    EKG: Independently reviewed.   Assessment/Plan Principal Problem:   Acute necrotizing pancreatitis Admit to stepdown/inpatient. Keep n.p.o. Continue IV fluids. Continue analgesics as needed. Continue antiemetics as needed. Famotidine 20 mg IVP every 12 hours. Follow-up CBC, CMP and lipase. Consider starting IV antibiotics if febrile and/or WBC rises. Consider repeating CT abdomen/pelvis if no improvement.  Active Problems:   Alcohol abuse Given dysfunctional EtOH use, abstinence should be the goal. Start CIWA protocol. Magnesium, MVI, folate and thiamine supplementation. Consider behavioral health evaluation.    Leukocytosis May have a component of hemoconcentration. He denies fever or chills.  UA still pending. Clear lungs on exam.  Negative lower chest on CT. Defer antibiotics for now and monitor WBC.    Hypokalemia Replacing. Magnesium has been supplemented. Follow-up potassium level.    Essential hypertension Not on antihypertensives at home. Metoprolol 5 mg IVP x1 now. Metoprolol IV PRN while n.p.o. Monitor blood pressure and heart rate.    Tobacco abuse Nicotine replacement therapy as needed order. Staff to provide tobacco cessation information.    GERD (gastroesophageal reflux disease) Famotidine 20 mg IVP every 12 hours.    DVT prophylaxis: SCDs. Code Status: Full code. Family Communication: Disposition Plan: Admit for acute pancreatitis treatment/EtOH withdrawal  monitoring. Consults called: Admission status: Inpatient/stepdown.   Reubin Milan MD [facility]  07/25/2018, 12:09 AM   This document was prepared using Dragon voice recognition software and may contain some unintended transcription errors.

## 2018-07-25 NOTE — ED Notes (Signed)
Departure condition below documented on 07/25/2018 at 0158 was documented on wrong patient

## 2018-07-25 NOTE — ED Notes (Signed)
Pt is sleeping at this time.

## 2018-07-26 ENCOUNTER — Inpatient Hospital Stay (HOSPITAL_COMMUNITY): Payer: Self-pay

## 2018-07-26 DIAGNOSIS — D72829 Elevated white blood cell count, unspecified: Secondary | ICD-10-CM

## 2018-07-26 DIAGNOSIS — Z720 Tobacco use: Secondary | ICD-10-CM

## 2018-07-26 DIAGNOSIS — K219 Gastro-esophageal reflux disease without esophagitis: Secondary | ICD-10-CM

## 2018-07-26 LAB — GLUCOSE, CAPILLARY
Glucose-Capillary: 87 mg/dL (ref 70–99)
Glucose-Capillary: 89 mg/dL (ref 70–99)
Glucose-Capillary: 89 mg/dL (ref 70–99)

## 2018-07-26 LAB — LIPASE, BLOOD: Lipase: 186 U/L — ABNORMAL HIGH (ref 11–51)

## 2018-07-26 LAB — COMPREHENSIVE METABOLIC PANEL
ALT: 16 U/L (ref 0–44)
AST: 14 U/L — ABNORMAL LOW (ref 15–41)
Albumin: 3 g/dL — ABNORMAL LOW (ref 3.5–5.0)
Alkaline Phosphatase: 66 U/L (ref 38–126)
Anion gap: 6 (ref 5–15)
BUN: <5 mg/dL — ABNORMAL LOW (ref 6–20)
CO2: 24 mmol/L (ref 22–32)
Calcium: 8.3 mg/dL — ABNORMAL LOW (ref 8.9–10.3)
Chloride: 106 mmol/L (ref 98–111)
Creatinine, Ser: 0.7 mg/dL (ref 0.61–1.24)
GFR calc Af Amer: >60 mL/min (ref >60)
GFR calc non Af Amer: >60 mL/min (ref >60)
Glucose, Bld: 97 mg/dL (ref 70–99)
Potassium: 3.6 mmol/L (ref 3.5–5.1)
Sodium: 136 mmol/L (ref 135–145)
Total Bilirubin: 0.6 mg/dL (ref 0.3–1.2)
Total Protein: 6 g/dL — ABNORMAL LOW (ref 6.5–8.1)

## 2018-07-26 LAB — CBC WITH DIFFERENTIAL/PLATELET
Abs Immature Granulocytes: 0.13 10*3/uL — ABNORMAL HIGH (ref 0.00–0.07)
Basophils Absolute: 0.1 10*3/uL (ref 0.0–0.1)
Basophils Relative: 0 %
Eosinophils Absolute: 0.2 10*3/uL (ref 0.0–0.5)
Eosinophils Relative: 2 %
HCT: 37.1 % — ABNORMAL LOW (ref 39.0–52.0)
Hemoglobin: 11.3 g/dL — ABNORMAL LOW (ref 13.0–17.0)
Immature Granulocytes: 1 %
Lymphocytes Relative: 17 %
Lymphs Abs: 2.4 10*3/uL (ref 0.7–4.0)
MCH: 22.6 pg — ABNORMAL LOW (ref 26.0–34.0)
MCHC: 30.5 g/dL (ref 30.0–36.0)
MCV: 74.2 fL — ABNORMAL LOW (ref 80.0–100.0)
Monocytes Absolute: 1.4 10*3/uL — ABNORMAL HIGH (ref 0.1–1.0)
Monocytes Relative: 10 %
Neutro Abs: 9.9 10*3/uL — ABNORMAL HIGH (ref 1.7–7.7)
Neutrophils Relative %: 70 %
Platelets: 303 10*3/uL (ref 150–400)
RBC: 5 MIL/uL (ref 4.22–5.81)
RDW: 17.6 % — ABNORMAL HIGH (ref 11.5–15.5)
WBC: 14.1 10*3/uL — ABNORMAL HIGH (ref 4.0–10.5)
nRBC: 0 % (ref 0.0–0.2)

## 2018-07-26 LAB — MAGNESIUM: Magnesium: 1.9 mg/dL (ref 1.7–2.4)

## 2018-07-26 MED ORDER — IOPAMIDOL (ISOVUE-300) INJECTION 61%
100.0000 mL | Freq: Once | INTRAVENOUS | Status: AC | PRN
  Administered 2018-07-26: 100 mL via INTRAVENOUS

## 2018-07-26 MED ORDER — HYDROMORPHONE HCL 1 MG/ML IJ SOLN
0.5000 mg | INTRAMUSCULAR | Status: DC | PRN
  Administered 2018-07-26 (×3): 0.5 mg via INTRAVENOUS
  Filled 2018-07-26 (×3): qty 0.5

## 2018-07-26 MED ORDER — KETOROLAC TROMETHAMINE 30 MG/ML IJ SOLN
30.0000 mg | Freq: Four times a day (QID) | INTRAMUSCULAR | Status: DC
  Administered 2018-07-26 – 2018-07-27 (×3): 30 mg via INTRAVENOUS
  Filled 2018-07-26 (×3): qty 1

## 2018-07-26 MED ORDER — HYDROMORPHONE HCL 1 MG/ML IJ SOLN
0.5000 mg | INTRAMUSCULAR | Status: DC | PRN
  Administered 2018-07-26 – 2018-07-27 (×3): 0.5 mg via INTRAVENOUS
  Filled 2018-07-26 (×3): qty 0.5

## 2018-07-26 MED ORDER — PANCRELIPASE (LIP-PROT-AMYL) 12000-38000 UNITS PO CPEP
72000.0000 [IU] | ORAL_CAPSULE | Freq: Three times a day (TID) | ORAL | Status: DC
  Administered 2018-07-26 – 2018-07-27 (×2): 72000 [IU] via ORAL
  Filled 2018-07-26 (×3): qty 6

## 2018-07-26 MED ORDER — HYDROMORPHONE HCL 1 MG/ML IJ SOLN
0.5000 mg | Freq: Once | INTRAMUSCULAR | Status: AC
  Administered 2018-07-26: 0.5 mg via INTRAVENOUS
  Filled 2018-07-26: qty 0.5

## 2018-07-26 MED ORDER — PANCRELIPASE (LIP-PROT-AMYL) 12000-38000 UNITS PO CPEP
36000.0000 [IU] | ORAL_CAPSULE | ORAL | Status: DC
  Administered 2018-07-26: 36000 [IU] via ORAL

## 2018-07-26 MED ORDER — IOPAMIDOL (ISOVUE-300) INJECTION 61%
30.0000 mL | Freq: Once | INTRAVENOUS | Status: AC | PRN
  Administered 2018-07-26: 30 mL via ORAL

## 2018-07-26 NOTE — Progress Notes (Signed)
PROGRESS NOTE  David Miranda  YQM:[id number]  DOB: [birth year]  DOA: 07/24/2018 PCP: Patient, No Pcp Per   Brief Admission Hx: 33 y.o. male with medical history significant of pancreatitis after having, alcohol abuse, tobacco abuse who is coming to the emergency department with complaints of progressively worse abdominal pain after drinking alcohol.    MDM/Assessment & Plan:   1. Acute pancreatitis - continue supportive care, lipase trending down, start clear liquids, monitor for alcohol withdrawal.  2. Alcohol abuse / Marijuana abuse - Pt counseled and he is on CIWA protocol.  Follow.  3. Leukocytosis - secondary to acute pancreatitis - follow WBC.  4. Hypokalemia - repleted, follow.  5. Tobacco abuse - nicotine patch, ongoing counseling on cessation.  6. GERD - famotidine IV ordered.  7. Abdominal pain - IV pain and nausea meds ordered, reduce dilaudid today as lipase trending down and diet is able to be advanced.   DVT prophylaxis: SCDs Code Status: Full  Family Communication: patient updated at bedside  Disposition Plan: Inpatient for ongoing supportive therapy    Consultants:  N/a   Procedures:  N/a   Antimicrobials:  N/a    Subjective: Pt complains of persistent abdominal pain but wants to advance diet to clears today.  No emesis.    Objective: Vitals:   07/25/18 1754 07/25/18 2117 07/25/18 2123 07/26/18 0617  BP: (!) 160/90  (!) 140/95 117/81  Pulse:   93 84  Resp:   19 17  Temp:   99 F (37.2 C) 98 F (36.7 C)  TempSrc:   Oral Oral  SpO2:  94% 98%   Weight:      Height:        Intake/Output Summary (Last 24 hours) at 07/26/2018 1016 Last data filed at 07/25/2018 1700 Gross per 24 hour  Intake 0 ml  Output -  Net 0 ml   Filed Weights   07/24/18 1924  Weight: 79.4 kg   REVIEW OF SYSTEMS  As per history otherwise all reviewed and reported negative  Exam:  General exam: awake, alert, NAD, cooperative.   Respiratory system: Clear. No increased  work of breathing. Cardiovascular system: S1 & S2 heard. No JVD, murmurs, gallops, clicks or pedal edema. Gastrointestinal system: Abdomen is nondistended, soft and persistent epigastric tenderness, with guarding. Normal bowel sounds heard. Central nervous system: Alert and oriented. No focal neurological deficits. Extremities: no CCE.  Data Reviewed: Basic Metabolic Panel: Recent Labs  Lab 07/24/18 2028 07/25/18 0533 07/26/18 0648  NA 138 140 136  K 3.4* 4.0 3.6  CL 102 110 106  CO2 24 25 24   GLUCOSE 114* 95 97  BUN 7 9 <5*  CREATININE 0.95 0.93 0.70  CALCIUM 9.7 8.1* 8.3*  MG 1.9  --  1.9  PHOS 3.6  --   --    Liver Function Tests: Recent Labs  Lab 07/24/18 2028 07/25/18 0533 07/26/18 0648  AST 30 18 14*  ALT 31 20 16   ALKPHOS 73 55 66  BILITOT 0.4 0.6 0.6  PROT 8.5* 6.1* 6.0*  ALBUMIN 4.5 3.3* 3.0*   Recent Labs  Lab 07/24/18 2028 07/25/18 0533 07/26/18 0648  LIPASE 731* 1,076* 186*   No results for input(s): AMMONIA in the last 168 hours. CBC: Recent Labs  Lab 07/24/18 2028 07/25/18 0533 07/26/18 0648  WBC 16.1* 10.2 14.1*  NEUTROABS  --  5.7 9.9*  HGB 13.9 11.7* 11.3*  HCT 46.0 38.6* 37.1*  MCV 73.8* 75.8* 74.2*  PLT 443* 327 303  Cardiac Enzymes: No results for input(s): CKTOTAL, CKMB, CKMBINDEX, TROPONINI in the last 168 hours. CBG (last 3)  Recent Labs    07/25/18 1124 07/25/18 1631 07/26/18 0620  GLUCAP 79 90 89   No results found for this or any previous visit (from the past 240 hour(s)).   Studies: Ct Abdomen Pelvis W Contrast  Result Date: 07/24/2018 CLINICAL DATA:  Abdominal pain since last night. History of pancreatitis and alcohol abuse. EXAM: CT ABDOMEN AND PELVIS WITH CONTRAST TECHNIQUE: Multidetector CT imaging of the abdomen and pelvis was performed using the standard protocol following bolus administration of intravenous contrast. CONTRAST:  145mL OMNIPAQUE IOHEXOL 300 MG/ML  SOLN COMPARISON:  CT abdomen and pelvis  February 27, 2018 FINDINGS: LOWER CHEST: Lung bases are clear. Included heart size is normal. No pericardial effusion. HEPATOBILIARY: Focal fatty infiltration about the falciform ligament. Diffusely hypodense liver consistent with hepatic steatosis. Normal gallbladder. PANCREAS: Enlarged edematous pancreas with patchy areas of pancreatic head hypoenhancement. Peripancreatic fat stranding. No pseudocyst, ductal dilatation or calcifications. SPLEEN: Normal. ADRENALS/URINARY TRACT: Kidneys are orthotopic, demonstrating symmetric enhancement. No nephrolithiasis, hydronephrosis or solid renal masses. 12 mm cyst LEFT interpolar kidney. Too small to characterize hypodensities bilateral kidneys. The unopacified ureters are normal in course and caliber. Delayed imaging through the kidneys demonstrates symmetric prompt contrast excretion within the proximal urinary collecting system. Urinary bladder is partially distended and unremarkable. Normal adrenal glands. STOMACH/BOWEL: The stomach, small and large bowel are normal in course and caliber without inflammatory changes. Normal appendix. VASCULAR/LYMPHATIC: Aortoiliac vessels are normal in course and caliber. No lymphadenopathy by CT size criteria. REPRODUCTIVE: Normal. OTHER: Small Peri pancreatic/retropharyngeal effusion. MUSCULOSKELETAL: Nonacute.  Moderate L5-S1 degenerative disc. IMPRESSION: 1. Acute recurrent pancreatitis, potential early pancreatic head necrosis. Peripancreatic effusion without focal fluid collection. Electronically Signed   By: Elon Alas M.D.   On: 07/24/2018 22:30     Scheduled Meds: . thiamine injection  100 mg Intravenous Daily   Continuous Infusions: . 0.9 % NaCl with KCl 20 mEq / L 150 mL/hr at 07/26/18 0957  . famotidine (PEPCID) IV 20 mg (07/26/18 0956)    Principal Problem:   Acute necrotizing pancreatitis Active Problems:   Alcohol abuse   Leukocytosis   Hypokalemia   Essential hypertension   Tobacco abuse    Acute pancreatitis   GERD (gastroesophageal reflux disease)   Time spent:   Irwin Brakeman, MD [facility] 07/26/2018, 10:16 AM    LOS: 2 days  How to contact the [facility] Attending or Consulting provider [place] or covering provider during after hours [place], for this patient?  1. Check the care team in [facility] and look for a) attending/consulting [facility] provider listed and b) the [facility] team listed 2. Log into [email or web address] and use [facility]'s universal password to access. If you do not have the password, please contact the hospital operator. 3. Locate the [facility] provider you are looking for under [facility] and page to a number that you can be directly reached. 4. If you still have difficulty reaching the provider, please page the [facility] (Director on Call) for the Hospitalists listed on amion for assistance.

## 2018-07-26 NOTE — Progress Notes (Signed)
07/26/2018 5:07 PM  I spoke with RN.  Pt has been smoking in room, ambulating in room and has been tolerating diet but now claims severe abdominal pain asking for more dilaudid.  Added creon to meals and snacks.  STAT CT abdomen w/ contrast.  Added ketorolac IV scheduled.  Continue IV fluids.  This appears to be drug seeking behavior especially given recreational drug use and history of prior admissions but will exercise due diligence and further work up with repeat  CT abdomen.  His lipase is markedly improved today.  See new orders.    Murvin Natal MD

## 2018-07-27 DIAGNOSIS — E876 Hypokalemia: Secondary | ICD-10-CM

## 2018-07-27 LAB — CBC WITH DIFFERENTIAL/PLATELET
Abs Immature Granulocytes: 0.09 10*3/uL — ABNORMAL HIGH (ref 0.00–0.07)
Basophils Absolute: 0.1 10*3/uL (ref 0.0–0.1)
Basophils Relative: 1 %
Eosinophils Absolute: 0.3 10*3/uL (ref 0.0–0.5)
Eosinophils Relative: 3 %
HCT: 36.4 % — ABNORMAL LOW (ref 39.0–52.0)
Hemoglobin: 11 g/dL — ABNORMAL LOW (ref 13.0–17.0)
Immature Granulocytes: 1 %
Lymphocytes Relative: 26 %
Lymphs Abs: 2.1 10*3/uL (ref 0.7–4.0)
MCH: 22.9 pg — ABNORMAL LOW (ref 26.0–34.0)
MCHC: 30.2 g/dL (ref 30.0–36.0)
MCV: 75.8 fL — ABNORMAL LOW (ref 80.0–100.0)
Monocytes Absolute: 0.7 10*3/uL (ref 0.1–1.0)
Monocytes Relative: 9 %
Neutro Abs: 5 10*3/uL (ref 1.7–7.7)
Neutrophils Relative %: 60 %
Platelets: 327 10*3/uL (ref 150–400)
RBC: 4.8 MIL/uL (ref 4.22–5.81)
RDW: 17.9 % — ABNORMAL HIGH (ref 11.5–15.5)
WBC: 8.3 10*3/uL (ref 4.0–10.5)
nRBC: 0 % (ref 0.0–0.2)

## 2018-07-27 LAB — COMPREHENSIVE METABOLIC PANEL
ALT: 14 U/L (ref 0–44)
AST: 14 U/L — ABNORMAL LOW (ref 15–41)
Albumin: 3.1 g/dL — ABNORMAL LOW (ref 3.5–5.0)
Alkaline Phosphatase: 63 U/L (ref 38–126)
Anion gap: 9 (ref 5–15)
BUN: <5 mg/dL — ABNORMAL LOW (ref 6–20)
CO2: 23 mmol/L (ref 22–32)
Calcium: 8.8 mg/dL — ABNORMAL LOW (ref 8.9–10.3)
Chloride: 106 mmol/L (ref 98–111)
Creatinine, Ser: 0.73 mg/dL (ref 0.61–1.24)
GFR calc Af Amer: >60 mL/min (ref >60)
GFR calc non Af Amer: >60 mL/min (ref >60)
Glucose, Bld: 125 mg/dL — ABNORMAL HIGH (ref 70–99)
Potassium: 4 mmol/L (ref 3.5–5.1)
Sodium: 138 mmol/L (ref 135–145)
Total Bilirubin: 0.2 mg/dL — ABNORMAL LOW (ref 0.3–1.2)
Total Protein: 6.3 g/dL — ABNORMAL LOW (ref 6.5–8.1)

## 2018-07-27 LAB — MAGNESIUM: Magnesium: 2 mg/dL (ref 1.7–2.4)

## 2018-07-27 LAB — LIPASE, BLOOD: Lipase: 141 U/L — ABNORMAL HIGH (ref 11–51)

## 2018-07-27 MED ORDER — PANCRELIPASE (LIP-PROT-AMYL) 36000-114000 UNITS PO CPEP: 72000.0000 [IU] | ORAL_CAPSULE | Freq: Three times a day (TID) | ORAL | 0 refills | Status: DC

## 2018-07-27 MED ORDER — HYDROCODONE-ACETAMINOPHEN 5-325 MG PO TABS: 1.0000 | ORAL_TABLET | Freq: Four times a day (QID) | ORAL | 0 refills | Status: AC | PRN

## 2018-07-27 MED ORDER — OMEPRAZOLE 40 MG PO CPDR: 40.0000 mg | DELAYED_RELEASE_CAPSULE | Freq: Every day | ORAL | 1 refills | Status: DC

## 2018-07-27 MED ORDER — VITAMIN B-1 100 MG PO TABS: 100.0000 mg | ORAL_TABLET | Freq: Every day | ORAL | Status: DC

## 2018-07-27 MED ORDER — FOLIC ACID 1 MG PO TABS: 1.0000 mg | ORAL_TABLET | Freq: Every day | ORAL | 3 refills | Status: DC

## 2018-07-27 NOTE — Progress Notes (Signed)
Entered pt room to prepare to CT transfer. Pt ambulating in room with IV pump turned off but still connected. This nurse asked pt "How did the IV pump get off?" Pt responds "ummmmm, they turned it off about an hour ago." IV disconnected at this time and pt transported via wheelchair to CT.   Upon pt returning to room, pt proceeds to ambulate in hallway with street clothes on towards elevator. This nurse asked pt "Are you just walking in the hallways?" pt responds "I am just looking for my girlfriend." Pt soon proceeds to leave the unit but returns within minutes. Educated pt that he is unable to leave the unit.  Once pt returned to room and gets in the bed nurse is called to "give me my pain medication." will continue to monitor

## 2018-07-27 NOTE — Progress Notes (Signed)
Dc instructions given with med changes, foods to avoid, activity level and pain management. Encouraged to avoid alcohol use. IV discontinued. All questions answered. No distress noted. Ambulated off unit per Care Tech.

## 2018-07-27 NOTE — Discharge Instructions (Signed)
Pancreatitis Eating Plan Pancreatitis is when your pancreas becomes irritated and swollen (inflamed). The pancreas is a small organ located behind your stomach. It helps your body digest food and regulate your blood sugar. Pancreatitis can affect how your body digests food, especially foods with fat. You may also have other symptoms such as abdominal pain or nausea. When you have pancreatitis, following a low-fat eating plan may help you manage symptoms and recover more quickly. Work with your health care provider or a diet and nutrition specialist (dietitian) to create an eating plan that is right for you. What are tips for following this plan? Reading food labels Use the information on food labels to help keep track of how much fat you eat:  Check the serving size.  Look for the amount of total fat in grams (g) in one serving. ? Low-fat foods have 3 g of fat or less per serving. ? Fat-free foods have 0.5 g of fat or less per serving.  Keep track of how much fat you eat based on how many servings you eat. ? For example, if you eat two servings, the amount of fat you eat will be two times what is listed on the label. Shopping   Buy low-fat or nonfat foods, such as: ? Fresh, frozen, or canned fruits and vegetables. ? Grains, including pasta, bread, and rice. ? Lean meat, poultry, fish, and other protein foods. ? Low-fat or nonfat dairy.  Avoid buying bakery products and other sweets made with whole milk, butter, and eggs.  Avoid buying snack foods with added fat, such as anything with butter or cheese flavoring. Cooking  Remove skin from poultry, and remove extra fat from meat.  Limit the amount of fat and oil you use to 6 teaspoons or less per day.  Cook using low-fat methods, such as boiling, broiling, grilling, steaming, or baking.  Use spray oil to cook. Add fat-free chicken broth to add flavor and moisture.  Avoid adding cream to thicken soups or sauces. Use other thickeners  such as corn starch or tomato paste. Meal planning   Eat a low-fat diet as told by your dietitian. For most people, this means having no more than 55-65 grams of fat each day.  Eat small, frequent meals throughout the day. For example, you may have 5-6 small meals instead of 3 large meals.  Drink enough fluid to keep your urine pale yellow.  Do not drink alcohol. Talk to your health care provider if you need help stopping.  Limit how much caffeine you have, including black coffee, black and green tea, caffeinated soft drinks, and energy drinks. General information  Let your health care provider or dietitian know if you have unplanned weight loss on this eating plan.  You may be instructed to follow a clear liquid diet during a flare of symptoms. Talk with your health care provider about how to manage your diet during symptoms of a flare.  Take any vitamins or supplements as told by your health care provider.  Work with a Microbiologist, especially if you have other conditions such as obesity or diabetes mellitus. What foods should I avoid? Fruits Fried fruits. Fruits served with butter or cream. Vegetables Fried vegetables. Vegetables cooked with butter, cheese, or cream. Grains Biscuits, waffles, donuts, pastries, and croissants. Pies and cookies. Butter-flavored popcorn. Regular crackers. Meats and other protein foods Fatty cuts of meat. Poultry with skin. Organ meats. Bacon, sausage, and cold cuts. Whole eggs. Nuts and nut butters. Dairy Whole and  2% milk. Whole milk yogurt. Whole milk ice cream. Cream and half-and-half. Cream cheese. Sour cream. Cheese. Beverages Wine, beer, and liquor. The items listed above may not be a complete list of foods and beverages to avoid. Contact a dietitian for more information. Summary  Pancreatitis can affect how your body digests food, especially foods with fat.  When you have pancreatitis, it is recommended that you follow a low-fat eating  plan to help you recover more quickly and manage symptoms. For most people, this means limiting fat to no more than 55-65 grams per day.  Do not drink alcohol. Limit the amount of caffeine you have, and drink enough fluid to keep your urine pale yellow. This information is not intended to replace advice given to you by your health care provider. Make sure you discuss any questions you have with your health care provider. Document Released: 08/28/2017 Document Revised: 08/28/2017 Document Reviewed: 08/28/2017 Elsevier Interactive Patient Education  2019 [place] A soft-food eating plan includes foods that are safe and easy to chew and swallow. Your health care provider or dietitian can help you find foods and flavors that fit into this plan. Follow this plan until your health care provider or dietitian says it is safe to start eating other foods and food textures. What are tips for following this plan? General guidelines   Take small bites of food, or cut food into pieces about  inch or smaller. Bite-sized pieces of food are easier to chew and swallow.  Eat moist foods. Avoid overly dry foods.  Avoid foods that: ? Are difficult to swallow, such as dry, chunky, crispy, or sticky foods. ? Are difficult to chew, such as hard, tough, or stringy foods. ? Contain nuts, seeds, or fruits.  Follow instructions from your dietitian about the types of liquids that are safe for you to swallow. You may be allowed to have: ? Thick liquids only. This includes only liquids that are thicker than honey. ? Thin and thick liquids. This includes all beverages and foods that become liquid at room temperature.  To make thick liquids: ? Purchase a commercial liquid thickening powder. These are available at grocery stores and pharmacies. ? Mix the thickener into liquids according to instructions on the label. ? Purchase ready-made thickened liquids. ? Thicken soup by pureeing,  straining to remove chunks, and adding flour, potato flakes, or corn starch. ? Add commercial thickener to foods that become liquid at room temperature, such as milk shakes, yogurt, ice cream, gelatin, and sherbet.  Ask your health care provider whether you need to take a fiber supplement. Cooking  Cook meats so they stay tender and moist. Use methods like braising, stewing, or baking in liquid.  Cook vegetables and fruit until they are soft enough to be mashed with a fork.  Peel soft, fresh fruits such as peaches, nectarines, and melons.  When making soup, make sure chunks of meat and vegetables are smaller than  inch.  Reheat leftover foods slowly so that a tough crust does not form. What foods are allowed? The items listed below may not be a complete list. Talk with your dietitian about what dietary choices are best for you. Grains Breads, muffins, pancakes, or waffles moistened with syrup, jelly, or butter. Dry cereals well-moistened with milk. Moist, cooked cereals. Well-cooked pasta and rice. Vegetables All soft-cooked vegetables. Shredded lettuce. Fruits All canned and cooked fruits. Soft, peeled fresh fruits. Strawberries. Dairy Milk. Cream. Yogurt. Cottage cheese. Soft  cheese without the rind. Meats and other protein foods Tender, moist ground meat, poultry, or fish. Meat cooked in gravy or sauces. Eggs. Sweets and desserts Ice cream. Milk shakes. Sherbet. Pudding. Fats and oils Butter. Margarine. Olive, canola, sunflower, and grapeseed oil. Smooth salad dressing. Smooth cream cheese. Mayonnaise. Gravy. What foods are not allowed? The items listed bemay not be a complete list. Talk with your dietitian about what dietary choices are best for you. Grains Coarse or dry cereals, such as bran, granola, and shredded wheat. Tough or chewy crusty breads, such as [place] bread or baguettes. Breads with nuts, seeds, or fruit. Vegetables All raw vegetables. Cooked corn. Cooked  vegetables that are tough or stringy. Tough, crisp, fried potatoes and potato skins. Fruits Fresh fruits with skins or seeds, or both, such as apples, pears, and grapes. Stringy, high-pulp fruits, such as papaya, pineapple, coconut, and mango. Fruit leather and all dried fruit. Dairy Yogurt with nuts or coconut. Meats and other protein foods Hard, dry sausages. Dry meat, poultry, or fish. Meats with gristle. Fish with bones. Fried meat or fish. Lunch meat and hotdogs. Nuts and seeds. Chunky peanut butter or other nut butters. Sweets and desserts Cakes or cookies that are very dry or chewy. Desserts with dried fruit, nuts, or coconut. Fried pastries. Very rich pastries. Fats and oils Cream cheese with fruit or nuts. Salad dressings with seeds or chunks. Summary  A soft-food eating plan includes foods that are safe and easy to swallow. Generally, the foods should be soft enough to be mashed with a fork.  Avoid foods that are dry, hard to chew, crunchy, sticky, stringy, or crispy.  Ask your health care provider whether you need to thicken your liquids and if you need to take a fiber supplement. This information is not intended to replace advice given to you by your health care provider. Make sure you discuss any questions you have with your health care provider. Document Released: 08/29/2007 Document Revised: 07/25/2016 Document Reviewed: 07/25/2016 Elsevier Interactive Patient Education  2019 Else[employer] Inc[employer]   IMPORTANT INFORMATION: PAY CLOSE ATTENTION   PHYSICIAN DISCHARGE INSTRUCTIONS  Follow with Primary care provider  Patient, No Pcp Per  and other consultants as instructed your Hospitalist Physician  [place] IF SYMPTOMS COME BACK, WORSEN OR NEW PROBLEM DEVELOPS.   Please note: You were cared for by a hospitalist during your hospital stay. Every effort will be made to forward records to your primary care provider.  You can request that your  primary care provider send for your hospital records if they have not received them.  Once you are discharged, your primary care physician will handle any further medical issues. Please note that NO REFILLS for any discharge medications will be authorized once you are discharged, as it is imperative that you return to your primary care physician (or establish a relationship with a primary care physician if you do not have one) for your post hospital discharge needs so that they can reassess your need for medications and monitor your lab values.  Please get a complete blood count and chemistry panel checked by your Primary MD at your next visit, and again as instructed by your Primary MD.  Get Medicines reviewed and adjusted: Please take all your medications with you for your next visit with your Primary MD  Laboratory/radiological data: Please request your Primary MD to go over all hospital tests and procedure/radiological results at the follow up, please ask your primary  care provider to get all Hospital records sent to his/her office.  In some cases, they will be blood work, cultures and biopsy results pending at the time of your discharge. Please request that your primary care provider follow up on these results.  If you are diabetic, please bring your blood sugar readings with you to your follow up appointment with primary care.    Please call and make your follow up appointments as soon as possible.    Also Note the following: If you experience worsening of your admission symptoms, develop shortness of breath, life threatening emergency, suicidal or homicidal thoughts you must seek medical attention immediately by calling 911 or calling your MD immediately  if symptoms less severe.  You must read complete instructions/literature along with all the possible adverse reactions/side effects for all the Medicines you take and that have been prescribed to you. Take any new Medicines after you have  completely understood and accpet all the possible adverse reactions/side effects.   Do not drive when taking Pain medications or sleeping medications (Benzodiazepines)  Do not take more than prescribed Pain, Sleep and Anxiety Medications. It is not advisable to combine anxiety,sleep and pain medications without talking with your primary care practitioner  Special Instructions: If you have smoked or chewed Tobacco  in the last 2 yrs please stop smoking, stop any regular Alcohol  and or any Recreational drug use.  Wear Seat belts while driving.    Chronic Pancreatitis  Chronic pancreatitis is long-lasting inflammation and scarring of the pancreas. The pancreas is a gland that is located behind the stomach. It makes enzymes that help to digest food. The pancreas also releases hormones called glucagon and insulin, which help regulate blood sugar (glucose). Damage to the pancreas may affect digestion, cause pain in the upper abdomen and back, and cause diabetes. Inflammation can also irritate other organs in the abdomen near the pancreas. At first, pancreatitis may be sudden (acute). If you have several or prolonged episodes of acute pancreatitis, the condition can turn into chronic pancreatitis. What are the causes? The most common cause of this condition is alcohol abuse. Other causes include:  High (elevated) levels of triglycerides in the blood (hypertriglyceridemia).  Gallstones or other conditions that can block the tube that drains the pancreas (pancreatic duct).  Pancreatic cancer.  Cystic fibrosis.  Too much calcium in the blood (hypercalcemia), which may be caused by an overactive parathyroid gland (hyperparathyroidism).  Certain medicines.  Injury to the pancreas.  Infection.  Autoimmune pancreatitis. This is when the body's disease-fighting (immune) system attacks the pancreas.  Genes that are passed from parent to child (inherited). In some cases, the cause may not be  known. What increases the risk? This condition is more likely to develop in:  Men.  People who are 61-6 years old.  People who have a family history of pancreatitis.  People who smoke tobacco.  People who drink large amounts of alcohol over a long period of time. What are the signs or symptoms? Symptoms of this condition may include:  Pain in the abdomen or upper back. Pain may get worse after eating.  Nausea and vomiting.  Fever.  Weight loss.  A change in the color and consistency of bowel movements, such as stools that are oily, fatty, or clay-colored. How is this diagnosed? This condition is diagnosed based on your symptoms, your medical history, and a physical exam. You may have tests, such as:  Blood tests.  Stool samples.  Biopsy of  the pancreas. This is the removal of a small amount of pancreas tissue to be tested in a lab.  Imaging tests, such as: ? X-rays. ? CT scan. ? MRI. ? Ultrasound. How is this treated? You may need to be treated at a hospital. Treatment may involve:  Resting the pancreas. You may need to stop eating and drinking for a few days to give your pancreas time to recover. During this time, you will be given IV fluids to keep you hydrated.  Controlling pain. You may be given pain medicines by mouth (orally) or as injections.  Improving digestion. You may be given: ? Medicines to replace your pancreatic enzymes. ? Vitamin supplements. ? A specific diet to follow. You may work with a diet and nutrition specialist (dietitian) to make an eating plan.  Surgery to: ? Clear the pancreatic ducts of any blockages, such as gallstones. ? Remove any fluid or damaged tissue from the pancreas. Other treatments may include:  Preventing diabetes. Your health care provider may recommend that you: ? Get regular screening tests for diabetes. ? Monitor your blood glucose regularly.  Lifestyle changes, such as stopping alcohol use.  Steroid  medicines, if your condition is caused by your immune system attacking your body's own tissues (autoimmune disease). Follow these instructions at home: Eating and drinking      Do not drink alcohol. If you need help quitting, ask your health care provider.  Follow a diet as told by your health care provider or dietitian, if this applies. This may include: ? Limiting how much fat you eat. ? Eating smaller meals more often. ? Avoiding caffeine.  Drink enough fluid to keep your urine pale yellow. General instructions  Take over-the-counter and prescription medicines only as told by your health care provider. These include vitamin supplements.  Do not drive or use heavy machinery while taking prescription pain medicine.  If you are taking prescription pain medicine, take actions to prevent or treat constipation. Your health care provider may recommend that you: ? Take an over-the-counter or prescription medicine for constipation. ? Eat foods that are high in fiber such as whole grains and beans. ? Limit foods that are high in fat and processed sugars, such as fried or sweet foods.  Do not use any products that contain nicotine or tobacco, such as cigarettes and e-cigarettes. If you need help quitting, ask your health care provider.  If recommended by your health care provider, monitor your blood glucose at home.  Keep all follow-up visits as told by your health care provider. This is important. Contact a health care provider if:  You have pain that does not get better with medicine.  You have a fever.  You have sudden weight loss. Get help right away if:  Your pain suddenly gets worse.  You have sudden swelling in your abdomen.  You start to vomit often.  You vomit blood.  You have diarrhea that does not go away.  You have blood in your stool.  You become confused or you have trouble thinking clearly. Summary  Chronic pancreatitis is long-lasting inflammation and  scarring of the pancreas. Damage to the pancreas may affect digestion, cause pain in the upper abdomen and back, and cause diabetes. Inflammation can also irritate other organs in the abdomen near the pancreas.  Common causes of this condition are alcohol abuse, gallstones, high (elevated) levels of triglycerides, and certain medicines.  This condition is sometimes treated at a hospital and may involve resting the pancreas,  controlling pain, replacing enzymes, and avoiding alcohol. This information is not intended to replace advice given to you by your health care provider. Make sure you discuss any questions you have with your health care provider. Document Released: 06/18/2015 Document Revised: 01/19/2017 Document Reviewed: 01/19/2017 Elsevier Interactive Patient Education  2019 Else[employer] Inc.  Acute Pancreatitis  Acute pancreatitis happens when the pancreas gets swollen. The pancreas is a large gland behind the stomach. The pancreas helps control blood sugar. It also makes enzymes that help digest food. This condition happens when the enzymes attack the pancreas and damage it. Most attacks last a couple of days and are dangerous. The lungs, heart, and kidneys may stop working. What are the causes?  Alcohol abuse.  Drug abuse.  Gallstones.  Some medicines.  Some chemicals.  Infection.  Damage caused by an accident.Beulah Gandy (abdominal) surgery.  In some cases, the cause is not known. What are the signs or symptoms?  Pain in the upper belly and back.  Swelling of the belly  Feeling sick to your stomach (nausea) and throwing up (vomiting). How is this treated?  You will probably have to stay in the hospital. ? Treatment may include:  Fluid through an IV.  A tube to remove stomach contents and stop you from throwing up.  Not eating for 3-4 days.  Pain medicine.  Antibiotic medicines if you have an infection.  Surgery on the pancreas or gallbladder. Follow these  instructions at home: Eating and drinking   Follow instructions from your doctor about diet.  Eat small meals often. Avoid eating big meals.  Eat foods that do not have a lot of fat in them.  Drink enough fluid to keep your pee (urine) pale yellow.  Do not drink alcohol if it caused your condition. General instructions  Take over-the-counter and prescription medicines only as told by your doctor.  Do not use cigarettes, e-cigarettes, and chewing tobacco. If you need help quitting, ask your doctor.  Get plenty of rest.  If directed, check your blood sugar at home as told by your doctor.  Keep all follow-up visits as told by your doctor. This is important. Contact a doctor if:  You do not get better as quickly as expected.  You have new symptoms.  Your symptoms get worse.  You have lasting pain or weakness.  You continue to feel sick to your stomach.  You get better and then you have another pain attack.  You have a fever. Get help right away if:  You cannot eat or keep fluids down.  Your pain becomes very bad.  Your skin or the white part of your eyes turns yellow.  You throw up.  You feel dizzy or you pass out.  Your blood sugar is high (over 300 mg/dL). Summary  Acute pancreatitis happens when the pancreas gets swollen.  This condition is usually caused by alcohol abuse, drug abuse, or gallstones.  You will probably have to stay in the hospital for treatment. This information is not intended to replace advice given to you by your health care provider. Make sure you discuss any questions you have with your health care provider. Document Released: 11/08/2007 Document Revised: 09/25/2016 Document Reviewed: 02/23/2015 Elsevier Interactive Patient Education  2019 [employer].

## 2018-07-27 NOTE — Care Management Note (Signed)
Case Management Note  Patient Details  Name: Gibbs Naugle MRN: [id number] Date of Birth: 1985-10-01  Subjective/Objective:   RNCM consulted to assist with [place] medication. Patient without insurance. Medication at full price ranges from $400-$900. Patient follows at [facility]. [facility] card faxed to [facility]. Unfortunately, patient has already been registered for [facility] and is not a candidate. Voicemail left to patients phone number listed on demographic to see about other options. Unfortunately, I am not sure of what else is available in that area for cheap/low cost medications.                  Action/Plan:   Expected Discharge Date:  07/27/18               Expected Discharge Plan:     In-House Referral:     Discharge planning Services     Post Acute Care Choice:    Choice offered to:     DME Arranged:    DME Agency:     HH Arranged:    HH Agency:     Status of Service:     If discussed at [employer], dates discussed:    Additional Comments:  Latanya Maudlin, RN 07/27/2018, 2:54 PM

## 2018-07-27 NOTE — Progress Notes (Signed)
Pt requesting dilaudid. Entered pt room and the smell of cigarette smoke and perfume is in the room. Noted that IVF are not infusing and tape that had tubing looped is removed. Asked pt how the IVF were off. Pt responds "I turned it off because it kept beeping and the tape got unsticky." However nurse is charting outside of pt room and IV pump did not alert.   Educated pt that smoking is not tolerated in his hospital room or hospital grounds. Pt states "I have not been smoking." nurse then asks pt "Where did your nicotine patch go?" Pt states "well, my dad brought me some dip earlier and I was also trying to go outside to smoke earlier but they wouldn't let me."  Educated pt nicotine patch was offered to help with cravings. Also explained to pt that if he is more interested in smoking his abdomen pain is secondary. Educated pt that after this dose of dilaudid we would attempt to manage his pain with scheduled ketorolac. Pt understanding at this time.

## 2018-07-27 NOTE — Discharge Summary (Signed)
Physician Discharge Summary  David Miranda PJA:[id number] DOB: February 26, 1986 DOA: 07/24/2018  PCP: [place] [facility] GI: Dr. Oneida Miranda   Admit date: 07/24/2018 Discharge date: 07/27/2018  Admitted From:  Home  Disposition: Home   Recommendations for Outpatient Follow-up:  1. Follow up with PCP in 1 weeks 2. Follow up with GI in 1-2 weeks, go to GI clinic to request samples for Creon  Discharge Condition: STABLE   CODE STATUS: FULL    PT IS HIGH RISK FOR READMISSION DUE TO NONCOMPLIANCE AND ONGOING RECREATIONAL SUBSTANCE ABUSE.   Brief Hospitalization Summary: Please see all hospital notes, images, labs for full details of the hospitalization.  Dr. Lavetta Nielsen HPI: David Miranda is a 33 y.o. male with medical history significant of pancreatitis after having, alcohol abuse, tobacco abuse who is coming to the emergency department with complaints of progressively worse abdominal pain associated with an episode of emesis and nausea since last night after he had 3 glasses of wine and about 25 ounces of beer yesterday evening during his mother's birthday.  He states that he had not had any alcohol consumption in the last 3 months.  He denies fever, chills, diarrhea, hematemesis, melena or hematochezia.  No sore throat, rhinorrhea, hemoptysis, wheezing, chest pain, palpitations, diaphoresis, PND, orthopnea or pitting edema of the lower extremities.  He denies dysuria, frequency or hematuria.  No polyuria, polydipsia, polyphagia or blurred vision.  He denies skin rashes or pruritus.  ED Course: Initial vital signs temperature 36.6 F, pulse 100, respiration 18, blood pressure 145/93 mmHg and O2 sat 100% on room air.  Patient received a 1000 mL NS bolus, Zofran 4 mg IVP x2 and hydromorphone 1 mg IVP x2.  I added Toradol 30 mg IVP x1.  His white count was 16.1, hemoglobin 13.9 g/dL and platelets 443.  Lipase was 731 units/L.  CMP shows a potassium of 3.4 mmol/L.  Glucose of 114 mg/dL and total  protein of 8.5 g/dL.  All other values, including calcium level, are within normal limits.  Ethanol was 106, magnesium 1.9 and phosphorus 3.6 mg/dL.  Imaging: CT abdomen/pelvis shows acute recurrent pancreatitis, potential early pancreatic head necrosis.  Peripancreatic effusion without focal fluid collection.  Brief Admission Hx: 33 y.o.malewith medical history significant ofpancreatitisafter having, alcohol abuse, tobacco abuse who is coming to the emergency department with complaints of progressively worse abdominal pain after drinking alcohol.    MDM/Assessment & Plan:   1. Acute pancreatitis - continue supportive care, lipase continues to trend down, Pt is tolerating diet. He is not cooperating with treatment plan.  He has removed his IV and has been caught smoking in the room and chewing tobacco and many manipulative behaviors like trying to leave the floor with girlfriend.  Because of this, he is not participating in his care and will discharge him home today.  Outpatient follow up with David Miranda and with [facility] recommended.  I asked the care manager to assist with Creon.  He has not been taking it consistently. 2. Alcohol abuse / Marijuana abuse - Pt counseled to stop and was placed on the CIWA protocol.  3. Leukocytosis - RESOLVED.  secondary to acute pancreatitis - follow WBC.  4. Hypokalemia - repleted.  5. Tobacco abuse - nicotine patch, ongoing counseling on cessation.  6. GERD - famotidine IV ordered. Resume home omeprazole.   DVT prophylaxis: SCDs Code Status: Full  Family Communication: patient updated at bedside  Disposition Plan: Inpatient for ongoing supportive therapy   Consultants:  N/a   Procedures:  N/a   Antimicrobials:  N/a   Discharge Diagnoses:  Principal Problem:   Acute necrotizing pancreatitis Active Problems:   Alcohol abuse   Leukocytosis   Hypokalemia   Essential hypertension   Tobacco abuse   Acute  pancreatitis   GERD (gastroesophageal reflux disease)   Discharge Instructions: Discharge Instructions    Call MD for:  difficulty breathing, headache or visual disturbances   Complete by:  As directed    Call MD for:  persistant dizziness or light-headedness   Complete by:  As directed    Call MD for:  persistant nausea and vomiting   Complete by:  As directed    Call MD for:  severe uncontrolled pain   Complete by:  As directed    Diet - low sodium heart healthy   Complete by:  As directed    Increase activity slowly   Complete by:  As directed      Allergies as of 07/27/2018   No Known Allergies     Medication List    STOP taking these medications   Potassium 99 MG Tabs     TAKE these medications   folic acid 1 MG tablet Commonly known as:  FOLVITE Take 1 tablet (1 mg total) by mouth daily.   HYDROcodone-acetaminophen 5-325 MG tablet Commonly known as:  NORCO/VICODIN Take 1 tablet by mouth every 6 (six) hours as needed for up to 3 days for severe pain. What changed:    when to take this  reasons to take this   lipase/protease/amylase 36000 UNITS Cpep capsule Commonly known as:  CREON Take 2 capsules (72,000 Units total) by mouth 3 (three) times daily with meals.   omeprazole 40 MG capsule Commonly known as:  PRILOSEC Take 1 capsule (40 mg total) by mouth daily.   thiamine 100 MG tablet Commonly known as:  VITAMIN B-1 Take 1 tablet (100 mg total) by mouth daily.      Follow-up Information    Fields, Marga Melnick, MD. Schedule an appointment as soon as possible for a visit in 2 week(s).   Specialty:  Gastroenterology Why:  Hospital Follow Up for pancreatitis Contact information: David Miranda [id number] [phone]        Health, David Miranda. Schedule an appointment as soon as possible for a visit in 1 week(s).   Why:  Hospital Follow Up  Contact information: [address] [place] [address] [phone]           No Known Allergies Allergies as of 07/27/2018   No Known Allergies     Medication List    STOP taking these medications   Potassium 99 MG Tabs     TAKE these medications   folic acid 1 MG tablet Commonly known as:  FOLVITE Take 1 tablet (1 mg total) by mouth daily.   HYDROcodone-acetaminophen 5-325 MG tablet Commonly known as:  NORCO/VICODIN Take 1 tablet by mouth every 6 (six) hours as needed for up to 3 days for severe pain. What changed:    when to take this  reasons to take this   lipase/protease/amylase 36000 UNITS Cpep capsule Commonly known as:  CREON Take 2 capsules (72,000 Units total) by mouth 3 (three) times daily with meals.   omeprazole 40 MG capsule Commonly known as:  PRILOSEC Take 1 capsule (40 mg total) by mouth daily.   thiamine 100 MG tablet Commonly known as:  VITAMIN B-1 Take 1 tablet (100 mg  total) by mouth daily.       Procedures/Studies: Ct Abdomen W Contrast  Result Date: 07/26/2018 CLINICAL DATA:  Bilateral flank pain that radiates across the lower abdomen. Appendicitis on CT scan 2 days ago. EXAM: CT ABDOMEN WITH CONTRAST TECHNIQUE: Multidetector CT imaging of the abdomen was performed using the standard protocol following bolus administration of intravenous contrast. CONTRAST:  63mL ISOVUE-300 IOPAMIDOL (ISOVUE-300) INJECTION 61%, 131mL ISOVUE-300 IOPAMIDOL (ISOVUE-300) INJECTION 61% COMPARISON:  07/24/2018 FINDINGS: Lower chest: Mild circumferential wall thickening noted distal esophagus Hepatobiliary: No suspicious focal abnormality within the liver parenchyma. Focal low attenuation in the medial segment left liver at the falciform ligament is in a characteristic location for focal fatty deposition. High attenuation material in the lumen of the gallbladder probably related to vicarious excretion of contrast material. No intrahepatic or extrahepatic biliary dilation. Pancreas: There is mild peripancreatic edema/inflammation, but decreased  when compared to 07/24/2018. Small 2.7 x 1.5 cm fluid collection identified just anterior to the body of pancreas without substantial rim enhancement. Relationship of the pancreatic head to the duodenum appears abnormal with pancreatic parenchyma projecting posterior to the duodenum on images 35 through 37 of image 2. On image 35, the pancreatic parenchyma appears to nearly entirely encase the descending duodenum. 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 No dilatation of the main pancreatic duct. Pancreatic parenchyma enhances throughout with no features to suggest necrosis. Spleen: No splenomegaly. No focal mass lesion. Adrenals/Urinary Tract: No adrenal nodule or mass. Right kidney unremarkable. 15 mm interpolar left renal cyst again noted. Stomach/Bowel: Stomach is distended with contrast material. No small bowel or colonic dilatation within the visualized abdomen. Vascular/Lymphatic: No abdominal aortic aneurysm. Other: No intraperitoneal free fluid. Musculoskeletal: No worrisome lytic or sclerotic osseous abnormality. IMPRESSION: 1. Peripancreatic edema/inflammation is decreased in the 2 days since prior CT scan. There is a small fluid collection anterior to the body of pancreas on today's study without rim enhancement to suggest organized pseudocyst or abscess. No evidence of pancreatic necrosis. 2. Morphology of the pancreatic head and the relationship pancreatic head parenchyma to the duodenum suggests annular pancreas. Dedicated pancreatic MRI after resolution of acute symptoms may prove helpful to further evaluate. Electronically Signed   By: Misty Stanley M.D.   On: 07/26/2018 21:09   Ct Abdomen Pelvis W Contrast  Result Date: 07/24/2018 CLINICAL DATA:  Abdominal pain since last night. History of pancreatitis and alcohol abuse. EXAM: CT ABDOMEN AND PELVIS WITH CONTRAST TECHNIQUE: Multidetector CT imaging of the abdomen and pelvis was performed using the standard protocol following bolus  administration of intravenous contrast. CONTRAST:  173mL OMNIPAQUE IOHEXOL 300 MG/ML  SOLN COMPARISON:  CT abdomen and pelvis February 27, 2018 FINDINGS: LOWER CHEST: Lung bases are clear. Included heart size is normal. No pericardial effusion. HEPATOBILIARY: Focal fatty infiltration about the falciform ligament. Diffusely hypodense liver consistent with hepatic steatosis. Normal gallbladder. PANCREAS: Enlarged edematous pancreas with patchy areas of pancreatic head hypoenhancement. Peripancreatic fat stranding. No pseudocyst, ductal dilatation or calcifications. SPLEEN: Normal. ADRENALS/URINARY TRACT: Kidneys are orthotopic, demonstrating symmetric enhancement. No nephrolithiasis, hydronephrosis or solid renal masses. 12 mm cyst LEFT interpolar kidney. Too small to characterize hypodensities bilateral kidneys. The unopacified ureters are normal in course and caliber. Delayed imaging through the kidneys demonstrates symmetric prompt contrast excretion within the proximal urinary collecting system. Urinary bladder is partially distended and unremarkable. Normal adrenal glands. STOMACH/BOWEL: The stomach, small and large bowel are normal in course and caliber without inflammatory changes. Normal appendix. VASCULAR/LYMPHATIC: Aortoiliac vessels are normal  in course and caliber. No lymphadenopathy by CT size criteria. REPRODUCTIVE: Normal. OTHER: Small Peri pancreatic/retropharyngeal effusion. MUSCULOSKELETAL: Nonacute.  Moderate L5-S1 degenerative disc. IMPRESSION: 1. Acute recurrent pancreatitis, potential early pancreatic head necrosis. Peripancreatic effusion without focal fluid collection. Electronically Signed   By: Elon Alas M.D.   On: 07/24/2018 22:30      Subjective: Pt tolerating diet and ambulating in room, he has removed his IV, he is in no distress at all.   Discharge Exam: Vitals:   07/27/18 0617 07/27/18 0620  BP: 116/75 116/75  Pulse: 63 63  Resp: 16 18  Temp: 98 F (36.7 C) 98 F  (36.7 C)  SpO2:  98%   Vitals:   07/26/18 0617 07/26/18 2108 07/27/18 0617 07/27/18 0620  BP: 117/81 (!) 130/91 116/75 116/75  Pulse: 84 70 63 63  Resp: 17 17 16 18   Temp: 98 F (36.7 C) 98 F (36.7 C) 98 F (36.7 C) 98 F (36.7 C)  TempSrc: Oral Oral Oral Oral  SpO2:  100%  98%  Weight:      Height:       General exam: awake, alert, NAD, cooperative.   Respiratory system: Clear. No increased work of breathing. Cardiovascular system: S1 & S2 heard. No JVD, murmurs, gallops, clicks or pedal edema. Gastrointestinal system: Abdomen is nondistended, soft mild epigastric tenderness, no guarding.  Normal bowel sounds heard. Central nervous system: Alert and oriented. No focal neurological deficits. Extremities: no CCE.  The results of significant diagnostics from this hospitalization (including imaging, microbiology, ancillary and laboratory) are listed below for reference.     Microbiology: No results found for this or any previous visit (from the past 240 hour(s)).   Labs: BNP (last 3 results) No results for input(s): BNP in the last 8760 hours. Basic Metabolic Panel: Recent Labs  Lab 07/24/18 2028 07/25/18 0533 07/26/18 0648 07/27/18 0604  NA 138 140 136 138  K 3.4* 4.0 3.6 4.0  CL 102 110 106 106  CO2 24 25 24 23   GLUCOSE 114* 95 97 125*  BUN 7 9 <5* <5*  CREATININE 0.95 0.93 0.70 0.73  CALCIUM 9.7 8.1* 8.3* 8.8*  MG 1.9  --  1.9 2.0  PHOS 3.6  --   --   --    Liver Function Tests: Recent Labs  Lab 07/24/18 2028 07/25/18 0533 07/26/18 0648 07/27/18 0604  AST 30 18 14* 14*  ALT 31 20 16 14   ALKPHOS 73 55 66 63  BILITOT 0.4 0.6 0.6 0.2*  PROT 8.5* 6.1* 6.0* 6.3*  ALBUMIN 4.5 3.3* 3.0* 3.1*   Recent Labs  Lab 07/24/18 2028 07/25/18 0533 07/26/18 0648 07/27/18 0604  LIPASE 731* 1,076* 186* 141*   No results for input(s): AMMONIA in the last 168 hours. CBC: Recent Labs  Lab 07/24/18 2028 07/25/18 0533 07/26/18 0648 07/27/18 0604  WBC 16.1*  10.2 14.1* 8.3  NEUTROABS  --  5.7 9.9* 5.0  HGB 13.9 11.7* 11.3* 11.0*  HCT 46.0 38.6* 37.1* 36.4*  MCV 73.8* 75.8* 74.2* 75.8*  PLT 443* 327 303 327   Cardiac Enzymes: No results for input(s): CKTOTAL, CKMB, CKMBINDEX, TROPONINI in the last 168 hours. BNP: Invalid input(s): POCBNP CBG: Recent Labs  Lab 07/25/18 1124 07/25/18 1631 07/26/18 0620 07/26/18 1119 07/26/18 2350  GLUCAP 79 90 89 89 87   D-Dimer No results for input(s): DDIMER in the last 72 hours. Hgb A1c No results for input(s): HGBA1C in the last 72 hours. Lipid Profile No results  for input(s): CHOL, HDL, LDLCALC, TRIG, CHOLHDL, LDLDIRECT in the last 72 hours. Thyroid function studies No results for input(s): TSH, T4TOTAL, T3FREE, THYROIDAB in the last 72 hours.  Invalid input(s): FREET3 Anemia work up No results for input(s): VITAMINB12, FOLATE, FERRITIN, TIBC, IRON, RETICCTPCT in the last 72 hours. Urinalysis    Component Value Date/Time   COLORURINE YELLOW 07/25/2018 0625   APPEARANCEUR CLEAR 07/25/2018 0625   LABSPEC >1.046 (H) 07/25/2018 0625   PHURINE 5.0 07/25/2018 0625   GLUCOSEU NEGATIVE 07/25/2018 0625   HGBUR NEGATIVE 07/25/2018 0625   BILIRUBINUR NEGATIVE 07/25/2018 0625   KETONESUR NEGATIVE 07/25/2018 0625   PROTEINUR NEGATIVE 07/25/2018 0625   NITRITE NEGATIVE 07/25/2018 0625   LEUKOCYTESUR NEGATIVE 07/25/2018 0625   Sepsis Labs Invalid input(s): PROCALCITONIN,  WBC,  LACTICIDVEN Microbiology No results found for this or any previous visit (from the past 240 hour(s)).  Time coordinating discharge: 32 minutes   SIGNED:  Irwin Brakeman, MD  [facility] 07/27/2018, 11:12 AM How to contact the [facility] Attending or Consulting provider [place] or covering provider during after hours [place], for this patient?  1. Check the care team in [facility] and look for a) attending/consulting [facility] provider listed and b) the [facility] team listed 2. Log into [email or web address] and use [place]'s universal  password to access. If you do not have the password, please contact the hospital operator. 3. Locate the [facility] provider you are looking for under [facility] and page to a number that you can be directly reached. 4. If you still have difficulty reaching the provider, please page the [facility] (Director on Call) for the Hospitalists listed on amion for assistance.

## 2018-07-29 ENCOUNTER — Other Ambulatory Visit: Payer: Self-pay

## 2018-07-29 ENCOUNTER — Observation Stay
Admission: EM | Admit: 2018-07-29 | Discharge: 2018-07-30 | Discharge status: Against Medical Advice | Payer: Self-pay | Attending: Internal Medicine | Admitting: Internal Medicine

## 2018-07-29 DIAGNOSIS — F1010 Alcohol abuse, uncomplicated: Secondary | ICD-10-CM | POA: Insufficient documentation

## 2018-07-29 DIAGNOSIS — R1013 Epigastric pain: Secondary | ICD-10-CM

## 2018-07-29 DIAGNOSIS — K861 Other chronic pancreatitis: Secondary | ICD-10-CM

## 2018-07-29 DIAGNOSIS — F17210 Nicotine dependence, cigarettes, uncomplicated: Secondary | ICD-10-CM | POA: Insufficient documentation

## 2018-07-29 DIAGNOSIS — Z79899 Other long term (current) drug therapy: Secondary | ICD-10-CM | POA: Insufficient documentation

## 2018-07-29 DIAGNOSIS — I10 Essential (primary) hypertension: Secondary | ICD-10-CM | POA: Insufficient documentation

## 2018-07-29 DIAGNOSIS — K859 Acute pancreatitis without necrosis or infection, unspecified: Principal | ICD-10-CM | POA: Insufficient documentation

## 2018-07-29 DIAGNOSIS — K219 Gastro-esophageal reflux disease without esophagitis: Secondary | ICD-10-CM | POA: Insufficient documentation

## 2018-07-29 LAB — URINALYSIS, COMPLETE (UACMP) WITH MICROSCOPIC
Bacteria, UA: NONE SEEN
Bilirubin Urine: NEGATIVE
Glucose, UA: NEGATIVE mg/dL
Hgb urine dipstick: NEGATIVE
Ketones, ur: NEGATIVE mg/dL
Leukocytes,Ua: NEGATIVE
Nitrite: NEGATIVE
Protein, ur: NEGATIVE mg/dL
Specific Gravity, Urine: 1.016 (ref 1.005–1.030)
Squamous Epithelial / HPF: NONE SEEN (ref 0–5)
pH: 7 (ref 5.0–8.0)

## 2018-07-29 LAB — COMPREHENSIVE METABOLIC PANEL
ALT: 18 U/L (ref 0–44)
AST: 20 U/L (ref 15–41)
Albumin: 4.3 g/dL (ref 3.5–5.0)
Alkaline Phosphatase: 72 U/L (ref 38–126)
Anion gap: 12 (ref 5–15)
BUN: 5 mg/dL — ABNORMAL LOW (ref 6–20)
CO2: 23 mmol/L (ref 22–32)
Calcium: 10.2 mg/dL (ref 8.9–10.3)
Chloride: 104 mmol/L (ref 98–111)
Creatinine, Ser: 0.77 mg/dL (ref 0.61–1.24)
GFR calc Af Amer: >60 mL/min (ref >60)
GFR calc non Af Amer: >60 mL/min (ref >60)
Glucose, Bld: 106 mg/dL — ABNORMAL HIGH (ref 70–99)
Potassium: 3.9 mmol/L (ref 3.5–5.1)
Sodium: 139 mmol/L (ref 135–145)
Total Bilirubin: 0.6 mg/dL (ref 0.3–1.2)
Total Protein: 8.5 g/dL — ABNORMAL HIGH (ref 6.5–8.1)

## 2018-07-29 LAB — CBC
HCT: 43.7 % (ref 39.0–52.0)
Hemoglobin: 13.6 g/dL (ref 13.0–17.0)
MCH: 22.5 pg — ABNORMAL LOW (ref 26.0–34.0)
MCHC: 31.1 g/dL (ref 30.0–36.0)
MCV: 72.4 fL — ABNORMAL LOW (ref 80.0–100.0)
Platelets: 566 10*3/uL — ABNORMAL HIGH (ref 150–400)
RBC: 6.04 MIL/uL — ABNORMAL HIGH (ref 4.22–5.81)
RDW: 18.7 % — ABNORMAL HIGH (ref 11.5–15.5)
WBC: 10.6 10*3/uL — ABNORMAL HIGH (ref 4.0–10.5)
nRBC: 0 % (ref 0.0–0.2)

## 2018-07-29 LAB — LIPASE, BLOOD: Lipase: 116 U/L — ABNORMAL HIGH (ref 11–51)

## 2018-07-29 MED ORDER — ACETAMINOPHEN 650 MG RE SUPP: 650.0000 mg | Freq: Four times a day (QID) | RECTAL | Status: DC | PRN

## 2018-07-29 MED ORDER — SODIUM CHLORIDE 0.9% FLUSH
3.0000 mL | Freq: Once | INTRAVENOUS | Status: AC
  Administered 2018-07-29: 3 mL via INTRAVENOUS

## 2018-07-29 MED ORDER — ACETAMINOPHEN 325 MG PO TABS: 650.0000 mg | ORAL_TABLET | Freq: Four times a day (QID) | ORAL | Status: DC | PRN

## 2018-07-29 MED ORDER — SODIUM CHLORIDE 0.9 % IV BOLUS
1000.0000 mL | Freq: Once | INTRAVENOUS | Status: AC
  Administered 2018-07-29: 1000 mL via INTRAVENOUS

## 2018-07-29 MED ORDER — ENOXAPARIN SODIUM 40 MG/0.4ML ~~LOC~~ SOLN: 40.0000 mg | SUBCUTANEOUS | Status: DC

## 2018-07-29 MED ORDER — KETOROLAC TROMETHAMINE 30 MG/ML IJ SOLN
15.0000 mg | Freq: Once | INTRAMUSCULAR | Status: AC
  Administered 2018-07-29: 15 mg via INTRAVENOUS
  Filled 2018-07-29: qty 1

## 2018-07-29 MED ORDER — PROMETHAZINE HCL 25 MG/ML IJ SOLN
12.5000 mg | Freq: Four times a day (QID) | INTRAMUSCULAR | Status: DC | PRN
  Filled 2018-07-29: qty 1

## 2018-07-29 MED ORDER — PROMETHAZINE HCL 25 MG/ML IJ SOLN
25.0000 mg | Freq: Once | INTRAMUSCULAR | Status: AC
  Administered 2018-07-29: 25 mg via INTRAVENOUS

## 2018-07-29 MED ORDER — SODIUM CHLORIDE 0.9 % IV SOLN
INTRAVENOUS | Status: DC
  Administered 2018-07-29 – 2018-07-30 (×2): via INTRAVENOUS

## 2018-07-29 MED ORDER — ONDANSETRON HCL 4 MG PO TABS: 4.0000 mg | ORAL_TABLET | Freq: Four times a day (QID) | ORAL | Status: DC | PRN

## 2018-07-29 MED ORDER — MORPHINE SULFATE (PF) 4 MG/ML IV SOLN
4.0000 mg | INTRAVENOUS | Status: DC | PRN
  Administered 2018-07-29 (×2): 4 mg via INTRAVENOUS
  Filled 2018-07-29 (×2): qty 1

## 2018-07-29 MED ORDER — OXYCODONE HCL 5 MG PO TABS
5.0000 mg | ORAL_TABLET | ORAL | Status: DC | PRN
  Administered 2018-07-30 (×2): 5 mg via ORAL
  Filled 2018-07-29 (×2): qty 1

## 2018-07-29 MED ORDER — ONDANSETRON HCL 4 MG/2ML IJ SOLN: 4.0000 mg | Freq: Four times a day (QID) | INTRAMUSCULAR | Status: DC | PRN

## 2018-07-29 MED ORDER — HYDROMORPHONE HCL 1 MG/ML IJ SOLN
0.5000 mg | INTRAMUSCULAR | Status: DC | PRN
  Administered 2018-07-29 – 2018-07-30 (×2): 0.5 mg via INTRAVENOUS
  Filled 2018-07-29 (×2): qty 1

## 2018-07-29 MED ORDER — HYDROCODONE-ACETAMINOPHEN 5-325 MG PO TABS: 1.0000 | ORAL_TABLET | Freq: Once | ORAL | Status: DC

## 2018-07-29 MED ORDER — FENTANYL CITRATE (PF) 100 MCG/2ML IJ SOLN
50.0000 ug | INTRAMUSCULAR | Status: AC | PRN
  Administered 2018-07-29 (×2): 50 ug via INTRAVENOUS
  Filled 2018-07-29 (×2): qty 2

## 2018-07-29 MED ORDER — HYDROCODONE-ACETAMINOPHEN 5-325 MG PO TABS
1.0000 | ORAL_TABLET | Freq: Once | ORAL | Status: AC
  Administered 2018-07-29: 1 via ORAL
  Filled 2018-07-29: qty 1

## 2018-07-29 MED ORDER — PANTOPRAZOLE SODIUM 40 MG IV SOLR
40.0000 mg | INTRAVENOUS | Status: DC
  Administered 2018-07-29: 40 mg via INTRAVENOUS
  Filled 2018-07-29: qty 40

## 2018-07-29 NOTE — ED Provider Notes (Addendum)
[facility] Emergency Department Provider Note    First MD Initiated Contact with Patient 07/29/18 1810     (approximate)  I have reviewed the triage vital signs and the nursing notes.   HISTORY  Chief Complaint Abdominal Pain    HPI David Miranda is a 33 y.o. male with a history of pancreatitis status post recent admission to hospital for pancreatitis who presents the ER for similar symptoms nausea and inability to keep any food down.  States he is able to tolerate clear liquids and feels that symptoms worsened after the patient was encouraged to eat food prior to discharge.  Denies any fevers.  Denies any hematemesis.  No diarrhea.  States he is unable to afford any other pain medication that he was prescribed.  Has follow-up with GI in 2 weeks but has not establish care with him yet.    Past Medical History:  Diagnosis Date  . Pancreatitis    Family History  Problem Relation Age of Onset  . Cancer Other    Past Surgical History:  Procedure Laterality Date  . NO PAST SURGERIES     Patient Active Problem List   Diagnosis Date Noted  . Acute necrotizing pancreatitis 07/24/2018  . Cannabis abuse 03/27/2018  . Acute kidney injury ([place])   . Hypomagnesemia   . Acute on chronic pancreatitis ([place]) 03/26/2018  . GERD (gastroesophageal reflux disease) 03/26/2018  . Acute renal failure (ARF) ([place]) 03/26/2018  . Hypokalemia 02/27/2018  . Essential hypertension 02/27/2018  . Tobacco abuse 02/27/2018  . Acute pancreatitis 02/27/2018  . Constipation   . Annular pancreas   . Pancreatitis   . Non-intractable vomiting with nausea   . Hepatic steatosis   . 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 Leukocytosis 12/27/2017  . Abdominal pain, epigastric 12/27/2017  . Acute alcoholic pancreatitis 95/62/1308  . Alcohol abuse 12/26/2017      Prior to Admission medications   Medication Sig Start Date End Date Taking? Authorizing Provider  folic acid  (FOLVITE) 1 MG tablet Take 1 tablet (1 mg total) by mouth daily. 07/27/18 07/27/19  Murlean Iba, MD  HYDROcodone-acetaminophen (NORCO/VICODIN) 5-325 MG tablet Take 1 tablet by mouth every 6 (six) hours as needed for up to 3 days for severe pain. 07/27/18 07/30/18  Murlean Iba, MD  lipase/protease/amylase (CREON) 36000 UNITS CPEP capsule Take 2 capsules (72,000 Units total) by mouth 3 (three) times daily with meals. 07/27/18   Johnson, Clanford L, MD  omeprazole (PRILOSEC) 40 MG capsule Take 1 capsule (40 mg total) by mouth daily. 07/27/18   Johnson, Clanford L, MD  thiamine (VITAMIN B-1) 100 MG tablet Take 1 tablet (100 mg total) by mouth daily. 07/27/18   Murlean Iba, MD    Allergies Patient has no known allergies.    Social History Social History   Tobacco Use  . Smoking status: Current Every Day Smoker    Packs/day: 1.00    Types: Cigarettes  . Smokeless tobacco: Never Used  Substance Use Topics  . Alcohol use: Yes    Comment: last large amount of alcohol last night 2-18/20  . Drug use: Not Currently    Types: Marijuana    Comment: occasionally    Review of Systems Patient denies headaches, rhinorrhea, blurry vision, numbness, shortness of breath, chest pain, edema, cough, abdominal pain, nausea, vomiting, diarrhea, dysuria, fevers, rashes or hallucinations unless otherwise stated above in HPI. ____________________________________________   PHYSICAL EXAM:  VITAL SIGNS: Vitals:   07/29/18 1755  07/29/18 2149  BP: 134/83 (!) 140/99  Pulse: (!) 57 (!) 49  Resp: 18 14  Temp: 98.4 F (36.9 C)   SpO2: 100% 97%    Constitutional: Alert and oriented.  Eyes: Conjunctivae are normal.  Head: Atraumatic. Nose: No congestion/rhinnorhea. Mouth/Throat: Mucous membranes are moist.   Neck: No stridor. Painless ROM.  Cardiovascular: Normal rate, regular rhythm. Grossly normal heart sounds.  Good peripheral circulation. Respiratory: Normal respiratory effort.  No  retractions. Lungs CTAB. Gastrointestinal: Soft with epigastric ttp. No distention. No abdominal bruits. No CVA tenderness. Genitourinary:  Musculoskeletal: No lower extremity tenderness nor edema.  No joint effusions. Neurologic:  Normal speech and language. No gross focal neurologic deficits are appreciated. No facial droop Skin:  Skin is warm, dry and intact. No rash noted. Psychiatric: Mood and affect are normal. Speech and behavior are normal.  ____________________________________________   LABS (all labs ordered are listed, but only abnormal results are displayed)  Results for orders placed or performed during the hospital encounter of 07/29/18 (from the past 24 hour(s))  Lipase, blood     Status: Abnormal   Collection Time: 07/29/18  3:06 PM  Result Value Ref Range   Lipase 116 (H) 11 - 51 U/L  Comprehensive metabolic panel     Status: Abnormal   Collection Time: 07/29/18  3:06 PM  Result Value Ref Range   Sodium 139 135 - 145 mmol/L   Potassium 3.9 3.5 - 5.1 mmol/L   Chloride 104 98 - 111 mmol/L   CO2 23 22 - 32 mmol/L   Glucose, Bld 106 (H) 70 - 99 mg/dL   BUN 5 (L) 6 - 20 mg/dL   Creatinine, Ser 0.77 0.61 - 1.24 mg/dL   Calcium 10.2 8.9 - 10.3 mg/dL   Total Protein 8.5 (H) 6.5 - 8.1 g/dL   Albumin 4.3 3.5 - 5.0 g/dL   AST 20 15 - 41 U/L   ALT 18 0 - 44 U/L   Alkaline Phosphatase 72 38 - 126 U/L   Total Bilirubin 0.6 0.3 - 1.2 mg/dL   GFR calc non Af Amer >60 >60 mL/min   GFR calc Af Amer >60 >60 mL/min   Anion gap 12 5 - 15  CBC     Status: Abnormal   Collection Time: 07/29/18  3:06 PM  Result Value Ref Range   WBC 10.6 (H) 4.0 - 10.5 K/uL   RBC 6.04 (H) 4.22 - 5.81 MIL/uL   Hemoglobin 13.6 13.0 - 17.0 g/dL   HCT 43.7 39.0 - 52.0 %   MCV 72.4 (L) 80.0 - 100.0 fL   MCH 22.5 (L) 26.0 - 34.0 pg   MCHC 31.1 30.0 - 36.0 g/dL   RDW 18.7 (H) 11.5 - 15.5 %   Platelets 566 (H) 150 - 400 K/uL   nRBC 0.0 0.0 - 0.2 %  Urinalysis, Complete w Microscopic     Status:  Abnormal   Collection Time: 07/29/18  3:06 PM  Result Value Ref Range   Color, Urine YELLOW (A) YELLOW   APPearance CLEAR (A) CLEAR   Specific Gravity, Urine 1.016 1.005 - 1.030   pH 7.0 5.0 - 8.0   Glucose, UA NEGATIVE NEGATIVE mg/dL   Hgb urine dipstick NEGATIVE NEGATIVE   Bilirubin Urine NEGATIVE NEGATIVE   Ketones, ur NEGATIVE NEGATIVE mg/dL   Protein, ur NEGATIVE NEGATIVE mg/dL   Nitrite NEGATIVE NEGATIVE   Leukocytes,Ua NEGATIVE NEGATIVE   WBC, UA 0-5 0 - 5 WBC/hpf   Bacteria, UA NONE  SEEN NONE SEEN   Squamous Epithelial / LPF NONE SEEN 0 - 5   Mucus PRESENT    Sperm, UA PRESENT    ________________________________________________  ED ECG REPORT I, Merlyn Lot, the attending physician, personally viewed and interpreted this ECG.   Date: 07/29/2018  EKG Time: 20:22  Rate: 50  Rhythm: sinus  Axis: normal  Intervals:normal intervals  ST&T Change: no stemi   RADIOLOGY   ____________________________________________   PROCEDURES  Procedure(s) performed:  Procedures    Critical Care performed: no ____________________________________________   INITIAL IMPRESSION / ASSESSMENT AND PLAN / ED COURSE  Pertinent labs & imaging results that were available during my care of the patient were reviewed by me and considered in my medical decision making (see chart for details).   DDX: pancreatitis, enteritis, gastritis, dehydration, electrolyte abnormality  Fontaine Hehl is a 33 y.o. who presents to the ED with probable acute on chronic pancreatitis.  Patient does appear uncomfortable.  Will give IV pain medications IV fluids.  Do not feel that repeat CT imaging clinically indicated at this time.  Clinical Course as of Jul 29 2213  Mon Jul 29, 2018  2058 Reassessed.  Has received multiple doses of IV pain medication.  Still with severe pain.  Will discuss case with hospitalist for observation for pain control given pancreatitis.   [PR]    Clinical Course User  Index [PR] Merlyn Lot, MD     As part of my medical decision making, I reviewed the following data within the [place] notes reviewed and incorporated, Labs reviewed, notes from prior ED visits and [place] Controlled Substance Database   ____________________________________________   FINAL CLINICAL IMPRESSION(S) / ED DIAGNOSES  Final diagnoses:  Chronic pancreatitis, unspecified pancreatitis type ([place])  Epigastric pain      NEW MEDICATIONS STARTED DURING THIS VISIT:  New Prescriptions   No medications on file     Note:  This document was prepared using Dragon voice recognition software and may include unintentional dictation errors.    Merlyn Lot, MD 07/29/18 2215    Merlyn Lot, MD 08/06/18 [phone]

## 2018-07-29 NOTE — ED Triage Notes (Signed)
Pt states he was just released from [facility] 2 days ago for pancreatitis, states the pain has returned and worse then before with N/V.David Miranda denies any alcohol use, states he has been eating solid foods. Told him he could eat whatever he wanted.

## 2018-07-29 NOTE — H&P (Signed)
[facility] NAME: David Miranda    MR#:  [id number]  DATE OF BIRTH:  1985-07-03  DATE OF ADMISSION:  07/29/2018  PRIMARY CARE PHYSICIAN: Patient, No Pcp Per   REQUESTING/REFERRING PHYSICIAN: Quentin Cornwall, MD  CHIEF COMPLAINT:   Chief Complaint  Patient presents with  . Abdominal Pain    HISTORY OF PRESENT ILLNESS:  David Miranda  is a 33 y.o. male who presents with chief complaint as above.  Patient presents to the ED with a complaint of epigastric abdominal pain radiating through to his back.  He has a history of chronic pancreatitis and was recently admitted at another facility for a flareup.  He was discharged from that facility 3 days ago.  He states that he feels like he may have been discharged too soon.  He presents to our emergency department today for worsening pain again.  His lipase is elevated at 116.  He has an appointment to see GI in a week or 2.  He was treated for pancreatitis in the ED and hospitalist were called for admission.  PAST MEDICAL HISTORY:   Past Medical History:  Diagnosis Date  . Pancreatitis      PAST SURGICAL HISTORY:   Past Surgical History:  Procedure Laterality Date  . NO PAST SURGERIES       SOCIAL HISTORY:   Social History   Tobacco Use  . Smoking status: Current Every Day Smoker    Packs/day: 1.00    Types: Cigarettes  . Smokeless tobacco: Never Used  Substance Use Topics  . Alcohol use: Yes    Comment: last large amount of alcohol last night 2-18/20     FAMILY HISTORY:   Family History  Problem Relation Age of Onset  . Cancer Other      DRUG ALLERGIES:  No Known Allergies  MEDICATIONS AT HOME:   Prior to Admission medications   Medication Sig Start Date End Date Taking? Authorizing Provider  folic acid (FOLVITE) 1 MG tablet Take 1 tablet (1 mg total) by mouth daily. 07/27/18 07/27/19  Murlean Iba, MD  HYDROcodone-acetaminophen (NORCO/VICODIN) 5-325 MG  tablet Take 1 tablet by mouth every 6 (six) hours as needed for up to 3 days for severe pain. 07/27/18 07/30/18  Murlean Iba, MD  lipase/protease/amylase (CREON) 36000 UNITS CPEP capsule Take 2 capsules (72,000 Units total) by mouth 3 (three) times daily with meals. 07/27/18   Johnson, Clanford L, MD  omeprazole (PRILOSEC) 40 MG capsule Take 1 capsule (40 mg total) by mouth daily. 07/27/18   Johnson, Clanford L, MD  thiamine (VITAMIN B-1) 100 MG tablet Take 1 tablet (100 mg total) by mouth daily. 07/27/18   Murlean Iba, MD    REVIEW OF SYSTEMS:  Review of Systems  Constitutional: Negative for chills, fever, malaise/fatigue and weight loss.  HENT: Negative for ear pain, hearing loss and tinnitus.   Eyes: Negative for blurred vision, double vision, pain and redness.  Respiratory: Negative for cough, hemoptysis and shortness of breath.   Cardiovascular: Negative for chest pain, palpitations, orthopnea and leg swelling.  Gastrointestinal: Positive for abdominal pain and nausea. Negative for constipation, diarrhea and vomiting.  Genitourinary: Negative for dysuria, frequency and hematuria.  Musculoskeletal: Negative for back pain, joint pain and neck pain.  Skin:       No acne, rash, or lesions  Neurological: Negative for dizziness, tremors, focal weakness and weakness.  Endo/Heme/Allergies: Negative for polydipsia. Does not bruise/bleed easily.  Psychiatric/Behavioral: Negative for depression. The patient is not nervous/anxious and does not have insomnia.      VITAL SIGNS:   Vitals:   07/29/18 1456 07/29/18 1457 07/29/18 1755 07/29/18 2149  BP: (!) 131/107  134/83 (!) 140/99  Pulse: 69  (!) 57 (!) 49  Resp: 20  18 14   Temp: 97.9 F (36.6 C)  98.4 F (36.9 C)   TempSrc: Oral  Oral   SpO2: 100%  100% 97%  Weight:  79.4 kg    Height:  6' (1.829 m)     Wt Readings from Last 3 Encounters:  07/29/18 79.4 kg  07/24/18 79.4 kg  03/26/18 75.8 kg    PHYSICAL EXAMINATION:   Physical Exam  Vitals reviewed. Constitutional: He is oriented to person, place, and time. He appears well-developed and well-nourished. No distress.  HENT:  Head: Normocephalic and atraumatic.  Mouth/Throat: Oropharynx is clear and moist.  Eyes: Pupils are equal, round, and reactive to light. Conjunctivae and EOM are normal. No scleral icterus.  Neck: Normal range of motion. Neck supple. No JVD present. No thyromegaly present.  Cardiovascular: Normal rate, regular rhythm and intact distal pulses. Exam reveals no gallop and no friction rub.  No murmur heard. Respiratory: Effort normal and breath sounds normal. No respiratory distress. He has no wheezes. He has no rales.  GI: Soft. Bowel sounds are normal. He exhibits no distension. There is abdominal tenderness.  Musculoskeletal: Normal range of motion.        General: No edema.     Comments: No arthritis, no gout  Lymphadenopathy:    He has no cervical adenopathy.  Neurological: He is alert and oriented to person, place, and time. No cranial nerve deficit.  No dysarthria, no aphasia  Skin: Skin is warm and dry. No rash noted. No erythema.  Psychiatric: He has a normal mood and affect. His behavior is normal. Judgment and thought content normal.    LABORATORY PANEL:   CBC Recent Labs  Lab 07/29/18 1506  WBC 10.6*  HGB 13.6  HCT 43.7  PLT 566*   ------------------------------------------------------------------------------------------------------------------  Chemistries  Recent Labs  Lab 07/27/18 0604 07/29/18 1506  NA 138 139  K 4.0 3.9  CL 106 104  CO2 23 23  GLUCOSE 125* 106*  BUN <5* 5*  CREATININE 0.73 0.77  CALCIUM 8.8* 10.2  MG 2.0  --   AST 14* 20  ALT 14 18  ALKPHOS 63 72  BILITOT 0.2* 0.6   ------------------------------------------------------------------------------------------------------------------  Cardiac Enzymes No results for input(s): TROPONINI in the last 168  hours. ------------------------------------------------------------------------------------------------------------------  RADIOLOGY:  No results found.  EKG:   Orders placed or performed during the hospital encounter of 07/29/18  . ED EKG  . ED EKG  . EKG 12-Lead  . EKG 12-Lead    IMPRESSION AND PLAN:  Principal Problem:   Acute on chronic pancreatitis ([place]) -his pancreatitis was originally due to alcohol use.  Will admit him, keep him n.p.o. for now, use PRN analgesia and antiemetics to help control his pain and nausea, aggressive IV fluids, GI consult Active Problems:   Essential hypertension -continue home dose antihypertensives   Alcohol abuse -he does not seem to be in any sort of withdrawal, will monitor him closely and initiate CIWA protocol if he starts to show signs of withdrawal   GERD (gastroesophageal reflux disease) -home dose PPI  Chart review performed and case discussed with ED provider. Labs, imaging and/or ECG reviewed by provider and discussed with patient/family. Management  plans discussed with the patient and/or family.  DVT PROPHYLAXIS: SubQ lovenox   GI PROPHYLAXIS:  PPI   ADMISSION STATUS: Observation  CODE STATUS: Full Code Status History    Date Active Date Inactive Code Status Order ID Comments User Context   03/26/2018 1510 03/29/2018 1532 Full Code [id number]  Barton Dubois, MD ED   02/27/2018 1814 03/02/2018 1942 Full Code [id number]  Heath Lark D, DO ED   12/26/2017 1716 01/03/2018 1909 Full Code [id number]  Kathie Dike, MD Inpatient      TOTAL TIME TAKING CARE OF THIS PATIENT: 40 minutes.   Ethlyn Daniels 07/29/2018, 9:53 PM  [facility]  Office  [phone]  CC: Primary care physician; Patient, No Pcp Per  Note:  This document was prepared using Dragon voice recognition software and may include unintentional dictation errors.

## 2018-07-30 LAB — CBC
HCT: 35.1 % — ABNORMAL LOW (ref 39.0–52.0)
Hemoglobin: 10.6 g/dL — ABNORMAL LOW (ref 13.0–17.0)
MCH: 22.5 pg — ABNORMAL LOW (ref 26.0–34.0)
MCHC: 30.2 g/dL (ref 30.0–36.0)
MCV: 74.4 fL — ABNORMAL LOW (ref 80.0–100.0)
Platelets: 424 10*3/uL — ABNORMAL HIGH (ref 150–400)
RBC: 4.72 MIL/uL (ref 4.22–5.81)
RDW: 17.8 % — ABNORMAL HIGH (ref 11.5–15.5)
WBC: 8.5 10*3/uL (ref 4.0–10.5)
nRBC: 0 % (ref 0.0–0.2)

## 2018-07-30 LAB — BASIC METABOLIC PANEL
Anion gap: 4 — ABNORMAL LOW (ref 5–15)
BUN: 6 mg/dL (ref 6–20)
CO2: 26 mmol/L (ref 22–32)
Calcium: 8.7 mg/dL — ABNORMAL LOW (ref 8.9–10.3)
Chloride: 110 mmol/L (ref 98–111)
Creatinine, Ser: 0.84 mg/dL (ref 0.61–1.24)
GFR calc Af Amer: >60 mL/min (ref >60)
GFR calc non Af Amer: >60 mL/min (ref >60)
Glucose, Bld: 95 mg/dL (ref 70–99)
Potassium: 4.2 mmol/L (ref 3.5–5.1)
Sodium: 140 mmol/L (ref 135–145)

## 2018-07-30 LAB — LIPASE, BLOOD: Lipase: 101 U/L — ABNORMAL HIGH (ref 11–51)

## 2018-07-30 NOTE — Progress Notes (Signed)
Noted patient has left AMA  Dr Jonathon Bellows MD,MRCP [facility]) Gastroenterology/Hepatology Pager: [phone]

## 2018-07-30 NOTE — Plan of Care (Signed)
Patient stated he is removing IV himself and leaving if I don't remove for him.  Dr. Informed that patient is unhappy about pain meds given and that he was "receiving more at other hospitals.  Dr. Sherrin Daisy to have him leave AMA since treatment is not complete and he did not feel he needed more pain meds at this time.  Patient signed [facility] paperwork, understanding that since treatment has not been completed, re-occurrence of pancreatitis and re-hospitalization is very probable. IV removed and patient walked out.

## 2018-07-30 NOTE — Discharge Summary (Signed)
[employer] [facility] [place] at [facility]   PATIENT NAME: David Miranda    MR#:  [id number]  DATE OF BIRTH:  11-Feb-1986  DATE OF ADMISSION:  07/29/2018   ADMITTING PHYSICIAN: Oralia Manis, MD  DATE OF DISCHARGE: 07/30/2018  PRIMARY CARE PHYSICIAN: Patient, No Pcp Per   ADMISSION DIAGNOSIS:  Epigastric pain [R10.13] Chronic pancreatitis, unspecified pancreatitis type (HCC) [K86.1] DISCHARGE DIAGNOSIS:  Principal Problem:   Acute on chronic pancreatitis (HCC) Active Problems:   Alcohol abuse   Essential hypertension   GERD (gastroesophageal reflux disease)  SECONDARY DIAGNOSIS:   Past Medical History:  Diagnosis Date  . Pancreatitis    HOSPITAL COURSE:   Patient left AGAINST MEDICAL ADVICE  Chief complaint; abdominal pains  History of presenting complaint David Miranda  is a 33 y.o. male who presented to the ED with a complaint of epigastric abdominal pain radiating through to his back.  He has a history of chronic pancreatitis and was recently admitted at another facility for a flareup.  He was discharged from that facility 3 days ago.  He stated that he felt like he may have been discharged too soon.  He presents to our emergency department today for worsening pain again.  His lipase is elevated at 116.  He has an appointment to see GI in a week or 2.  He was treated for pancreatitis in the ED and hospitalist were called for admission.   Hospital course; 1. Acute on chronic pancreatitis (HCC) -his pancreatitis was originally due to alcohol use.  Appears to have been triggered again with alcohol use.  Patient was initially kept n.p.o.  Admitted to medical service.  Placed on IV fluids and as needed Dilaudid for pain control and IV fluids.  Gastroenterology consult was placed. This morning prior to my evaluation patient was already requesting for a dose of Dilaudid to be given and planning to leave after getting IV Dilaudid.  At the time of my arrival patient was  sleeping.  I woke patient up and updated patient on treatment plans with plans to initiate clear liquid diet and advance as tolerated.  Patient immediately started requesting for IV Dilaudid.  Advised patient against increasing frequency of current dose of Dilaudid.  Clear liquid was ordered.  Counseling was done and patient advised to avoid alcohol use since this would likely cause recurrent admissions for pancreatitis. Patient later decided to leave AGAINST MEDICAL ADVICE.    DISCHARGE CONDITIONS:  Patient left AGAINST MEDICAL ADVICE CONSULTS OBTAINED:   DRUG ALLERGIES:  No Known Allergies DISCHARGE MEDICATIONS:      DISCHARGE INSTRUCTIONS:   DIET:  Clear liquid diet DISCHARGE CONDITION:  Fair ACTIVITY:  Activity as tolerated OXYGEN:  Home Oxygen: No.  Oxygen Delivery: room air DISCHARGE LOCATION:  home   If you experience worsening of your admission symptoms, develop shortness of breath, life threatening emergency, suicidal or homicidal thoughts you must seek medical attention immediately by calling 911 or calling your MD immediately  if symptoms less severe.  You Must read complete instructions/literature along with all the possible adverse reactions/side effects for all the Medicines you take and that have been prescribed to you. Take any new Medicines after you have completely understood and accpet all the possible adverse reactions/side effects.   Please note  You were cared for by a hospitalist during your hospital stay. If you have any questions about your discharge medications or the care you received while you were in the hospital after you are discharged,  you can call the unit and asked to speak with the hospitalist on call if the hospitalist that took care of you is not available. Once you are discharged, your primary care physician will handle any further medical issues. Please note that NO REFILLS for any discharge medications will be authorized once you are  discharged, as it is imperative that you return to your primary care physician (or establish a relationship with a primary care physician if you do not have one) for your aftercare needs so that they can reassess your need for medications and monitor your lab values.    On the day of Discharge:  VITAL SIGNS:  Blood pressure (!) 141/98, pulse (!) 49, temperature (!) 97.5 F (36.4 C), temperature source Oral, resp. rate 18, height 6' (1.829 m), weight 80.1 kg, SpO2 100 %. PHYSICAL EXAMINATION:  GENERAL:  33 y.o.-year-old patient lying in the bed with no acute distress.  EYES: Pupils equal, round, reactive to light and accommodation. No scleral icterus. Extraocular muscles intact.  HEENT: Head atraumatic, normocephalic. Oropharynx and nasopharynx clear.  NECK:  Supple, no jugular venous distention. No thyroid enlargement, no tenderness.  LUNGS: Normal breath sounds bilaterally, no wheezing, rales,rhonchi or crepitation. No use of accessory muscles of respiration.  CARDIOVASCULAR: S1, S2 normal. No murmurs, rubs, or gallops.  ABDOMEN: Soft, non-tender, non-distended. Bowel sounds present. No organomegaly or mass.  EXTREMITIES: No pedal edema, cyanosis, or clubbing.  NEUROLOGIC: Cranial nerves II through XII are intact. Muscle strength 5/5 in all extremities. Sensation intact. Gait not checked.  PSYCHIATRIC: The patient is alert and oriented x 3.  SKIN: No obvious rash, lesion, or ulcer.  DATA REVIEW:   CBC Recent Labs  Lab 07/30/18 0544  WBC 8.5  HGB 10.6*  HCT 35.1*  PLT 424*    Chemistries  Recent Labs  Lab 07/27/18 0604 07/29/18 1506 07/30/18 0544  NA 138 139 140  K 4.0 3.9 4.2  CL 106 104 110  CO2 23 23 26   GLUCOSE 125* 106* 95  BUN <5* 5* 6  CREATININE 0.73 0.77 0.84  CALCIUM 8.8* 10.2 8.7*  MG 2.0  --   --   AST 14* 20  --   ALT 14 18  --   ALKPHOS 63 72  --   BILITOT 0.2* 0.6  --      Microbiology Results  Results for orders placed or performed during the  hospital encounter of 12/26/17  Culture, blood (Routine X 2) w Reflex to ID Panel     Status: None   Collection Time: 12/27/17 10:06 AM  Result Value Ref Range Status   Specimen Description BLOOD RIGHT ARM  Final   Special Requests   Final    BOTTLES DRAWN AEROBIC AND ANAEROBIC Blood Culture results may not be optimal due to an inadequate volume of blood received in culture bottles   Culture   Final    NO GROWTH 5 DAYS Performed at [facility], [address] [place], [place] [address]    Report Status 01/01/2018 FINAL  Final  Culture, blood (Routine X 2) w Reflex to ID Panel     Status: None   Collection Time: 12/27/17 10:20 AM  Result Value Ref Range Status   Specimen Description BLOOD RIGHT ARM  Final   Special Requests   Final    BOTTLES DRAWN AEROBIC AND ANAEROBIC Blood Culture adequate volume   Culture   Final    NO GROWTH 5 DAYS Performed at [facility], [address] [place], [place] [address]    Report Status 01/01/2018 FINAL  Final  Culture, blood (Routine X 2) w Reflex to ID Panel     Status: None   Collection Time: 12/29/17  4:58 PM  Result Value Ref Range Status   Specimen Description RIGHT ANTECUBITAL  Final   Special Requests   Final    BOTTLES DRAWN AEROBIC AND ANAEROBIC Blood Culture adequate volume   Culture   Final    NO GROWTH 5 DAYS Performed at [facility], [address], [place], [place] [address]    Report Status 01/03/2018 FINAL  Final  Culture, blood (Routine X 2) w Reflex to ID Panel     Status: None   Collection Time: 12/29/17  4:59 PM  Result Value Ref Range Status   Specimen Description BLOOD RIGHT ARM  Final   Special Requests   Final    BOTTLES DRAWN AEROBIC AND ANAEROBIC Blood Culture adequate volume   Culture   Final    NO GROWTH 5 DAYS Performed at [facility], [address] [place], [place] [address]    Report Status 01/03/2018 FINAL  Final    RADIOLOGY:  No results found.   Management plans discussed with the patient,  family and they are in agreement.  CODE STATUS: Full Code   TOTAL TIME TAKING CARE OF THIS PATIENT: 36 minutes.    David Miranda M.D on 07/30/2018 at 11:07 AM  Between 7am to 6pm - Pager - [phone]  After 6pm go to [email or web address] - Social research officer, government  [employer] [place] [facility]  Office  [phone]  CC: Primary care physician; Patient, No Pcp Per   Note: This dictation was prepared with Dragon dictation along with smaller phrase technology. Any transcriptional errors that result from this process are unintentional.

## 2018-11-07 ENCOUNTER — Telehealth: Payer: Self-pay | Admitting: Gastroenterology

## 2018-11-07 ENCOUNTER — Encounter: Payer: Self-pay | Admitting: Gastroenterology

## 2018-11-07 NOTE — Telephone Encounter (Signed)
Pt WAS A NO SHOWS FOR OPV AND MRI. OK TO KEEP APPT FOR JUL 2 W/ [facility]. PT SHOULD BE MADE AWARE OF THE APPT.

## 2018-11-12 NOTE — Telephone Encounter (Signed)
Pt was mailed appt letter and will get 2 reminder calls of appt.

## 2018-12-05 ENCOUNTER — Encounter: Payer: Self-pay | Admitting: Gastroenterology

## 2018-12-05 ENCOUNTER — Ambulatory Visit: Payer: Self-pay | Admitting: Gastroenterology

## 2018-12-05 ENCOUNTER — Telehealth: Payer: Self-pay | Admitting: Gastroenterology

## 2018-12-05 NOTE — Telephone Encounter (Signed)
REVIEWED-NO ADDITIONAL RECOMMENDATIONS. 

## 2018-12-05 NOTE — Telephone Encounter (Signed)
PT NO SHOW FOR OPV x3 AND MRI APPT x1. DISCHARGE FROM PRACTICE.Marland Kitchen

## 2018-12-05 NOTE — Telephone Encounter (Signed)
PATIENT WAS A NO SHOW AND LETTER SENT  °

## 2018-12-09 ENCOUNTER — Encounter: Payer: Self-pay | Admitting: General Practice

## 2018-12-09 NOTE — Telephone Encounter (Signed)
Discharge letter mailed  

## 2019-01-09 NOTE — Telephone Encounter (Signed)
Certified discharge letter return- unclaimed. I have resent the letter via [place].

## 2019-03-18 ENCOUNTER — Emergency Department (HOSPITAL_COMMUNITY)
Admission: EM | Admit: 2019-03-18 | Discharge: 2019-03-18 | Disposition: A | Discharge status: Home / Self Care | Payer: Self-pay | Attending: Emergency Medicine | Admitting: Emergency Medicine

## 2019-03-18 ENCOUNTER — Emergency Department (HOSPITAL_COMMUNITY): Payer: Self-pay

## 2019-03-18 DIAGNOSIS — F17210 Nicotine dependence, cigarettes, uncomplicated: Secondary | ICD-10-CM | POA: Insufficient documentation

## 2019-03-18 DIAGNOSIS — I10 Essential (primary) hypertension: Secondary | ICD-10-CM | POA: Insufficient documentation

## 2019-03-18 DIAGNOSIS — K209 Esophagitis, unspecified without bleeding: Secondary | ICD-10-CM | POA: Insufficient documentation

## 2019-03-18 DIAGNOSIS — E876 Hypokalemia: Secondary | ICD-10-CM | POA: Insufficient documentation

## 2019-03-18 DIAGNOSIS — Z79899 Other long term (current) drug therapy: Secondary | ICD-10-CM | POA: Insufficient documentation

## 2019-03-18 LAB — CBC WITH DIFFERENTIAL/PLATELET
Abs Immature Granulocytes: 0.09 10*3/uL — ABNORMAL HIGH (ref 0.00–0.07)
Basophils Absolute: 0.1 10*3/uL (ref 0.0–0.1)
Basophils Relative: 1 %
Eosinophils Absolute: 0 10*3/uL (ref 0.0–0.5)
Eosinophils Relative: 0 %
HCT: 29 % — ABNORMAL LOW (ref 39.0–52.0)
Hemoglobin: 9.2 g/dL — ABNORMAL LOW (ref 13.0–17.0)
Immature Granulocytes: 1 %
Lymphocytes Relative: 15 %
Lymphs Abs: 2.6 10*3/uL (ref 0.7–4.0)
MCH: 24.1 pg — ABNORMAL LOW (ref 26.0–34.0)
MCHC: 31.7 g/dL (ref 30.0–36.0)
MCV: 76.1 fL — ABNORMAL LOW (ref 80.0–100.0)
Monocytes Absolute: 1.2 10*3/uL — ABNORMAL HIGH (ref 0.1–1.0)
Monocytes Relative: 7 %
Neutro Abs: 13.4 10*3/uL — ABNORMAL HIGH (ref 1.7–7.7)
Neutrophils Relative %: 76 %
Platelets: 467 10*3/uL — ABNORMAL HIGH (ref 150–400)
RBC: 3.81 MIL/uL — ABNORMAL LOW (ref 4.22–5.81)
RDW: 16.1 % — ABNORMAL HIGH (ref 11.5–15.5)
WBC: 17.3 10*3/uL — ABNORMAL HIGH (ref 4.0–10.5)
nRBC: 0 % (ref 0.0–0.2)

## 2019-03-18 LAB — MAGNESIUM: Magnesium: 2 mg/dL (ref 1.7–2.4)

## 2019-03-18 LAB — COMPREHENSIVE METABOLIC PANEL
ALT: 12 U/L (ref 0–44)
AST: 16 U/L (ref 15–41)
Albumin: 4.2 g/dL (ref 3.5–5.0)
Alkaline Phosphatase: 59 U/L (ref 38–126)
Anion gap: 17 — ABNORMAL HIGH (ref 5–15)
BUN: 24 mg/dL — ABNORMAL HIGH (ref 6–20)
CO2: 31 mmol/L (ref 22–32)
Calcium: 9.7 mg/dL (ref 8.9–10.3)
Chloride: 86 mmol/L — ABNORMAL LOW (ref 98–111)
Creatinine, Ser: 0.99 mg/dL (ref 0.61–1.24)
GFR calc Af Amer: >60 mL/min (ref >60)
GFR calc non Af Amer: >60 mL/min (ref >60)
Glucose, Bld: 180 mg/dL — ABNORMAL HIGH (ref 70–99)
Potassium: 2.5 mmol/L — CL (ref 3.5–5.1)
Sodium: 134 mmol/L — ABNORMAL LOW (ref 135–145)
Total Bilirubin: 0.3 mg/dL (ref 0.3–1.2)
Total Protein: 7.9 g/dL (ref 6.5–8.1)

## 2019-03-18 LAB — LIPASE, BLOOD: Lipase: 51 U/L (ref 11–51)

## 2019-03-18 MED ORDER — POTASSIUM CHLORIDE 10 MEQ/100ML IV SOLN
10.0000 meq | INTRAVENOUS | Status: AC
  Administered 2019-03-18 (×3): 10 meq via INTRAVENOUS
  Filled 2019-03-18 (×3): qty 100

## 2019-03-18 MED ORDER — SUCRALFATE 1 G PO TABS
1.0000 g | ORAL_TABLET | Freq: Once | ORAL | Status: AC
  Administered 2019-03-18: 07:00:00 1 g via ORAL
  Filled 2019-03-18: qty 1

## 2019-03-18 MED ORDER — SODIUM CHLORIDE 0.9 % IV BOLUS
1000.0000 mL | Freq: Once | INTRAVENOUS | Status: AC
  Administered 2019-03-18: 06:00:00 1000 mL via INTRAVENOUS

## 2019-03-18 MED ORDER — LIDOCAINE VISCOUS HCL 2 % MT SOLN
15.0000 mL | Freq: Once | OROMUCOSAL | Status: AC
  Administered 2019-03-18: 15 mL via ORAL
  Filled 2019-03-18: qty 15

## 2019-03-18 MED ORDER — OMEPRAZOLE 40 MG PO CPDR: 40.0000 mg | DELAYED_RELEASE_CAPSULE | Freq: Every day | ORAL | 0 refills | Status: DC

## 2019-03-18 MED ORDER — SODIUM CHLORIDE 0.9 % IV BOLUS
1000.0000 mL | Freq: Once | INTRAVENOUS | Status: AC
  Administered 2019-03-18: 04:00:00 1000 mL via INTRAVENOUS

## 2019-03-18 MED ORDER — IOHEXOL 300 MG/ML  SOLN
100.0000 mL | Freq: Once | INTRAMUSCULAR | Status: AC | PRN
  Administered 2019-03-18: 06:00:00 100 mL via INTRAVENOUS

## 2019-03-18 MED ORDER — SUCRALFATE 1 G PO TABS: 1.0000 g | ORAL_TABLET | Freq: Three times a day (TID) | ORAL | 0 refills | Status: DC

## 2019-03-18 MED ORDER — PANTOPRAZOLE SODIUM 40 MG IV SOLR
40.0000 mg | Freq: Once | INTRAVENOUS | Status: AC
  Administered 2019-03-18: 07:00:00 40 mg via INTRAVENOUS
  Filled 2019-03-18: qty 40

## 2019-03-18 MED ORDER — POTASSIUM CHLORIDE CRYS ER 20 MEQ PO TBCR: 20.0000 meq | EXTENDED_RELEASE_TABLET | Freq: Two times a day (BID) | ORAL | 0 refills | Status: DC

## 2019-03-18 MED ORDER — ALUM & MAG HYDROXIDE-SIMETH 200-200-20 MG/5ML PO SUSP
30.0000 mL | Freq: Once | ORAL | Status: AC
  Administered 2019-03-18: 05:00:00 30 mL via ORAL
  Filled 2019-03-18: qty 30

## 2019-03-18 MED ORDER — ONDANSETRON HCL 4 MG/2ML IJ SOLN
4.0000 mg | Freq: Once | INTRAMUSCULAR | Status: AC
  Administered 2019-03-18: 04:00:00 4 mg via INTRAVENOUS
  Filled 2019-03-18: qty 2

## 2019-03-18 MED ORDER — HYDROMORPHONE HCL 1 MG/ML IJ SOLN
1.0000 mg | Freq: Once | INTRAMUSCULAR | Status: AC
  Administered 2019-03-18: 04:00:00 1 mg via INTRAVENOUS
  Filled 2019-03-18: qty 1

## 2019-03-18 MED ORDER — ONDANSETRON 4 MG PO TBDP: 4.0000 mg | ORAL_TABLET | Freq: Three times a day (TID) | ORAL | 0 refills | Status: DC | PRN

## 2019-03-18 MED ORDER — FENTANYL CITRATE (PF) 100 MCG/2ML IJ SOLN
50.0000 ug | Freq: Once | INTRAMUSCULAR | Status: AC
  Administered 2019-03-18: 05:00:00 50 ug via INTRAVENOUS
  Filled 2019-03-18: qty 2

## 2019-03-18 NOTE — Discharge Instructions (Addendum)
Your testing shows evidence of esophagitis and low potassium.  You should restart your Prilosec and take this every day.  Avoid alcohol, caffeine, NSAID medications, spicy foods.  Follow-up with the gastroenterologist for an endoscopy. You should have another scan of your pancreas in 6 months to make sure that the cysts are not getting bigger.  Return to the ED for worsening pain, fever, persistent vomiting, blood in your vomit or any other concerns.

## 2019-03-18 NOTE — ED Notes (Signed)
Patient transported to CT 

## 2019-03-18 NOTE — ED Provider Notes (Signed)
Blood pressure (!) 146/105, pulse 85, temperature 98.2 F (36.8 C), temperature source Oral, resp. rate 18, height 6' (1.829 m), weight 77.1 kg, SpO2 100 %.  Assuming care from Dr. Wyvonnia Dusky.  In short, David Miranda is a 33 y.o. male with a chief complaint of Abdominal Pain .  Refer to the original H&P for additional details.  The current plan of care is to f/u after potassium.  10:10 AM  Patient now with potassium runs complete.  Discussed his lab work, medications called into the pharmacy, need for GI follow-up.  Discussed his downtrending hemoglobin slightly.  No indication for PRBC transfusion at this time.  Patient defers rectal exam at this time.  Discussed the urgent nature and need for GI follow-up.  Provided name of local gastroenterologist. Discussed ED return precautions.     Margette Fast, MD 03/18/19 1013

## 2019-03-18 NOTE — ED Notes (Signed)
Date and time results received: 03/18/19 05:26 (use smartphrase ".now" to insert current time)  Test:potassium Critical Value: 2.5  Name of Provider Notified:[place]  Orders Received? Or Actions Taken?: Provider notified

## 2019-03-18 NOTE — ED Provider Notes (Signed)
[facility] EMERGENCY DEPARTMENT Provider Note   CSN: [id number] Arrival date & time: 03/18/19  0249     History   Chief Complaint Chief Complaint  Patient presents with   Abdominal Pain    HPI David Miranda is a 33 y.o. male.     Level 5 caveat, patient moaning in pain.  Brought in by EMS with a 4-day history of epigastric abdominal pain with nausea and vomiting.  Similar to previous episodes of pancreatitis.  Reports pain is in his abdomen that radiates to his back.  Admits to alcohol use 4 days ago and pain started shortly after that.  Emesis has been nonbloody.  Last episode of emesis was 24 hours ago.  Denies any blood in the stool.  Admits to marijuana use but no other drugs.  States he does not drink alcohol on a regular basis.  The pain is constant.  No previous abdominal surgeries.  No chest pain or shortness of breath.  States he takes omeprazole for stomach but has run out.  No history of EGD or colonoscopy.  No known fever.  The history is provided by the patient and the EMS personnel. The history is limited by the condition of the patient.  Abdominal Pain Associated symptoms: nausea and vomiting   Associated symptoms: no chest pain, no cough, no diarrhea, no dysuria, no fever, no hematuria and no shortness of breath     Past Medical History:  Diagnosis Date   Pancreatitis     Patient Active Problem List   Diagnosis Date Noted   Acute necrotizing pancreatitis 07/24/2018   Cannabis abuse 03/27/2018   Acute kidney injury ([place])    Hypomagnesemia    Acute on chronic pancreatitis ([place]) 03/26/2018   GERD (gastroesophageal reflux disease) 03/26/2018   Acute renal failure (ARF) ([place]) 03/26/2018   Hypokalemia 02/27/2018   Essential hypertension 02/27/2018   Tobacco abuse 02/27/2018   Acute pancreatitis 02/27/2018   Constipation    Annular pancreas    Pancreatitis    Non-intractable vomiting with nausea    Hepatic steatosis    Necrotizing  pancreatitis 12/27/2017   Leukocytosis 12/27/2017   Abdominal pain, epigastric [id number]   Acute alcoholic pancreatitis 0000000   Alcohol abuse 12/26/2017    Past Surgical History:  Procedure Laterality Date   NO PAST SURGERIES          Home Medications    Prior to Admission medications   Medication Sig Start Date End Date Taking? Authorizing Provider  folic acid (FOLVITE) 1 MG tablet Take 1 tablet (1 mg total) by mouth daily. Patient not taking: Reported on 07/29/2018 07/27/18 07/27/19  Murlean Iba, MD  lipase/protease/amylase (CREON) 36000 UNITS CPEP capsule Take 2 capsules (72,000 Units total) by mouth 3 (three) times daily with meals. Patient not taking: Reported on 07/29/2018 07/27/18   Murlean Iba, MD  omeprazole (PRILOSEC) 40 MG capsule Take 1 capsule (40 mg total) by mouth daily. 07/27/18   Johnson, Clanford L, MD  thiamine (VITAMIN B-1) 100 MG tablet Take 1 tablet (100 mg total) by mouth daily. 07/27/18   Murlean Iba, MD    Family History Family History  Problem Relation Age of Onset   Cancer Other     Social History Social History   Tobacco Use   Smoking status: Current Every Day Smoker    Packs/day: 1.00    Types: Cigarettes   Smokeless tobacco: Never Used  Substance Use Topics   Alcohol use: Yes  Comment: last large amount of alcohol last night 2-18/20   Drug use: Not Currently    Types: Marijuana    Comment: occasionally     Allergies   Patient has no known allergies.   Review of Systems Review of Systems  Constitutional: Positive for activity change and appetite change. Negative for fever.  HENT: Negative for congestion and rhinorrhea.   Eyes: Negative for visual disturbance.  Respiratory: Negative for cough, chest tightness and shortness of breath.   Cardiovascular: Negative for chest pain.  Gastrointestinal: Positive for abdominal pain, nausea and vomiting. Negative for diarrhea.  Genitourinary: Negative  for dysuria and hematuria.  Musculoskeletal: Positive for back pain. Negative for arthralgias and myalgias.  Skin: Negative for rash.  Neurological: Negative for dizziness, weakness and headaches.    all other systems are negative except as noted in the HPI and PMH.    Physical Exam Updated Vital Signs Pulse (!) 115    Temp 98.2 F (36.8 C) (Oral)    Resp 20    Ht 6' (1.829 m)    Wt 77.1 kg    SpO2 100%    BMI 23.06 kg/m   Physical Exam Vitals signs and nursing note reviewed.  Constitutional:      General: He is in acute distress.     Appearance: He is well-developed.     Comments: Standing at side of bed, bent over in pain  HENT:     Head: Normocephalic and atraumatic.     Mouth/Throat:     Pharynx: No oropharyngeal exudate.  Eyes:     Conjunctiva/sclera: Conjunctivae normal.     Pupils: Pupils are equal, round, and reactive to light.  Neck:     Musculoskeletal: Normal range of motion and neck supple.     Comments: No meningismus. Cardiovascular:     Rate and Rhythm: Normal rate and regular rhythm.     Heart sounds: Normal heart sounds. No murmur.  Pulmonary:     Effort: Pulmonary effort is normal. No respiratory distress.     Breath sounds: Normal breath sounds.  Abdominal:     Palpations: Abdomen is soft.     Tenderness: There is abdominal tenderness. There is guarding. There is no rebound.     Comments: Epigastric tenderness with voluntary guarding, abdomen soft, no lower abdominal tenderness  Musculoskeletal: Normal range of motion.        General: No tenderness.     Comments: No CVA tenderness  Skin:    General: Skin is warm.     Capillary Refill: Capillary refill takes less than 2 seconds.  Neurological:     General: No focal deficit present.     Mental Status: He is alert and oriented to person, place, and time. Mental status is at baseline.     Cranial Nerves: No cranial nerve deficit.     Motor: No abnormal muscle tone.     Coordination: Coordination  normal.     Comments: No ataxia on finger to nose bilaterally. No pronator drift. 5/5 strength throughout. CN 2-12 intact.Equal grip strength. Sensation intact.   Psychiatric:        Behavior: Behavior normal.      ED Treatments / Results  Labs (all labs ordered are listed, but only abnormal results are displayed) Labs Reviewed  CBC WITH DIFFERENTIAL/PLATELET - Abnormal; Notable for the following components:      Result Value   WBC 17.3 (*)    RBC 3.81 (*)    Hemoglobin 9.2 (*)  HCT 29.0 (*)    MCV 76.1 (*)    MCH 24.1 (*)    RDW 16.1 (*)    Platelets 467 (*)    Neutro Abs 13.4 (*)    Monocytes Absolute 1.2 (*)    Abs Immature Granulocytes 0.09 (*)    All other components within normal limits  COMPREHENSIVE METABOLIC PANEL - Abnormal; Notable for the following components:   Sodium 134 (*)    Potassium 2.5 (*)    Chloride 86 (*)    Glucose, Bld 180 (*)    BUN 24 (*)    Anion gap 17 (*)    All other components within normal limits  LIPASE, BLOOD  MAGNESIUM  URINALYSIS, ROUTINE W REFLEX MICROSCOPIC  RAPID URINE DRUG SCREEN, HOSP PERFORMED    EKG None  Radiology Ct Abdomen Pelvis W Contrast  Result Date: 03/18/2019 CLINICAL DATA:  Emesis and vomiting for 4 days EXAM: CT ABDOMEN AND PELVIS WITH CONTRAST TECHNIQUE: Multidetector CT imaging of the abdomen and pelvis was performed using the standard protocol following bolus administration of intravenous contrast. CONTRAST:  149mL OMNIPAQUE IOHEXOL 300 MG/ML  SOLN COMPARISON:  CT 07/26/2018 FINDINGS: Lower chest: Dependent atelectasis. Lung bases are otherwise clear. Normal heart size. No pericardial effusion. Hepatobiliary: No focal liver abnormality is seen. No gallstones, gallbladder wall thickening, or biliary dilatation. 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 Largest fluid attenuation cystic lesion measures up to 1.8 cm at  the pancreatic head neck junction. Note definitive connection to the main pancreatic duct. No pancreatic ductal dilatation or peripancreatic inflammation is present at this time. Spleen: Normal in size without focal abnormality. Adrenals/Urinary Tract: Normal adrenal glands. Few fluid attenuation cysts in the kidneys are unchanged from prior. No concerning renal lesions. No hydronephrosis or urolithiasis. Normal urinary bladder. Stomach/Bowel: Marked distal esophageal thickening and mucosal hyperemia with some adjacent reactive free fluid in the lower mediastinum. The stomach is markedly distended with ingested material. Duodenal sweep takes a normal anatomic course. No small bowel dilatation or wall thickening. A normal appendix is visualized. No colonic dilatation or wall thickening. Vascular/Lymphatic: The aorta is normal caliber. Few reactive upper abdominal lymph nodes. No pathologically enlarged abdominopelvic nodes. Reproductive: The prostate and seminal vesicles are unremarkable. Other: No abdominopelvic free fluid or free gas. No bowel containing hernias. Musculoskeletal: No acute osseous abnormality or suspicious osseous lesion. IMPRESSION: 1. Marked distal esophageal thickening and mucosal hyperemia with some adjacent reactive free fluid in the lower mediastinum, consistent with esophagitis. 2. Markedly distended stomach with ingested material. 3. 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 No pancreatic ductal dilatation or peripancreatic inflammation at this time. The largest cystic pancreatic lesion measuring 1.8 cm assess detailed six-month follow-up imaging for at least 2 years of stability. This recommendation follows ACR consensus guidelines: Management of Incidental Pancreatic Cysts: A White Paper of the ACR Incidental Findings Committee. [place] [id number]. Electronically Signed   By: Lovena Le  M.D.   On: 03/18/2019 06:13    Procedures Procedures (including critical care time)  Medications Ordered in ED Medications  sodium chloride 0.9 % bolus 1,000 mL (has no administration in time range)  ondansetron (ZOFRAN) injection 4 mg (has no administration in time range)  HYDROmorphone (DILAUDID) injection 1 mg (has no administration in time range)  sodium chloride 0.9 % bolus 1,000 mL (has no administration in time range)     Initial Impression /  Assessment and Plan / ED Course  I have reviewed the triage vital signs and the nursing notes.  Pertinent labs & imaging results that were available during my care of the patient were reviewed by me and considered in my medical decision making (see chart for details).       Upper abdominal pain with nausea and vomiting similar to previous episodes of pancreatitis.  Patient given IV fluids and symptom control.  N.p.o.  Labs show normal lipase and LFTs. Hypokalemia of 2.5. Hemoglobin 9.2 from 10-11.. Denies any blood in emesis or dark stools. Will check FOBT.  CT shows no evidence of pancreatitis but does show esophagitis.  Patient made aware of pancreatic cysts and need for followup.   Potassium aggressively replaced. No vomiting in the.  D/w patient need to restart PPI, carafate. Avoid alcohol, NSAIDs, caffeine, spicy foods.  He has never had an EGD. Will refer back to GI.  Tolerating PO. Will need reassessment after KCL complete.  Dr. Laverta Baltimore to assume care at shift change.  Final Clinical Impressions(s) / ED Diagnoses   Final diagnoses:  Esophagitis  Hypokalemia    ED Discharge Orders    None       Keriana Sarsfield, Annie Main, MD 03/18/19 [phone]

## 2019-03-18 NOTE — ED Triage Notes (Signed)
Pt brought in by [facility] for c/o pain; pt states he started vomiting today and has had abdominal pain x 4 days; pt denies any alcohol

## 2019-03-30 ENCOUNTER — Other Ambulatory Visit: Payer: Self-pay

## 2019-03-30 ENCOUNTER — Encounter (HOSPITAL_COMMUNITY): Payer: Self-pay | Admitting: *Deleted

## 2019-03-30 ENCOUNTER — Emergency Department (HOSPITAL_COMMUNITY): Payer: Self-pay

## 2019-03-30 ENCOUNTER — Inpatient Hospital Stay (HOSPITAL_COMMUNITY)
Admission: EM | Admit: 2019-03-30 | Discharge: 2019-04-08 | DRG: 326 | Disposition: A | Discharge status: Home / Self Care | Payer: Self-pay | Attending: Family Medicine | Admitting: Family Medicine

## 2019-03-30 DIAGNOSIS — D62 Acute posthemorrhagic anemia: Secondary | ICD-10-CM | POA: Diagnosis present

## 2019-03-30 DIAGNOSIS — K311 Adult hypertrophic pyloric stenosis: Secondary | ICD-10-CM | POA: Diagnosis present

## 2019-03-30 DIAGNOSIS — R1115 Cyclical vomiting syndrome unrelated to migraine: Secondary | ICD-10-CM | POA: Diagnosis present

## 2019-03-30 DIAGNOSIS — R109 Unspecified abdominal pain: Secondary | ICD-10-CM

## 2019-03-30 DIAGNOSIS — K852 Alcohol induced acute pancreatitis without necrosis or infection: Secondary | ICD-10-CM | POA: Diagnosis present

## 2019-03-30 DIAGNOSIS — K922 Gastrointestinal hemorrhage, unspecified: Secondary | ICD-10-CM | POA: Diagnosis present

## 2019-03-30 DIAGNOSIS — I10 Essential (primary) hypertension: Secondary | ICD-10-CM | POA: Diagnosis present

## 2019-03-30 DIAGNOSIS — Z539 Procedure and treatment not carried out, unspecified reason: Secondary | ICD-10-CM | POA: Diagnosis not present

## 2019-03-30 DIAGNOSIS — F1210 Cannabis abuse, uncomplicated: Secondary | ICD-10-CM | POA: Diagnosis present

## 2019-03-30 DIAGNOSIS — K862 Cyst of pancreas: Secondary | ICD-10-CM | POA: Diagnosis present

## 2019-03-30 DIAGNOSIS — F1010 Alcohol abuse, uncomplicated: Secondary | ICD-10-CM | POA: Diagnosis present

## 2019-03-30 DIAGNOSIS — F17210 Nicotine dependence, cigarettes, uncomplicated: Secondary | ICD-10-CM | POA: Diagnosis present

## 2019-03-30 DIAGNOSIS — Z79899 Other long term (current) drug therapy: Secondary | ICD-10-CM

## 2019-03-30 DIAGNOSIS — K2211 Ulcer of esophagus with bleeding: Principal | ICD-10-CM | POA: Diagnosis present

## 2019-03-30 DIAGNOSIS — Z720 Tobacco use: Secondary | ICD-10-CM | POA: Diagnosis present

## 2019-03-30 DIAGNOSIS — Z20828 Contact with and (suspected) exposure to other viral communicable diseases: Secondary | ICD-10-CM | POA: Diagnosis present

## 2019-03-30 DIAGNOSIS — K2101 Gastro-esophageal reflux disease with esophagitis, with bleeding: Secondary | ICD-10-CM | POA: Diagnosis present

## 2019-03-30 DIAGNOSIS — K760 Fatty (change of) liver, not elsewhere classified: Secondary | ICD-10-CM | POA: Diagnosis present

## 2019-03-30 DIAGNOSIS — K863 Pseudocyst of pancreas: Secondary | ICD-10-CM | POA: Diagnosis present

## 2019-03-30 DIAGNOSIS — R112 Nausea with vomiting, unspecified: Secondary | ICD-10-CM

## 2019-03-30 DIAGNOSIS — K209 Esophagitis, unspecified without bleeding: Secondary | ICD-10-CM

## 2019-03-30 DIAGNOSIS — D509 Iron deficiency anemia, unspecified: Secondary | ICD-10-CM | POA: Diagnosis present

## 2019-03-30 HISTORY — DX: Gastro-esophageal reflux disease without esophagitis: K21.9

## 2019-03-30 LAB — URINALYSIS, ROUTINE W REFLEX MICROSCOPIC
Bilirubin Urine: NEGATIVE
Glucose, UA: NEGATIVE mg/dL
Hgb urine dipstick: NEGATIVE
Ketones, ur: NEGATIVE mg/dL
Leukocytes,Ua: NEGATIVE
Nitrite: NEGATIVE
Protein, ur: 30 mg/dL — AB
Specific Gravity, Urine: 1.018 (ref 1.005–1.030)
pH: 9 — ABNORMAL HIGH (ref 5.0–8.0)

## 2019-03-30 LAB — RAPID URINE DRUG SCREEN, HOSP PERFORMED
Amphetamines: NOT DETECTED
Barbiturates: NOT DETECTED
Benzodiazepines: NOT DETECTED
Cocaine: NOT DETECTED
Opiates: POSITIVE — AB
Tetrahydrocannabinol: POSITIVE — AB

## 2019-03-30 LAB — CBC WITH DIFFERENTIAL/PLATELET
Abs Immature Granulocytes: 0.03 10*3/uL (ref 0.00–0.07)
Basophils Absolute: 0 10*3/uL (ref 0.0–0.1)
Basophils Relative: 0 %
Eosinophils Absolute: 0.1 10*3/uL (ref 0.0–0.5)
Eosinophils Relative: 1 %
HCT: 26.3 % — ABNORMAL LOW (ref 39.0–52.0)
Hemoglobin: 8 g/dL — ABNORMAL LOW (ref 13.0–17.0)
Immature Granulocytes: 0 %
Lymphocytes Relative: 13 %
Lymphs Abs: 1.3 10*3/uL (ref 0.7–4.0)
MCH: 22.2 pg — ABNORMAL LOW (ref 26.0–34.0)
MCHC: 30.4 g/dL (ref 30.0–36.0)
MCV: 72.9 fL — ABNORMAL LOW (ref 80.0–100.0)
Monocytes Absolute: 0.9 10*3/uL (ref 0.1–1.0)
Monocytes Relative: 9 %
Neutro Abs: 7.6 10*3/uL (ref 1.7–7.7)
Neutrophils Relative %: 77 %
Platelets: 1152 10*3/uL (ref 150–400)
RBC: 3.61 MIL/uL — ABNORMAL LOW (ref 4.22–5.81)
RDW: 17.4 % — ABNORMAL HIGH (ref 11.5–15.5)
WBC: 10 10*3/uL (ref 4.0–10.5)
nRBC: 0 % (ref 0.0–0.2)

## 2019-03-30 LAB — COMPREHENSIVE METABOLIC PANEL
ALT: 12 U/L (ref 0–44)
AST: 20 U/L (ref 15–41)
Albumin: 4 g/dL (ref 3.5–5.0)
Alkaline Phosphatase: 84 U/L (ref 38–126)
Anion gap: 18 — ABNORMAL HIGH (ref 5–15)
BUN: 16 mg/dL (ref 6–20)
CO2: 33 mmol/L — ABNORMAL HIGH (ref 22–32)
Calcium: 10.1 mg/dL (ref 8.9–10.3)
Chloride: 88 mmol/L — ABNORMAL LOW (ref 98–111)
Creatinine, Ser: 1.07 mg/dL (ref 0.61–1.24)
GFR calc Af Amer: >60 mL/min (ref >60)
GFR calc non Af Amer: >60 mL/min (ref >60)
Glucose, Bld: 195 mg/dL — ABNORMAL HIGH (ref 70–99)
Potassium: 2.7 mmol/L — CL (ref 3.5–5.1)
Sodium: 139 mmol/L (ref 135–145)
Total Bilirubin: 0.5 mg/dL (ref 0.3–1.2)
Total Protein: 8 g/dL (ref 6.5–8.1)

## 2019-03-30 LAB — PREPARE RBC (CROSSMATCH)

## 2019-03-30 LAB — CBC
HCT: 21.1 % — ABNORMAL LOW (ref 39.0–52.0)
Hemoglobin: 6.2 g/dL — CL (ref 13.0–17.0)
MCH: 21.8 pg — ABNORMAL LOW (ref 26.0–34.0)
MCHC: 29.4 g/dL — ABNORMAL LOW (ref 30.0–36.0)
MCV: 74.3 fL — ABNORMAL LOW (ref 80.0–100.0)
Platelets: 883 10*3/uL — ABNORMAL HIGH (ref 150–400)
RBC: 2.84 MIL/uL — ABNORMAL LOW (ref 4.22–5.81)
RDW: 18 % — ABNORMAL HIGH (ref 11.5–15.5)
WBC: 9.2 10*3/uL (ref 4.0–10.5)
nRBC: 0 % (ref 0.0–0.2)

## 2019-03-30 LAB — OCCULT BLOOD X 1 CARD TO LAB, STOOL: Fecal Occult Bld: NEGATIVE

## 2019-03-30 LAB — MAGNESIUM: Magnesium: 2 mg/dL (ref 1.7–2.4)

## 2019-03-30 LAB — ABO/RH: ABO/RH(D): B NEG

## 2019-03-30 LAB — LIPASE, BLOOD: Lipase: 159 U/L — ABNORMAL HIGH (ref 11–51)

## 2019-03-30 LAB — SARS CORONAVIRUS 2 (TAT 6-24 HRS): SARS Coronavirus 2: NEGATIVE

## 2019-03-30 MED ORDER — ACETAMINOPHEN 650 MG RE SUPP: 650.0000 mg | Freq: Four times a day (QID) | RECTAL | Status: DC | PRN

## 2019-03-30 MED ORDER — SODIUM CHLORIDE 0.9 % IV SOLN
80.0000 mg | Freq: Once | INTRAVENOUS | Status: AC
  Administered 2019-03-30: 80 mg via INTRAVENOUS
  Filled 2019-03-30: qty 80

## 2019-03-30 MED ORDER — TRAZODONE HCL 50 MG PO TABS
100.0000 mg | ORAL_TABLET | Freq: Every day | ORAL | Status: DC
  Administered 2019-03-30 – 2019-04-07 (×8): 100 mg via ORAL
  Filled 2019-03-30 (×9): qty 2

## 2019-03-30 MED ORDER — SODIUM CHLORIDE 0.9 % IV BOLUS: 1000.0000 mL | Freq: Once | INTRAVENOUS | Status: DC

## 2019-03-30 MED ORDER — ONDANSETRON HCL 4 MG PO TABS
4.0000 mg | ORAL_TABLET | Freq: Four times a day (QID) | ORAL | Status: DC | PRN
  Administered 2019-04-07: 4 mg via ORAL
  Filled 2019-03-30: qty 1

## 2019-03-30 MED ORDER — POTASSIUM CHLORIDE IN NACL 40-0.9 MEQ/L-% IV SOLN
INTRAVENOUS | Status: DC
  Administered 2019-03-30 (×2): 125 mL/h via INTRAVENOUS
  Filled 2019-03-30 (×4): qty 1000

## 2019-03-30 MED ORDER — SODIUM CHLORIDE 0.9 % IV SOLN
8.0000 mg/h | INTRAVENOUS | Status: DC
  Administered 2019-03-31 – 2019-04-01 (×3): 8 mg/h via INTRAVENOUS
  Filled 2019-03-30 (×7): qty 80

## 2019-03-30 MED ORDER — POTASSIUM CHLORIDE 10 MEQ/100ML IV SOLN
10.0000 meq | Freq: Once | INTRAVENOUS | Status: AC
  Administered 2019-03-30: 09:00:00 10 meq via INTRAVENOUS
  Filled 2019-03-30: qty 100

## 2019-03-30 MED ORDER — SODIUM CHLORIDE 0.9 % IV SOLN
Freq: Once | INTRAVENOUS | Status: AC
  Administered 2019-03-30: 11:00:00 via INTRAVENOUS

## 2019-03-30 MED ORDER — ONDANSETRON HCL 4 MG/2ML IJ SOLN
4.0000 mg | Freq: Once | INTRAMUSCULAR | Status: AC
  Administered 2019-03-30: 4 mg via INTRAVENOUS
  Filled 2019-03-30: qty 2

## 2019-03-30 MED ORDER — SODIUM CHLORIDE 0.9% FLUSH
3.0000 mL | Freq: Two times a day (BID) | INTRAVENOUS | Status: DC
  Administered 2019-03-30 – 2019-04-06 (×8): 3 mL via INTRAVENOUS

## 2019-03-30 MED ORDER — SODIUM CHLORIDE 0.9 % IV SOLN: INTRAVENOUS | Status: DC

## 2019-03-30 MED ORDER — LORAZEPAM 2 MG/ML IJ SOLN: 0.0000 mg | Freq: Two times a day (BID) | INTRAMUSCULAR | Status: DC

## 2019-03-30 MED ORDER — SODIUM CHLORIDE 0.9% FLUSH
3.0000 mL | INTRAVENOUS | Status: DC | PRN
  Administered 2019-04-02: 3 mL via INTRAVENOUS
  Filled 2019-03-30: qty 3

## 2019-03-30 MED ORDER — ONDANSETRON HCL 4 MG/2ML IJ SOLN: 4.0000 mg | Freq: Four times a day (QID) | INTRAMUSCULAR | Status: DC | PRN

## 2019-03-30 MED ORDER — FOLIC ACID 1 MG PO TABS: 1.0000 mg | ORAL_TABLET | Freq: Every day | ORAL | Status: DC

## 2019-03-30 MED ORDER — HYDROMORPHONE HCL 1 MG/ML IJ SOLN
1.0000 mg | Freq: Once | INTRAMUSCULAR | Status: AC
  Administered 2019-03-30: 1 mg via INTRAVENOUS
  Filled 2019-03-30: qty 1

## 2019-03-30 MED ORDER — SODIUM CHLORIDE 0.9% IV SOLUTION: Freq: Once | INTRAVENOUS | Status: DC

## 2019-03-30 MED ORDER — MAGNESIUM SULFATE 2 GM/50ML IV SOLN
2.0000 g | INTRAVENOUS | Status: AC
  Administered 2019-03-30: 09:00:00 2 g via INTRAVENOUS
  Filled 2019-03-30: qty 50

## 2019-03-30 MED ORDER — ACETAMINOPHEN 325 MG PO TABS: 650.0000 mg | ORAL_TABLET | Freq: Four times a day (QID) | ORAL | Status: DC | PRN

## 2019-03-30 MED ORDER — PANTOPRAZOLE SODIUM 40 MG IV SOLR
INTRAVENOUS | Status: AC
  Filled 2019-03-30: qty 160

## 2019-03-30 MED ORDER — VITAMIN B-1 100 MG PO TABS: 100.0000 mg | ORAL_TABLET | Freq: Every day | ORAL | Status: DC

## 2019-03-30 MED ORDER — POLYETHYLENE GLYCOL 3350 17 G PO PACK: 17.0000 g | PACK | Freq: Every day | ORAL | Status: DC | PRN

## 2019-03-30 MED ORDER — SENNOSIDES-DOCUSATE SODIUM 8.6-50 MG PO TABS
2.0000 | ORAL_TABLET | Freq: Two times a day (BID) | ORAL | Status: DC
  Administered 2019-03-30 – 2019-04-01 (×5): 2 via ORAL
  Filled 2019-03-30 (×5): qty 2

## 2019-03-30 MED ORDER — LORAZEPAM 2 MG/ML IJ SOLN
1.0000 mg | INTRAMUSCULAR | Status: AC | PRN
  Administered 2019-03-31 – 2019-04-02 (×2): 1 mg via INTRAVENOUS
  Filled 2019-03-30: qty 1

## 2019-03-30 MED ORDER — SUCRALFATE 1 G PO TABS
1.0000 g | ORAL_TABLET | Freq: Three times a day (TID) | ORAL | Status: DC
  Administered 2019-03-30 – 2019-04-01 (×7): 1 g via ORAL
  Filled 2019-03-30 (×7): qty 1

## 2019-03-30 MED ORDER — TRAZODONE HCL 50 MG PO TABS: 50.0000 mg | ORAL_TABLET | Freq: Every evening | ORAL | Status: DC | PRN

## 2019-03-30 MED ORDER — BISACODYL 10 MG RE SUPP
10.0000 mg | Freq: Once | RECTAL | Status: AC
  Administered 2019-03-30: 10 mg via RECTAL
  Filled 2019-03-30 (×2): qty 1

## 2019-03-30 MED ORDER — SODIUM CHLORIDE 0.9 % IV SOLN: 250.0000 mL | INTRAVENOUS | Status: DC | PRN

## 2019-03-30 MED ORDER — LORAZEPAM 2 MG/ML IJ SOLN
0.0000 mg | Freq: Four times a day (QID) | INTRAMUSCULAR | Status: AC
  Administered 2019-03-30 – 2019-04-01 (×5): 1 mg via INTRAVENOUS
  Filled 2019-03-30 (×6): qty 1

## 2019-03-30 MED ORDER — PANTOPRAZOLE SODIUM 40 MG IV SOLR
40.0000 mg | Freq: Once | INTRAVENOUS | Status: AC
  Administered 2019-03-30: 40 mg via INTRAVENOUS
  Filled 2019-03-30: qty 40

## 2019-03-30 MED ORDER — ALUM & MAG HYDROXIDE-SIMETH 200-200-20 MG/5ML PO SUSP
30.0000 mL | Freq: Once | ORAL | Status: AC
  Administered 2019-03-30: 30 mL via ORAL
  Filled 2019-03-30: qty 30

## 2019-03-30 MED ORDER — PANTOPRAZOLE SODIUM 40 MG IV SOLR: 40.0000 mg | Freq: Two times a day (BID) | INTRAVENOUS | Status: DC

## 2019-03-30 MED ORDER — SODIUM CHLORIDE 0.9 % IV BOLUS
1000.0000 mL | Freq: Once | INTRAVENOUS | Status: AC
  Administered 2019-03-30: 1000 mL via INTRAVENOUS

## 2019-03-30 MED ORDER — ALBUTEROL SULFATE (2.5 MG/3ML) 0.083% IN NEBU: 2.5000 mg | INHALATION_SOLUTION | RESPIRATORY_TRACT | Status: DC | PRN

## 2019-03-30 MED ORDER — FUROSEMIDE 10 MG/ML IJ SOLN
20.0000 mg | Freq: Once | INTRAMUSCULAR | Status: AC
  Administered 2019-03-30: 20 mg via INTRAVENOUS
  Filled 2019-03-30: qty 2

## 2019-03-30 MED ORDER — LORAZEPAM 1 MG PO TABS: 1.0000 mg | ORAL_TABLET | ORAL | Status: AC | PRN

## 2019-03-30 MED ORDER — THIAMINE HCL 100 MG/ML IJ SOLN
100.0000 mg | Freq: Every day | INTRAMUSCULAR | Status: DC
  Administered 2019-04-03 – 2019-04-08 (×6): 100 mg via INTRAVENOUS
  Filled 2019-03-30 (×6): qty 2

## 2019-03-30 MED ORDER — HYDROMORPHONE HCL 1 MG/ML IJ SOLN
1.0000 mg | INTRAMUSCULAR | Status: DC | PRN
  Administered 2019-03-30 – 2019-04-03 (×18): 1 mg via INTRAVENOUS
  Filled 2019-03-30 (×18): qty 1

## 2019-03-30 MED ORDER — PANCRELIPASE (LIP-PROT-AMYL) 12000-38000 UNITS PO CPEP
72000.0000 [IU] | ORAL_CAPSULE | Freq: Three times a day (TID) | ORAL | Status: DC
  Administered 2019-04-01 – 2019-04-02 (×3): 72000 [IU] via ORAL
  Filled 2019-03-30 (×3): qty 6

## 2019-03-30 MED ORDER — VITAMIN B-1 100 MG PO TABS
100.0000 mg | ORAL_TABLET | Freq: Every day | ORAL | Status: DC
  Administered 2019-03-30 – 2019-04-01 (×3): 100 mg via ORAL
  Filled 2019-03-30 (×3): qty 1

## 2019-03-30 MED ORDER — LIDOCAINE VISCOUS HCL 2 % MT SOLN
15.0000 mL | Freq: Once | OROMUCOSAL | Status: AC
  Administered 2019-03-30: 15 mL via ORAL
  Filled 2019-03-30: qty 15

## 2019-03-30 MED ORDER — POLYETHYLENE GLYCOL 3350 17 G PO PACK
17.0000 g | PACK | Freq: Every day | ORAL | Status: DC
  Administered 2019-03-30 – 2019-04-01 (×3): 17 g via ORAL
  Filled 2019-03-30 (×3): qty 1

## 2019-03-30 MED ORDER — ADULT MULTIVITAMIN W/MINERALS CH
1.0000 | ORAL_TABLET | Freq: Every day | ORAL | Status: DC
  Administered 2019-03-30 – 2019-04-01 (×3): 1 via ORAL
  Filled 2019-03-30 (×3): qty 1

## 2019-03-30 MED ORDER — FOLIC ACID 1 MG PO TABS
1.0000 mg | ORAL_TABLET | Freq: Every day | ORAL | Status: DC
  Administered 2019-03-30 – 2019-04-01 (×3): 1 mg via ORAL
  Filled 2019-03-30 (×3): qty 1

## 2019-03-30 NOTE — ED Notes (Signed)
CRITICAL VALUE ALERT  Critical Value:  Potassium 2.7  Date & Time Notied: 03/30/2019 @ 0840  Provider Notified: Dr Sabra Heck  Orders Received/Actions taken: to be given

## 2019-03-30 NOTE — ED Provider Notes (Signed)
[facility] EMERGENCY DEPARTMENT Provider Note   CSN: [id number] Arrival date & time: 03/30/19  [id number]     History   Chief Complaint Chief Complaint  Patient presents with  . Abdominal Pain    HPI Laszlo Fornwalt is a 33 y.o. male.     Patient with history of alcohol abuse, previous episode of pancreatitis, acid reflux disease, tobacco abuse presenting with upper abdominal pain with nausea and vomiting.  He was seen for similar episode on October 13 and discharged with PPI and potassium supplementation.  He states he "never got better".  The pain became more severe over the past 3 days associate multiple episodes of emesis.  He states the emesis "looks like Coca-Cola" because he has been drinking Coca-Cola.  He denies any alcohol or illicit drug use.  He denies any dark or bloody stools.  He states he is not had any alcohol since his last in the ED.  Admits to marijuana use but no other drugs.  Does not drink alcohol on a regular basis.  No previous abdominal surgeries.  No chest pain or shortness of breath.  States his been taking his omeprazole.  No fever.  He is never had an EGD or colonoscopy.  He did not follow-up with PCP or gastroenterology since he was discharged.  He states he has no insurance.  He was discharged with PPI and Carafate.  The history is provided by the patient.    Past Medical History:  Diagnosis Date  . Pancreatitis     Patient Active Problem List   Diagnosis Date Noted  . Acute necrotizing pancreatitis 07/24/2018  . Cannabis abuse 03/27/2018  . Acute kidney injury ([place])   . Hypomagnesemia   . Acute on chronic pancreatitis ([place]) 03/26/2018  . GERD (gastroesophageal reflux disease) 03/26/2018  . Acute renal failure (ARF) ([place]) 03/26/2018  . Hypokalemia 02/27/2018  . Essential hypertension 02/27/2018  . Tobacco abuse 02/27/2018  . Acute pancreatitis 02/27/2018  . Constipation   . Annular pancreas   . Pancreatitis   . Non-intractable vomiting with  nausea   . Hepatic steatosis   . 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 Leukocytosis 12/27/2017  . Abdominal pain, epigastric 12/27/2017  . Acute alcoholic pancreatitis 0000000  . Alcohol abuse 12/26/2017    Past Surgical History:  Procedure Laterality Date  . NO PAST SURGERIES          Home Medications    Prior to Admission medications   Medication Sig Start Date End Date Taking? Authorizing Provider  folic acid (FOLVITE) 1 MG tablet Take 1 tablet (1 mg total) by mouth daily. Patient not taking: Reported on 07/29/2018 07/27/18 07/27/19  Murlean Iba, MD  lipase/protease/amylase (CREON) 36000 UNITS CPEP capsule Take 2 capsules (72,000 Units total) by mouth 3 (three) times daily with meals. Patient not taking: Reported on 07/29/2018 07/27/18   Murlean Iba, MD  omeprazole (PRILOSEC) 40 MG capsule Take 1 capsule (40 mg total) by mouth daily. 03/18/19   Javeah Loeza, Annie Main, MD  ondansetron (ZOFRAN ODT) 4 MG disintegrating tablet Take 1 tablet (4 mg total) by mouth every 8 (eight) hours as needed for nausea or vomiting. 03/18/19   Judea Fennimore, Annie Main, MD  potassium chloride SA (KLOR-CON) 20 MEQ tablet Take 1 tablet (20 mEq total) by mouth 2 (two) times daily. 03/18/19   Trudie Cervantes, Annie Main, MD  sucralfate (CARAFATE) 1 g tablet Take 1 tablet (1 g total) by mouth 4 (four) times daily -  with meals and at bedtime. 03/18/19  Alishea Beaudin, Annie Main, MD  thiamine (VITAMIN B-1) 100 MG tablet Take 1 tablet (100 mg total) by mouth daily. 07/27/18   Murlean Iba, MD    Family History Family History  Problem Relation Age of Onset  . Cancer Other     Social History Social History   Tobacco Use  . Smoking status: Current Every Day Smoker    Packs/day: 1.00    Types: Cigarettes  . Smokeless tobacco: Never Used  Substance Use Topics  . Alcohol use: Yes    Comment: last large amount of alcohol last night 2-18/20  . Drug use: Not Currently    Types: Marijuana    Comment:  occasionally     Allergies   Patient has no known allergies.   Review of Systems Review of Systems  Constitutional: Positive for activity change and appetite change. Negative for fever.  HENT: Negative for congestion and rhinorrhea.   Eyes: Negative for visual disturbance.  Respiratory: Negative for cough, chest tightness and shortness of breath.   Cardiovascular: Negative for chest pain.  Gastrointestinal: Positive for abdominal pain, nausea and vomiting. Negative for blood in stool.  Genitourinary: Negative for dysuria and hematuria.  Musculoskeletal: Negative for back pain.  Neurological: Negative for dizziness, weakness and headaches.    all other systems are negative except as noted in the HPI and PMH.    Physical Exam Updated Vital Signs BP 132/84   Pulse (!) 119   Temp 97.8 F (36.6 C) (Oral)   Resp 20   Ht 6' (1.829 m)   Wt 77.1 kg   SpO2 100%   BMI 23.06 kg/m   Physical Exam Vitals signs and nursing note reviewed.  Constitutional:      General: He is not in acute distress.    Appearance: He is well-developed.     Comments: Uncomfortable, pale appearing  HENT:     Head: Normocephalic and atraumatic.     Mouth/Throat:     Pharynx: No oropharyngeal exudate.  Eyes:     Conjunctiva/sclera: Conjunctivae normal.     Pupils: Pupils are equal, round, and reactive to light.     Comments: Conjunctival pallor  Neck:     Musculoskeletal: Normal range of motion and neck supple.     Comments: No meningismus. Cardiovascular:     Rate and Rhythm: Regular rhythm. Tachycardia present.     Heart sounds: Normal heart sounds. No murmur.  Pulmonary:     Effort: Respiratory distress present.     Breath sounds: Normal breath sounds.  Abdominal:     Palpations: Abdomen is soft.     Tenderness: There is abdominal tenderness. There is guarding. There is no rebound.     Comments: Epigastric tenderness with voluntary guarding, no lower abdominal tenderness  Musculoskeletal:  Normal range of motion.        General: No tenderness.  Skin:    General: Skin is warm.     Comments: Multiple tattoos  Neurological:     Mental Status: He is alert and oriented to person, place, and time.     Cranial Nerves: No cranial nerve deficit.     Motor: No abnormal muscle tone.     Coordination: Coordination normal.     Comments:  5/5 strength throughout. CN 2-12 intact.Equal grip strength.   Psychiatric:        Behavior: Behavior normal.      ED Treatments / Results  Labs (all labs ordered are listed, but only abnormal results are displayed) Labs  Reviewed  CBC WITH DIFFERENTIAL/PLATELET  COMPREHENSIVE METABOLIC PANEL  LIPASE, BLOOD  RAPID URINE DRUG SCREEN, HOSP PERFORMED  URINALYSIS, ROUTINE W REFLEX MICROSCOPIC  TYPE AND SCREEN    EKG None  Radiology No results found.  Procedures Procedures (including critical care time)  Medications Ordered in ED Medications  sodium chloride 0.9 % bolus 1,000 mL (has no administration in time range)  sodium chloride 0.9 % bolus 1,000 mL (has no administration in time range)  pantoprazole (PROTONIX) injection 40 mg (has no administration in time range)  ondansetron (ZOFRAN) injection 4 mg (has no administration in time range)  HYDROmorphone (DILAUDID) injection 1 mg (has no administration in time range)     Initial Impression / Assessment and Plan / ED Course  I have reviewed the triage vital signs and the nursing notes.  Pertinent labs & imaging results that were available during my care of the patient were reviewed by me and considered in my medical decision making (see chart for details).        Upper abdominal pain similar to previous episodes of pancreatitis and esophagitis.  Is tachycardic with a stable blood pressure.  Given IV fluids and symptom control.  CT scan from October 13 reviewed the did show evidence of severe esophagitis but no evidence of acute pancreatitis.  His hemoglobin was downtrending  to 9.2 but he declined Hemoccult at that time.  He again declined Hemoccult today.  Patient given IV fluids as to control.  CT scan reviewed from October 13.  IV PPI given.  Labs pending at time of shift change.  Also obtain type and screen given patient's report of dark emesis though he continues to decline rectal exam.  Anticipate he will need admission today due to his pale appearance and ongoing symptoms.  Final Clinical Impressions(s) / ED Diagnoses   Final diagnoses:  None    ED Discharge Orders    None       Destynee Stringfellow, Annie Main, MD 03/30/19 [id number]

## 2019-03-30 NOTE — ED Notes (Signed)
Pt is aware we need urine sample, urinal at bedside.  

## 2019-03-30 NOTE — ED Provider Notes (Signed)
This patient was accepted at change of shift, he has a history of chronic pancreatitis, he has a history of alcohol induced pancreatitis, he has not had any alcohol in over 10 days per his report but has developed recurrent epigastric and left-sided discomfort.  He has had some nausea and vomiting of what appears to be coffee-ground emesis.  His hemoglobin continues to trend down and is now 8, see trend below  Hemoglobin  Date Value Ref Range Status  03/30/2019 8.0 (L) 13.0 - 17.0 g/dL Final    Comment:    Reticulocyte Hemoglobin testing may be clinically indicated, consider ordering this additional test [id number]   03/18/2019 9.2 (L) 13.0 - 17.0 g/dL Final  07/30/2018 10.6 (L) 13.0 - 17.0 g/dL Final  07/29/2018 13.6 13.0 - 17.0 g/dL Final    The patient denies any changes in his bowel movement colors, he has continued to have pain and vomiting, on exam the patient has mild tachycardia and of note his other laboratory values show that his platelet count is over 1100.  His MCV is at 72.9, his potassium is 2.7, creatinine is normal at 1.07 and BUN is 16.  Today his lipase is 159 again consistent with pancreatitis.  He had a CT scan 10 days ago, I do not think this needs to be repeated, his acute abdominal series has been performed and shows no signs of free air.  He has been given a proton pump inhibitor, pain medications and IV fluids, he will be admitted to the hospital.  QT interval is significantly elevated  At 540 - will give Mag and K and avoid QT prolonging agents going forward.   EKG Interpretation  Date/Time:  Sunday March 30 2019 09:01:25 EDT Ventricular Rate:  96 PR Interval:    QRS Duration: 84 QT Interval:  427 QTC Calculation: 540 R Axis:   68 Text Interpretation:  Sinus rhythm Prolonged QT interval Since last tracing QT has lengthened Confirmed by Noemi Chapel [phone]) on 03/30/2019 9:04:46 AM        Consulted hospitalist at 8:47 AM awaiting return call  Discussed  with Dr. Denton Brick who will admit   Noemi Chapel, MD 03/30/19 [phone]

## 2019-03-30 NOTE — H&P (Signed)
Patient Demographics:    David Miranda, is a 33 y.o. male  MRN: [id number]   DOB - [birth year]  Admit Date - 03/30/2019  Outpatient Primary MD for the patient is Patient, No Pcp Per   Assessment & Plan:    Principal Problem:   Emesis, persistent Active Problems:   Acute alcoholic pancreatitis   Alcohol abuse   Tobacco abuse   Cannabis abuse   Severe Alcoholic Esophagitis with gastritis   Pancreatic pseudocyst/cyst--alcoholic pancreatitis  CT abdomen and pelvis from 03/18/2019 1)Marked distal esophageal thickening and mucosal hyperemia with some adjacent reactive free fluid in the lower mediastinum, consistent with esophagitis. 2. Markedly distended stomach with ingested material. 3. Multiple hypoattenuating cystic foci throughout the pancreaslikely reflect pancreatic pseudocyst/sequela of prior pancreatitis. The largest cystic pancreatic lesion measuring 1.8 cm  A/p 1)Severe Alcoholic Esophagitis and Gastritis--IV Protonix as ordered, discussed with Dr. 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  5) acute on chronic anemia--concerns about possible  GI bleed, especially in the setting of intractable emesis -Hemoglobin is down to 8.0 from 9.2 about 12 days ago, and  from a previous baseline of 11 -Follow H&H and transfuse as clinically indicated -Plan is for EGD on 03/31/2019  With History of - Reviewed by me  Past Medical History:  Diagnosis Date  . Pancreatitis       Past Surgical History:  Procedure Laterality Date  . NO PAST SURGERIES        Chief Complaint  Patient presents with  . Abdominal Pain      HPI:    David Miranda  is a 33 y.o. male alcoholic male was been drinking alcohol in excess since his teenage years presents to the ED with ongoing abdominal pain and intractable emesis in the setting of THC and alcohol abuse -Patient was seen in the ED on 03/18/2019 with similar findings, he was discharged with Carafate and PPI.  Symptoms have persisted -Patient states he has not had an alcoholic intake in close to 10 days -Additional history obtained from his wife at bedside - -Patient has been having dark emesis but he has been drinking Coca-Cola, no melena or hematochezia, no bright red blood per rectum - CT abdomen and pelvis from 03/18/2019 1)Marked distal esophageal thickening and mucosal hyperemia with some adjacent reactive free fluid in the lower  mediastinum, consistent with esophagitis. 2. Markedly distended stomach with ingested material. 3. Multiple hypoattenuating cystic foci throughout the pancreaslikely reflect pancreatic pseudocyst/sequela of prior pancreatitis. The largest cystic pancreatic lesion measuring 1.8 cm  -Abdominal x-rays today without acute findings, there is concern about constipation -Lipase is up to 159 -Potassium is low at 2.7, platelets over 1100, hemoglobin is down to 8 -LFTs are not elevated Hemoglobin is down to 8.0 from 9.2 about 12 days ago, and  from a previous baseline of 11   Review of systems:    In addition to the HPI above,   A full Review of  Systems was done, all  other systems reviewed are negative except as noted above in HPI , .    Social History:  Reviewed by me    Social History   Tobacco Use  . Smoking status: Current Every Day Smoker    Packs/day: 1.00    Types: Cigarettes  . Smokeless tobacco: Never Used  Substance Use Topics  . Alcohol use: Yes    Comment: last large amount of alcohol last night 2-18/20       Family History :  Reviewed by me    Family History  Problem Relation Age of Onset  . Cancer Other      Home Medications:   Prior to Admission medications   Medication Sig Start Date End Date Taking? Authorizing Provider  diphenhydramine-acetaminophen (TYLENOL PM) 25-500 MG TABS tablet Take 2 tablets by mouth at bedtime as needed.   Yes [provider]  Multiple Vitamin (MULTIVITAMIN WITH MINERALS) TABS tablet Take 1 tablet by mouth daily as needed.   Yes [provider]  omeprazole (PRILOSEC) 40 MG capsule Take 1 capsule (40 mg total) by mouth daily. 03/18/19  Yes Rancour, Annie Main, MD  pantoprazole (PROTONIX) 40 MG tablet Take 1 tablet by mouth daily. 11/08/18  Yes [provider]  lipase/protease/amylase (CREON) 36000 UNITS CPEP capsule Take 2 capsules (72,000 Units total) by mouth 3 (three) times daily with meals. Patient not taking: Reported on 07/29/2018 07/27/18   Murlean Iba, MD     Allergies:    No Known Allergies   Physical Exam:   Vitals  Blood pressure (!) 131/97, pulse 60, temperature 98.5 F (36.9 C), temperature source Oral, resp. rate 20, height 6' (1.829 m), weight 71 kg, SpO2 100 %.  Physical Examination: General appearance - alert, well appearing, and in no distress  Mental status - alert, oriented to person, place, and time,  Eyes - sclera anicteric Neck - supple, no JVD elevation , Chest - clear  to auscultation bilaterally, symmetrical air movement,  Heart - S1 and S2 normal, regular  Abdomen - soft,, nondistended, no masses or organomegaly, epigastric  discomfort on palpation Neurological - screening mental status exam normal, neck supple without rigidity, cranial nerves II through XII intact, DTR's normal and symmetric Extremities - no pedal edema noted, intact peripheral pulses  Skin - warm, dry     Data Review:    CBC Recent Labs  Lab 03/30/19 0737  WBC 10.0  HGB 8.0*  HCT 26.3*  PLT 1,152*  MCV 72.9*  MCH 22.2*  MCHC 30.4  RDW 17.4*  LYMPHSABS 1.3  MONOABS 0.9  EOSABS 0.1  BASOSABS 0.0   ------------------------------------------------------------------------------------------------------------------  Chemistries  Recent Labs  Lab 03/30/19 0737  NA 139  K 2.7*  CL 88*  CO2 33*  GLUCOSE 195*  BUN 16  CREATININE 1.07  CALCIUM 10.1  MG 2.0  AST  20  ALT 12  ALKPHOS 84  BILITOT 0.5   ------------------------------------------------------------------------------------------------------------------ estimated creatinine clearance is 99.5 mL/min (by C-G formula based on SCr of 1.07 mg/dL). ------------------------------------------------------------------------------------------------------------------ No results for input(s): TSH, T4TOTAL, T3FREE, THYROIDAB in the last 72 hours.  Invalid input(s): FREET3   Coagulation profile No results for input(s): INR, PROTIME in the last 168 hours. ------------------------------------------------------------------------------------------------------------------- No results for input(s): DDIMER in the last 72 hours. -------------------------------------------------------------------------------------------------------------------  Cardiac Enzymes No results for input(s): CKMB, TROPONINI, MYOGLOBIN in the last 168 hours.  Invalid input(s): CK ------------------------------------------------------------------------------------------------------------------ No results found for: BNP    ---------------------------------------------------------------------------------------------------------------  Urinalysis    Component Value Date/Time   COLORURINE YELLOW (A) 07/29/2018 1506   APPEARANCEUR CLEAR (A) 07/29/2018 1506   LABSPEC 1.016 07/29/2018 1506   PHURINE 7.0 07/29/2018 1506   GLUCOSEU NEGATIVE 07/29/2018 1506   HGBUR NEGATIVE 07/29/2018 1506   BILIRUBINUR NEGATIVE 07/29/2018 1506   KETONESUR NEGATIVE 07/29/2018 1506   PROTEINUR NEGATIVE 07/29/2018 1506   NITRITE NEGATIVE 07/29/2018 1506   LEUKOCYTESUR NEGATIVE 07/29/2018 1506    ----------------------------------------------------------------------------------------------------------------   Imaging Results:    Dg Abdomen Acute W/chest  Result Date: 03/30/2019 CLINICAL DATA:  Abdominal pain and nausea EXAM: DG ABDOMEN ACUTE W/ 1V CHEST COMPARISON:  Abdomen series March 26, 2018 FINDINGS: PA chest: No edema or consolidation. Heart size and pulmonary vascularity are normal. No adenopathy. Supine and upright abdomen: There is moderate stool in the colon. There is no bowel dilatation or air-fluid level to suggest bowel obstruction. No free air. No abnormal calcifications. IMPRESSION: Moderate stool in colon. No bowel obstruction or free air evident. Lungs clear. Electronically Signed   By: Lowella Grip III M.D.   On: 03/30/2019 08:26    Radiological Exams on Admission: Dg Abdomen Acute W/chest  Result Date: 03/30/2019 CLINICAL DATA:  Abdominal pain and nausea EXAM: DG ABDOMEN ACUTE W/ 1V CHEST COMPARISON:  Abdomen series March 26, 2018 FINDINGS: PA chest: No edema or consolidation. Heart size and pulmonary vascularity are normal. No adenopathy. Supine and upright abdomen: There is moderate stool in the colon. There is no bowel dilatation or air-fluid level to suggest bowel obstruction. No free air. No abnormal calcifications. IMPRESSION: Moderate stool in colon. No bowel obstruction or free air evident.  Lungs clear. Electronically Signed   By: Lowella Grip III M.D.   On: 03/30/2019 08:26    DVT Prophylaxis -SCD  (no heparin due to concerns about possible GI bleed) AM Labs Ordered, also please review Full Orders  Family Communication: Admission, patients condition and plan of care including tests being ordered have been discussed with the patient and wife who indicate understanding and agree with the plan   Code Status - Full Code  Likely DC to  Home  Condition   stable  Roxan Hockey M.D on 03/30/2019 at 3:20 PM Go to [email or web address] -  for contact info  [facility] - Office  [phone]

## 2019-03-30 NOTE — ED Triage Notes (Signed)
Pt c/o abd pain with nausea that has remained from two weeks ago, pt reports that he has not gotten any better,

## 2019-03-30 NOTE — ED Notes (Signed)
Date and time results received: 03/30/19 8:39 AM  (use smartphrase ".now" to insert current time)  Test: Plt Critical Value: 1,152  Name of Provider Notified: Sabra Heck  Orders Received? Or Actions Taken?: Orders Received - See Orders for details

## 2019-03-31 ENCOUNTER — Encounter (HOSPITAL_COMMUNITY): Payer: Self-pay | Admitting: Gastroenterology

## 2019-03-31 DIAGNOSIS — K922 Gastrointestinal hemorrhage, unspecified: Secondary | ICD-10-CM | POA: Diagnosis present

## 2019-03-31 LAB — CBC
HCT: 22.7 % — ABNORMAL LOW (ref 39.0–52.0)
HCT: 23.5 % — ABNORMAL LOW (ref 39.0–52.0)
HCT: 25 % — ABNORMAL LOW (ref 39.0–52.0)
Hemoglobin: 6.8 g/dL — CL (ref 13.0–17.0)
Hemoglobin: 7 g/dL — ABNORMAL LOW (ref 13.0–17.0)
Hemoglobin: 7.5 g/dL — ABNORMAL LOW (ref 13.0–17.0)
MCH: 22.5 pg — ABNORMAL LOW (ref 26.0–34.0)
MCH: 22.6 pg — ABNORMAL LOW (ref 26.0–34.0)
MCH: 22.9 pg — ABNORMAL LOW (ref 26.0–34.0)
MCHC: 29.8 g/dL — ABNORMAL LOW (ref 30.0–36.0)
MCHC: 30 g/dL (ref 30.0–36.0)
MCHC: 30 g/dL (ref 30.0–36.0)
MCV: 75.2 fL — ABNORMAL LOW (ref 80.0–100.0)
MCV: 75.8 fL — ABNORMAL LOW (ref 80.0–100.0)
MCV: 76.2 fL — ABNORMAL LOW (ref 80.0–100.0)
Platelets: 645 10*3/uL — ABNORMAL HIGH (ref 150–400)
Platelets: 761 10*3/uL — ABNORMAL HIGH (ref 150–400)
Platelets: 811 10*3/uL — ABNORMAL HIGH (ref 150–400)
RBC: 3.02 MIL/uL — ABNORMAL LOW (ref 4.22–5.81)
RBC: 3.1 MIL/uL — ABNORMAL LOW (ref 4.22–5.81)
RBC: 3.28 MIL/uL — ABNORMAL LOW (ref 4.22–5.81)
RDW: 18.3 % — ABNORMAL HIGH (ref 11.5–15.5)
RDW: 18.3 % — ABNORMAL HIGH (ref 11.5–15.5)
RDW: 18.4 % — ABNORMAL HIGH (ref 11.5–15.5)
WBC: 6.3 10*3/uL (ref 4.0–10.5)
WBC: 8 10*3/uL (ref 4.0–10.5)
WBC: 9.6 10*3/uL (ref 4.0–10.5)
nRBC: 0 % (ref 0.0–0.2)
nRBC: 0 % (ref 0.0–0.2)
nRBC: 0 % (ref 0.0–0.2)

## 2019-03-31 LAB — BASIC METABOLIC PANEL
Anion gap: 7 (ref 5–15)
BUN: 5 mg/dL — ABNORMAL LOW (ref 6–20)
CO2: 26 mmol/L (ref 22–32)
Calcium: 8.7 mg/dL — ABNORMAL LOW (ref 8.9–10.3)
Chloride: 104 mmol/L (ref 98–111)
Creatinine, Ser: 0.71 mg/dL (ref 0.61–1.24)
GFR calc Af Amer: >60 mL/min (ref >60)
GFR calc non Af Amer: >60 mL/min (ref >60)
Glucose, Bld: 107 mg/dL — ABNORMAL HIGH (ref 70–99)
Potassium: 3.7 mmol/L (ref 3.5–5.1)
Sodium: 137 mmol/L (ref 135–145)

## 2019-03-31 LAB — PATHOLOGIST SMEAR REVIEW

## 2019-03-31 LAB — PREPARE RBC (CROSSMATCH)

## 2019-03-31 LAB — HIV ANTIBODY (ROUTINE TESTING W REFLEX): HIV Screen 4th Generation wRfx: NONREACTIVE

## 2019-03-31 MED ORDER — KCL IN DEXTROSE-NACL 20-5-0.45 MEQ/L-%-% IV SOLN
INTRAVENOUS | Status: DC
  Administered 2019-03-31 – 2019-04-08 (×10): via INTRAVENOUS

## 2019-03-31 MED ORDER — HYDROMORPHONE HCL 1 MG/ML IJ SOLN
1.0000 mg | Freq: Once | INTRAMUSCULAR | Status: AC
  Administered 2019-03-31: 03:00:00 1 mg via INTRAVENOUS
  Filled 2019-03-31: qty 1

## 2019-03-31 MED ORDER — SODIUM CHLORIDE 0.9% IV SOLUTION
Freq: Once | INTRAVENOUS | Status: AC
  Administered 2019-03-31: via INTRAVENOUS

## 2019-03-31 MED ORDER — LACTULOSE 10 GM/15ML PO SOLN
30.0000 g | Freq: Once | ORAL | Status: AC
  Administered 2019-03-31: 30 g via ORAL
  Filled 2019-03-31: qty 60

## 2019-03-31 MED ORDER — POTASSIUM CHLORIDE IN NACL 40-0.9 MEQ/L-% IV SOLN
INTRAVENOUS | Status: DC
  Administered 2019-03-31: 125 mL/h via INTRAVENOUS

## 2019-03-31 MED ORDER — ALUM & MAG HYDROXIDE-SIMETH 200-200-20 MG/5ML PO SUSP
30.0000 mL | Freq: Four times a day (QID) | ORAL | Status: DC | PRN
  Administered 2019-03-31 – 2019-04-07 (×3): 30 mL via ORAL
  Filled 2019-03-31 (×3): qty 30

## 2019-03-31 NOTE — Progress Notes (Signed)
Patient Demographics:    David Miranda, is a 33 y.o. male, DOB - 1986-04-30, ONG:[id number]  Admit date - 03/30/2019   Admitting Physician Reuel Lamadrid Mariea Clonts, MD  Outpatient Primary MD for the patient is Patient, No Pcp Per  LOS - 0   Chief Complaint  Patient presents with  . Abdominal Pain        Subjective:    David Miranda today has no fevers, no emesis,  No chest pain,     Assessment  & Plan :    Principal Problem:   Emesis, persistent Active Problems:   Acute alcoholic pancreatitis   Alcohol abuse   Tobacco abuse   Cannabis abuse   Esophagitis   Pancreatic pseudocyst/cyst--alcoholic pancreatitis   GI bleed  CT abdomen and pelvis from 03/18/2019 1)Marked distal esophageal thickening and mucosal hyperemia with some adjacent reactive free fluid in the lower mediastinum, consistent with esophagitis. 2. Markedly distended stomach with ingested material. 3. Multiple hypoattenuating cystic foci throughout the pancreaslikely reflect pancreatic pseudocyst/sequela of prior pancreatitis. The largest cystic pancreatic lesion measuring 1.8 cm  Brief summary -33 year old alcoholic male with history of esophagitis and recurrent pancreatitis with pseudocyst as well as chronic anemia admitted on 03/30/2019 with intractable emesis with [employer] looking contents, hemoglobin dropped to 6.2 required transfusion of 1 unit of packed cells on 03/30/2019  A/p 1)Severe Alcoholic Esophagitis and Gastritis--continue IV Protonix as ordered, discussed with Dr. 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--continue, IV fluids, IV Protonix, as needed Zofran and Dilaudid -Elevated lipase noted on admission, abdominal pain and emesis noted --Once oral intake resumes may resume Creon with meals -GI consult requested  -Patient is advised to have abdominal imaging surveillance of his pancreatic pseudocyst every 6 months for the next 2 years to confirm stability  3)History of alcohol Abuse--- patient states he has not had a drink in close to 10 days--continue lorazepam per CIWA protocol, folic acid and thiamine as ordered -No evidence of DTs at this time  4)Thrombocytosis--suspect this is reactive, hydrate, -Platelets are trending down  5) acute on chronic anemia--suspect due to acute blood loss in the setting of intractable emesis -Hemoglobin is up to 7.0 from 6.2 after transfusion of 1 unit of PRBC on 03/30/2019.  On admission hemoglobin was 8.0 .  It was 9.2 about 12 days PTA and   previous baseline of 11 -Continue to monitor H&H and transfuse as clinically indicated -Stool occult blood is negative -Plan is for EGD on 04/01/2019   Disposition/Need for in-Hospital Stay- patient unable to be discharged at this time due to --concerns about GI bleed requiring transfusion and endoluminal evaluation compounded by esophagitis and pancreatitis-acquiring IV antiemetics and IV Dilaudid  Code Status : Full code  Family Communication:    (patient is alert, awake and coherent) -Discussed with wife  Disposition Plan  : Home once H&H stable after endoluminal evaluation  Consults  :  Gi  DVT Prophylaxis  :  SCDs (GI bleed)  Lab Results  Component Value Date   PLT 761 (H) 03/31/2019    Inpatient Medications  Scheduled Meds: . sodium chloride   Intravenous Once  . folic acid  1  mg Oral Daily  . lactulose  30 g Oral Once  . lipase/protease/amylase  72,000 Units Oral TID WC  . LORazepam  0-4 mg Intravenous Q6H   Followed by  . [START ON 04/01/2019] LORazepam  0-4 mg Intravenous Q12H  . multivitamin with minerals  1 tablet Oral Daily  . [START ON 04/03/2019] pantoprazole  40 mg Intravenous Q12H  . polyethylene glycol  17 g Oral Daily  . senna-docusate  2 tablet Oral BID  . sodium chloride flush  3 mL  Intravenous Q12H  . sucralfate  1 g Oral TID WC & HS  . thiamine  100 mg Intravenous Daily  . thiamine  100 mg Oral Daily  . traZODone  100 mg Oral QHS   Continuous Infusions: . sodium chloride    . 0.9 % NaCl with KCl 40 mEq / L 75 mL/hr (03/31/19 0836)  . pantoprozole (PROTONIX) infusion 8 mg/hr (03/31/19 0017)   PRN Meds:.sodium chloride, acetaminophen **OR** acetaminophen, albuterol, alum & mag hydroxide-simeth, HYDROmorphone (DILAUDID) injection, LORazepam **OR** LORazepam, ondansetron **OR** ondansetron (ZOFRAN) IV, sodium chloride flush    Anti-infectives (From admission, onward)   None        Objective:   Vitals:   03/30/19 2130 03/30/19 2146 03/31/19 0602 03/31/19 0818  BP: 117/76 123/73 127/70 104/71  Pulse: 70 70 62 72  Resp: 20 20 20 19   Temp: 97.7 F (36.5 C) 97.9 F (36.6 C) 98.7 F (37.1 C) 98.1 F (36.7 C)  TempSrc: Oral Oral Oral Oral  SpO2: 100% 100% 100% 99%  Weight:      Height:        Wt Readings from Last 3 Encounters:  03/30/19 71 kg  03/18/19 77.1 kg  07/29/18 80.1 kg     Intake/Output Summary (Last 24 hours) at 03/31/2019 1132 Last data filed at 03/31/2019 0618 Gross per 24 hour  Intake 1368.45 ml  Output 403 ml  Net 965.45 ml     Physical Exam  Gen:- Awake Alert,   HEENT:- [place].AT, No sclera icterus Neck-Supple Neck,No JVD,.  Lungs-  CTAB , fair symmetrical air movement CV- S1, S2 normal, regular  Abd-  +ve B.Sounds, Abd Soft, significant epigastric and left upper quadrant and periumbilical area tenderness, no rebound or guarding Extremity/Skin:- No  edema, pedal pulses present  Psych-affect is appropriate, oriented x3 Neuro-no new focal deficits, no tremors   Data Review:   Micro Results Recent Results (from the past 240 hour(s))  SARS CORONAVIRUS 2 (TAT 6-24 HRS) Nasopharyngeal Nasopharyngeal Swab     Status: None   Collection Time: 03/30/19  9:24 AM   Specimen: Nasopharyngeal Swab  Result Value Ref Range Status    SARS Coronavirus 2 NEGATIVE NEGATIVE Final    Comment: (NOTE) SARS-CoV-2 target nucleic acids are NOT DETECTED. The SARS-CoV-2 RNA is generally detectable in upper and lower respiratory specimens during the acute phase of infection. Negative results do not preclude SARS-CoV-2 infection, do not rule out co-infections with other pathogens, and should not be used as the sole basis for treatment or other patient management decisions. Negative results must be combined with clinical observations, patient history, and epidemiological information. The expected result is Negative. Fact Sheet for Patients: HairSlick.no Fact Sheet for Healthcare Providers: quierodirigir.com This test is not yet approved or cleared by the [place] FDA and  has been authorized for detection and/or diagnosis of SARS-CoV-2 by FDA under an Emergency Use Authorization (EUA). This EUA will remain  in effect (meaning this test can be  used) for the duration of the COVID-19 declaration under Section 56 4(b)(1) of the Act, 21 U.S.C. section 360bbb-3(b)(1), unless the authorization is terminated or revoked sooner. Performed at [facility], [address] [place], [place] [address]     Radiology Reports Ct Abdomen Pelvis W Contrast  Result Date: 03/18/2019 CLINICAL DATA:  Emesis and vomiting for 4 days EXAM: CT ABDOMEN AND PELVIS WITH CONTRAST TECHNIQUE: Multidetector CT imaging of the abdomen and pelvis was performed using the standard protocol following bolus administration of intravenous contrast. CONTRAST:  OMNIPAQUE IOHEXOL 300 MG/ML  SOLN COMPARISON:  CT 07/26/2018 FINDINGS: Lower chest: Dependent atelectasis. Lung bases are otherwise clear. Normal heart size. No pericardial effusion. Hepatobiliary: No focal liver abnormality is seen. No gallstones, gallbladder wall thickening, or biliary dilatation. Pancreas: Multiple hypoattenuating cystic foci  throughout the pancreas likely reflect pancreatic pseudocyst/sequela of prior pancreatitis. Largest fluid attenuation cystic lesion measures up to 1.8 cm at the pancreatic head neck junction. Note definitive connection to the main pancreatic duct. No pancreatic ductal dilatation or peripancreatic inflammation is present at this time. Spleen: Normal in size without focal abnormality. Adrenals/Urinary Tract: Normal adrenal glands. Few fluid attenuation cysts in the kidneys are unchanged from prior. No concerning renal lesions. No hydronephrosis or urolithiasis. Normal urinary bladder. Stomach/Bowel: Marked distal esophageal thickening and mucosal hyperemia with some adjacent reactive free fluid in the lower mediastinum. The stomach is markedly distended with ingested material. Duodenal sweep takes a normal anatomic course. No small bowel dilatation or wall thickening. A normal appendix is visualized. No colonic dilatation or wall thickening. Vascular/Lymphatic: The aorta is normal caliber. Few reactive upper abdominal lymph nodes. No pathologically enlarged abdominopelvic nodes. Reproductive: The prostate and seminal vesicles are unremarkable. Other: No abdominopelvic free fluid or free gas. No bowel containing hernias. Musculoskeletal: No acute osseous abnormality or suspicious osseous lesion. IMPRESSION: 1. Marked distal esophageal thickening and mucosal hyperemia with some adjacent reactive free fluid in the lower mediastinum, consistent with esophagitis. 2. Markedly distended stomach with ingested material. 3. 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 No pancreatic ductal dilatation or peripancreatic inflammation at this time. The largest cystic pancreatic lesion measuring 1.8 cm assess detailed six-month follow-up imaging for at least 2 years of stability. This recommendation follows ACR consensus guidelines: Management of  Incidental Pancreatic Cysts: A White Paper of the ACR Incidental Findings Committee. J Am Coll Radiol 2017;14:911-923. Electronically Signed   By: Kreg Shropshire M.D.   On: 03/18/2019 06:13   Dg Abdomen Acute W/chest  Result Date: 03/30/2019 CLINICAL DATA:  Abdominal pain and nausea EXAM: DG ABDOMEN ACUTE W/ 1V CHEST COMPARISON:  Abdomen series March 26, 2018 FINDINGS: PA chest: No edema or consolidation. Heart size and pulmonary vascularity are normal. No adenopathy. Supine and upright abdomen: There is moderate stool in the colon. There is no bowel dilatation or air-fluid level to suggest bowel obstruction. No free air. No abnormal calcifications. IMPRESSION: Moderate stool in colon. No bowel obstruction or free air evident. Lungs clear. Electronically Signed   By: Bretta Bang III M.D.   On: 03/30/2019 08:26     CBC Recent Labs  Lab 03/30/19 0737 03/30/19 1729 03/31/19 0003 03/31/19 0613  WBC 10.0 9.2 9.6 8.0  HGB 8.0* 6.2* 7.5* 7.0*  HCT 26.3* 21.1* 25.0* 23.5*  PLT 1,152* 883* 811* 761*  MCV 72.9* 74.3* 76.2* 75.8*  MCH 22.2* 21.8* 22.9* 22.6*  MCHC 30.4 29.4* 30.0 29.8*  RDW 17.4*  18.0* 18.4* 18.3*  LYMPHSABS 1.3  --   --   --   MONOABS 0.9  --   --   --   EOSABS 0.1  --   --   --   BASOSABS 0.0  --   --   --     Chemistries  Recent Labs  Lab 03/30/19 0737 03/31/19 0613  NA 139 137  K 2.7* 3.7  CL 88* 104  CO2 33* 26  GLUCOSE 195* 107*  BUN 16 5*  CREATININE 1.07 0.71  CALCIUM 10.1 8.7*  MG 2.0  --   AST 20  --   ALT 12  --   ALKPHOS 84  --   BILITOT 0.5  --    ------------------------------------------------------------------------------------------------------------------ No results for input(s): CHOL, HDL, LDLCALC, TRIG, CHOLHDL, LDLDIRECT in the last 72 hours.  Lab Results  Component Value Date   HGBA1C 5.2 12/27/2017   ------------------------------------------------------------------------------------------------------------------ No results  for input(s): TSH, T4TOTAL, T3FREE, THYROIDAB in the last 72 hours.  Invalid input(s): FREET3 ------------------------------------------------------------------------------------------------------------------ No results for input(s): VITAMINB12, FOLATE, FERRITIN, TIBC, IRON, RETICCTPCT in the last 72 hours.  Coagulation profile No results for input(s): INR, PROTIME in the last 168 hours.  No results for input(s): DDIMER in the last 72 hours.  Cardiac Enzymes No results for input(s): CKMB, TROPONINI, MYOGLOBIN in the last 168 hours.  Invalid input(s): CK ------------------------------------------------------------------------------------------------------------------ No results found for: BNP   Shon Hale M.D on 03/31/2019 at 11:32 AM  Go to [email or web address] - for contact info  [facility] - Office  [phone]

## 2019-03-31 NOTE — TOC Initial Note (Signed)
Transition of Care [facility]) - Initial/Assessment Note    Patient Details  Name: David Miranda MRN: [id number] Date of Birth: January 20, 1986  [facility]) CM/SW Contact:    Boneta Lucks, RN Phone Number: 03/31/2019, 2:44 PM  Clinical Narrative:        Patient admitted in Observation for emesis. Patient has no PCP and no Insurance. Patient agreeing to [facility]. Called to make the referral. TOC to follow.            Expected Discharge Plan: Home/Self Care Barriers to Discharge: Continued Medical Work up   Patient Goals and CMS Choice Patient states their goals for this hospitalization and ongoing recovery are:: to go home.      Expected Discharge Plan and Services Expected Discharge Plan: Home/Self Care     Prior Living Arrangements/Services   Lives with:: Parents         Activities of Daily Living Home Assistive Devices/Equipment: None ADL Screening (condition at time of admission) Patient's cognitive ability adequate to safely complete daily activities?: Yes Is the patient deaf or have difficulty hearing?: No Does the patient have difficulty seeing, even when wearing glasses/contacts?: No Does the patient have difficulty concentrating, remembering, or making decisions?: No Patient able to express need for assistance with ADLs?: Yes Does the patient have difficulty dressing or bathing?: No Independently performs ADLs?: Yes (appropriate for developmental age) Does the patient have difficulty walking or climbing stairs?: No Weakness of Legs: None Weakness of Arms/Hands: None  Permission Sought/Granted                  Emotional Assessment       Orientation: : Oriented to Self, Oriented to Place, Oriented to  Time, Oriented to Situation Alcohol / Substance Use: Alcohol Use, Tobacco Use Psych Involvement: No (comment)  Admission diagnosis:  Esophagitis [K20.90] Patient Active Problem List   Diagnosis Date Noted  . GI bleed 03/31/2019  . Emesis,  persistent 03/30/2019  . Esophagitis 03/30/2019  . Pancreatic pseudocyst/cyst--alcoholic pancreatitis [id number]  . Acute necrotizing pancreatitis 07/24/2018  . Cannabis abuse 03/27/2018  . Acute kidney injury ([place])   . Hypomagnesemia   . Acute on chronic pancreatitis ([place]) 03/26/2018  . GERD (gastroesophageal reflux disease) 03/26/2018  . Acute renal failure (ARF) ([place]) 03/26/2018  . Hypokalemia 02/27/2018  . Essential hypertension 02/27/2018  . Tobacco abuse 02/27/2018  . Acute pancreatitis 02/27/2018  . Constipation   . Annular pancreas   . Pancreatitis   . Non-intractable vomiting with nausea   . Hepatic steatosis   . 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 Leukocytosis 12/27/2017  . Abdominal pain, epigastric 12/27/2017  . Acute alcoholic pancreatitis 0000000  . Alcohol abuse 12/26/2017   PCP:  Patient, No Pcp Per Pharmacy:   [facility] [place] [address] [place] [address] Phone: [phone] Fax: [phone]  Readmission Risk Interventions No flowsheet data found.

## 2019-03-31 NOTE — Consult Note (Signed)
Referring Provider: Dr. Roxan Hockey Primary Care Physician:  Patient, No Pcp Per Primary Gastroenterologist:  Previously Dr. Oneida Alar, but discharged from [facility] in July 2020 due to non-compliance.   Date of Admission: 03/30/2019 Date of Consultation: 03/31/2019  Reason for Consultation:  Esophagitis   HPI:  David Miranda is a 33 y.o. year old male with history of chronic pancreatitis due to ETOH, concern for annular pancreas noted on CT Feb 2020, multiple prior admissions and presentation to the ED, presenting again yesterday with worsening epigastric pain, N/V. At time of consultation today, he is lethargic and drowsy, falling asleep while attempting to obtain history. He is requesting ativan and pain medication despite falling asleep between questions. Difficult historian. Towards end of consultation, he was more alert.   Admitting Hgb 8, down to low of 6.2. Received 1 unit PRBCs with improvement in Hgb to 7.5 overnight. This morning 7.0. Hgb several weeks ago 9.2. Earlier this year (Feb 2020) 9-11 range. Lipase 159 on admission.   States he has chronic abdominal pain that never goes away. Presented this time due to acute on chronic N/V, reporting dark emesis that looked like "[employer]". Last ETOH intake about 7 days ago per his report. Previously drinking daily and a pint would last a few days. No melena or hematochezia. Heme negative. States he has omeprazole at home but doesn't help. Takes sporadically. Feels reflux is worsening. States acid in his chest and hurts to burp. Esophagus is burning.  No dysphagia. Poor appetite. Not taking pancreatic enzymes. Aleve every now and then due to headache but unable to quantify amount. Few weeks ago states he threw up black emesis. Denies vomiting daily. Vomits about twice a week. No prior colonoscopy or EGD.   Oct 2019: weighed 75.8 kg. Mar 18 2019 in ED weight documented as 77.1 kg, during this admission two different weights of 71 kg and 77.1 kg.    CT 03/18/2019 in ED: marked distal esophageal thickening and mucosal hyperemia with esophagitis, markedly distended stomach with ingested material, multiple cystic foci throughout pancreas likely representing pancreatic pseudocysts, with larges measuring 1.8 cm.   AAS yesterday with moderate stool in colon. No bowel obstruction or free air. Lungs clear.    Past Medical History:  Diagnosis Date  . GERD (gastroesophageal reflux disease)   . Pancreatitis     Past Surgical History:  Procedure Laterality Date  . NO PAST SURGERIES      Prior to Admission medications   Medication Sig Start Date End Date Taking? Authorizing Provider  diphenhydramine-acetaminophen (TYLENOL PM) 25-500 MG TABS tablet Take 2 tablets by mouth at bedtime as needed.   Yes [provider]  Multiple Vitamin (MULTIVITAMIN WITH MINERALS) TABS tablet Take 1 tablet by mouth daily as needed.   Yes [provider]  omeprazole (PRILOSEC) 40 MG capsule Take 1 capsule (40 mg total) by mouth daily. 03/18/19  Yes Rancour, Annie Main, MD  pantoprazole (PROTONIX) 40 MG tablet Take 1 tablet by mouth daily. 11/08/18  Yes [provider]  lipase/protease/amylase (CREON) 36000 UNITS CPEP capsule Take 2 capsules (72,000 Units total) by mouth 3 (three) times daily with meals. Patient not taking: Reported on 07/29/2018 07/27/18   Murlean Iba, MD    Current Facility-Administered Medications  Medication Dose Route Frequency Provider Last Rate Last Dose  . 0.9 %  sodium chloride infusion (Manually program via Guardrails IV Fluids)   Intravenous Once Emokpae, Courage, MD      . 0.9 %  sodium  chloride infusion  250 mL Intravenous PRN Emokpae, Courage, MD      . 0.9 % NaCl with KCl 40 mEq / L  infusion   Intravenous Continuous Emokpae, Courage, MD 75 mL/hr at 03/31/19 0836 75 mL/hr at 03/31/19 0836  . acetaminophen (TYLENOL) tablet 650 mg  650 mg Oral Q6H PRN Emokpae, Courage, MD       Or  . acetaminophen  (TYLENOL) suppository 650 mg  650 mg Rectal Q6H PRN Emokpae, Courage, MD      . albuterol (PROVENTIL) (2.5 MG/3ML) 0.083% nebulizer solution 2.5 mg  2.5 mg Nebulization Q2H PRN Emokpae, Courage, MD      . alum & mag hydroxide-simeth (MAALOX/MYLANTA) 200-200-20 MG/5ML suspension 30 mL  30 mL Oral Q6H PRN Emokpae, Courage, MD      . folic acid (FOLVITE) tablet 1 mg  1 mg Oral Daily Emokpae, Courage, MD   1 mg at 03/31/19 0925  . HYDROmorphone (DILAUDID) injection 1 mg  1 mg Intravenous Q4H PRN Roxan Hockey, MD   1 mg at 03/31/19 0607  . lipase/protease/amylase (CREON) capsule 72,000 Units  72,000 Units Oral TID WC Emokpae, Courage, MD      . LORazepam (ATIVAN) injection 0-4 mg  0-4 mg Intravenous Q6H Emokpae, Courage, MD   1 mg at 03/30/19 2206   Followed by  . [START ON 04/01/2019] LORazepam (ATIVAN) injection 0-4 mg  0-4 mg Intravenous Q12H Emokpae, Courage, MD      . LORazepam (ATIVAN) tablet 1-4 mg  1-4 mg Oral Q1H PRN Roxan Hockey, MD       Or  . LORazepam (ATIVAN) injection 1-4 mg  1-4 mg Intravenous Q1H PRN Roxan Hockey, MD   1 mg at 03/31/19 0754  . multivitamin with minerals tablet 1 tablet  1 tablet Oral Daily Roxan Hockey, MD   1 tablet at 03/31/19 0924  . ondansetron (ZOFRAN) tablet 4 mg  4 mg Oral Q6H PRN Emokpae, Courage, MD       Or  . ondansetron (ZOFRAN) injection 4 mg  4 mg Intravenous Q6H PRN Emokpae, Courage, MD      . pantoprazole (PROTONIX) 80 mg in sodium chloride 0.9 % 250 mL (0.32 mg/mL) infusion  8 mg/hr Intravenous Continuous Emokpae, Courage, MD 25 mL/hr at 03/31/19 0017 8 mg/hr at 03/31/19 0017  . [START ON 04/03/2019] pantoprazole (PROTONIX) injection 40 mg  40 mg Intravenous Q12H Emokpae, Courage, MD      . polyethylene glycol (MIRALAX / GLYCOLAX) packet 17 g  17 g Oral Daily Emokpae, Courage, MD   17 g at 03/31/19 [id number]  . senna-docusate (Senokot-S) tablet 2 tablet  2 tablet Oral BID Roxan Hockey, MD   2 tablet at 03/31/19 0924  . sodium chloride  flush (NS) 0.9 % injection 3 mL  3 mL Intravenous Q12H Roxan Hockey, MD   3 mL at 03/30/19 2208  . sodium chloride flush (NS) 0.9 % injection 3 mL  3 mL Intravenous PRN Emokpae, Courage, MD      . sucralfate (CARAFATE) tablet 1 g  1 g Oral TID WC & HS Emokpae, Courage, MD   1 g at 03/30/19 2208  . thiamine (B-1) injection 100 mg  100 mg Intravenous Daily Emokpae, Courage, MD      . thiamine (VITAMIN B-1) tablet 100 mg  100 mg Oral Daily Emokpae, Courage, MD   100 mg at 03/31/19 0926  . traZODone (DESYREL) tablet 100 mg  100 mg Oral QHS Roxan Hockey, MD  100 mg at 03/30/19 2205    Allergies as of 03/30/2019  . (No Known Allergies)    Family History  Problem Relation Age of Onset  . Cancer Other     Social History   Socioeconomic History  . Marital status: Single    Spouse name: Not on file  . Number of children: Not on file  . Years of education: Not on file  . Highest education level: Not on file  Occupational History  . Not on file  Social Needs  . Financial resource strain: Patient refused  . Food insecurity    Worry: Patient refused    Inability: Patient refused  . Transportation needs    Medical: Patient refused    Non-medical: Patient refused  Tobacco Use  . Smoking status: Current Every Day Smoker    Packs/day: 1.00    Types: Cigarettes  . Smokeless tobacco: Never Used  Substance and Sexual Activity  . Alcohol use: Yes    Comment: pint every few days  . Drug use: Not Currently    Types: Marijuana    Comment: occasionally  . Sexual activity: Not on file  Lifestyle  . Physical activity    Days per week: Patient refused    Minutes per session: Patient refused  . Stress: Patient refused  Relationships  . Social Herbalist on phone: Patient refused    Gets together: Patient refused    Attends religious service: Patient refused    Active member of club or organization: Patient refused    Attends meetings of clubs or organizations: Patient  refused    Relationship status: Patient refused  . Intimate partner violence    Fear of current or ex partner: Patient refused    Emotionally abused: Patient refused    Physically abused: Patient refused    Forced sexual activity: Patient refused  Other Topics Concern  . Not on file  Social History Narrative  . Not on file    Review of Systems: Gen: see HPI CV: Denies chest pain, heart palpitations, syncope, edema  Resp: Denies shortness of breath with rest, cough, wheezing GI: see HPI GU : Denies urinary burning, urinary frequency, urinary incontinence.  MS: calf pain but negative Homan's sign, chronic for 2 weeks  Derm: Denies rash, itching, dry skin Psych: Denies depression, anxiety,confusion, or memory loss Heme: see HPI  Physical Exam: Vital signs in last 24 hours: Temp:  [97.7 F (36.5 C)-98.7 F (37.1 C)] 98.1 F (36.7 C) (10/26 0818) Pulse Rate:  [60-72] 72 (10/26 0818) Resp:  [18-20] 19 (10/26 0818) BP: (104-131)/(62-97) 104/71 (10/26 0818) SpO2:  [99 %-100 %] 99 % (10/26 0818) Weight:  [71 kg] 71 kg (10/25 1112) Last BM Date: 03/30/19 General:   Alert,  Chronically ill-appearing, drowsy and lethargic, just receiving pain medication earlier  Head:  Normocephalic and atraumatic. Eyes:  Sclera clear, no icterus.  Lungs:  Scattered rhonchi in bases Heart:  S1 S2 present without murmurs Abdomen:  Soft, mild TTP epigastric, no rebound or guarding, +BS Rectal:  Deferred  Extremities:  Without edema. Negative Homan's sign (reported calf pain for several weeks, triggered with movement).  Neurologic:  Alert and  oriented x4 Psych:  Flat affect  Intake/Output from previous day: 10/25 0701 - 10/26 0700 In: 3513.8 [P.O.:360; I.V.:591.1; Blood:315; IV Piggyback:2247.7] Out: 403 [Urine:403] Intake/Output this shift: No intake/output data recorded.  Lab Results: Recent Labs    03/30/19 1729 03/31/19 0003 03/31/19 0613  WBC 9.2 9.6 8.0  HGB 6.2* 7.5* 7.0*  HCT  21.1* 25.0* 23.5*  PLT 883* 811* 761*   BMET Recent Labs    03/30/19 0737 03/31/19 0613  NA 139 137  K 2.7* 3.7  CL 88* 104  CO2 33* 26  GLUCOSE 195* 107*  BUN 16 5*  CREATININE 1.07 0.71  CALCIUM 10.1 8.7*   LFT Recent Labs    03/30/19 0737  PROT 8.0  ALBUMIN 4.0  AST 20  ALT 12  ALKPHOS 84  BILITOT 0.5   Lab Results  Component Value Date   LIPASE 159 (H) 03/30/2019    Studies/Results: Dg Abdomen Acute W/chest  Result Date: 03/30/2019 CLINICAL DATA:  Abdominal pain and nausea EXAM: DG ABDOMEN ACUTE W/ 1V CHEST COMPARISON:  Abdomen series March 26, 2018 FINDINGS: PA chest: No edema or consolidation. Heart size and pulmonary vascularity are normal. No adenopathy. Supine and upright abdomen: There is moderate stool in the colon. There is no bowel dilatation or air-fluid level to suggest bowel obstruction. No free air. No abnormal calcifications. IMPRESSION: Moderate stool in colon. No bowel obstruction or free air evident. Lungs clear. Electronically Signed   By: Lowella Grip III M.D.   On: 03/30/2019 08:26    Impression: 33 year old male with history of chronic pancreatitis due to ETOH, esophagitis on prior CT imaging, chronic abdominal pain, N/V, presenting with profound anemia and subjective reports of possible hematemesis. Received 1 unit PRBCs with improvement in Hgb to 7.5 from 6.2, then drifting to 7 this morning.  Heme negative and no reports of melena or hematochezia. Lipase 159 but no CT on admission: likely dealing with component of mild acute on chronic pancreatitis.  With notable drop in Hgb from several weeks ago (9.2), recommend diagnostic EGD during admission. Although he reports possible hematemesis, none has been observed while admitted. Drop again in Hgb this morning to 7 likely multifactorial in setting of hemodilution. He remains without overt GI bleeding.   From a pancreatitis standpoint: he notes improvement in abdominal pain and requesting  regular food. Some concern for annular pancreas on imaging in Feb 2020. Continue with supportive measures and recommend resuming Creon when diet resumed. He will need outpatient dedicated imaging of pancreas as previously recommended in the past. Prior MRI had been arranged but not completed due to lack of finances per patient. ETOH cessation imperative.   Plan: NPO for now but will advance to clear liquids if no EGD today Continue Protonix infusion that was started on admission Continue supportive measures with IVFs, pain control Timing of EGD to be determined after discussing with Dr. Laural Golden Transfuse as needed Serial monitoring H/H Once diet advanced, start pancreatic enzymes Will need dedicated imaging of pancreas as outpatient Patient was dismissed from [facility] due to multiple no-shows: will need to establish care with GI as outpatient ETOH cessation    Annitta Needs, PhD, ANP-BC [facility]     LOS: 0 days    03/31/2019, 9:39 AM  \

## 2019-03-31 NOTE — Progress Notes (Addendum)
CRITICAL VALUE ALERT  Critical Value: Hemoglobin 6.8  Date & Time Notied:  03/30/2020  1548  Provider Notified: Dr. Joesph Fillers  Orders Received/Actions taken:

## 2019-04-01 ENCOUNTER — Encounter (HOSPITAL_COMMUNITY)
Admission: EM | Disposition: A | Discharge status: Home / Self Care | Payer: Self-pay | Source: Home / Self Care | Attending: Family Medicine

## 2019-04-01 ENCOUNTER — Encounter (HOSPITAL_COMMUNITY): Payer: Self-pay | Admitting: Anesthesiology

## 2019-04-01 ENCOUNTER — Inpatient Hospital Stay (HOSPITAL_COMMUNITY): Payer: Self-pay | Admitting: Anesthesiology

## 2019-04-01 DIAGNOSIS — K2100 Gastro-esophageal reflux disease with esophagitis, without bleeding: Secondary | ICD-10-CM

## 2019-04-01 DIAGNOSIS — Z538 Procedure and treatment not carried out for other reasons: Secondary | ICD-10-CM

## 2019-04-01 HISTORY — PX: ESOPHAGOGASTRODUODENOSCOPY (EGD) WITH PROPOFOL: SHX5813

## 2019-04-01 LAB — TYPE AND SCREEN
ABO/RH(D): B NEG
Antibody Screen: NEGATIVE
Unit division: 0
Unit division: 0

## 2019-04-01 LAB — BPAM RBC
Blood Product Expiration Date: 202011192359
Blood Product Expiration Date: 202012032359
ISSUE DATE / TIME: 202010251840
ISSUE DATE / TIME: 202010262353
Unit Type and Rh: 1700
Unit Type and Rh: 1700

## 2019-04-01 LAB — CBC
HCT: 29.2 % — ABNORMAL LOW (ref 39.0–52.0)
Hemoglobin: 9 g/dL — ABNORMAL LOW (ref 13.0–17.0)
MCH: 23.9 pg — ABNORMAL LOW (ref 26.0–34.0)
MCHC: 30.8 g/dL (ref 30.0–36.0)
MCV: 77.7 fL — ABNORMAL LOW (ref 80.0–100.0)
Platelets: 776 10*3/uL — ABNORMAL HIGH (ref 150–400)
RBC: 3.76 MIL/uL — ABNORMAL LOW (ref 4.22–5.81)
RDW: 19.9 % — ABNORMAL HIGH (ref 11.5–15.5)
WBC: 11.2 10*3/uL — ABNORMAL HIGH (ref 4.0–10.5)
nRBC: 0 % (ref 0.0–0.2)

## 2019-04-01 LAB — LIPASE, BLOOD: Lipase: 80 U/L — ABNORMAL HIGH (ref 11–51)

## 2019-04-01 SURGERY — ESOPHAGOGASTRODUODENOSCOPY (EGD) WITH PROPOFOL
Anesthesia: General

## 2019-04-01 MED ORDER — KETAMINE HCL 10 MG/ML IJ SOLN
INTRAMUSCULAR | Status: DC | PRN
  Administered 2019-04-01 (×2): 10 mg via INTRAVENOUS

## 2019-04-01 MED ORDER — LACTATED RINGERS IV SOLN
INTRAVENOUS | Status: DC | PRN
  Administered 2019-04-01: 15:00:00 via INTRAVENOUS

## 2019-04-01 MED ORDER — LIDOCAINE VISCOUS HCL 2 % MT SOLN
5.0000 mL | Freq: Once | OROMUCOSAL | Status: DC
  Filled 2019-04-01: qty 15

## 2019-04-01 MED ORDER — ONDANSETRON HCL 4 MG/2ML IJ SOLN
4.0000 mg | Freq: Three times a day (TID) | INTRAMUSCULAR | Status: DC
  Administered 2019-04-01 – 2019-04-08 (×24): 4 mg via INTRAVENOUS
  Filled 2019-04-01 (×25): qty 2

## 2019-04-01 MED ORDER — LIDOCAINE VISCOUS HCL 2 % MT SOLN
OROMUCOSAL | Status: AC
  Filled 2019-04-01: qty 15

## 2019-04-01 MED ORDER — PROPOFOL 10 MG/ML IV BOLUS
INTRAVENOUS | Status: DC | PRN
  Administered 2019-04-01 (×2): 20 mg via INTRAVENOUS
  Administered 2019-04-01: 40 mg via INTRAVENOUS
  Administered 2019-04-01: 30 mg via INTRAVENOUS
  Administered 2019-04-01 (×3): 20 mg via INTRAVENOUS
  Administered 2019-04-01: 30 mg via INTRAVENOUS

## 2019-04-01 MED ORDER — PROPOFOL 500 MG/50ML IV EMUL
INTRAVENOUS | Status: DC | PRN
  Administered 2019-04-01: 150 ug/kg/min via INTRAVENOUS

## 2019-04-01 MED ORDER — METOCLOPRAMIDE HCL 5 MG/ML IJ SOLN
INTRAMUSCULAR | Status: AC
  Filled 2019-04-01: qty 2

## 2019-04-01 MED ORDER — PANTOPRAZOLE SODIUM 40 MG PO TBEC
40.0000 mg | DELAYED_RELEASE_TABLET | Freq: Two times a day (BID) | ORAL | Status: DC
  Administered 2019-04-02: 40 mg via ORAL
  Filled 2019-04-01: qty 1

## 2019-04-01 MED ORDER — METOCLOPRAMIDE HCL 5 MG/ML IJ SOLN
INTRAMUSCULAR | Status: DC | PRN
  Administered 2019-04-01: 10 mg via INTRAVENOUS

## 2019-04-01 NOTE — Anesthesia Postprocedure Evaluation (Signed)
Anesthesia Post Note  Patient: David Miranda  Procedure(s) Performed: ESOPHAGOGASTRODUODENOSCOPY (EGD) WITH PROPOFOL (N/A )  Patient location during evaluation: PACU Anesthesia Type: General Level of consciousness: awake and alert and oriented Pain management: pain level controlled Vital Signs Assessment: post-procedure vital signs reviewed and stable Respiratory status: spontaneous breathing, respiratory function stable and patient connected to nasal cannula oxygen Cardiovascular status: blood pressure returned to baseline and stable Postop Assessment: no apparent nausea or vomiting Anesthetic complications: no     Last Vitals:  Vitals:   04/01/19 1600 04/01/19 1615  BP: (!) 86/63 103/74  Pulse: 87 85  Resp: 19 13  Temp:    SpO2: 100% 100%    Last Pain:  Vitals:   04/01/19 1615  TempSrc:   PainSc: 0-No pain                 Akshita Italiano C Claudie Rathbone

## 2019-04-01 NOTE — Anesthesia Preprocedure Evaluation (Signed)
Anesthesia Evaluation  Patient identified by MRN, date of birth, ID band Patient awake    Reviewed: Allergy & Precautions, NPO status , Patient's Chart, lab work & pertinent test results  Airway Mallampati: III  TM Distance: >3 FB Neck ROM: Full    Dental  (+) Missing, Dental Advisory Given   Pulmonary Current Smoker and Patient abstained from smoking.,    Pulmonary exam normal breath sounds clear to auscultation       Cardiovascular Exercise Tolerance: Good hypertension, Pt. on medications Normal cardiovascular exam Rhythm:Regular Rate:Normal     Neuro/Psych PSYCHIATRIC DISORDERS    GI/Hepatic GERD  Medicated,(+)     substance abuse  alcohol use, cocaine use and marijuana use, Acute alcoholic pancreatitis, GI bleed   Endo/Other  negative endocrine ROS  Renal/GU ARFRenal disease     Musculoskeletal   Abdominal   Peds  Hematology  (+) anemia ,   Anesthesia Other Findings   Reproductive/Obstetrics                             Anesthesia Physical Anesthesia Plan  ASA: III  Anesthesia Plan: General   Post-op Pain Management:    Induction: Intravenous  PONV Risk Score and Plan:   Airway Management Planned: Nasal Cannula and Natural Airway  Additional Equipment:   Intra-op Plan:   Post-operative Plan:   Informed Consent: I have reviewed the patients History and Physical, chart, labs and discussed the procedure including the risks, benefits and alternatives for the proposed anesthesia with the patient or authorized representative who has indicated his/her understanding and acceptance.     Dental advisory given  Plan Discussed with: CRNA  Anesthesia Plan Comments:         Anesthesia Quick Evaluation

## 2019-04-01 NOTE — Progress Notes (Signed)
Patient Demographics:    David Miranda, is a 33 y.o. male, DOB - 09/25/1985, EXB:[id number]  Admit date - 03/30/2019   Admitting Physician Marissah Vandemark Mariea Clonts, MD  Outpatient Primary MD for the patient is Patient, No Pcp Per  LOS - 1   Chief Complaint  Patient presents with  . Abdominal Pain        Subjective:    David Miranda today has no fevers, no emesis,  No chest pain,     Assessment  & Plan :    Principal Problem:   Emesis, persistent Active Problems:   Acute alcoholic pancreatitis   Alcohol abuse   Tobacco abuse   Cannabis abuse   Esophagitis   Pancreatic pseudocyst/cyst--alcoholic pancreatitis   GI bleed  CT abdomen and pelvis from 03/18/2019 1)Marked distal esophageal thickening and mucosal hyperemia with some adjacent reactive free fluid in the lower mediastinum, consistent with esophagitis. 2. Markedly distended stomach with ingested material. 3. Multiple hypoattenuating cystic foci throughout the pancreaslikely reflect pancreatic pseudocyst/sequela of prior pancreatitis. The largest cystic pancreatic lesion measuring 1.8 cm  Brief summary -33 year old alcoholic male with history of esophagitis and recurrent pancreatitis with pseudocyst as well as chronic anemia admitted on 03/30/2019 with intractable emesis with [employer] looking contents, hemoglobin dropped to 6.2 required transfusion of 1 unit of packed cells on 03/30/2019  A/p 1)Severe Alcoholic Esophagitis and Gastritis--continue IV Protonix as ordered, discussed with  v GI service, plan is for EGD on 04/01/2019\-EGD on 04/01/2019 with erosive esophagitis with bleeding, gastric and duodenal evaluation aborted due to significant amount of retained food in the stomach despite patient being n.p.o -Continue Protonix twice daily, -Plan is for UGI in a.m. to rule out GOO -As needed Zofran  2)Acute on chronic alcoholic Pancreatitis  with history of pseudocyst--continue, IV fluids, , as needed Zofran and Dilaudid -Continue Protonix -Elevated lipase but trending down, abdominal pain improving, nausea persist but no further emesis --Once oral intake resumes may resume Creon with meals -Patient is advised to have abdominal imaging surveillance of his pancreatic pseudocyst every 6 months for the next 2 years to confirm stability  3)History of alcohol Abuse--- patient states he has not had a drink for over a week PTA-continue lorazepam per CIWA protocol, folic acid and thiamine as ordered -No evidence of DTs at this time  4)Thrombocytosis--suspect this is reactive, hydrate, -Platelets are trending down   5) acute on chronic anemia--suspect due to acute blood loss in the setting of intractable emesis -Hemoglobin is up to 9.0 from 6.2 after transfusion of 2 unit of PRBC on 03/30/2019.  On admission hemoglobin was 8.0 .  It was 9.2 about 12 days PTA and   previous baseline of 11 -Continue to monitor H&H and transfuse as clinically indicated -Stool occult blood is negative -EGD as noted above with erosive esophagitis and bleeding   Disposition/Need for in-Hospital Stay- patient unable to be discharged at this time due to --concerns about GI bleed requiring transfusion and endoluminal evaluation compounded by esophagitis and pancreatitis-acquiring IV antiemetics and IV Dilaudid -UGI in a.m. to r/o GOO  Code Status : Full code  Family Communication:    (patient is alert, awake and coherent) -Discussed with wife  Disposition Plan  : Home once H&H stable after  GI evaluation is complete  Procedure- EGD on 04/01/2019 with erosive esophagitis with bleeding, gastric and duodenal evaluation aborted due to significant amount of retained food in the stomach despite patient being n.p.o.  Consults  :  Gi  DVT Prophylaxis  :  SCDs (GI bleed)  Lab Results  Component Value Date   PLT 776 (H) 04/01/2019    Inpatient Medications   Scheduled Meds: . sodium chloride   Intravenous Once  . folic acid  1 mg Oral Daily  . lidocaine  5 mL Mouth/Throat Once  . lipase/protease/amylase  72,000 Units Oral TID WC  . LORazepam  0-4 mg Intravenous Q12H  . multivitamin with minerals  1 tablet Oral Daily  . ondansetron (ZOFRAN) IV  4 mg Intravenous TID WC & HS  . pantoprazole  40 mg Oral BID AC  . [START ON 04/03/2019] pantoprazole  40 mg Intravenous Q12H  . polyethylene glycol  17 g Oral Daily  . senna-docusate  2 tablet Oral BID  . sodium chloride flush  3 mL Intravenous Q12H  . sucralfate  1 g Oral TID WC & HS  . thiamine  100 mg Intravenous Daily  . thiamine  100 mg Oral Daily  . traZODone  100 mg Oral QHS   Continuous Infusions: . sodium chloride    . dextrose 5 % and 0.45 % NaCl with KCl 20 mEq/L 50 mL/hr at 03/31/19 1145  . pantoprozole (PROTONIX) infusion 8 mg/hr (04/01/19 0327)   PRN Meds:.sodium chloride, acetaminophen **OR** acetaminophen, albuterol, alum & mag hydroxide-simeth, HYDROmorphone (DILAUDID) injection, LORazepam **OR** LORazepam, ondansetron **OR** ondansetron (ZOFRAN) IV, sodium chloride flush    Anti-infectives (From admission, onward)   None        Objective:   Vitals:   04/01/19 1552 04/01/19 1600 04/01/19 1615 04/01/19 1622  BP: (!) 84/56 (!) 86/63 103/74 106/77  Pulse: 95 87 85 95  Resp: (!) 23 19 13 16   Temp: 98.1 F (36.7 C)   98.3 F (36.8 C)  TempSrc:      SpO2: 99% 100% 100% 100%  Weight:      Height:        Wt Readings from Last 3 Encounters:  04/01/19 71 kg  03/18/19 77.1 kg  07/29/18 80.1 kg     Intake/Output Summary (Last 24 hours) at 04/01/2019 1701 Last data filed at 04/01/2019 1544 Gross per 24 hour  Intake 1955.83 ml  Output 0 ml  Net 1955.83 ml     Physical Exam  Gen:- Awake Alert,   HEENT:- [place].AT, No sclera icterus Neck-Supple Neck,No JVD,.  Lungs-  CTAB , fair symmetrical air movement CV- S1, S2 normal, regular  Abd-  +ve B.Sounds, Abd Soft,  improving epigastric and left upper quadrant and periumbilical area tenderness, no rebound or guarding Extremity/Skin:- No  edema, pedal pulses present  Psych-affect is appropriate, oriented x3 Neuro-no new focal deficits, no tremors   Data Review:   Micro Results Recent Results (from the past 240 hour(s))  SARS CORONAVIRUS 2 (TAT 6-24 HRS) Nasopharyngeal Nasopharyngeal Swab     Status: None   Collection Time: 03/30/19  9:24 AM   Specimen: Nasopharyngeal Swab  Result Value Ref Range Status   SARS Coronavirus 2 NEGATIVE NEGATIVE Final    Comment: (NOTE) SARS-CoV-2 target nucleic acids are NOT DETECTED. The SARS-CoV-2 RNA is generally detectable in upper and lower respiratory specimens during the acute phase of infection. Negative results do not preclude SARS-CoV-2 infection, do not rule out co-infections with other pathogens,  and should not be used as the sole basis for treatment or other patient management decisions. Negative results must be combined with clinical observations, patient history, and epidemiological information. The expected result is Negative. Fact Sheet for Patients: HairSlick.no Fact Sheet for Healthcare Providers: quierodirigir.com This test is not yet approved or cleared by the [place] FDA and  has been authorized for detection and/or diagnosis of SARS-CoV-2 by FDA under an Emergency Use Authorization (EUA). This EUA will remain  in effect (meaning this test can be used) for the duration of the COVID-19 declaration under Section 56 4(b)(1) of the Act, 21 U.S.C. section 360bbb-3(b)(1), unless the authorization is terminated or revoked sooner. Performed at [facility], [address] [place], [place] [address]     Radiology Reports Ct Abdomen Pelvis W Contrast  Result Date: 03/18/2019 CLINICAL DATA:  Emesis and vomiting for 4 days EXAM: CT ABDOMEN AND PELVIS WITH CONTRAST TECHNIQUE:  Multidetector CT imaging of the abdomen and pelvis was performed using the standard protocol following bolus administration of intravenous contrast. CONTRAST:  OMNIPAQUE IOHEXOL 300 MG/ML  SOLN COMPARISON:  CT 07/26/2018 FINDINGS: Lower chest: Dependent atelectasis. Lung bases are otherwise clear. Normal heart size. No pericardial effusion. Hepatobiliary: No focal liver abnormality is seen. No gallstones, gallbladder wall thickening, or biliary dilatation. 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 Largest fluid attenuation cystic lesion measures up to 1.8 cm at the pancreatic head neck junction. Note definitive connection to the main pancreatic duct. No pancreatic ductal dilatation or peripancreatic inflammation is present at this time. Spleen: Normal in size without focal abnormality. Adrenals/Urinary Tract: Normal adrenal glands. Few fluid attenuation cysts in the kidneys are unchanged from prior. No concerning renal lesions. No hydronephrosis or urolithiasis. Normal urinary bladder. Stomach/Bowel: Marked distal esophageal thickening and mucosal hyperemia with some adjacent reactive free fluid in the lower mediastinum. The stomach is markedly distended with ingested material. Duodenal sweep takes a normal anatomic course. No small bowel dilatation or wall thickening. A normal appendix is visualized. No colonic dilatation or wall thickening. Vascular/Lymphatic: The aorta is normal caliber. Few reactive upper abdominal lymph nodes. No pathologically enlarged abdominopelvic nodes. Reproductive: The prostate and seminal vesicles are unremarkable. Other: No abdominopelvic free fluid or free gas. No bowel containing hernias. Musculoskeletal: No acute osseous abnormality or suspicious osseous lesion. IMPRESSION: 1. Marked distal esophageal thickening and mucosal hyperemia with some adjacent reactive free fluid in the  lower mediastinum, consistent with esophagitis. 2. Markedly distended stomach with ingested material. 3. 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 No pancreatic ductal dilatation or peripancreatic inflammation at this time. The largest cystic pancreatic lesion measuring 1.8 cm assess detailed six-month follow-up imaging for at least 2 years of stability. This recommendation follows ACR consensus guidelines: Management of Incidental Pancreatic Cysts: A White Paper of the ACR Incidental Findings Committee. J Am Coll Radiol 2017;14:911-923. Electronically Signed   By: Kreg Shropshire M.D.   On: 03/18/2019 06:13   Dg Abdomen Acute W/chest  Result Date: 03/30/2019 CLINICAL DATA:  Abdominal pain and nausea EXAM: DG ABDOMEN ACUTE W/ 1V CHEST COMPARISON:  Abdomen series March 26, 2018 FINDINGS: PA chest: No edema or consolidation. Heart size and pulmonary vascularity are normal. No adenopathy. Supine and upright abdomen: There is moderate stool in the colon. There is no bowel dilatation or air-fluid level to suggest bowel obstruction. No free air. No abnormal calcifications. IMPRESSION: Moderate  stool in colon. No bowel obstruction or free air evident. Lungs clear. Electronically Signed   By: Bretta Bang III M.D.   On: 03/30/2019 08:26     CBC Recent Labs  Lab 03/30/19 0737 03/30/19 1729 03/31/19 0003 03/31/19 0613 03/31/19 1538 04/01/19 0621  WBC 10.0 9.2 9.6 8.0 6.3 11.2*  HGB 8.0* 6.2* 7.5* 7.0* 6.8* 9.0*  HCT 26.3* 21.1* 25.0* 23.5* 22.7* 29.2*  PLT 1,152* 883* 811* 761* 645* 776*  MCV 72.9* 74.3* 76.2* 75.8* 75.2* 77.7*  MCH 22.2* 21.8* 22.9* 22.6* 22.5* 23.9*  MCHC 30.4 29.4* 30.0 29.8* 30.0 30.8  RDW 17.4* 18.0* 18.4* 18.3* 18.3* 19.9*  LYMPHSABS 1.3  --   --   --   --   --   MONOABS 0.9  --   --   --   --   --   EOSABS 0.1  --   --   --   --   --   BASOSABS 0.0  --   --   --   --   --      Chemistries  Recent Labs  Lab 03/30/19 0737 03/31/19 0613  NA 139 137  K 2.7* 3.7  CL 88* 104  CO2 33* 26  GLUCOSE 195* 107*  BUN 16 5*  CREATININE 1.07 0.71  CALCIUM 10.1 8.7*  MG 2.0  --   AST 20  --   ALT 12  --   ALKPHOS 84  --   BILITOT 0.5  --    ------------------------------------------------------------------------------------------------------------------ No results for input(s): CHOL, HDL, LDLCALC, TRIG, CHOLHDL, LDLDIRECT in the last 72 hours.  Lab Results  Component Value Date   HGBA1C 5.2 12/27/2017   ------------------------------------------------------------------------------------------------------------------ No results for input(s): TSH, T4TOTAL, T3FREE, THYROIDAB in the last 72 hours.  Invalid input(s): FREET3 ------------------------------------------------------------------------------------------------------------------ No results for input(s): VITAMINB12, FOLATE, FERRITIN, TIBC, IRON, RETICCTPCT in the last 72 hours.  Coagulation profile No results for input(s): INR, PROTIME in the last 168 hours.  No results for input(s): DDIMER in the last 72 hours.  Cardiac Enzymes No results for input(s): CKMB, TROPONINI, MYOGLOBIN in the last 168 hours.  Invalid input(s): CK ------------------------------------------------------------------------------------------------------------------ No results found for: BNP   Shon Hale M.D on 04/01/2019 at 5:01 PM  Go to [email or web address] - for contact info  [facility] - Office  [phone]

## 2019-04-01 NOTE — Op Note (Signed)
[facility] Patient Name: David Miranda Procedure Date: 04/01/2019 2:54 PM MRN: [id number] Date of Birth: 1985/10/11 Attending MD: Barney Drain MD, MD CSN: [id number] Age: 33 Admit Type: Inpatient Procedure:                Upper GI endoscopy, INCOMPLETE Indications:              Abnormal CT of the GI tract, Nausea with vomiting Providers:                Barney Drain MD, MD, Otis Peak B. Sharon Seller, RN,                            Randa Spike, Technician Referring MD:             Marlynn Perking Medicines:                Propofol per Anesthesia Complications:            No immediate complications. Estimated Blood Loss:     Estimated blood loss: none. Procedure:                Pre-Anesthesia Assessment:                           - Prior to the procedure, a History and Physical                            was performed, and patient medications and                            allergies were reviewed. The patient's tolerance of                            previous anesthesia was also reviewed. The risks                            and benefits of the procedure and the sedation                            options and risks were discussed with the patient.                            All questions were answered, and informed consent                            was obtained. Prior Anticoagulants: The patient has                            taken no previous anticoagulant or antiplatelet                            agents. ASA Grade Assessment: II - A patient with                            mild systemic disease. After reviewing the risks  and benefits, the patient was deemed in                            satisfactory condition to undergo the procedure.                            After obtaining informed consent, the endoscope was                            passed under direct vision. Throughout the                            procedure, the patient's blood pressure, pulse, and                      oxygen saturations were monitored continuously. The                            [id number] ID:[id number]) scope was introduced through the                            mouth, with the intention of advancing to the                            stomach. The scope was advanced to the gastric                            fundus before the procedure was aborted.                            Medications were given. The patient tolerated the                            procedure well. The procedure was aborted due to                            presence of food. Performing the maneuvers                            documented (below) in this report did not allow for                            the successful completion of the procedure. Scope In: 3:26:59 PM Scope Out: 3:37:43 PM Total Procedure Duration: 0 hours 10 minutes 44 seconds  Findings:      LA Grade D (one or more mucosal breaks involving at least 75% of       esophageal circumference) esophagitis with bleeding was found in the       middle third of the esophagus.      Excessive fluid was found at the gastroesophageal junction, in the       cardia and in the gastric fundus. Fluid aspiration was performed. Impression:               - The procedure was aborted due to presence of  food/LIQUID IN THE STOMACH                           - MICROCYTIC ANEMIA LIKELY DUE TO SEVERE EROSIVE                            esophagitis.                           - Excessive gastric fluid: > 1L ASPIRATED. Moderate Sedation:      Per Anesthesia Care Recommendation:           - Return patient to hospital ward for ongoing care.                            PLAN UGI TO ASSESS FOR GASTRIC OUTLET OBSTRUCTION                            ON OCT 28.                           - Soft diet and low fat diet.                           - Continue present medications. PROTONIX BID.                            VISCOUS LIDOCAINE PRN. ZOFRAN QACHS.                            - Repeat upper endoscopy in 3 months to evaluate                            the response to therapy AS AN OUTPT. Procedure Code(s):        --- Professional ---                           [phone], 52, Esophagogastroduodenoscopy, flexible,                            transoral; diagnostic, including collection of                            specimen(s) by brushing or washing, when performed                            (separate procedure) Diagnosis Code(s):        --- Professional ---                           Z53.8, Procedure and treatment not carried out for                            other reasons                           K21.0,  Gastro-esophageal reflux disease with                            esophagitis                           R11.2, Nausea with vomiting, unspecified                           R93.3, Abnormal findings on diagnostic imaging of                            other parts of digestive tract CPT copyright 2019 American Medical Association. All rights reserved. The codes documented in this report are preliminary and upon coder review may  be revised to meet current compliance requirements. Barney Drain, MD Barney Drain MD, MD 04/01/2019 4:04:13 PM This report has been signed electronically. Number of Addenda: 0

## 2019-04-01 NOTE — Transfer of Care (Signed)
Immediate Anesthesia Transfer of Care Note  Patient: David Miranda  Procedure(s) Performed: ESOPHAGOGASTRODUODENOSCOPY (EGD) WITH PROPOFOL (N/A )  Patient Location: PACU  Anesthesia Type:General  Level of Consciousness: sedated  Airway & Oxygen Therapy: Patient Spontanous Breathing and Patient connected to nasal cannula oxygen  Post-op Assessment: Report given to RN  Post vital signs: Reviewed and stable  Last Vitals:  Vitals Value Taken Time  BP 84/56 04/01/19 1552  Temp    Pulse 95 04/01/19 1553  Resp 20 04/01/19 1553  SpO2 100 % 04/01/19 1553  Vitals shown include unvalidated device data.  Last Pain:  Vitals:   04/01/19 1317  TempSrc: Oral  PainSc: 5       Patients Stated Pain Goal: 3 ([id number])  Complications: No apparent anesthesia complications

## 2019-04-01 NOTE — H&P (Signed)
Primary Care Physician:  Patient, No Pcp Per Primary Gastroenterologist:  Dr. Oneida Alar  Pre-Procedure History & Physical: HPI:  David Miranda is a 33 y.o. male here for Coffee ground emesis/ANEMIA.  Past Medical History:  Diagnosis Date  . GERD (gastroesophageal reflux disease)   . Pancreatitis     Past Surgical History:  Procedure Laterality Date  . NO PAST SURGERIES      Prior to Admission medications   Medication Sig Start Date End Date Taking? Authorizing Provider  diphenhydramine-acetaminophen (TYLENOL PM) 25-500 MG TABS tablet Take 2 tablets by mouth at bedtime as needed.   Yes [provider]  Multiple Vitamin (MULTIVITAMIN WITH MINERALS) TABS tablet Take 1 tablet by mouth daily as needed.   Yes [provider]  omeprazole (PRILOSEC) 40 MG capsule Take 1 capsule (40 mg total) by mouth daily. 03/18/19  Yes Rancour, Annie Main, MD  pantoprazole (PROTONIX) 40 MG tablet Take 1 tablet by mouth daily. 11/08/18  Yes [provider]  lipase/protease/amylase (CREON) 36000 UNITS CPEP capsule Take 2 capsules (72,000 Units total) by mouth 3 (three) times daily with meals. Patient not taking: Reported on 07/29/2018 07/27/18   Murlean Iba, MD    Allergies as of 03/30/2019  . (No Known Allergies)    Family History  Problem Relation Age of Onset  . Cancer Other     Social History   Socioeconomic History  . Marital status: Single    Spouse name: Not on file  . Number of children: Not on file  . Years of education: Not on file  . Highest education level: Not on file  Occupational History  . Not on file  Social Needs  . Financial resource strain: Patient refused  . Food insecurity    Worry: Patient refused    Inability: Patient refused  . Transportation needs    Medical: Patient refused    Non-medical: Patient refused  Tobacco Use  . Smoking status: Current Every Day Smoker    Packs/day: 1.00    Types: Cigarettes  . Smokeless tobacco: Never  Used  Substance and Sexual Activity  . Alcohol use: Yes    Comment: pint every few days  . Drug use: Not Currently    Types: Marijuana    Comment: occasionally  . Sexual activity: Not on file  Lifestyle  . Physical activity    Days per week: Patient refused    Minutes per session: Patient refused  . Stress: Patient refused  Relationships  . Social Herbalist on phone: Patient refused    Gets together: Patient refused    Attends religious service: Patient refused    Active member of club or organization: Patient refused    Attends meetings of clubs or organizations: Patient refused    Relationship status: Patient refused  . Intimate partner violence    Fear of current or ex partner: Patient refused    Emotionally abused: Patient refused    Physically abused: Patient refused    Forced sexual activity: Patient refused  Other Topics Concern  . Not on file  Social History Narrative  . Not on file    Review of Systems: See HPI, otherwise negative ROS   Physical Exam: BP 101/65   Pulse 98   Temp 98.5 F (36.9 C) (Oral)   Resp 18   Ht 6' (1.829 m)   Wt 71 kg   SpO2 96%   BMI 21.23 kg/m  General:   Alert,  pleasant and cooperative in  NAD Head:  Normocephalic and atraumatic. Neck:  Supple; Lungs:  Clear throughout to auscultation.    Heart:  Regular rate and rhythm. Abdomen:  Soft, nontender and nondistended. Normal bowel sounds, without guarding, and without rebound.   Neurologic:  Alert and  oriented x4;  grossly normal neurologically.  Impression/Plan:     Coffee ground emesis/ANEMIA  PLAN: EGD TODAY.  DISCUSSED PROCEDURE, BENEFITS, & RISKS: < 1% chance of medication reaction, bleeding, perforation, or ASPIRATION.

## 2019-04-02 ENCOUNTER — Inpatient Hospital Stay (HOSPITAL_COMMUNITY): Payer: Self-pay

## 2019-04-02 DIAGNOSIS — R112 Nausea with vomiting, unspecified: Secondary | ICD-10-CM

## 2019-04-02 DIAGNOSIS — F1210 Cannabis abuse, uncomplicated: Secondary | ICD-10-CM

## 2019-04-02 DIAGNOSIS — K209 Esophagitis, unspecified without bleeding: Secondary | ICD-10-CM

## 2019-04-02 DIAGNOSIS — R109 Unspecified abdominal pain: Secondary | ICD-10-CM

## 2019-04-02 DIAGNOSIS — F1010 Alcohol abuse, uncomplicated: Secondary | ICD-10-CM

## 2019-04-02 DIAGNOSIS — R1010 Upper abdominal pain, unspecified: Secondary | ICD-10-CM

## 2019-04-02 DIAGNOSIS — R1115 Cyclical vomiting syndrome unrelated to migraine: Secondary | ICD-10-CM

## 2019-04-02 DIAGNOSIS — K852 Alcohol induced acute pancreatitis without necrosis or infection: Secondary | ICD-10-CM

## 2019-04-02 DIAGNOSIS — K862 Cyst of pancreas: Secondary | ICD-10-CM

## 2019-04-02 DIAGNOSIS — K863 Pseudocyst of pancreas: Secondary | ICD-10-CM

## 2019-04-02 DIAGNOSIS — K2901 Acute gastritis with bleeding: Secondary | ICD-10-CM

## 2019-04-02 LAB — CBC
HCT: 31.2 % — ABNORMAL LOW (ref 39.0–52.0)
Hemoglobin: 9.6 g/dL — ABNORMAL LOW (ref 13.0–17.0)
MCH: 23.7 pg — ABNORMAL LOW (ref 26.0–34.0)
MCHC: 30.8 g/dL (ref 30.0–36.0)
MCV: 77 fL — ABNORMAL LOW (ref 80.0–100.0)
Platelets: 710 10*3/uL — ABNORMAL HIGH (ref 150–400)
RBC: 4.05 MIL/uL — ABNORMAL LOW (ref 4.22–5.81)
RDW: 19.9 % — ABNORMAL HIGH (ref 11.5–15.5)
WBC: 13.8 10*3/uL — ABNORMAL HIGH (ref 4.0–10.5)
nRBC: 0 % (ref 0.0–0.2)

## 2019-04-02 LAB — BASIC METABOLIC PANEL
Anion gap: 12 (ref 5–15)
BUN: 9 mg/dL (ref 6–20)
CO2: 26 mmol/L (ref 22–32)
Calcium: 9.5 mg/dL (ref 8.9–10.3)
Chloride: 100 mmol/L (ref 98–111)
Creatinine, Ser: 0.87 mg/dL (ref 0.61–1.24)
GFR calc Af Amer: >60 mL/min (ref >60)
GFR calc non Af Amer: >60 mL/min (ref >60)
Glucose, Bld: 129 mg/dL — ABNORMAL HIGH (ref 70–99)
Potassium: 3.8 mmol/L (ref 3.5–5.1)
Sodium: 138 mmol/L (ref 135–145)

## 2019-04-02 MED ORDER — PANTOPRAZOLE SODIUM 40 MG IV SOLR
40.0000 mg | Freq: Two times a day (BID) | INTRAVENOUS | Status: DC
  Administered 2019-04-02 – 2019-04-07 (×10): 40 mg via INTRAVENOUS
  Filled 2019-04-02 (×10): qty 40

## 2019-04-02 MED ORDER — HYDROMORPHONE HCL 1 MG/ML IJ SOLN
1.0000 mg | Freq: Four times a day (QID) | INTRAMUSCULAR | Status: DC
  Administered 2019-04-02 – 2019-04-03 (×3): 1 mg via INTRAVENOUS
  Filled 2019-04-02 (×3): qty 1

## 2019-04-02 MED ORDER — DEXTROSE-NACL 5-0.45 % IV SOLN
INTRAVENOUS | Status: DC
  Administered 2019-04-02 – 2019-04-03 (×2): via INTRAVENOUS

## 2019-04-02 NOTE — Progress Notes (Addendum)
Subjective: Patient tearful, wanting girlfriend to come stay the night with him. Having worsening abdominal pain. No more N/V. Is having reflux symptoms. Last BM this morning or yesterday evening. No other GI complaints. Explained the need for NGT and surgery to come see him tomorrow. He verbalized understanding.  Objective: Vital signs in last 24 hours: Temp:  [98.1 F (36.7 C)-98.9 F (37.2 C)] 98.4 F (36.9 C) (10/28 1400) Pulse Rate:  [85-115] 91 (10/28 1400) Resp:  [13-23] 18 (10/28 1400) BP: (84-126)/(56-92) 123/92 (10/28 1400) SpO2:  [96 %-100 %] 99 % (10/28 1400) Last BM Date: 04/01/19 General:   Alert and oriented, pleasant Head:  Normocephalic and atraumatic. Eyes:  No icterus, sclera clear. Conjuctiva pink.  Heart:  S1, S2 present, no murmurs noted.  Lungs: Clear to auscultation bilaterally, without wheezing, rales, or rhonchi.  Abdomen:  Bowel sounds present, soft, non-tender, non-distended. No HSM or hernias noted. No rebound or guarding. No masses appreciated  Msk:  Symmetrical without gross deformities. Pulses:  Normal pulses noted. Extremities:  Without clubbing or edema. Neurologic:  Alert and  oriented x4 Psych:  Alert and cooperative. Normal mood and affect.  Intake/Output from previous day: No intake/output data recorded. Intake/Output this shift: Total I/O In: 400 [P.O.:400] Out: -   Lab Results: Recent Labs    03/31/19 1538 04/01/19 0621 04/02/19 0723  WBC 6.3 11.2* 13.8*  HGB 6.8* 9.0* 9.6*  HCT 22.7* 29.2* 31.2*  PLT 645* 776* 710*   BMET Recent Labs    03/31/19 0613 04/02/19 0723  NA 137 138  K 3.7 3.8  CL 104 100  CO2 26 26  GLUCOSE 107* 129*  BUN 5* 9  CREATININE 0.71 0.87  CALCIUM 8.7* 9.5   LFT No results for input(s): PROT, ALBUMIN, AST, ALT, ALKPHOS, BILITOT, BILIDIR, IBILI in the last 72 hours. PT/INR No results for input(s): LABPROT, INR in the last 72 hours. Hepatitis Panel No results for input(s): HEPBSAG, HCVAB,  HEPAIGM, HEPBIGM in the last 72 hours.   Studies/Results: Dg Ugi W Double Cm (hd Ba)  Result Date: 04/02/2019 CLINICAL DATA:  Severe nausea and vomiting, gastric distention, retained food debris on endoscopy, question gastric outlet obstruction; history of pancreatitis, GERD, smoking, alcohol abuse EXAM: UPPER GI SERIES WITH KUB TECHNIQUE: After obtaining a scout radiograph a routine upper GI series was performed using thin and high density barium. FLUOROSCOPY TIME:  Fluoroscopy Time:  0 minutes 42 seconds Radiation Exposure Index (if provided by the fluoroscopic device): 44.8 mGy Number of Acquired Spot Images: 6 plus multiple fluoroscopic screen captures COMPARISON:  03/30/2019 FINDINGS: Marked gastric distention on scout image. Stool throughout colon. Nonobstructive bowel gas pattern. Esophagus normal appearance, demonstrate normal distention and motility. No esophageal narrowing or mucosal/wall irregularity. Marked gastric distension. Some retained food debris. Many of the rugal folds appear effaced due to degree of gastric distention. Observed rugal folds normal appearance. No definite mass or ulceration. Several images demonstrate apparent contrast within the duodenal bulb though no passage of contrast beyond this site is identified on a 2 hour delayed image. Gastroesophageal reflux noted. IMPRESSION: Marked distention of the stomach. Small amount of contrast is identified within the duodenal wall but no further passage of contrast into the duodenum or small bowel loops is identified by 2 hours consistent with obstruction. Gastroesophageal reflux. Electronically Signed   By: Lavonia Dana M.D.   On: 04/02/2019 12:31    Assessment: 33 year old male with history of chronic pancreatitis due to ETOH, esophagitis on  prior CT imaging, chronic abdominal pain, N/V, presented with profound anemia and subjective reports of possible hematemesis. Received 1 unit PRBCs with improvement in Hgb to 7.5 from 6.2, then  drifting to 7 yesterday morning.  Heme negative and no reports of melena or hematochezia. Lipase 159 but no CT on admission: likely dealing with component of mild acute on chronic pancreatitis.  Anemia: With notable drop in Hgb from several weeks ago (9.2), recommended diagnostic EGD during admission. Although he reports possible hematemesis, none has been observed while admitted. EGD completed yesterday and found LA Grade D esophagitis and significant amount of retained gastric contents; EGD aborted after 1L suctioning due to aspiration risk. Hgb today improved to 9.6  N/V, Retained gastric contents: DG UGI today consistent with gastric outlet obstruction. Brief surgery note recommends NG tube now, plan for OR case Friday for gastrojejunostomy for decompression.   Pancreatitis standpoint: he notes improvement in abdominal pain and requesting regular food. Some concern for annular pancreas on imaging in Feb 2020. Continue with supportive measures and recommend resuming Creon when diet resumed. He will need outpatient dedicated imaging of pancreas as previously recommended in the past. Prior MRI had been arranged but not completed due to lack of finances per patient. ETOH cessation imperative.   Plan: 1. NPO 2. Has abdominal XRay at 1800 this evening 3. Discussed with hospitalist who will put in order for NGT if XRay ok (per surgery recommendations) 4. Anticipate surgery Friday 5. Protonix 40 mg IV bid 6. Supportive measures   Thank you for allowing [place] to participate in the care of [facility]  Walden Field, DNP, AGNP-C Adult & Gerontological Nurse Practitioner [facility]     LOS: 2 days    04/02/2019, 3:48 PM

## 2019-04-02 NOTE — Progress Notes (Signed)
Talked with radiology, stated to keep patient NPO for now due to radiologist has not read the UGI series.

## 2019-04-02 NOTE — Progress Notes (Signed)
[facility]  Gastric outlet obstruction on UGI and EGD with significant liquid in stomach. Needs gastrojejunostomy for decompression. NG now and NPO. Can do Friday in the OR. Will see patient tomorrow.   Curlene Labrum, MD [facility] [address] [place], [place] [address] [phone] (office)

## 2019-04-02 NOTE — Progress Notes (Signed)
Patient Demographics:    David Miranda, is a 33 y.o. male, DOB - [birth year], GNF:[id number]  Admit date - 03/30/2019   Admitting Physician Jawann Urbani Mariea Clonts, MD  Outpatient Primary MD for the patient is Patient, No Pcp Per  LOS - 2   Chief Complaint  Patient presents with   Abdominal Pain        Subjective:    David Miranda today has no fevers,   No chest pain,    -Continues with nausea and abdominal pain -Upper GI series noted  Assessment  & Plan :    Principal Problem:   Emesis, persistent Active Problems:   Acute alcoholic pancreatitis   Alcohol abuse   Tobacco abuse   Cannabis abuse   Esophagitis   Pancreatic pseudocyst/cyst--alcoholic pancreatitis   GI bleed  CT abdomen and pelvis from 03/18/2019 1)Marked distal esophageal thickening and mucosal hyperemia with some adjacent reactive free fluid in the lower mediastinum, consistent with esophagitis. 2. Markedly distended stomach with ingested material. 3. Multiple hypoattenuating cystic foci throughout the pancreaslikely reflect pancreatic pseudocyst/sequela of prior pancreatitis. The largest cystic pancreatic lesion measuring 1.8 cm  Brief summary -33 year old alcoholic male with history of esophagitis and recurrent pancreatitis with pseudocyst as well as chronic anemia admitted on 03/30/2019 with intractable emesis with [employer] looking contents, hemoglobin dropped to 6.2 required transfusion of 1 unit of packed cells on 03/30/2019, EGD from today 27 2020 with esophagitis and gastritis ,upper GI series from 04/02/2019 suggesting obstruction  A/p 1)Severe Alcoholic Esophagitis and Gastritis--continue IV Protonix as ordered, discussed with  v GI service, plan is for EGD on 04/01/2019\-EGD on 04/01/2019 with erosive esophagitis with bleeding, gastric and duodenal evaluation aborted due to significant amount of retained food in the stomach  despite patient being n.p.o -Continue Protonix twice daily, -UGI from 04/02/2019 suggesting obstruction -Surgical consult requested -As needed Zofran  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--continue, IV fluids, , as needed Zofran and Dilaudid -Continue Protonix -Elevated lipase but trending down, abdominal pain improving, nausea persist but no further emesis --Once oral intake resumes may resume Creon with meals -Patient is advised to have abdominal imaging surveillance of his pancreatic pseudocyst every 6 months for the next 2 years to confirm stability  3)History of alcohol Abuse--- patient states he has not had a drink for over a week PTA-continue lorazepam per CIWA protocol, folic acid and thiamine as ordered -No evidence of DTs at this time  4)Thrombocytosis--suspect this is reactive, hydrate, -Platelets are trending down   5) acute on chronic anemia--suspect due to acute blood loss in the setting of intractable emesis -Hemoglobin is up to 9.6 from 6.2 after transfusion of 2 unit of PRBC on 03/30/2019.  On admission hemoglobin was 8.0 .  It was 9.2 about 12 days PTA and   previous baseline of 11 -Continue to monitor H&H and transfuse as clinically indicated -Stool occult blood is negative -EGD as noted above with erosive esophagitis and bleeding   Disposition/Need for in-Hospital Stay- patient unable to be discharged at this time due to --concerns about GI bleed requiring transfusion and endoluminal evaluation compounded by esophagitis and pancreatitis-acquiring IV antiemetics and IV Dilaudid -UGI suggestive of possible GOO--surgical consult pending  Code Status : Full code  Family Communication:    (patient is alert, awake and coherent) -Discussed with wife  Disposition Plan  : Home once H&H stable after GI evaluation and general surgery evaluation is complete  Procedure- EGD on 04/01/2019 with erosive esophagitis with bleeding, gastric and  duodenal evaluation aborted due to significant amount of retained food in the stomach despite patient being n.p.o.  Procedure:- IMPRESSION: Marked distention of the stomach. Small amount of contrast is identified within the duodenal wall but no further passage of contrast into the duodenum or small bowel loops is identified by 2 hours consistent with obstruction. Gastroesophageal reflux.  Consults  :  Gi/general surgery  DVT Prophylaxis  :  SCDs (GI bleed)  Lab Results  Component Value Date   PLT 710 (H) 04/02/2019    Inpatient Medications  Scheduled Meds:  sodium chloride   Intravenous Once   folic acid  1 mg Oral Daily   lidocaine  5 mL Mouth/Throat Once   lipase/protease/amylase  72,000 Units Oral TID WC   multivitamin with minerals  1 tablet Oral Daily   ondansetron (ZOFRAN) IV  4 mg Intravenous TID WC & HS   pantoprazole  40 mg Oral BID AC   polyethylene glycol  17 g Oral Daily   senna-docusate  2 tablet Oral BID   sodium chloride flush  3 mL Intravenous Q12H   thiamine  100 mg Intravenous Daily   thiamine  100 mg Oral Daily   traZODone  100 mg Oral QHS   Continuous Infusions:  sodium chloride     dextrose 5 % and 0.45 % NaCl with KCl 20 mEq/L 50 mL/hr at 04/02/19 0605   dextrose 5 % and 0.45% NaCl     PRN Meds:.sodium chloride, acetaminophen **OR** acetaminophen, albuterol, alum & mag hydroxide-simeth, HYDROmorphone (DILAUDID) injection, LORazepam **OR** LORazepam, ondansetron **OR** [DISCONTINUED] ondansetron (ZOFRAN) IV, sodium chloride flush   Anti-infectives (From admission, onward)   None        Objective:   Vitals:   04/01/19 2021 04/02/19 0011 04/02/19 0609 04/02/19 0925  BP:  123/78 126/90 123/78  Pulse:  93 (!) 102 (!) 115  Resp:  20 20 20   Temp:  98.8 F (37.1 C) 98.8 F (37.1 C) 98.9 F (37.2 C)  TempSrc:  Oral Oral Oral  SpO2: 99% 99% 96% 97%  Weight:      Height:        Wt Readings from Last 3 Encounters:  04/01/19  71 kg  03/18/19 77.1 kg  07/29/18 80.1 kg     Intake/Output Summary (Last 24 hours) at 04/02/2019 1402 Last data filed at 04/02/2019 0900 Gross per 24 hour  Intake 400 ml  Output 0 ml  Net 400 ml     Physical Exam  Gen:- Awake Alert,   HEENT:- [place].AT, No sclera icterus Neck-Supple Neck,No JVD,.  Lungs-  CTAB , fair symmetrical air movement CV- S1, S2 normal, regular  Abd-  +ve B.Sounds, Abd Soft, mild epigastric and left upper quadrant and periumbilical area tenderness, no rebound or guarding Extremity/Skin:- No  edema, pedal pulses present  Psych-affect is appropriate, oriented x3 Neuro-no new focal deficits, no tremors   Data Review:   Micro Results Recent Results (from the past 240 hour(s))  SARS CORONAVIRUS 2 (TAT 6-24 HRS) Nasopharyngeal Nasopharyngeal Swab     Status: None   Collection Time: 03/30/19  9:24 AM   Specimen: Nasopharyngeal Swab  Result Value Ref Range Status   SARS Coronavirus 2 NEGATIVE NEGATIVE Final  Comment: (NOTE) SARS-CoV-2 target nucleic acids are NOT DETECTED. The SARS-CoV-2 RNA is generally detectable in upper and lower respiratory specimens during the acute phase of infection. Negative results do not preclude SARS-CoV-2 infection, do not rule out co-infections with other pathogens, and should not be used as the sole basis for treatment or other patient management decisions. Negative results must be combined with clinical observations, patient history, and epidemiological information. The expected result is Negative. Fact Sheet for Patients: HairSlick.no Fact Sheet for Healthcare Providers: quierodirigir.com This test is not yet approved or cleared by the [place] FDA and  has been authorized for detection and/or diagnosis of SARS-CoV-2 by FDA under an Emergency Use Authorization (EUA). This EUA will remain  in effect (meaning this test can be used) for the duration of  the COVID-19 declaration under Section 56 4(b)(1) of the Act, 21 U.S.C. section 360bbb-3(b)(1), unless the authorization is terminated or revoked sooner. Performed at [facility], [address], [place], [place] [address]     Radiology Reports Ct Abdomen Pelvis W Contrast  Result Date: 03/18/2019 CLINICAL DATA:  Emesis and vomiting for 4 days EXAM: CT ABDOMEN AND PELVIS WITH CONTRAST TECHNIQUE: Multidetector CT imaging of the abdomen and pelvis was performed using the standard protocol following bolus administration of intravenous contrast. CONTRAST:  OMNIPAQUE IOHEXOL 300 MG/ML  SOLN COMPARISON:  CT 07/26/2018 FINDINGS: Lower chest: Dependent atelectasis. Lung bases are otherwise clear. Normal heart size. No pericardial effusion. Hepatobiliary: No focal liver abnormality is seen. No gallstones, gallbladder wall thickening, or biliary dilatation. 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 Largest fluid attenuation cystic lesion measures up to 1.8 cm at the pancreatic head neck junction. Note definitive connection to the main pancreatic duct. No pancreatic ductal dilatation or peripancreatic inflammation is present at this time. Spleen: Normal in size without focal abnormality. Adrenals/Urinary Tract: Normal adrenal glands. Few fluid attenuation cysts in the kidneys are unchanged from prior. No concerning renal lesions. No hydronephrosis or urolithiasis. Normal urinary bladder. Stomach/Bowel: Marked distal esophageal thickening and mucosal hyperemia with some adjacent reactive free fluid in the lower mediastinum. The stomach is markedly distended with ingested material. Duodenal sweep takes a normal anatomic course. No small bowel dilatation or wall thickening. A normal appendix is visualized. No colonic dilatation or wall thickening. Vascular/Lymphatic: The aorta is normal caliber. Few reactive  upper abdominal lymph nodes. No pathologically enlarged abdominopelvic nodes. Reproductive: The prostate and seminal vesicles are unremarkable. Other: No abdominopelvic free fluid or free gas. No bowel containing hernias. Musculoskeletal: No acute osseous abnormality or suspicious osseous lesion. IMPRESSION: 1. Marked distal esophageal thickening and mucosal hyperemia with some adjacent reactive free fluid in the lower mediastinum, consistent with esophagitis. 2. Markedly distended stomach with ingested material. 3. 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 No pancreatic ductal dilatation or peripancreatic inflammation at this time. The largest cystic pancreatic lesion measuring 1.8 cm assess detailed six-month follow-up imaging for at least 2 years of stability. This recommendation follows ACR consensus guidelines: Management of Incidental Pancreatic Cysts: A White Paper of the ACR Incidental Findings Committee. J Am Coll Radiol 2017;14:911-923. Electronically Signed   By: Kreg Shropshire M.D.   On: 03/18/2019 06:13   Dg Abdomen Acute W/chest  Result Date: 03/30/2019 CLINICAL DATA:  Abdominal pain and nausea EXAM: DG ABDOMEN ACUTE W/ 1V CHEST COMPARISON:  Abdomen series March 26, 2018 FINDINGS: PA chest: No edema  or consolidation. Heart size and pulmonary vascularity are normal. No adenopathy. Supine and upright abdomen: There is moderate stool in the colon. There is no bowel dilatation or air-fluid level to suggest bowel obstruction. No free air. No abnormal calcifications. IMPRESSION: Moderate stool in colon. No bowel obstruction or free air evident. Lungs clear. Electronically Signed   By: Bretta Bang III M.D.   On: 03/30/2019 08:26   Dg Kayleen Memos W Double Cm (hd Ba)  Result Date: 04/02/2019 CLINICAL DATA:  Severe nausea and vomiting, gastric distention, retained food debris on endoscopy, question gastric outlet  obstruction; history of pancreatitis, GERD, smoking, alcohol abuse EXAM: UPPER GI SERIES WITH KUB TECHNIQUE: After obtaining a scout radiograph a routine upper GI series was performed using thin and high density barium. FLUOROSCOPY TIME:  Fluoroscopy Time:  0 minutes 42 seconds Radiation Exposure Index (if provided by the fluoroscopic device): 44.8 mGy Number of Acquired Spot Images: 6 plus multiple fluoroscopic screen captures COMPARISON:  03/30/2019 FINDINGS: Marked gastric distention on scout image. Stool throughout colon. Nonobstructive bowel gas pattern. Esophagus normal appearance, demonstrate normal distention and motility. No esophageal narrowing or mucosal/wall irregularity. Marked gastric distension. Some retained food debris. Many of the rugal folds appear effaced due to degree of gastric distention. Observed rugal folds normal appearance. No definite mass or ulceration. Several images demonstrate apparent contrast within the duodenal bulb though no passage of contrast beyond this site is identified on a 2 hour delayed image. Gastroesophageal reflux noted. IMPRESSION: Marked distention of the stomach. Small amount of contrast is identified within the duodenal wall but no further passage of contrast into the duodenum or small bowel loops is identified by 2 hours consistent with obstruction. Gastroesophageal reflux. Electronically Signed   By: Ulyses Southward M.D.   On: 04/02/2019 12:31     CBC Recent Labs  Lab 03/30/19 0737  03/31/19 0003 03/31/19 0613 03/31/19 1538 04/01/19 0621 04/02/19 0723  WBC 10.0   < > 9.6 8.0 6.3 11.2* 13.8*  HGB 8.0*   < > 7.5* 7.0* 6.8* 9.0* 9.6*  HCT 26.3*   < > 25.0* 23.5* 22.7* 29.2* 31.2*  PLT 1,152*   < > 811* 761* 645* 776* 710*  MCV 72.9*   < > 76.2* 75.8* 75.2* 77.7* 77.0*  MCH 22.2*   < > 22.9* 22.6* 22.5* 23.9* 23.7*  MCHC 30.4   < > 30.0 29.8* 30.0 30.8 30.8  RDW 17.4*   < > 18.4* 18.3* 18.3* 19.9* 19.9*  LYMPHSABS 1.3  --   --   --   --   --   --     MONOABS 0.9  --   --   --   --   --   --   EOSABS 0.1  --   --   --   --   --   --   BASOSABS 0.0  --   --   --   --   --   --    < > = values in this interval not displayed.    Chemistries  Recent Labs  Lab 03/30/19 0737 03/31/19 0613 04/02/19 0723  NA 139 137 138  K 2.7* 3.7 3.8  CL 88* 104 100  CO2 33* 26 26  GLUCOSE 195* 107* 129*  BUN 16 5* 9  CREATININE 1.07 0.71 0.87  CALCIUM 10.1 8.7* 9.5  MG 2.0  --   --   AST 20  --   --   ALT 12  --   --  ALKPHOS 84  --   --   BILITOT 0.5  --   --    ------------------------------------------------------------------------------------------------------------------ No results for input(s): CHOL, HDL, LDLCALC, TRIG, CHOLHDL, LDLDIRECT in the last 72 hours.  Lab Results  Component Value Date   HGBA1C 5.2 12/27/2017   ------------------------------------------------------------------------------------------------------------------ No results for input(s): TSH, T4TOTAL, T3FREE, THYROIDAB in the last 72 hours.  Invalid input(s): FREET3 ------------------------------------------------------------------------------------------------------------------ No results for input(s): VITAMINB12, FOLATE, FERRITIN, TIBC, IRON, RETICCTPCT in the last 72 hours.  Coagulation profile No results for input(s): INR, PROTIME in the last 168 hours.  No results for input(s): DDIMER in the last 72 hours.  Cardiac Enzymes No results for input(s): CKMB, TROPONINI, MYOGLOBIN in the last 168 hours.  Invalid input(s): CK ------------------------------------------------------------------------------------------------------------------ No results found for: BNP  Shon Hale M.D on 04/02/2019 at 2:02 PM  Go to [email or web address] - for contact info  [facility] - Office  [phone]

## 2019-04-03 ENCOUNTER — Encounter (HOSPITAL_COMMUNITY): Payer: Self-pay | Admitting: Gastroenterology

## 2019-04-03 DIAGNOSIS — K311 Adult hypertrophic pyloric stenosis: Secondary | ICD-10-CM

## 2019-04-03 MED ORDER — LORAZEPAM 2 MG/ML IJ SOLN
1.0000 mg | Freq: Four times a day (QID) | INTRAMUSCULAR | Status: DC | PRN
  Administered 2019-04-03 – 2019-04-08 (×9): 1 mg via INTRAVENOUS
  Filled 2019-04-03 (×10): qty 1

## 2019-04-03 MED ORDER — HYDROMORPHONE HCL 1 MG/ML IJ SOLN
1.0000 mg | INTRAMUSCULAR | Status: DC | PRN
  Administered 2019-04-03 – 2019-04-08 (×29): 1 mg via INTRAVENOUS
  Filled 2019-04-03 (×31): qty 1

## 2019-04-03 MED ORDER — LACTATED RINGERS IV SOLN
Freq: Once | INTRAVENOUS | Status: AC
  Administered 2019-04-04 (×2): via INTRAVENOUS

## 2019-04-03 MED ORDER — CEFAZOLIN SODIUM-DEXTROSE 2-4 GM/100ML-% IV SOLN
2.0000 g | INTRAVENOUS | Status: AC
  Administered 2019-04-04: 2 g via INTRAVENOUS

## 2019-04-03 MED ORDER — PHENOL 1.4 % MT LIQD
1.0000 | OROMUCOSAL | Status: DC | PRN
  Administered 2019-04-03: 1 via OROMUCOSAL
  Filled 2019-04-03: qty 177

## 2019-04-03 MED ORDER — CHLORHEXIDINE GLUCONATE CLOTH 2 % EX PADS
6.0000 | MEDICATED_PAD | Freq: Once | CUTANEOUS | Status: AC
  Administered 2019-04-03: 6 via TOPICAL

## 2019-04-03 MED ORDER — LORAZEPAM 2 MG/ML IJ SOLN
1.0000 mg | Freq: Once | INTRAMUSCULAR | Status: AC
  Administered 2019-04-03: 1 mg via INTRAVENOUS
  Filled 2019-04-03: qty 1

## 2019-04-03 MED ORDER — CHLORHEXIDINE GLUCONATE CLOTH 2 % EX PADS: 6.0000 | MEDICATED_PAD | Freq: Once | CUTANEOUS | Status: DC

## 2019-04-03 NOTE — Progress Notes (Signed)
Patient Demographics:    David Miranda, is a 33 y.o. male, DOB - 1986/01/24, EXB:[id number]  Admit date - 03/30/2019   Admitting Physician Tigran Haynie Mariea Clonts, MD  Outpatient Primary MD for the patient is Patient, No Pcp Per  LOS - 3   Chief Complaint  Patient presents with  . Abdominal Pain        Subjective:    David Miranda today has no fevers,   No chest pain,    - Resting comfortably after NG tube placement  Assessment  & Plan :    Principal Problem:   Emesis, persistent Active Problems:   Acute alcoholic pancreatitis   Alcohol abuse   Tobacco abuse   Cannabis abuse   Esophagitis   Pancreatic pseudocyst/cyst--alcoholic pancreatitis   GI bleed   Abdominal pain   Gastric outlet obstruction  CT abdomen and pelvis from 03/18/2019 1)Marked distal esophageal thickening and mucosal hyperemia with some adjacent reactive free fluid in the lower mediastinum, consistent with esophagitis. 2. Markedly distended stomach with ingested material. 3. Multiple hypoattenuating cystic foci throughout the pancreaslikely reflect pancreatic pseudocyst/sequela of prior pancreatitis. The largest cystic pancreatic lesion measuring 1.8 cm  Brief summary -33 year old alcoholic male with history of esophagitis and recurrent pancreatitis with pseudocyst as well as chronic anemia admitted on 03/30/2019 with intractable emesis with [employer] looking contents, hemoglobin dropped to 6.2 required transfusion of 1 unit of packed cells on 03/30/2019, EGD from today 27 2020 with esophagitis and gastritis ,upper GI series from 04/02/2019 suggesting obstruction, NG tube placed 04/03/2019 plan for gastrojejunostomy on 04/04/2019  A/p 1)Severe Alcoholic Esophagitis and Gastritis/GOO --continue IV Protonix as ordered, discussed with  v GI service, plan is for EGD on 04/01/2019\-EGD on 04/01/2019 with erosive esophagitis with  bleeding, gastric and duodenal evaluation aborted due to significant amount of retained food in the stomach despite patient being n.p.o -Continue Protonix twice daily, -UGI from 04/02/2019 suggesting obstruction -Surgical consult appreciated -As needed Zofran NG tube placed 04/03/2019 plan is for gastrojejunostomy for decompression on 04/04/2019, gastic outlet obstruction presumed 2/2 chronic pancreatitis with pseudocyst.    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--continue, IV fluids, , as needed Zofran and Dilaudid -Continue Protonix -Elevated lipase but trending down, abdominal pain improving, nausea persist but no further emesis --Once oral intake resumes may resume Creon with meals -Patient is advised to have abdominal imaging surveillance of his pancreatic pseudocyst every 6 months for the next 2 years to confirm stability  3)History of alcohol Abuse--- patient states he has not had a drink for over a week PTA-continue lorazepam per CIWA protocol, folic acid and thiamine as ordered -No evidence of DTs at this time  4)Thrombocytosis--suspect this is reactive, hydrate, -Platelets are trending down   5) acute on chronic anemia--suspect due to acute blood loss in the setting of intractable emesis -Hemoglobin is up to 9.6 from 6.2 after transfusion of 2 unit of PRBC on 03/30/2019.  On admission hemoglobin was 8.0 .  It was 9.2 about 12 days PTA and   previous baseline of 11 -Continue to monitor H&H and transfuse as clinically indicated -Stool occult blood is negative -EGD as noted above with erosive esophagitis and bleeding   Disposition/Need for in-Hospital Stay- patient unable to be  discharged at this time due to --concerns about GI bleed requiring transfusion and endoluminal evaluation compounded by esophagitis and pancreatitis-acquiring IV antiemetics and IV Dilaudid -For gastrojejunostomy on 04/04/2019  Code Status : Full code  Family  Communication:    (patient is alert, awake and coherent) -Discussed with wife  Disposition Plan  : Home once H&H stable after GI evaluation and general surgery evaluation is complete  Procedure- EGD on 04/01/2019 with erosive esophagitis with bleeding, gastric and duodenal evaluation aborted due to significant amount of retained food in the stomach despite patient being n.p.o.  Procedure:- 04/02/2019-UGI IMPRESSION: Marked distention of the stomach. Small amount of contrast is identified within the duodenal wall but no further passage of contrast into the duodenum or small bowel loops is identified by 2 hours consistent with obstruction. Gastroesophageal reflux.  Consults  :  Gi/general surgery  DVT Prophylaxis  :  SCDs (GI bleed)  Lab Results  Component Value Date   PLT 710 (H) 04/02/2019    Inpatient Medications  Scheduled Meds: . sodium chloride   Intravenous Once  . lidocaine  5 mL Mouth/Throat Once  . ondansetron (ZOFRAN) IV  4 mg Intravenous TID WC & HS  . pantoprazole (PROTONIX) IV  40 mg Intravenous Q12H  . sodium chloride flush  3 mL Intravenous Q12H  . thiamine  100 mg Intravenous Daily  . traZODone  100 mg Oral QHS   Continuous Infusions: . sodium chloride    . dextrose 5 % and 0.45 % NaCl with KCl 20 mEq/L 100 mL/hr at 04/03/19 1053   PRN Meds:.sodium chloride, albuterol, alum & mag hydroxide-simeth, HYDROmorphone (DILAUDID) injection, LORazepam, ondansetron **OR** [DISCONTINUED] ondansetron (ZOFRAN) IV, phenol, sodium chloride flush   Anti-infectives (From admission, onward)   None        Objective:   Vitals:   04/02/19 1400 04/02/19 1600 04/03/19 0559 04/03/19 1357  BP: (!) 123/92 110/78 109/86 120/81  Pulse: 91 77 89 68  Resp: 18 18 17 20   Temp: 98.4 F (36.9 C) 98.6 F (37 C) 98.8 F (37.1 C) 98 F (36.7 C)  TempSrc: Oral Oral  Oral  SpO2: 99% 100% 100% 100%  Weight:      Height:        Wt Readings from Last 3 Encounters:  04/01/19  71 kg  03/18/19 77.1 kg  07/29/18 80.1 kg     Intake/Output Summary (Last 24 hours) at 04/03/2019 1558 Last data filed at 04/03/2019 0700 Gross per 24 hour  Intake 1163.27 ml  Output -  Net 1163.27 ml     Physical Exam  Gen:- Awake Alert,   HEENT:- [place].AT, No sclera icterus Nose- NG tube-to intermittent wall suction Neck-Supple Neck,No JVD,.  Lungs-  CTAB , fair symmetrical air movement CV- S1, S2 normal, regular  Abd-  +ve B.Sounds, Abd Soft, mild epigastric and left upper quadrant and periumbilical area tenderness, no rebound or guarding Extremity/Skin:- No  edema, pedal pulses present  Psych-affect is appropriate, oriented x3 Neuro-no new focal deficits, no tremors   Data Review:   Micro Results Recent Results (from the past 240 hour(s))  SARS CORONAVIRUS 2 (TAT 6-24 HRS) Nasopharyngeal Nasopharyngeal Swab     Status: None   Collection Time: 03/30/19  9:24 AM   Specimen: Nasopharyngeal Swab  Result Value Ref Range Status   SARS Coronavirus 2 NEGATIVE NEGATIVE Final    Comment: (NOTE) SARS-CoV-2 target nucleic acids are NOT DETECTED. The SARS-CoV-2 RNA is generally detectable in upper and lower respiratory specimens  during the acute phase of infection. Negative results do not preclude SARS-CoV-2 infection, do not rule out co-infections with other pathogens, and should not be used as the sole basis for treatment or other patient management decisions. Negative results must be combined with clinical observations, patient history, and epidemiological information. The expected result is Negative. Fact Sheet for Patients: HairSlick.no Fact Sheet for Healthcare Providers: quierodirigir.com This test is not yet approved or cleared by the [place] FDA and  has been authorized for detection and/or diagnosis of SARS-CoV-2 by FDA under an Emergency Use Authorization (EUA). This EUA will remain  in effect (meaning  this test can be used) for the duration of the COVID-19 declaration under Section 56 4(b)(1) of the Act, 21 U.S.C. section 360bbb-3(b)(1), unless the authorization is terminated or revoked sooner. Performed at [facility], [address] [place], [place] [address]     Radiology Reports Dg Abd 1 View  Result Date: 04/02/2019 CLINICAL DATA:  Abdominal pain following upper GI EXAM: ABDOMEN - 1 VIEW COMPARISON:  None. FINDINGS: Scattered large and small bowel gas is noted. Contrast material remains within the stomach from the recent upper GI. Some contrast has passed into the more distal small bowel. No contrast is noted within the colon. No obstructive changes are seen. IMPRESSION: Contrast material within the more distal small bowel. No obstructive changes are noted. Considerable retained contrast is noted within the stomach Electronically Signed   By: Alcide Clever M.D.   On: 04/02/2019 20:10   Ct Abdomen Pelvis W Contrast  Result Date: 03/18/2019 CLINICAL DATA:  Emesis and vomiting for 4 days EXAM: CT ABDOMEN AND PELVIS WITH CONTRAST TECHNIQUE: Multidetector CT imaging of the abdomen and pelvis was performed using the standard protocol following bolus administration of intravenous contrast. CONTRAST:  OMNIPAQUE IOHEXOL 300 MG/ML  SOLN COMPARISON:  CT 07/26/2018 FINDINGS: Lower chest: Dependent atelectasis. Lung bases are otherwise clear. Normal heart size. No pericardial effusion. Hepatobiliary: No focal liver abnormality is seen. No gallstones, gallbladder wall thickening, or biliary dilatation. 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 Largest fluid attenuation cystic lesion measures up to 1.8 cm at the pancreatic head neck junction. Note definitive connection to the main pancreatic duct. No pancreatic ductal dilatation or peripancreatic inflammation is present at this time. Spleen:  Normal in size without focal abnormality. Adrenals/Urinary Tract: Normal adrenal glands. Few fluid attenuation cysts in the kidneys are unchanged from prior. No concerning renal lesions. No hydronephrosis or urolithiasis. Normal urinary bladder. Stomach/Bowel: Marked distal esophageal thickening and mucosal hyperemia with some adjacent reactive free fluid in the lower mediastinum. The stomach is markedly distended with ingested material. Duodenal sweep takes a normal anatomic course. No small bowel dilatation or wall thickening. A normal appendix is visualized. No colonic dilatation or wall thickening. Vascular/Lymphatic: The aorta is normal caliber. Few reactive upper abdominal lymph nodes. No pathologically enlarged abdominopelvic nodes. Reproductive: The prostate and seminal vesicles are unremarkable. Other: No abdominopelvic free fluid or free gas. No bowel containing hernias. Musculoskeletal: No acute osseous abnormality or suspicious osseous lesion. IMPRESSION: 1. Marked distal esophageal thickening and mucosal hyperemia with some adjacent reactive free fluid in the lower mediastinum, consistent with esophagitis. 2. Markedly distended stomach with ingested material. 3. 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 No pancreatic ductal dilatation or peripancreatic inflammation at this time. The largest cystic pancreatic lesion measuring 1.8 cm assess detailed six-month follow-up  imaging for at least 2 years of stability. This recommendation follows ACR consensus guidelines: Management of Incidental Pancreatic Cysts: A White Paper of the ACR Incidental Findings Committee. J Am Coll Radiol 2017;14:911-923. Electronically Signed   By: Kreg Shropshire M.D.   On: 03/18/2019 06:13   Dg Abdomen Acute W/chest  Result Date: 03/30/2019 CLINICAL DATA:  Abdominal pain and nausea EXAM: DG ABDOMEN ACUTE W/ 1V CHEST COMPARISON:  Abdomen  series March 26, 2018 FINDINGS: PA chest: No edema or consolidation. Heart size and pulmonary vascularity are normal. No adenopathy. Supine and upright abdomen: There is moderate stool in the colon. There is no bowel dilatation or air-fluid level to suggest bowel obstruction. No free air. No abnormal calcifications. IMPRESSION: Moderate stool in colon. No bowel obstruction or free air evident. Lungs clear. Electronically Signed   By: Bretta Bang III M.D.   On: 03/30/2019 08:26   Dg Kayleen Memos W Double Cm (hd Ba)  Result Date: 04/02/2019 CLINICAL DATA:  Severe nausea and vomiting, gastric distention, retained food debris on endoscopy, question gastric outlet obstruction; history of pancreatitis, GERD, smoking, alcohol abuse EXAM: UPPER GI SERIES WITH KUB TECHNIQUE: After obtaining a scout radiograph a routine upper GI series was performed using thin and high density barium. FLUOROSCOPY TIME:  Fluoroscopy Time:  0 minutes 42 seconds Radiation Exposure Index (if provided by the fluoroscopic device): 44.8 mGy Number of Acquired Spot Images: 6 plus multiple fluoroscopic screen captures COMPARISON:  03/30/2019 FINDINGS: Marked gastric distention on scout image. Stool throughout colon. Nonobstructive bowel gas pattern. Esophagus normal appearance, demonstrate normal distention and motility. No esophageal narrowing or mucosal/wall irregularity. Marked gastric distension. Some retained food debris. Many of the rugal folds appear effaced due to degree of gastric distention. Observed rugal folds normal appearance. No definite mass or ulceration. Several images demonstrate apparent contrast within the duodenal bulb though no passage of contrast beyond this site is identified on a 2 hour delayed image. Gastroesophageal reflux noted. IMPRESSION: Marked distention of the stomach. Small amount of contrast is identified within the duodenal wall but no further passage of contrast into the duodenum or small bowel loops is  identified by 2 hours consistent with obstruction. Gastroesophageal reflux. Electronically Signed   By: Ulyses Southward M.D.   On: 04/02/2019 12:31     CBC Recent Labs  Lab 03/30/19 0737  03/31/19 0003 03/31/19 0613 03/31/19 1538 04/01/19 0621 04/02/19 0723  WBC 10.0   < > 9.6 8.0 6.3 11.2* 13.8*  HGB 8.0*   < > 7.5* 7.0* 6.8* 9.0* 9.6*  HCT 26.3*   < > 25.0* 23.5* 22.7* 29.2* 31.2*  PLT 1,152*   < > 811* 761* 645* 776* 710*  MCV 72.9*   < > 76.2* 75.8* 75.2* 77.7* 77.0*  MCH 22.2*   < > 22.9* 22.6* 22.5* 23.9* 23.7*  MCHC 30.4   < > 30.0 29.8* 30.0 30.8 30.8  RDW 17.4*   < > 18.4* 18.3* 18.3* 19.9* 19.9*  LYMPHSABS 1.3  --   --   --   --   --   --   MONOABS 0.9  --   --   --   --   --   --   EOSABS 0.1  --   --   --   --   --   --   BASOSABS 0.0  --   --   --   --   --   --    < > = values in  this interval not displayed.    Chemistries  Recent Labs  Lab 03/30/19 0737 03/31/19 0613 04/02/19 0723  NA 139 137 138  K 2.7* 3.7 3.8  CL 88* 104 100  CO2 33* 26 26  GLUCOSE 195* 107* 129*  BUN 16 5* 9  CREATININE 1.07 0.71 0.87  CALCIUM 10.1 8.7* 9.5  MG 2.0  --   --   AST 20  --   --   ALT 12  --   --   ALKPHOS 84  --   --   BILITOT 0.5  --   --    ------------------------------------------------------------------------------------------------------------------ No results for input(s): CHOL, HDL, LDLCALC, TRIG, CHOLHDL, LDLDIRECT in the last 72 hours.  Lab Results  Component Value Date   HGBA1C 5.2 12/27/2017   ------------------------------------------------------------------------------------------------------------------ No results for input(s): TSH, T4TOTAL, T3FREE, THYROIDAB in the last 72 hours.  Invalid input(s): FREET3 ------------------------------------------------------------------------------------------------------------------ No results for input(s): VITAMINB12, FOLATE, FERRITIN, TIBC, IRON, RETICCTPCT in the last 72 hours.  Coagulation profile No  results for input(s): INR, PROTIME in the last 168 hours.  No results for input(s): DDIMER in the last 72 hours.  Cardiac Enzymes No results for input(s): CKMB, TROPONINI, MYOGLOBIN in the last 168 hours.  Invalid input(s): CK ------------------------------------------------------------------------------------------------------------------ No results found for: BNP  Shon Hale M.D on 04/03/2019 at 3:58 PM  Go to [email or web address] - for contact info  [facility] - Office  [phone]

## 2019-04-03 NOTE — H&P (Signed)
[facility] Consult  Reason for Consult: Gastric Outlet Obstruction Referring Physician: Roxan Hockey, MD  Chief Complaint    Abdominal Pain     HPI: David Miranda is a 33 y.o. male with PMH chronic GERD and pancreatitis 2/2 alcohol use disorder admitted four days ago for retrosternal and LUQ pain, nausea, and dark red-brown vomiting. paitent was admitted for similar symptoms two weeks ago (10/18) but states that he had not had similar symptoms prior to that event. Patient states that the pain is intermittent but severe, and that the nausea and vomiting begins after the pain subsides. Symptoms are worsened by eating, and currently controlled to some extent with dilaudid. Patient denies nausea, fatigue, changes to BMs.  Past history of extensive alcohol use of ~1 pint of liquor daily, which he cut back about 2 weeks prior to this hospitalization to a shot in the mornings to prevent withdrawal shakes. He expresses a desire to quit drinking. He also has a history of chronic pancreatitis for which he has been hospitalized several times over the past 2 years. He has been unable to see a PCP, get an EGD outpatient, or continue taking pancreatic enzymes at home as prescribed due to financial barriers and lack of insurance. He recently spent a month in jail because he was unable to pay a fine.   On admission, patient was anemic to Hbg of 6.2; he received 1u PRBCs and his Hbg is currently trending up at 9.6. Lipase was elevated to 159 and continues to be elevated to 80. He received EGD 10/27 which was stopped part of the way through due to retained food in the stomach despite the patient being NPO. This showed gastric outlet obstruction. Upper Gi series with KUB confirmed obstruction and also showed GERD. NG tube was placed this morning after I interviewed the patient.   Past Medical History:  Diagnosis Date  . GERD (gastroesophageal reflux disease)   . Pancreatitis     Past Surgical  History:  Procedure Laterality Date  . ESOPHAGOGASTRODUODENOSCOPY (EGD) WITH PROPOFOL N/A 04/01/2019   Procedure: ESOPHAGOGASTRODUODENOSCOPY (EGD) WITH PROPOFOL;  Surgeon: Danie Binder, MD;  Location: [facility]ITE;  Service: Endoscopy;  Laterality: N/A;  . NO PAST SURGERIES      Family History  Problem Relation Age of Onset  . Cancer Other     Social History   Tobacco Use  . Smoking status: Current Every Day Smoker    Packs/day: 1.00    Types: Cigarettes  . Smokeless tobacco: Never Used  Substance Use Topics  . Alcohol use: Yes    Comment: pint every few days  . Drug use: Not Currently    Types: Marijuana    Comment: occasionally    Medications: I have reviewed the patient's current medications.  No Known Allergies   ROS:  Pertinent items are noted in HPI.  Blood pressure 109/86, pulse 89, temperature 98.8 F (37.1 C), resp. rate 17, height 6' (1.829 m), weight 71 kg, SpO2 100 %. Physical Exam Constitutional:      General: He is not in acute distress.    Appearance: He is normal weight.  HENT:     Head: Normocephalic and atraumatic.  Cardiovascular:     Rate and Rhythm: Normal rate and regular rhythm.     Heart sounds: Normal heart sounds. No murmur. No friction rub. No gallop.   Pulmonary:     Effort: Pulmonary effort is normal.     Breath sounds: Normal breath sounds.  Abdominal:     General: Abdomen is scaphoid. Bowel sounds are normal. There is no distension.     Palpations: Abdomen is soft. There is no shifting dullness or fluid wave.     Tenderness: There is abdominal tenderness in the right upper quadrant, epigastric area and left upper quadrant.  Skin:    General: Skin is warm and dry.  Neurological:     General: No focal deficit present.     Mental Status: He is alert and oriented to person, place, and time.  Psychiatric:        Mood and Affect: Mood normal.     Results: Results for orders placed or performed during the hospital encounter  of 03/30/19 (from the past 48 hour(s))  CBC     Status: Abnormal   Collection Time: 04/02/19  7:23 AM  Result Value Ref Range   WBC 13.8 (H) 4.0 - 10.5 K/uL   RBC 4.05 (L) 4.22 - 5.81 MIL/uL   Hemoglobin 9.6 (L) 13.0 - 17.0 g/dL   HCT 31.2 (L) 39.0 - 52.0 %   MCV 77.0 (L) 80.0 - 100.0 fL   MCH 23.7 (L) 26.0 - 34.0 pg   MCHC 30.8 30.0 - 36.0 g/dL   RDW 19.9 (H) 11.5 - 15.5 %   Platelets 710 (H) 150 - 400 K/uL   nRBC 0.0 0.0 - 0.2 %    Comment: Performed at [facility], [address] [place], [place] [id number]  Basic metabolic panel     Status: Abnormal   Collection Time: 04/02/19  7:23 AM  Result Value Ref Range   Sodium 138 135 - 145 mmol/L   Potassium 3.8 3.5 - 5.1 mmol/L   Chloride 100 98 - 111 mmol/L   CO2 26 22 - 32 mmol/L   Glucose, Bld 129 (H) 70 - 99 mg/dL   BUN 9 6 - 20 mg/dL   Creatinine, Ser 0.87 0.61 - 1.24 mg/dL   Calcium 9.5 8.9 - 10.3 mg/dL   GFR calc non Af Amer >60 >60 mL/min   GFR calc Af Amer >60 >60 mL/min   Anion gap 12 5 - 15    Comment: Performed at [facility], [address], [place], [place] [address]   Imaging/studies:  EGD on 10/27 showed erosive esophagitis with bleeding, gastric and duodenal evaluation aborted due to significant amount of retained food in the stomach despite patient being n.p.o.  Barium gastric emptying study and Upper GI series with KUB (10/28) was consistent with GERD, as well as gastric outlet obstruction, showing marked distention of the stomach with small amount of contrast identified within the duodenal wall but no further passage of contrast into the duodenum or small bowel loops.  Abdominal xray (1-view, 10/28) showed contrast material within the more distal small bowel, with no obstructive changes noted. Considerable retained contrast within the stomach  Assessment & Plan:  David Miranda is a 33 y.o. male with gastic outlet obstruction 2/2 chronic pancreatitis with pseudocyst.  -Scheduled for gastrojejenostomy for  decompression tomorrow (10/29).  -Decompression of stomach with NG tube -Continue to encourage alcohol cessation -Follow H& H closely through surgery given recent hx of upper GI bleeding, anemia.  All questions were answered to the satisfaction of the patient.   Symone Cornman, Jenna Luo M 04/03/2019, 10:04 AM

## 2019-04-03 NOTE — Consult Note (Signed)
Consult note filed under H&P.  Curlene Labrum, MD [facility] [address] [place], [place] [address] [id number] [phone] (office)

## 2019-04-03 NOTE — Progress Notes (Signed)
#  16 Fr NGT inserted per order to 3rd black notch. Pt tolerated fair, lots of crying and moaning but cooperative with insertion. Ativan given prior to insertion per pt request and per MD order. With insertion, immediate return of milky white fluid from tube. Placement verified also with installation of 40 ml of air w/ + auscultation of air. Tube attached to intermittent low wall suction with total of 250 ml milky white fluid removed. Pt c/o severe pain in abd, throat and nose after insertion. Dilaudid 1mg  IV given per PRN order. Pt now resting quietly, still complains of discomfort in nose and throat, but no resp diff, no SOB, no difficulty swallowing secretions.

## 2019-04-04 ENCOUNTER — Encounter (HOSPITAL_COMMUNITY): Payer: Self-pay | Admitting: *Deleted

## 2019-04-04 ENCOUNTER — Inpatient Hospital Stay (HOSPITAL_COMMUNITY): Payer: Self-pay | Admitting: Anesthesiology

## 2019-04-04 ENCOUNTER — Encounter (HOSPITAL_COMMUNITY)
Admission: EM | Disposition: A | Discharge status: Home / Self Care | Payer: Self-pay | Source: Home / Self Care | Attending: Family Medicine

## 2019-04-04 HISTORY — PX: GASTROJEJUNOSTOMY: SHX1697

## 2019-04-04 LAB — CBC
HCT: 28.1 % — ABNORMAL LOW (ref 39.0–52.0)
Hemoglobin: 8.5 g/dL — ABNORMAL LOW (ref 13.0–17.0)
MCH: 23.3 pg — ABNORMAL LOW (ref 26.0–34.0)
MCHC: 30.2 g/dL (ref 30.0–36.0)
MCV: 77 fL — ABNORMAL LOW (ref 80.0–100.0)
Platelets: 587 10*3/uL — ABNORMAL HIGH (ref 150–400)
RBC: 3.65 MIL/uL — ABNORMAL LOW (ref 4.22–5.81)
RDW: 20.3 % — ABNORMAL HIGH (ref 11.5–15.5)
WBC: 8.9 10*3/uL (ref 4.0–10.5)
nRBC: 0 % (ref 0.0–0.2)

## 2019-04-04 LAB — COMPREHENSIVE METABOLIC PANEL
ALT: 7 U/L (ref 0–44)
AST: 10 U/L — ABNORMAL LOW (ref 15–41)
Albumin: 3.1 g/dL — ABNORMAL LOW (ref 3.5–5.0)
Alkaline Phosphatase: 62 U/L (ref 38–126)
Anion gap: 9 (ref 5–15)
BUN: 9 mg/dL (ref 6–20)
CO2: 27 mmol/L (ref 22–32)
Calcium: 8.9 mg/dL (ref 8.9–10.3)
Chloride: 101 mmol/L (ref 98–111)
Creatinine, Ser: 0.87 mg/dL (ref 0.61–1.24)
GFR calc Af Amer: >60 mL/min (ref >60)
GFR calc non Af Amer: >60 mL/min (ref >60)
Glucose, Bld: 133 mg/dL — ABNORMAL HIGH (ref 70–99)
Potassium: 3.7 mmol/L (ref 3.5–5.1)
Sodium: 137 mmol/L (ref 135–145)
Total Bilirubin: 0.3 mg/dL (ref 0.3–1.2)
Total Protein: 6.6 g/dL (ref 6.5–8.1)

## 2019-04-04 LAB — PREPARE RBC (CROSSMATCH)

## 2019-04-04 SURGERY — GASTROJEJUNOSTOMY
Anesthesia: General | Site: Abdomen

## 2019-04-04 MED ORDER — MIDAZOLAM HCL 2 MG/2ML IJ SOLN
INTRAMUSCULAR | Status: AC
  Filled 2019-04-04: qty 2

## 2019-04-04 MED ORDER — GUAIFENESIN-DM 100-10 MG/5ML PO SYRP
5.0000 mL | ORAL_SOLUTION | ORAL | Status: DC | PRN
  Filled 2019-04-04: qty 5

## 2019-04-04 MED ORDER — LORAZEPAM 2 MG/ML IJ SOLN
1.0000 mg | Freq: Once | INTRAMUSCULAR | Status: AC
  Administered 2019-04-04: 1 mg via INTRAVENOUS
  Filled 2019-04-04: qty 1

## 2019-04-04 MED ORDER — SODIUM CHLORIDE 0.9 % IR SOLN
Status: DC | PRN
  Administered 2019-04-04: 2000 mL

## 2019-04-04 MED ORDER — SUCCINYLCHOLINE CHLORIDE 20 MG/ML IJ SOLN
INTRAMUSCULAR | Status: DC | PRN
  Administered 2019-04-04: 140 mg via INTRAVENOUS

## 2019-04-04 MED ORDER — CEFAZOLIN SODIUM-DEXTROSE 2-4 GM/100ML-% IV SOLN
INTRAVENOUS | Status: AC
  Filled 2019-04-04: qty 100

## 2019-04-04 MED ORDER — SALINE SPRAY 0.65 % NA SOLN
1.0000 | NASAL | Status: DC | PRN
  Filled 2019-04-04: qty 44

## 2019-04-04 MED ORDER — SODIUM CHLORIDE 0.9% IV SOLUTION: Freq: Once | INTRAVENOUS | Status: DC

## 2019-04-04 MED ORDER — SUGAMMADEX SODIUM 500 MG/5ML IV SOLN
INTRAVENOUS | Status: DC | PRN
  Administered 2019-04-04: 200 mg via INTRAVENOUS

## 2019-04-04 MED ORDER — SODIUM CHLORIDE (PF) 0.9 % IJ SOLN
INTRAMUSCULAR | Status: AC
  Filled 2019-04-04: qty 10

## 2019-04-04 MED ORDER — SUFENTANIL CITRATE 50 MCG/ML IV SOLN
INTRAVENOUS | Status: DC | PRN
  Administered 2019-04-04 (×4): 10 ug via INTRAVENOUS

## 2019-04-04 MED ORDER — BUPIVACAINE LIPOSOME 1.3 % IJ SUSP
INTRAMUSCULAR | Status: DC | PRN
  Administered 2019-04-04: 20 mL

## 2019-04-04 MED ORDER — ONDANSETRON HCL 4 MG/2ML IJ SOLN: 4.0000 mg | Freq: Once | INTRAMUSCULAR | Status: DC | PRN

## 2019-04-04 MED ORDER — ROCURONIUM BROMIDE 100 MG/10ML IV SOLN
INTRAVENOUS | Status: DC | PRN
  Administered 2019-04-04: 30 mg via INTRAVENOUS
  Administered 2019-04-04: 20 mg via INTRAVENOUS

## 2019-04-04 MED ORDER — HYDROMORPHONE HCL 1 MG/ML IJ SOLN
0.2500 mg | INTRAMUSCULAR | Status: DC | PRN
  Administered 2019-04-04 (×2): 0.5 mg via INTRAVENOUS
  Filled 2019-04-04 (×3): qty 0.5

## 2019-04-04 MED ORDER — BUPIVACAINE LIPOSOME 1.3 % IJ SUSP
INTRAMUSCULAR | Status: AC
  Filled 2019-04-04: qty 20

## 2019-04-04 MED ORDER — FENTANYL CITRATE (PF) 250 MCG/5ML IJ SOLN
INTRAMUSCULAR | Status: AC
  Filled 2019-04-04: qty 5

## 2019-04-04 MED ORDER — PROPOFOL 10 MG/ML IV BOLUS
INTRAVENOUS | Status: AC
  Filled 2019-04-04: qty 20

## 2019-04-04 MED ORDER — HYDROMORPHONE HCL 1 MG/ML IJ SOLN
1.0000 mg | Freq: Once | INTRAMUSCULAR | Status: AC
  Administered 2019-04-04: 1 mg via INTRAVENOUS
  Filled 2019-04-04: qty 1

## 2019-04-04 MED ORDER — PROPOFOL 10 MG/ML IV BOLUS
INTRAVENOUS | Status: DC | PRN
  Administered 2019-04-04 (×2): 20 mg via INTRAVENOUS
  Administered 2019-04-04: 200 mg via INTRAVENOUS
  Administered 2019-04-04: 20 mg via INTRAVENOUS

## 2019-04-04 MED ORDER — MEPERIDINE HCL 50 MG/ML IJ SOLN: 6.2500 mg | INTRAMUSCULAR | Status: DC | PRN

## 2019-04-04 MED ORDER — FENTANYL CITRATE (PF) 100 MCG/2ML IJ SOLN
INTRAMUSCULAR | Status: DC | PRN
  Administered 2019-04-04: 50 ug via INTRAVENOUS
  Administered 2019-04-04: 100 ug via INTRAVENOUS
  Administered 2019-04-04 (×2): 50 ug via INTRAVENOUS

## 2019-04-04 MED ORDER — SUFENTANIL CITRATE 50 MCG/ML IV SOLN
INTRAVENOUS | Status: AC
  Filled 2019-04-04: qty 1

## 2019-04-04 MED ORDER — LORATADINE 10 MG PO TABS
10.0000 mg | ORAL_TABLET | Freq: Every day | ORAL | Status: DC | PRN
  Filled 2019-04-04: qty 1

## 2019-04-04 SURGICAL SUPPLY — 44 items
CELLS DAT CNTRL 66122 CELL SVR (MISCELLANEOUS) ×1 IMPLANT
CHLORAPREP W/TINT 26 (MISCELLANEOUS) ×2 IMPLANT
CLOTH BEACON ORANGE TIMEOUT ST (SAFETY) ×2 IMPLANT
COVER LIGHT HANDLE STERIS (MISCELLANEOUS) ×4 IMPLANT
COVER WAND RF STERILE (DRAPES) ×1 IMPLANT
DRAPE WARM FLUID 44X44 (DRAPES) ×2 IMPLANT
DRSG OPSITE POSTOP 4X10 (GAUZE/BANDAGES/DRESSINGS) ×2 IMPLANT
ELECT REM PT RETURN 9FT ADLT (ELECTROSURGICAL) ×2
ELECTRODE REM PT RTRN 9FT ADLT (ELECTROSURGICAL) ×1 IMPLANT
GLOVE BIO SURGEON STRL SZ 6.5 (GLOVE) ×2 IMPLANT
GLOVE BIOGEL PI IND STRL 6.5 (GLOVE) ×1 IMPLANT
GLOVE BIOGEL PI IND STRL 7.0 (GLOVE) ×1 IMPLANT
GLOVE BIOGEL PI INDICATOR 6.5 (GLOVE) ×1
GLOVE BIOGEL PI INDICATOR 7.0 (GLOVE) ×3
GLOVE ECLIPSE 6.5 STRL STRAW (GLOVE) ×2 IMPLANT
GLOVE SURG SS PI 7.5 STRL IVOR (GLOVE) ×6 IMPLANT
GOWN STRL REUS W/TWL LRG LVL3 (GOWN DISPOSABLE) ×6 IMPLANT
HANDLE SUCTION POOLE (INSTRUMENTS) ×1 IMPLANT
INST SET MAJOR GENERAL (KITS) ×2 IMPLANT
KIT TURNOVER KIT A (KITS) ×2 IMPLANT
LIGASURE IMPACT 36 18CM CVD LR (INSTRUMENTS) ×2 IMPLANT
MANIFOLD NEPTUNE II (INSTRUMENTS) ×2 IMPLANT
NDL 22X1.5 STRL (OR ONLY) (MISCELLANEOUS) ×1 IMPLANT
NDL HYPO 25X1 1.5 SAFETY (NEEDLE) IMPLANT
NEEDLE 22X1 1/2 OR ONLY (MISCELLANEOUS) ×1
NEEDLE 22X1.5 STRL (OR ONLY) (MISCELLANEOUS) ×1 IMPLANT
NEEDLE HYPO 25X1 1.5 SAFETY (NEEDLE) ×2 IMPLANT
NS IRRIG 1000ML POUR BTL (IV SOLUTION) ×4 IMPLANT
PACK ABDOMINAL MAJOR (CUSTOM PROCEDURE TRAY) ×2 IMPLANT
PAD ARMBOARD 7.5X6 YLW CONV (MISCELLANEOUS) ×2 IMPLANT
RELOAD PROXIMATE 75MM BLUE (ENDOMECHANICALS) ×4 IMPLANT
RELOAD STAPLE 75 3.8 BLU REG (ENDOMECHANICALS) ×2 IMPLANT
RETRACTOR WND ALEXIS 18 MED (MISCELLANEOUS) IMPLANT
RTRCTR WOUND ALEXIS 18CM MED (MISCELLANEOUS) ×2
SET BASIN LINEN APH (SET/KITS/TRAYS/PACK) ×2 IMPLANT
SPONGE LAP 18X18 RF (DISPOSABLE) IMPLANT
STAPLER GUN LINEAR PROX 60 (STAPLE) ×2 IMPLANT
STAPLER PROXIMATE 100MM BLUE (MISCELLANEOUS) ×1 IMPLANT
STAPLER VISISTAT (STAPLE) ×1 IMPLANT
SUCTION POOLE HANDLE (INSTRUMENTS) ×2
SUT PDS AB CT VIOLET #0 27IN (SUTURE) ×4 IMPLANT
SUT SILK 3 0 SH CR/8 (SUTURE) ×3 IMPLANT
SYR 30ML LL (SYRINGE) ×2 IMPLANT
TRAY FOLEY MTR SLVR 16FR STAT (SET/KITS/TRAYS/PACK) ×1 IMPLANT

## 2019-04-04 NOTE — Progress Notes (Signed)
[facility]  Attempted to call girlfriend in patient's room and no answer. NPO and NG in place for tonight. PRN for pain. Protonix BID.   Curlene Labrum, MD [facility] [address] [place], [place] [address] [id number] [phone] (office)

## 2019-04-04 NOTE — Interval H&P Note (Signed)
History and Physical Interval Note:  04/04/2019 12:31 PM  David Miranda  has presented today for surgery, with the diagnosis of gastric outlet obstruction, pancreatitis.  The various methods of treatment have been discussed with the patient and family. After consideration of risks, benefits and other options for treatment, the patient has consented to  Procedure(s): GASTROJEJUNOSTOMY (N/A) as a surgical intervention.  The patient's history has been reviewed, patient examined, no change in status, stable for surgery.  I have reviewed the patient's chart and labs.  Questions were answered to the patient's satisfaction.    No questions.  Virl Cagey

## 2019-04-04 NOTE — Op Note (Signed)
[facility] Operative Note  04/04/19  Preoperative Diagnosis: Gastric outlet obstruction   Postoperative Diagnosis: Same   Procedure(s) Performed: Gastrojejunostomy (loop)    Surgeon: Lanell Matar. Constance Haw, MD   Assistants: Aviva Signs, MD     Anesthesia: General endotracheal   Anesthesiologist: Dr. Wyatt Haste, MD    Specimens:  None    Estimated Blood Loss: Minimal   Blood Replacement: None    Complications: None   Wound Class: Clean contaminated    Operative Indications: David Miranda is a 33 yo with chronic pancreatitis and pseudocyst who has a gastric outlet obstruction on UGI an also noted on aborted EGD after significant liquid contents were evacuated. We discussed performing a gastrojejunostomy to decompress his stomach so that his inflammation from the pancreatitis can improve with plans to repeat an EGD at a later date. We dicussed the risk of bleeding, infection, anastomotic leak, ulcer, and he opted to proceed.   Findings: Normal anatomy    Procedure: The patient was taken to the operating room and placed supine. General endotracheal anesthesia was induced. Intravenous antibiotics were  administered per protocol.  An nasogastric tube was previously positioned to decompress the stomach. A foley was placed. The abdomen was prepared and draped in the usual sterile fashion.   An upper midline incision was made and carried down through the fascia with care. The peritoneal cavity was entered with scissors. The stomach was only slightly distended.  The ligament of Treitz was identified and a mobile loop of jejunum 30-40cm distal to the ligament of Treitz was passed over the colon. The posterior wall of the stomach was exposed by ligating the omentum off the greater curvature with a Ligasure device.  The loop was brought up to the posterior stomach at the greater curvature. Two stay sutures was placed with 3-0 silk suture. The gastrotomy and jejunotomy were made and a  100 mm linear cutting stapler was inserted, closed and fired ensuring that the NG was pulled back while we were manipulating the stapler. The staple line was hemostatic.  The common enterotomy was closed with a TA 60 mm linear cutting stapler. The afferent and efferent limbs were patent.  The staple line was oversewn with 3-0 silk sutures in the Lembert fashion. The crotch of the staple line was protected with a 3-0 silk suture. The NG was confirmed to be in good position.  Omentum was placed over the gastrojejunostomy.  The skin was closed with 0 PDS suture and the skin was closed with staples.   Dr. Arnoldo Morale was assisting throughout the procedure and was present for the critical portions of the case.   Final inspection revealed acceptable hemostasis. All counts were correct at the end of the case. The patient was awakened from anesthesia and extubated without complication.  The patient went to the PACU in stable condition.   David Labrum, MD [facility] [address] [place],  [address] [phone] (office)

## 2019-04-04 NOTE — TOC Progression Note (Signed)
Transition of Care [facility]) - Progression Note    Patient Details  Name: David Miranda MRN: [id number] Date of Birth: 04/12/1986  [facility] CM/SW Contact  Ihor Gully, LCSW Phone Number: 04/04/2019, 5:03 PM  Clinical Narrative:    Patient provided multiple SA resources including both inpatient and outpatient resources. Spouse at bedside. Patient indicates that he will not be drinking anymore.    Expected Discharge Plan: Home/Self Care Barriers to Discharge: Continued Medical Work up  Expected Discharge Plan and Services Expected Discharge Plan: Home/Self Care                                               Social Determinants of Health (SDOH) Interventions    Readmission Risk Interventions No flowsheet data found.

## 2019-04-04 NOTE — Anesthesia Preprocedure Evaluation (Signed)
Anesthesia Evaluation  Patient identified by MRN, date of birth, ID band Patient awake    Reviewed: Allergy & Precautions, NPO status , Patient's Chart, lab work & pertinent test results  Airway Mallampati: III  TM Distance: >3 FB Neck ROM: Full    Dental  (+) Dental Advisory Given, Missing   Pulmonary Current Smoker and Patient abstained from smoking.,           Cardiovascular Exercise Tolerance: Good hypertension,      Neuro/Psych PSYCHIATRIC DISORDERS negative neurological ROS     GI/Hepatic GERD  Medicated and Poorly Controlled,(+)     substance abuse  alcohol use, cocaine use and marijuana use,   Endo/Other  negative endocrine ROS  Renal/GU Renal InsufficiencyRenal disease     Musculoskeletal   Abdominal   Peds  Hematology  (+) anemia ,   Anesthesia Other Findings Active Problems:   Acute alcoholic pancreatitis   Alcohol abuse   Tobacco abuse   Cannabis abuse   Esophagitis   Pancreatic pseudocyst/cyst--alcoholic pancreatitis   GI bleed   Abdominal pain   Gastric outlet obstruction   Reproductive/Obstetrics                             Anesthesia Physical Anesthesia Plan  ASA: IV  Anesthesia Plan: General   Post-op Pain Management:    Induction: Intravenous  PONV Risk Score and Plan: Ondansetron and Dexamethasone  Airway Management Planned: Oral ETT  Additional Equipment:   Intra-op Plan:   Post-operative Plan:   Informed Consent: I have reviewed the patients History and Physical, chart, labs and discussed the procedure including the risks, benefits and alternatives for the proposed anesthesia with the patient or authorized representative who has indicated his/her understanding and acceptance.     Dental advisory given  Plan Discussed with: CRNA  Anesthesia Plan Comments:         Anesthesia Quick Evaluation

## 2019-04-04 NOTE — Anesthesia Postprocedure Evaluation (Signed)
Anesthesia Post Note  Patient: David Miranda  Procedure(s) Performed: GASTROJEJUNOSTOMY (N/A Abdomen)  Patient location during evaluation: PACU Anesthesia Type: General Level of consciousness: awake and alert, oriented and patient cooperative Pain management: pain level controlled Vital Signs Assessment: post-procedure vital signs reviewed and stable Respiratory status: spontaneous breathing Cardiovascular status: stable Postop Assessment: no apparent nausea or vomiting     Last Vitals:  Vitals:   04/04/19 1541 04/04/19 1545  BP: (!) 124/91   Pulse: 85 85  Resp: (P) 20 (!) (P) 24  Temp:  (P) 36.7 C  SpO2: 96% 95%    Last Pain:  Vitals:   04/04/19 1500  TempSrc:   PainSc: Asleep                 Yasaman Kolek A

## 2019-04-04 NOTE — Progress Notes (Signed)
Patient Demographics:    David Miranda, is a 33 y.o. male, DOB - 01-Jan-1986, [id number]  Admit date - 03/30/2019   Admitting Physician Thayden Lemire Mariea Clonts, MD  Outpatient Primary MD for the patient is Patient, No Pcp Per  LOS - 4   Chief Complaint  Patient presents with   Abdominal Pain        Subjective:    David Miranda today has no fevers,   No chest pain,    - -Uncomfortable with postop pain, additional Dilaudid and lorazepam given  Assessment  & Plan :    Principal Problem:   Emesis, persistent Active Problems:   Acute alcoholic pancreatitis   Alcohol abuse   Tobacco abuse   Cannabis abuse   Esophagitis   Pancreatic pseudocyst/cyst--alcoholic pancreatitis   GI bleed   Abdominal pain   Gastric outlet obstruction  CT abdomen and pelvis from 03/18/2019 1)Marked distal esophageal thickening and mucosal hyperemia with some adjacent reactive free fluid in the lower mediastinum, consistent with esophagitis. 2. Markedly distended stomach with ingested material. 3. Multiple hypoattenuating cystic foci throughout the pancreaslikely reflect pancreatic pseudocyst/sequela of prior pancreatitis. The largest cystic pancreatic lesion measuring 1.8 cm  Brief summary -33 year old alcoholic male with history of esophagitis and recurrent pancreatitis with pseudocyst as well as chronic anemia admitted on 03/30/2019 with intractable emesis with [employer] looking contents, hemoglobin dropped to 6.2 required transfusion of 1 unit of packed cells on 03/30/2019, EGD from today 27 2020 with esophagitis and gastritis ,upper GI series from 04/02/2019 suggesting obstruction, NG tube placed 04/03/2019, s/p gastrojejunostomy on 04/04/2019  A/p 1)Severe Alcoholic Esophagitis and Gastritis/GOO --continue IV Protonix as ordered, s/p EGD on 04/01/2019 with erosive esophagitis with bleeding, gastric and duodenal  evaluation aborted due to significant amount of retained food in the stomach despite patient being n.p.o -Continue Protonix twice daily, -UGI from 04/02/2019 suggesting obstruction -Surgical consult appreciated -As needed Zofran S/p  gastrojejunostomy for decompression on 04/04/2019, gastic outlet obstruction presumed 2/2 chronic pancreatitis with pseudocyst.  -   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--continue, IV fluids, , as needed Zofran and Dilaudid -Continue Protonix -Elevated lipase but trending down, abdominal pain improving, nausea persist but no further emesis --Once oral intake resumes may resume Creon with meals -Patient is advised to have abdominal imaging surveillance of his pancreatic pseudocyst every 6 months for the next 2 years to confirm stability  3)History of alcohol Abuse--- patient states he has not had a drink for over a week PTA-continue lorazepam per CIWA protocol, folic acid and thiamine as ordered -No evidence of DTs at this time  4)Thrombocytosis--suspect this is reactive, hydrate, -Platelets are trending down   5) acute on chronic anemia--suspect due to acute blood loss in the setting of intractable emesis -Hemoglobin is up to 8.5 from 6.2 after transfusion of 2 unit of PRBC on 03/30/2019.  On admission hemoglobin was 8.0 .  It was 9.2 about 12 days PTA and   previous baseline of 11 -Continue to monitor H&H and transfuse as clinically indicated -Stool occult blood is negative -EGD as noted above with erosive esophagitis and bleeding   Disposition/Need for in-Hospital Stay- patient unable to be discharged at this time due to -requiring IV antiemetics and IV Dilaudid  S/p gastrojejunostomy on 04/04/2019-not tolerating oral intake yet  Code Status : Full code  Family Communication:    (patient is alert, awake and coherent) -Discussed with significant other  Disposition Plan  : -Once tolerating oral intake if okay by  general surgery  Procedure- EGD on 04/01/2019 with erosive esophagitis with bleeding, gastric and duodenal evaluation aborted due to significant amount of retained food in the stomach despite patient being n.p.o.  Procedure:- 04/02/2019-UGI IMPRESSION: Marked distention of the stomach. Small amount of contrast is identified within the duodenal wall but no further passage of contrast into the duodenum or small bowel loops is identified by 2 hours consistent with obstruction. Gastroesophageal reflux.  04/04/2019 Gastrojejunostomy (loop) --by Dr. Henreitta Leber  Consults  :  Gi/general surgery  DVT Prophylaxis  :  SCDs (GI bleed)  Lab Results  Component Value Date   PLT 587 (H) 04/04/2019    Inpatient Medications  Scheduled Meds:  sodium chloride   Intravenous Once   sodium chloride   Intravenous Once    HYDROmorphone (DILAUDID) injection  1 mg Intravenous Once   lidocaine  5 mL Mouth/Throat Once   LORazepam  1 mg Intravenous Once   ondansetron (ZOFRAN) IV  4 mg Intravenous TID WC & HS   pantoprazole (PROTONIX) IV  40 mg Intravenous Q12H   sodium chloride flush  3 mL Intravenous Q12H   thiamine  100 mg Intravenous Daily   traZODone  100 mg Oral QHS   Continuous Infusions:  sodium chloride     dextrose 5 % and 0.45 % NaCl with KCl 20 mEq/L 100 mL/hr at 04/04/19 1638   PRN Meds:.sodium chloride, albuterol, alum & mag hydroxide-simeth, guaiFENesin-dextromethorphan, HYDROmorphone (DILAUDID) injection, loratadine, LORazepam, ondansetron **OR** [DISCONTINUED] ondansetron (ZOFRAN) IV, phenol, sodium chloride, sodium chloride flush   Anti-infectives (From admission, onward)   Start     Dose/Rate Route Frequency Ordered Stop   04/04/19 1300  ceFAZolin (ANCEF) IVPB 2g/100 mL premix     2 g 200 mL/hr over 30 Minutes Intravenous On call to O.R. 04/03/19 2222 04/04/19 1330        Objective:   Vitals:   04/04/19 1530 04/04/19 1541 04/04/19 1545 04/04/19 1611  BP: (!)  126/102 (!) 124/91 (!) 128/92 (!) 126/93  Pulse: (!) 102 85 85 86  Resp:  20 (!) 24 16  Temp:   98 F (36.7 C) 98.4 F (36.9 C)  TempSrc:    Oral  SpO2: 93% 96% 95% 100%  Weight:      Height:        Wt Readings from Last 3 Encounters:  04/04/19 71 kg  03/18/19 77.1 kg  07/29/18 80.1 kg     Intake/Output Summary (Last 24 hours) at 04/04/2019 1658 Last data filed at 04/04/2019 1543 Gross per 24 hour  Intake 1500 ml  Output 1325 ml  Net 175 ml    Physical Exam  Gen:- Awake Alert, uncomfortable post op HEENT:- [place].AT, No sclera icterus Nose- NG tube-to intermittent wall suction Neck-Supple Neck,No JVD,.  Lungs-  CTAB , fair symmetrical air movement CV- S1, S2 normal, regular  Abd-  +ve B.Sounds, Abd Soft, appropriate postop tenderness  extremity/Skin:- No  edema, pedal pulses present  Psych-affect is appropriate, oriented x3 Neuro-no new focal deficits, no tremors   Data Review:   Micro Results Recent Results (from the past 240 hour(s))  SARS CORONAVIRUS 2 (TAT 6-24 HRS) Nasopharyngeal Nasopharyngeal Swab     Status: None   Collection Time: 03/30/19  9:24  AM   Specimen: Nasopharyngeal Swab  Result Value Ref Range Status   SARS Coronavirus 2 NEGATIVE NEGATIVE Final    Comment: (NOTE) SARS-CoV-2 target nucleic acids are NOT DETECTED. The SARS-CoV-2 RNA is generally detectable in upper and lower respiratory specimens during the acute phase of infection. Negative results do not preclude SARS-CoV-2 infection, do not rule out co-infections with other pathogens, and should not be used as the sole basis for treatment or other patient management decisions. Negative results must be combined with clinical observations, patient history, and epidemiological information. The expected result is Negative. Fact Sheet for Patients: HairSlick.no Fact Sheet for Healthcare Providers: quierodirigir.com This test is not yet  approved or cleared by the [place] FDA and  has been authorized for detection and/or diagnosis of SARS-CoV-2 by FDA under an Emergency Use Authorization (EUA). This EUA will remain  in effect (meaning this test can be used) for the duration of the COVID-19 declaration under Section 56 4(b)(1) of the Act, 21 U.S.C. section 360bbb-3(b)(1), unless the authorization is terminated or revoked sooner. Performed at [facility], [address] [place], [place] [address]     Radiology Reports Dg Abd 1 View  Result Date: 04/02/2019 CLINICAL DATA:  Abdominal pain following upper GI EXAM: ABDOMEN - 1 VIEW COMPARISON:  None. FINDINGS: Scattered large and small bowel gas is noted. Contrast material remains within the stomach from the recent upper GI. Some contrast has passed into the more distal small bowel. No contrast is noted within the colon. No obstructive changes are seen. IMPRESSION: Contrast material within the more distal small bowel. No obstructive changes are noted. Considerable retained contrast is noted within the stomach Electronically Signed   By: Alcide Clever M.D.   On: 04/02/2019 20:10   Ct Abdomen Pelvis W Contrast  Result Date: 03/18/2019 CLINICAL DATA:  Emesis and vomiting for 4 days EXAM: CT ABDOMEN AND PELVIS WITH CONTRAST TECHNIQUE: Multidetector CT imaging of the abdomen and pelvis was performed using the standard protocol following bolus administration of intravenous contrast. CONTRAST:  OMNIPAQUE IOHEXOL 300 MG/ML  SOLN COMPARISON:  CT 07/26/2018 FINDINGS: Lower chest: Dependent atelectasis. Lung bases are otherwise clear. Normal heart size. No pericardial effusion. Hepatobiliary: No focal liver abnormality is seen. No gallstones, gallbladder wall thickening, or biliary dilatation. 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 Largest fluid attenuation cystic lesion  measures up to 1.8 cm at the pancreatic head neck junction. Note definitive connection to the main pancreatic duct. No pancreatic ductal dilatation or peripancreatic inflammation is present at this time. Spleen: Normal in size without focal abnormality. Adrenals/Urinary Tract: Normal adrenal glands. Few fluid attenuation cysts in the kidneys are unchanged from prior. No concerning renal lesions. No hydronephrosis or urolithiasis. Normal urinary bladder. Stomach/Bowel: Marked distal esophageal thickening and mucosal hyperemia with some adjacent reactive free fluid in the lower mediastinum. The stomach is markedly distended with ingested material. Duodenal sweep takes a normal anatomic course. No small bowel dilatation or wall thickening. A normal appendix is visualized. No colonic dilatation or wall thickening. Vascular/Lymphatic: The aorta is normal caliber. Few reactive upper abdominal lymph nodes. No pathologically enlarged abdominopelvic nodes. Reproductive: The prostate and seminal vesicles are unremarkable. Other: No abdominopelvic free fluid or free gas. No bowel containing hernias. Musculoskeletal: No acute osseous abnormality or suspicious osseous lesion. IMPRESSION: 1. Marked distal esophageal thickening and mucosal hyperemia with some adjacent reactive free fluid in the lower mediastinum, consistent with esophagitis.  2. Markedly distended stomach with ingested material. 3. 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 No pancreatic ductal dilatation or peripancreatic inflammation at this time. The largest cystic pancreatic lesion measuring 1.8 cm assess detailed six-month follow-up imaging for at least 2 years of stability. This recommendation follows ACR consensus guidelines: Management of Incidental Pancreatic Cysts: A White Paper of the ACR Incidental Findings Committee. J Am Coll Radiol 2017;14:911-923. Electronically  Signed   By: Kreg Shropshire M.D.   On: 03/18/2019 06:13   Dg Abdomen Acute W/chest  Result Date: 03/30/2019 CLINICAL DATA:  Abdominal pain and nausea EXAM: DG ABDOMEN ACUTE W/ 1V CHEST COMPARISON:  Abdomen series March 26, 2018 FINDINGS: PA chest: No edema or consolidation. Heart size and pulmonary vascularity are normal. No adenopathy. Supine and upright abdomen: There is moderate stool in the colon. There is no bowel dilatation or air-fluid level to suggest bowel obstruction. No free air. No abnormal calcifications. IMPRESSION: Moderate stool in colon. No bowel obstruction or free air evident. Lungs clear. Electronically Signed   By: Bretta Bang III M.D.   On: 03/30/2019 08:26   Dg Kayleen Memos W Double Cm (hd Ba)  Result Date: 04/02/2019 CLINICAL DATA:  Severe nausea and vomiting, gastric distention, retained food debris on endoscopy, question gastric outlet obstruction; history of pancreatitis, GERD, smoking, alcohol abuse EXAM: UPPER GI SERIES WITH KUB TECHNIQUE: After obtaining a scout radiograph a routine upper GI series was performed using thin and high density barium. FLUOROSCOPY TIME:  Fluoroscopy Time:  0 minutes 42 seconds Radiation Exposure Index (if provided by the fluoroscopic device): 44.8 mGy Number of Acquired Spot Images: 6 plus multiple fluoroscopic screen captures COMPARISON:  03/30/2019 FINDINGS: Marked gastric distention on scout image. Stool throughout colon. Nonobstructive bowel gas pattern. Esophagus normal appearance, demonstrate normal distention and motility. No esophageal narrowing or mucosal/wall irregularity. Marked gastric distension. Some retained food debris. Many of the rugal folds appear effaced due to degree of gastric distention. Observed rugal folds normal appearance. No definite mass or ulceration. Several images demonstrate apparent contrast within the duodenal bulb though no passage of contrast beyond this site is identified on a 2 hour delayed image.  Gastroesophageal reflux noted. IMPRESSION: Marked distention of the stomach. Small amount of contrast is identified within the duodenal wall but no further passage of contrast into the duodenum or small bowel loops is identified by 2 hours consistent with obstruction. Gastroesophageal reflux. Electronically Signed   By: Ulyses Southward M.D.   On: 04/02/2019 12:31    CBC Recent Labs  Lab 03/30/19 0737  03/31/19 7628 03/31/19 1538 04/01/19 0621 04/02/19 0723 04/04/19 0618  WBC 10.0   < > 8.0 6.3 11.2* 13.8* 8.9  HGB 8.0*   < > 7.0* 6.8* 9.0* 9.6* 8.5*  HCT 26.3*   < > 23.5* 22.7* 29.2* 31.2* 28.1*  PLT 1,152*   < > 761* 645* 776* 710* 587*  MCV 72.9*   < > 75.8* 75.2* 77.7* 77.0* 77.0*  MCH 22.2*   < > 22.6* 22.5* 23.9* 23.7* 23.3*  MCHC 30.4   < > 29.8* 30.0 30.8 30.8 30.2  RDW 17.4*   < > 18.3* 18.3* 19.9* 19.9* 20.3*  LYMPHSABS 1.3  --   --   --   --   --   --   MONOABS 0.9  --   --   --   --   --   --   EOSABS 0.1  --   --   --   --   --   --  BASOSABS 0.0  --   --   --   --   --   --    < > = values in this interval not displayed.    Chemistries  Recent Labs  Lab 03/30/19 0737 03/31/19 0613 04/02/19 0723 04/04/19 0618  NA 139 137 138 137  K 2.7* 3.7 3.8 3.7  CL 88* 104 100 101  CO2 33* 26 26 27   GLUCOSE 195* 107* 129* 133*  BUN 16 5* 9 9  CREATININE 1.07 0.71 0.87 0.87  CALCIUM 10.1 8.7* 9.5 8.9  MG 2.0  --   --   --   AST 20  --   --  10*  ALT 12  --   --  7  ALKPHOS 84  --   --  62  BILITOT 0.5  --   --  0.3   ------------------------------------------------------------------------------------------------------------------ No results for input(s): CHOL, HDL, LDLCALC, TRIG, CHOLHDL, LDLDIRECT in the last 72 hours.  Lab Results  Component Value Date   HGBA1C 5.2 12/27/2017   ------------------------------------------------------------------------------------------------------------------ No results for input(s): TSH, T4TOTAL, T3FREE, THYROIDAB in the last 72  hours.  Invalid input(s): FREET3 ------------------------------------------------------------------------------------------------------------------ No results for input(s): VITAMINB12, FOLATE, FERRITIN, TIBC, IRON, RETICCTPCT in the last 72 hours.  Coagulation profile No results for input(s): INR, PROTIME in the last 168 hours.  No results for input(s): DDIMER in the last 72 hours.  Cardiac Enzymes No results for input(s): CKMB, TROPONINI, MYOGLOBIN in the last 168 hours.  Invalid input(s): CK ------------------------------------------------------------------------------------------------------------------ No results found for: BNP  Shon Hale M.D on 04/04/2019 at 4:58 PM  Go to [email or web address] - for contact info  [facility] - Office  [phone]

## 2019-04-04 NOTE — Transfer of Care (Signed)
Immediate Anesthesia Transfer of Care Note  Patient: David Miranda  Procedure(s) Performed: GASTROJEJUNOSTOMY (N/A Abdomen)  Patient Location: PACU  Anesthesia Type:General  Level of Consciousness: awake and patient cooperative  Airway & Oxygen Therapy: Patient Spontanous Breathing  Post-op Assessment: Report given to RN and Post -op Vital signs reviewed and stable  Post vital signs: Reviewed and stable  Last Vitals:  Vitals Value Taken Time  BP 127/90 04/04/19 1442  Temp 97.8   Pulse 112 04/04/19 1446  Resp 16 04/04/19 1446  SpO2 99 % 04/04/19 1446  Vitals shown include unvalidated device data.  Last Pain:  Vitals:   04/04/19 1156  TempSrc: Oral  PainSc: 6       Patients Stated Pain Goal: 3 ([id number])  Complications: No apparent anesthesia complications

## 2019-04-04 NOTE — Anesthesia Procedure Notes (Signed)
Procedure Name: Intubation Date/Time: 04/04/2019 1:23 PM Performed by: Vista Deck, CRNA Pre-anesthesia Checklist: Patient identified, Patient being monitored, Timeout performed, Emergency Drugs available and Suction available Patient Re-evaluated:Patient Re-evaluated prior to induction Oxygen Delivery Method: Circle system utilized Preoxygenation: Pre-oxygenation with 100% oxygen Induction Type: IV induction Ventilation: Mask ventilation without difficulty Laryngoscope Size: Mac and 4 Grade View: Grade II Tube type: Oral Tube size: 7.0 mm Number of attempts: 1 Airway Equipment and Method: Stylet and Oral airway Placement Confirmation: ETT inserted through vocal cords under direct vision,  positive ETCO2 and breath sounds checked- equal and bilateral Secured at: 23 cm Tube secured with: Tape Dental Injury: Teeth and Oropharynx as per pre-operative assessment

## 2019-04-04 NOTE — Progress Notes (Signed)
From pacu around 1600.  Midline abd honeycomb dressing in place with  dime size spot of drainage.  Abd binder in place. NG tube in place to right nare to suction and draining light pink.   Vitals stable.  Drowsy but c/o pain.  Received 1mg  dilaudid at 1530 in pacu.  Still c/o pain rated an 8 since returning from pacu Contacted Dr. Joesph Fillers and he ordered one time dose of dilaudid and ativan. Girlfriend at bedside.

## 2019-04-05 LAB — CBC
HCT: 28.7 % — ABNORMAL LOW (ref 39.0–52.0)
Hemoglobin: 8.6 g/dL — ABNORMAL LOW (ref 13.0–17.0)
MCH: 22.9 pg — ABNORMAL LOW (ref 26.0–34.0)
MCHC: 30 g/dL (ref 30.0–36.0)
MCV: 76.3 fL — ABNORMAL LOW (ref 80.0–100.0)
Platelets: 513 10*3/uL — ABNORMAL HIGH (ref 150–400)
RBC: 3.76 MIL/uL — ABNORMAL LOW (ref 4.22–5.81)
RDW: 21.1 % — ABNORMAL HIGH (ref 11.5–15.5)
WBC: 10.4 10*3/uL (ref 4.0–10.5)
nRBC: 0 % (ref 0.0–0.2)

## 2019-04-05 LAB — BASIC METABOLIC PANEL
Anion gap: 8 (ref 5–15)
BUN: 6 mg/dL (ref 6–20)
CO2: 25 mmol/L (ref 22–32)
Calcium: 8.8 mg/dL — ABNORMAL LOW (ref 8.9–10.3)
Chloride: 102 mmol/L (ref 98–111)
Creatinine, Ser: 0.83 mg/dL (ref 0.61–1.24)
GFR calc Af Amer: >60 mL/min (ref >60)
GFR calc non Af Amer: >60 mL/min (ref >60)
Glucose, Bld: 131 mg/dL — ABNORMAL HIGH (ref 70–99)
Potassium: 3.7 mmol/L (ref 3.5–5.1)
Sodium: 135 mmol/L (ref 135–145)

## 2019-04-05 MED ORDER — BISACODYL 10 MG RE SUPP: 10.0000 mg | Freq: Every day | RECTAL | Status: DC | PRN

## 2019-04-05 MED ORDER — NICOTINE 21 MG/24HR TD PT24
21.0000 mg | MEDICATED_PATCH | Freq: Every day | TRANSDERMAL | Status: DC
  Administered 2019-04-05 – 2019-04-07 (×3): 21 mg via TRANSDERMAL
  Filled 2019-04-05 (×4): qty 1

## 2019-04-05 MED ORDER — OXYCODONE HCL 5 MG/5ML PO SOLN
10.0000 mg | ORAL | Status: DC | PRN
  Administered 2019-04-05 – 2019-04-07 (×8): 10 mg via ORAL
  Filled 2019-04-05 (×8): qty 10

## 2019-04-05 MED ORDER — METHOCARBAMOL 1000 MG/10ML IJ SOLN
500.0000 mg | Freq: Three times a day (TID) | INTRAVENOUS | Status: DC | PRN
  Administered 2019-04-05 – 2019-04-06 (×2): 500 mg via INTRAVENOUS
  Filled 2019-04-05 (×3): qty 5

## 2019-04-05 MED ORDER — ACETAMINOPHEN 160 MG/5ML PO SOLN
1000.0000 mg | Freq: Four times a day (QID) | ORAL | Status: DC
  Administered 2019-04-05 – 2019-04-07 (×10): 1000 mg via ORAL
  Filled 2019-04-05 (×10): qty 40.6

## 2019-04-05 NOTE — Progress Notes (Signed)
[facility] Progress Note  1 Day Post-Op  Subjective: Having pain. Has not been out of bed. Has not had BM. NG with 900 in the canister from today's shift.   Objective: Vital signs in last 24 hours: Temp:  [97.8 F (36.6 C)-98.4 F (36.9 C)] 98.4 F (36.9 C) (10/31 0900) Pulse Rate:  [85-117] 89 (10/31 0900) Resp:  [11-24] 18 (10/31 0609) BP: (109-137)/(76-102) 130/88 (10/31 0900) SpO2:  [91 %-100 %] 100 % (10/31 0900) Last BM Date: 04/02/19  Intake/Output from previous day: 10/30 0701 - 10/31 0700 In: 2100 [I.V.:2100] Out: 3325 [Urine:1600; Emesis/NG output:1300; Blood:25] Intake/Output this shift: No intake/output data recorded.  General appearance: alert, cooperative and no distress Resp: normal work of breathing GI: soft, nondistended, appropriately tender, honeycomb c/d/i  Lab Results:  Recent Labs    04/04/19 0618 04/05/19 0517  WBC 8.9 10.4  HGB 8.5* 8.6*  HCT 28.1* 28.7*  PLT 587* 513*   BMET Recent Labs    04/04/19 0618 04/05/19 0517  NA 137 135  K 3.7 3.7  CL 101 102  CO2 27 25  GLUCOSE 133* 131*  BUN 9 6  CREATININE 0.87 0.83  CALCIUM 8.9 8.8*     Assessment/Plan: David Miranda is a 33 yo s/p GASTROJEJUNOSTOMY due to gastric outlet obstruction. Doing fair. Added scheduled liquid tylenol and PRN roxicodone liquid for pain Encouraged OOB and ambulating NPO, NG to remain in place due to output   No BM in days, will give option of suppository if he wants to try SCDs, has been of prophylaxis due to GIB, will see what H&H do tomorrow probably should start lovenox if remains stable    LOS: 5 days    Virl Cagey 04/05/2019

## 2019-04-05 NOTE — Progress Notes (Signed)
Subjective: Appears slightly drowsy this morning but denies any significant vomiting but does describe abdominal pain.  Patient is receiving Dilaudid intravenously as required every 3 hours.   Objective: Vital signs in last 24 hours: Temp:  [97.8 F (36.6 C)-98.4 F (36.9 C)] 98.4 F (36.9 C) (10/31 0900) Pulse Rate:  [68-117] 89 (10/31 0900) Resp:  [11-24] 18 (10/31 0609) BP: (109-137)/(76-102) 130/88 (10/31 0900) SpO2:  [91 %-100 %] 100 % (10/31 0900) Weight:  [71 kg] 71 kg (10/30 1156) Hemodynamically stable.  Afebrile.  Some abdominal tenderness on light palpation.  Answers questions appropriately.  Does not appear to be in alcohol withdrawal at the present time Intake/Output from previous day: 10/30 0701 - 10/31 0700 In: 2100 [I.V.:2100] Out: 3325 [Urine:1600; Emesis/NG output:1300; Blood:25] Intake/Output this shift: No intake/output data recorded.  Recent Labs    04/04/19 0618 04/05/19 0517  HGB 8.5* 8.6*   Recent Labs    04/04/19 0618 04/05/19 0517  WBC 8.9 10.4  RBC 3.65* 3.76*  HCT 28.1* 28.7*  PLT 587* 513*   Recent Labs    04/04/19 0618 04/05/19 0517  NA 137 135  K 3.7 3.7  CL 101 102  CO2 27 25  BUN 9 6  CREATININE 0.87 0.83  GLUCOSE 133* 131*  CALCIUM 8.9 8.8*     Assessment/Plan: 1.  Severe alcoholic esophagitis and gastric outlet obstruction, status post EGD and decompression yesterday by surgery. 2.  Acute on chronic alcoholic pancreatitis and history of alcohol abuse.  Stable/improving.  No evidence of DTs/alcohol withdrawal at the present time. 3.  Thrombocytosis likely reactive, improving.  Repeat CBC tomorrow morning and also check for anemia, status post 2 units blood transfusion so far.   Farren Landa C Samuele Storey 04/05/2019, 11:35 AM

## 2019-04-06 LAB — COMPREHENSIVE METABOLIC PANEL
ALT: 8 U/L (ref 0–44)
AST: 11 U/L — ABNORMAL LOW (ref 15–41)
Albumin: 2.9 g/dL — ABNORMAL LOW (ref 3.5–5.0)
Alkaline Phosphatase: 59 U/L (ref 38–126)
Anion gap: 10 (ref 5–15)
BUN: 9 mg/dL (ref 6–20)
CO2: 25 mmol/L (ref 22–32)
Calcium: 9.1 mg/dL (ref 8.9–10.3)
Chloride: 101 mmol/L (ref 98–111)
Creatinine, Ser: 0.77 mg/dL (ref 0.61–1.24)
GFR calc Af Amer: >60 mL/min (ref >60)
GFR calc non Af Amer: >60 mL/min (ref >60)
Glucose, Bld: 112 mg/dL — ABNORMAL HIGH (ref 70–99)
Potassium: 3.6 mmol/L (ref 3.5–5.1)
Sodium: 136 mmol/L (ref 135–145)
Total Bilirubin: 0.6 mg/dL (ref 0.3–1.2)
Total Protein: 6.3 g/dL — ABNORMAL LOW (ref 6.5–8.1)

## 2019-04-06 LAB — CBC
HCT: 26.5 % — ABNORMAL LOW (ref 39.0–52.0)
Hemoglobin: 8.2 g/dL — ABNORMAL LOW (ref 13.0–17.0)
MCH: 23.2 pg — ABNORMAL LOW (ref 26.0–34.0)
MCHC: 30.9 g/dL (ref 30.0–36.0)
MCV: 74.9 fL — ABNORMAL LOW (ref 80.0–100.0)
Platelets: 458 10*3/uL — ABNORMAL HIGH (ref 150–400)
RBC: 3.54 MIL/uL — ABNORMAL LOW (ref 4.22–5.81)
RDW: 20.7 % — ABNORMAL HIGH (ref 11.5–15.5)
WBC: 9.6 10*3/uL (ref 4.0–10.5)
nRBC: 0 % (ref 0.0–0.2)

## 2019-04-06 NOTE — Progress Notes (Signed)
Subjective: He is alert this morning but is describing left sided abdominal pain around the left upper quadrant.  He has an NG tube in situ.  Oxycodone via NG tube was added yesterday by Dr. Constance Haw.   Objective: Vital signs in last 24 hours: Temp:  [97.8 F (36.6 C)-98 F (36.7 C)] 98 F (36.7 C) (11/01 0612) Pulse Rate:  [80-98] 80 (11/01 0612) Resp:  [18] 18 (11/01 0612) BP: (122-132)/(76-93) 122/76 (11/01 0612) SpO2:  [96 %-100 %] 96 % (11/01 0612) He is alert and orientated in no clinical evidence of alcohol withdrawal.  His abdomen is soft and tender apparently quite severely at soft touch.  Bowel sounds are very scanty.  Hemodynamically stable. Intake/Output from previous day: 10/31 0701 - 11/01 0700 In: 4572.3 [I.V.:3722.3; NG/GT:800; IV Piggyback:50] Out: [id number] [Emesis/NG output:1625] Intake/Output this shift: No intake/output data recorded.  Recent Labs    04/04/19 0618 04/05/19 0517 04/06/19 0639  HGB 8.5* 8.6* 8.2*   Recent Labs    04/05/19 0517 04/06/19 0639  WBC 10.4 9.6  RBC 3.76* 3.54*  HCT 28.7* 26.5*  PLT 513* 458*   Recent Labs    04/05/19 0517 04/06/19 0639  NA 135 136  K 3.7 3.6  CL 102 101  CO2 25 25  BUN 6 9  CREATININE 0.83 0.77  GLUCOSE 131* 112*  CALCIUM 8.8* 9.1     Assessment/Plan: 1.  Gastric outlet obstruction status post decompression surgery and s/p GASTROJEJUNOSTOMY.  I think is discomfort in his abdomen is likely expected postsurgical changes but I will defer to Dr. Constance Haw when she sees him later on. 2.  Acute on chronic alcoholic pancreatitis and history of alcohol abuse.  There are no signs of alcohol withdrawal today. 3.  Anemia and thrombocytosis.  Hemoglobin is relatively stable and platelet count continues to decrease which is expected.  Continue to follow this.   David Miranda C Patrick Sohm 04/06/2019, 9:51 AM

## 2019-04-06 NOTE — Progress Notes (Signed)
[facility] Progress Note  2 Days Post-Op  Subjective: Less output, and having some flatus. No BM. Has been walking and got dressed this AM. Does not like the NG.   Objective: Vital signs in last 24 hours: Temp:  [97.8 F (36.6 C)-98 F (36.7 C)] 98 F (36.7 C) (11/01 0612) Pulse Rate:  [80-98] 80 (11/01 0612) Resp:  [18] 18 (11/01 0612) BP: (122-132)/(76-93) 122/76 (11/01 0612) SpO2:  [96 %-100 %] 96 % (11/01 0612) Last BM Date: 04/02/19  Intake/Output from previous day: 10/31 0701 - 11/01 0700 In: 4572.3 [I.V.:3722.3; NG/GT:800; IV Piggyback:50] Out: [id number] [Emesis/NG output:1625] Intake/Output this shift: No intake/output data recorded.  General appearance: alert, cooperative and no distress Resp: normal work of breathing GI: abdominal binder in place, soft, nondistended  Lab Results:  Recent Labs    04/05/19 0517 04/06/19 0639  WBC 10.4 9.6  HGB 8.6* 8.2*  HCT 28.7* 26.5*  PLT 513* 458*   BMET Recent Labs    04/05/19 0517 04/06/19 0639  NA 135 136  K 3.7 3.6  CL 102 101  CO2 25 25  GLUCOSE 131* 112*  BUN 6 9  CREATININE 0.83 0.77  CALCIUM 8.8* 9.1     Assessment/Plan: Mr. Rozell is a 33 yo s/p GASTROJEJUNOSTOMY due to gastric outlet obstruction.  Scheduled liquid tylenol and PRN roxicodone liquid for pain Encouraged OOB and ambulating D/c NG, clear diet If has BM can advanced to full liquids  SCDs,. H&H still drifting, would continue to hold lovenox for now    LOS: 6 days    Virl Cagey 04/06/2019

## 2019-04-07 LAB — TYPE AND SCREEN
ABO/RH(D): B NEG
Antibody Screen: NEGATIVE
Unit division: 0
Unit division: 0

## 2019-04-07 LAB — BASIC METABOLIC PANEL
Anion gap: 9 (ref 5–15)
BUN: 8 mg/dL (ref 6–20)
CO2: 26 mmol/L (ref 22–32)
Calcium: 9.2 mg/dL (ref 8.9–10.3)
Chloride: 103 mmol/L (ref 98–111)
Creatinine, Ser: 0.92 mg/dL (ref 0.61–1.24)
GFR calc Af Amer: >60 mL/min (ref >60)
GFR calc non Af Amer: >60 mL/min (ref >60)
Glucose, Bld: 101 mg/dL — ABNORMAL HIGH (ref 70–99)
Potassium: 4.3 mmol/L (ref 3.5–5.1)
Sodium: 138 mmol/L (ref 135–145)

## 2019-04-07 LAB — BPAM RBC
Blood Product Expiration Date: 202012032359
Blood Product Expiration Date: 202012052359
Unit Type and Rh: 1700
Unit Type and Rh: 1700

## 2019-04-07 LAB — CBC
HCT: 25.8 % — ABNORMAL LOW (ref 39.0–52.0)
Hemoglobin: 8 g/dL — ABNORMAL LOW (ref 13.0–17.0)
MCH: 23.6 pg — ABNORMAL LOW (ref 26.0–34.0)
MCHC: 31 g/dL (ref 30.0–36.0)
MCV: 76.1 fL — ABNORMAL LOW (ref 80.0–100.0)
Platelets: 449 10*3/uL — ABNORMAL HIGH (ref 150–400)
RBC: 3.39 MIL/uL — ABNORMAL LOW (ref 4.22–5.81)
RDW: 20.9 % — ABNORMAL HIGH (ref 11.5–15.5)
WBC: 7.2 10*3/uL (ref 4.0–10.5)
nRBC: 0 % (ref 0.0–0.2)

## 2019-04-07 MED ORDER — OXYCODONE HCL 5 MG PO TABS
5.0000 mg | ORAL_TABLET | ORAL | Status: DC | PRN
  Administered 2019-04-07 – 2019-04-08 (×3): 10 mg via ORAL
  Filled 2019-04-07 (×4): qty 2

## 2019-04-07 MED ORDER — DOCUSATE SODIUM 100 MG PO CAPS
100.0000 mg | ORAL_CAPSULE | Freq: Two times a day (BID) | ORAL | Status: DC
  Administered 2019-04-07 – 2019-04-08 (×3): 100 mg via ORAL
  Filled 2019-04-07 (×3): qty 1

## 2019-04-07 MED ORDER — FAMOTIDINE 20 MG PO TABS
20.0000 mg | ORAL_TABLET | Freq: Two times a day (BID) | ORAL | Status: DC | PRN
  Administered 2019-04-07 – 2019-04-08 (×3): 20 mg via ORAL
  Filled 2019-04-07 (×3): qty 1

## 2019-04-07 MED ORDER — PANTOPRAZOLE SODIUM 40 MG PO TBEC
40.0000 mg | DELAYED_RELEASE_TABLET | Freq: Two times a day (BID) | ORAL | Status: DC
  Administered 2019-04-07 – 2019-04-08 (×2): 40 mg via ORAL
  Filled 2019-04-07 (×2): qty 1

## 2019-04-07 MED ORDER — METHOCARBAMOL 500 MG PO TABS
500.0000 mg | ORAL_TABLET | Freq: Three times a day (TID) | ORAL | Status: DC | PRN
  Administered 2019-04-07: 500 mg via ORAL
  Filled 2019-04-07: qty 1

## 2019-04-07 NOTE — Progress Notes (Signed)
[facility] Progress Note  3 Days Post-Op  Subjective: Mr. Zakowski was seen at bedside. He had no acute events overnight. He continues to report some abdominal discomfort but reports that it is controlled well enough for him to feel comfortable getting out of bed. He is able to ambulate to the bathroom and around the ward. He continues to pass flatus and reports one small BM the day after surgery. His NG tube has been removed and he is tolerating his liquid diet well. He reports increased appetite, requesting to progress to something more substantial such as grits.    Objective: Vital signs in last 24 hours: Temp:  [98.6 F (37 C)] 98.6 F (37 C) (11/02 [id number]) Pulse Rate:  [75-82] 82 (11/02 0633) Resp:  [19] 19 (11/02 0633) BP: (113-120)/(73-77) 120/77 (11/02 [id number]) SpO2:  [100 %] 100 % (11/02 [id number]) Last BM Date: 04/06/19  Intake/Output from previous day: 11/01 0701 - 11/02 0700 In: 2186.5 [I.V.:2136.5; IV Piggyback:50] Out: -  Intake/Output this shift: No intake/output data recorded.  General appearance: alert, cooperative and no distress Resp: work of breathing normal GI: abdomen soft, tene to palpation over LLQ. Honeycomb in place without evidence of infection or irritation. Extremities: extremities normal, atraumatic, no cyanosis or edema Skin: Skin color, texture, turgor normal. No rashes or lesions  Lab Results:  Recent Labs    04/06/19 0639 04/07/19 0554  WBC 9.6 7.2  HGB 8.2* 8.0*  HCT 26.5* 25.8*  PLT 458* 449*   BMET Recent Labs    04/06/19 0639 04/07/19 0554  NA 136 138  K 3.6 4.3  CL 101 103  CO2 25 26  GLUCOSE 112* 101*  BUN 9 8  CREATININE 0.77 0.92  CALCIUM 9.1 9.2   Assessment/Plan: Mr. Morganelli is a 33 year old man with a past medical history of alcoholic pancreatitis with pseudocyst, upper GI bleeding, and and tobacco use now on post-op day 3 from [place] for gastric outlet obstruction secondary to chronic pancreatitis. He  is doing fair. - pain currently managed with scheduled liquid tylenol and roxicodone PRN.  - encouraged OOB, ambulating to restroom and around ward. - tolerating clear liquids well with increased appetite, advance diet to full liquids.  - CTM for BM. Start colace100mg  2x/day. - Given Hbg down to 8 from 8.2 yesterday, will continue to hold Lovenox. Continue SCDs.   LOS: 7 days    Alanda Slim 04/07/2019

## 2019-04-07 NOTE — TOC Progression Note (Signed)
Transition of Care [facility]) - Progression Note    Patient Details  Name: David Miranda MRN: [id number] Date of Birth: 1986/01/17  Transition of Care [facility]) CM/SW Contact  Ihor Gully, LCSW Phone Number: 04/07/2019, 1:44 PM  Clinical Narrative:    Patient's [facility] appointment rescheduled from today to Thursday, April 10, 2019, @ 10:00 a.m.   Expected Discharge Plan: Home/Self Care Barriers to Discharge: Continued Medical Work up  Expected Discharge Plan and Services Expected Discharge Plan: Home/Self Care                                               Social Determinants of Health (SDOH) Interventions    Readmission Risk Interventions No flowsheet data found.

## 2019-04-07 NOTE — Progress Notes (Signed)
Patient Demographics:    David Miranda, is a 33 y.o. male, DOB - 28-Dec-1985, XLK:[id number]  Admit date - 03/30/2019   Admitting Physician Jahnay Lantier Mariea Clonts, MD  Outpatient Primary MD for the patient is Patient, No Pcp Per  LOS - 7   Chief Complaint  Patient presents with  . Abdominal Pain        Subjective:    David Miranda today has no fevers,   No chest pain,    -  -If has BM and tolerating oral intake possible discharge home - if okay by general surgery  Assessment  & Plan :    Principal Problem:   Emesis, persistent Active Problems:   Acute alcoholic pancreatitis   Alcohol abuse   Tobacco abuse   Cannabis abuse   Esophagitis   Pancreatic pseudocyst/cyst--alcoholic pancreatitis   GI bleed   Abdominal pain   Gastric outlet obstruction  CT abdomen and pelvis from 03/18/2019 1)Marked distal esophageal thickening and mucosal hyperemia with some adjacent reactive free fluid in the lower mediastinum, consistent with esophagitis. 2. Markedly distended stomach with ingested material. 3. Multiple hypoattenuating cystic foci throughout the pancreaslikely reflect pancreatic pseudocyst/sequela of prior pancreatitis. The largest cystic pancreatic lesion measuring 1.8 cm  Brief summary -33 year old alcoholic male with history of esophagitis and recurrent pancreatitis with pseudocyst as well as chronic anemia admitted on 03/30/2019 with intractable emesis with [employer] looking contents, hemoglobin dropped to 6.2 required transfusion of packed cells on 03/30/2019, EGD on 04/01/2019 with esophagitis and gastritis ,upper GI series from 04/02/2019 suggesting obstruction, NG tube placed 04/03/2019, s/p gastrojejunostomy on 04/04/2019  A/p 1)Severe Alcoholic Esophagitis and Gastritis/GOO --continue  Protonix , s/p EGD on 04/01/2019 with erosive esophagitis with bleeding, gastric and duodenal evaluation  aborted due to significant amount of retained food in the stomach despite patient being n.p.o -Continue Protonix  -UGI from 04/02/2019 suggesting obstruction -Surgical consult appreciated -As needed Zofran S/p  gastrojejunostomy for decompression on 04/04/2019, gastic outlet obstruction presumed 2/2 chronic pancreatitis with pseudocyst.  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-- as needed Zofran and oxycodione-Continue Protonix ---c/n Creon with meals -Patient is advised to have abdominal imaging surveillance of his pancreatic pseudocyst every 6 months for the next 2 years to confirm stability  3)History of alcohol Abuse--- patient states he has not had a drink for over a week PTA-continue  folic acid and thiamine as ordered -No evidence of DTs at this time  4)Thrombocytosis--suspect this is reactive, hydrate, -Platelets continues to trend down   5)Acute on Chronic Anemia--suspect due to acute blood loss in the setting of intractable emesis -Hemoglobin is up to 8.0 from 6.2 after transfusion of 2 unit of PRBC on 03/30/2019.  On admission hemoglobin was 8.0 .  It was 9.2 about 12 days PTA and   previous baseline of 11 -Continue to monitor H&H and transfuse as clinically indicated -Stool occult blood is negative -EGD as noted above with erosive esophagitis and bleeding   Disposition/Need for in-Hospital Stay- patient unable to be discharged at this time due to  -S/p gastrojejunostomy on 04/04/2019--awaiting return of bowel function and tolerance of oral intake  Code Status : Full code  Family Communication:    (patient is alert, awake and coherent) -  Discussed with significant other  Disposition Plan  : -If has BM and tolerating oral intake possible discharge home - if okay by general surgery  Procedure- EGD on 04/01/2019 with erosive esophagitis with bleeding, gastric and duodenal evaluation aborted due to significant amount of retained food in the  stomach despite patient being n.p.o.  Procedure:- 04/02/2019-UGI IMPRESSION: Marked distention of the stomach. Small amount of contrast is identified within the duodenal wall but no further passage of contrast into the duodenum or small bowel loops is identified by 2 hours consistent with obstruction. Gastroesophageal reflux.  04/04/2019 Gastrojejunostomy (loop) --by Dr. Henreitta Leber  Consults  :  Gi/general surgery  DVT Prophylaxis  :  SCDs (GI bleed)  Lab Results  Component Value Date   PLT 449 (H) 04/07/2019    Inpatient Medications  Scheduled Meds: . sodium chloride   Intravenous Once  . sodium chloride   Intravenous Once  . acetaminophen (TYLENOL) oral liquid 160 mg/5 mL  1,000 mg Oral Q6H  . docusate sodium  100 mg Oral BID  . lidocaine  5 mL Mouth/Throat Once  . nicotine  21 mg Transdermal Daily  . ondansetron (ZOFRAN) IV  4 mg Intravenous TID WC & HS  . pantoprazole  40 mg Oral BID  . sodium chloride flush  3 mL Intravenous Q12H  . thiamine  100 mg Intravenous Daily  . traZODone  100 mg Oral QHS   Continuous Infusions: . sodium chloride    . dextrose 5 % and 0.45 % NaCl with KCl 20 mEq/L 100 mL/hr at 04/07/19 1400  . methocarbamol (ROBAXIN) IV Stopped (04/06/19 1120)   PRN Meds:.sodium chloride, albuterol, alum & mag hydroxide-simeth, bisacodyl, guaiFENesin-dextromethorphan, HYDROmorphone (DILAUDID) injection, loratadine, LORazepam, methocarbamol (ROBAXIN) IV, ondansetron **OR** [DISCONTINUED] ondansetron (ZOFRAN) IV, oxyCODONE, phenol, sodium chloride, sodium chloride flush   Anti-infectives (From admission, onward)   Start     Dose/Rate Route Frequency Ordered Stop   04/04/19 1300  ceFAZolin (ANCEF) IVPB 2g/100 mL premix     2 g 200 mL/hr over 30 Minutes Intravenous On call to O.R. 04/03/19 2222 04/04/19 1330        Objective:   Vitals:   04/06/19 0612 04/07/19 0000 04/07/19 0633 04/07/19 0900  BP: 122/76 113/73 120/77 (!) 136/97  Pulse: 80 75 82 61   Resp: 18 19 19 18   Temp: 98 F (36.7 C) 98.6 F (37 C) 98.6 F (37 C) 98.2 F (36.8 C)  TempSrc:    Oral  SpO2: 96% 100% 100% 100%  Weight:      Height:        Wt Readings from Last 3 Encounters:  04/04/19 71 kg  03/18/19 77.1 kg  07/29/18 80.1 kg     Intake/Output Summary (Last 24 hours) at 04/07/2019 1527 Last data filed at 04/07/2019 1400 Gross per 24 hour  Intake 3060.38 ml  Output -  Net 3060.38 ml    Physical Exam  Gen:- Awake Alert, resting comfortably after pain medications HEENT:- [place].AT, No sclera icterus Nose- NG tube-to intermittent wall suction Neck-Supple Neck,No JVD,.  Lungs-  CTAB , fair symmetrical air movement CV- S1, S2 normal, regular  Abd-  +ve B.Sounds, Abd Soft, appropriate postop tenderness  extremity/Skin:- No  edema, pedal pulses present  Psych-affect is appropriate, oriented x3 Neuro-no new focal deficits, no tremors   Data Review:   Micro Results Recent Results (from the past 240 hour(s))  SARS CORONAVIRUS 2 (TAT 6-24 HRS) Nasopharyngeal Nasopharyngeal Swab     Status: None  Collection Time: 03/30/19  9:24 AM   Specimen: Nasopharyngeal Swab  Result Value Ref Range Status   SARS Coronavirus 2 NEGATIVE NEGATIVE Final    Comment: (NOTE) SARS-CoV-2 target nucleic acids are NOT DETECTED. The SARS-CoV-2 RNA is generally detectable in upper and lower respiratory specimens during the acute phase of infection. Negative results do not preclude SARS-CoV-2 infection, do not rule out co-infections with other pathogens, and should not be used as the sole basis for treatment or other patient management decisions. Negative results must be combined with clinical observations, patient history, and epidemiological information. The expected result is Negative. Fact Sheet for Patients: HairSlick.no Fact Sheet for Healthcare Providers: quierodirigir.com This test is not yet approved or cleared by  the [place] FDA and  has been authorized for detection and/or diagnosis of SARS-CoV-2 by FDA under an Emergency Use Authorization (EUA). This EUA will remain  in effect (meaning this test can be used) for the duration of the COVID-19 declaration under Section 56 4(b)(1) of the Act, 21 U.S.C. section 360bbb-3(b)(1), unless the authorization is terminated or revoked sooner. Performed at [facility], [address], [place], [place] [address]     Radiology Reports Dg Abd 1 View  Result Date: 04/02/2019 CLINICAL DATA:  Abdominal pain following upper GI EXAM: ABDOMEN - 1 VIEW COMPARISON:  None. FINDINGS: Scattered large and small bowel gas is noted. Contrast material remains within the stomach from the recent upper GI. Some contrast has passed into the more distal small bowel. No contrast is noted within the colon. No obstructive changes are seen. IMPRESSION: Contrast material within the more distal small bowel. No obstructive changes are noted. Considerable retained contrast is noted within the stomach Electronically Signed   By: Alcide Clever M.D.   On: 04/02/2019 20:10   Ct Abdomen Pelvis W Contrast  Result Date: 03/18/2019 CLINICAL DATA:  Emesis and vomiting for 4 days EXAM: CT ABDOMEN AND PELVIS WITH CONTRAST TECHNIQUE: Multidetector CT imaging of the abdomen and pelvis was performed using the standard protocol following bolus administration of intravenous contrast. CONTRAST:  OMNIPAQUE IOHEXOL 300 MG/ML  SOLN COMPARISON:  CT 07/26/2018 FINDINGS: Lower chest: Dependent atelectasis. Lung bases are otherwise clear. Normal heart size. No pericardial effusion. Hepatobiliary: No focal liver abnormality is seen. No gallstones, gallbladder wall thickening, or biliary dilatation. 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 Largest fluid attenuation cystic lesion measures up to 1.8 cm at the  pancreatic head neck junction. Note definitive connection to the main pancreatic duct. No pancreatic ductal dilatation or peripancreatic inflammation is present at this time. Spleen: Normal in size without focal abnormality. Adrenals/Urinary Tract: Normal adrenal glands. Few fluid attenuation cysts in the kidneys are unchanged from prior. No concerning renal lesions. No hydronephrosis or urolithiasis. Normal urinary bladder. Stomach/Bowel: Marked distal esophageal thickening and mucosal hyperemia with some adjacent reactive free fluid in the lower mediastinum. The stomach is markedly distended with ingested material. Duodenal sweep takes a normal anatomic course. No small bowel dilatation or wall thickening. A normal appendix is visualized. No colonic dilatation or wall thickening. Vascular/Lymphatic: The aorta is normal caliber. Few reactive upper abdominal lymph nodes. No pathologically enlarged abdominopelvic nodes. Reproductive: The prostate and seminal vesicles are unremarkable. Other: No abdominopelvic free fluid or free gas. No bowel containing hernias. Musculoskeletal: No acute osseous abnormality or suspicious osseous lesion. IMPRESSION: 1. Marked distal esophageal thickening and mucosal hyperemia with some adjacent reactive free fluid in the  lower mediastinum, consistent with esophagitis. 2. Markedly distended stomach with ingested material. 3. 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 No pancreatic ductal dilatation or peripancreatic inflammation at this time. The largest cystic pancreatic lesion measuring 1.8 cm assess detailed six-month follow-up imaging for at least 2 years of stability. This recommendation follows ACR consensus guidelines: Management of Incidental Pancreatic Cysts: A White Paper of the ACR Incidental Findings Committee. J Am Coll Radiol 2017;14:911-923. Electronically Signed   By: Kreg Shropshire M.D.    On: 03/18/2019 06:13   Dg Abdomen Acute W/chest  Result Date: 03/30/2019 CLINICAL DATA:  Abdominal pain and nausea EXAM: DG ABDOMEN ACUTE W/ 1V CHEST COMPARISON:  Abdomen series March 26, 2018 FINDINGS: PA chest: No edema or consolidation. Heart size and pulmonary vascularity are normal. No adenopathy. Supine and upright abdomen: There is moderate stool in the colon. There is no bowel dilatation or air-fluid level to suggest bowel obstruction. No free air. No abnormal calcifications. IMPRESSION: Moderate stool in colon. No bowel obstruction or free air evident. Lungs clear. Electronically Signed   By: Bretta Bang III M.D.   On: 03/30/2019 08:26   Dg Kayleen Memos W Double Cm (hd Ba)  Result Date: 04/02/2019 CLINICAL DATA:  Severe nausea and vomiting, gastric distention, retained food debris on endoscopy, question gastric outlet obstruction; history of pancreatitis, GERD, smoking, alcohol abuse EXAM: UPPER GI SERIES WITH KUB TECHNIQUE: After obtaining a scout radiograph a routine upper GI series was performed using thin and high density barium. FLUOROSCOPY TIME:  Fluoroscopy Time:  0 minutes 42 seconds Radiation Exposure Index (if provided by the fluoroscopic device): 44.8 mGy Number of Acquired Spot Images: 6 plus multiple fluoroscopic screen captures COMPARISON:  03/30/2019 FINDINGS: Marked gastric distention on scout image. Stool throughout colon. Nonobstructive bowel gas pattern. Esophagus normal appearance, demonstrate normal distention and motility. No esophageal narrowing or mucosal/wall irregularity. Marked gastric distension. Some retained food debris. Many of the rugal folds appear effaced due to degree of gastric distention. Observed rugal folds normal appearance. No definite mass or ulceration. Several images demonstrate apparent contrast within the duodenal bulb though no passage of contrast beyond this site is identified on a 2 hour delayed image. Gastroesophageal reflux noted. IMPRESSION:  Marked distention of the stomach. Small amount of contrast is identified within the duodenal wall but no further passage of contrast into the duodenum or small bowel loops is identified by 2 hours consistent with obstruction. Gastroesophageal reflux. Electronically Signed   By: Ulyses Southward M.D.   On: 04/02/2019 12:31    CBC Recent Labs  Lab 04/02/19 0723 04/04/19 0618 04/05/19 0517 04/06/19 0639 04/07/19 0554  WBC 13.8* 8.9 10.4 9.6 7.2  HGB 9.6* 8.5* 8.6* 8.2* 8.0*  HCT 31.2* 28.1* 28.7* 26.5* 25.8*  PLT 710* 587* 513* 458* 449*  MCV 77.0* 77.0* 76.3* 74.9* 76.1*  MCH 23.7* 23.3* 22.9* 23.2* 23.6*  MCHC 30.8 30.2 30.0 30.9 31.0  RDW 19.9* 20.3* 21.1* 20.7* 20.9*    Chemistries  Recent Labs  Lab 04/02/19 0723 04/04/19 0618 04/05/19 0517 04/06/19 0639 04/07/19 0554  NA 138 137 135 136 138  K 3.8 3.7 3.7 3.6 4.3  CL 100 101 102 101 103  CO2 26 27 25 25 26   GLUCOSE 129* 133* 131* 112* 101*  BUN 9 9 6 9 8   CREATININE 0.87 0.87 0.83 0.77 0.92  CALCIUM 9.5 8.9 8.8* 9.1 9.2  AST  --  10*  --  11*  --  ALT  --  7  --  8  --   ALKPHOS  --  62  --  59  --   BILITOT  --  0.3  --  0.6  --    ------------------------------------------------------------------------------------------------------------------ No results for input(s): CHOL, HDL, LDLCALC, TRIG, CHOLHDL, LDLDIRECT in the last 72 hours.  Lab Results  Component Value Date   HGBA1C 5.2 12/27/2017   ------------------------------------------------------------------------------------------------------------------ No results for input(s): TSH, T4TOTAL, T3FREE, THYROIDAB in the last 72 hours.  Invalid input(s): FREET3 ------------------------------------------------------------------------------------------------------------------ No results for input(s): VITAMINB12, FOLATE, FERRITIN, TIBC, IRON, RETICCTPCT in the last 72 hours.  Coagulation profile No results for input(s): INR, PROTIME in the last 168 hours.  No  results for input(s): DDIMER in the last 72 hours.  Cardiac Enzymes No results for input(s): CKMB, TROPONINI, MYOGLOBIN in the last 168 hours.  Invalid input(s): CK ------------------------------------------------------------------------------------------------------------------ No results found for: BNP  Shon Hale M.D on 04/07/2019 at 3:27 PM  Go to [email or web address] - for contact info  [facility] - Office  [phone]

## 2019-04-07 NOTE — Progress Notes (Signed)
[facility] Progress Note  3 Days Post-Op  Subjective: Doing fair. Tolerated liquids. No complaints of nausea or vomiting. No BM but having flatus.   Objective: Vital signs in last 24 hours: Temp:  [98.2 F (36.8 C)-98.6 F (37 C)] 98.2 F (36.8 C) (11/02 0900) Pulse Rate:  [61-82] 61 (11/02 0900) Resp:  [18-19] 18 (11/02 0900) BP: (113-136)/(73-97) 136/97 (11/02 0900) SpO2:  [100 %] 100 % (11/02 0900) Last BM Date: 04/06/19  Intake/Output from previous day: 11/01 0701 - 11/02 0700 In: 2186.5 [I.V.:2136.5; IV Piggyback:50] Out: -  Intake/Output this shift: No intake/output data recorded.  General appearance: alert, cooperative and no distress Resp: normal work of breathing GI: soft, non distended, appropriately tender, midline with staples c/d/i with honeycomb, no drainage or erythema  Lab Results:  Recent Labs    04/06/19 0639 04/07/19 0554  WBC 9.6 7.2  HGB 8.2* 8.0*  HCT 26.5* 25.8*  PLT 458* 449*   BMET Recent Labs    04/06/19 0639 04/07/19 0554  NA 136 138  K 3.6 4.3  CL 101 103  CO2 25 26  GLUCOSE 112* 101*  BUN 9 8  CREATININE 0.77 0.92  CALCIUM 9.1 9.2     Assessment/Plan: Mr. Smeby is a 33 yo s/pGASTROJEJUNOSTOMYdue to gastric outlet obstruction from pancreatitis.  -Changed to pill form of medications roxicodone 5-10 q4PRN  Encouraged OOB and ambulating Full liquid diet, can have grits and mashed potatoes  Colace added, needs to have BM today SCDs, H&H still drifting but no signs of bleeding, is ambulating  Protonix changed to pill form Hopefully home tomorrow if has BM and tolerates fulls    LOS: 7 days    Virl Cagey 04/07/2019

## 2019-04-08 ENCOUNTER — Encounter (HOSPITAL_COMMUNITY): Payer: Self-pay | Admitting: General Surgery

## 2019-04-08 MED ORDER — ACETAMINOPHEN 325 MG PO TABS: 650.0000 mg | ORAL_TABLET | ORAL | 2 refills | Status: DC | PRN

## 2019-04-08 MED ORDER — OXYCODONE HCL 5 MG PO TABS: 5.0000 mg | ORAL_TABLET | ORAL | 0 refills | Status: DC | PRN

## 2019-04-08 MED ORDER — NICOTINE 14 MG/24HR TD PT24: 14.0000 mg | MEDICATED_PATCH | Freq: Every day | TRANSDERMAL | 0 refills | Status: AC

## 2019-04-08 MED ORDER — DOCUSATE SODIUM 100 MG PO CAPS: 100.0000 mg | ORAL_CAPSULE | Freq: Two times a day (BID) | ORAL | 1 refills | Status: DC

## 2019-04-08 MED ORDER — RANITIDINE HCL 300 MG PO TABS: 300.0000 mg | ORAL_TABLET | Freq: Every day | ORAL | 1 refills | Status: DC

## 2019-04-08 MED ORDER — TRAZODONE HCL 100 MG PO TABS: 100.0000 mg | ORAL_TABLET | Freq: Every day | ORAL | 2 refills | Status: DC

## 2019-04-08 MED ORDER — ADULT MULTIVITAMIN W/MINERALS CH: 1.0000 | ORAL_TABLET | Freq: Every day | ORAL | 2 refills | Status: DC

## 2019-04-08 MED ORDER — SENNOSIDES-DOCUSATE SODIUM 8.6-50 MG PO TABS: 2.0000 | ORAL_TABLET | Freq: Every day | ORAL | 1 refills | Status: AC

## 2019-04-08 MED ORDER — ONDANSETRON HCL 4 MG PO TABS: 4.0000 mg | ORAL_TABLET | Freq: Four times a day (QID) | ORAL | 1 refills | Status: DC | PRN

## 2019-04-08 MED ORDER — PANCRELIPASE (LIP-PROT-AMYL) 36000-114000 UNITS PO CPEP: 72000.0000 [IU] | ORAL_CAPSULE | Freq: Three times a day (TID) | ORAL | 3 refills | Status: DC

## 2019-04-08 MED ORDER — PANTOPRAZOLE SODIUM 40 MG PO TBEC: 40.0000 mg | DELAYED_RELEASE_TABLET | Freq: Two times a day (BID) | ORAL | 2 refills | Status: DC

## 2019-04-08 NOTE — Progress Notes (Signed)
Pt asked by MD to stay through lunch time to ensure that he would have no further vomiting. Pt initially agreed, but once lunch tray arrived, pt ate jello and asked for discharge paper. B/P elevated at discharge, pt stated, "Y'all done waited till I got upset to take my pressure." Pt was upset that he was not allowed to walk to the vending machine for crackers and soda prior to discharge. Pt discharged ambulatory per his request escorted by NT to main entrance with girlfriend at his side.

## 2019-04-08 NOTE — Discharge Summary (Signed)
David Miranda, is a 33 y.o. male  DOB 03-11-1986  MRN [id number].  Admission date:  03/30/2019  Admitting Physician  Roxan Hockey, MD  Discharge Date:  04/08/2019   Primary MD  Patient, No Pcp Per  Recommendations for primary care physician for things to follow:  - -1)Avoid ibuprofen/Advil/Aleve/Motrin/Goody Powders/Naproxen/BC powders/Meloxicam/Diclofenac/Indomethacin and other Nonsteroidal anti-inflammatory medications as these will make you more likely to bleed and can cause stomach ulcers, can also cause Kidney problems.   2) keep your appointment with [facility] for Thursday  3) follow-up with general surgeon Dr. Constance Haw as advised on 04/15/2019 for wound check and staple removal  4)Abstinence from nicotine and alcohol advised--- Please use substance abuse resources/information given to you by case manager/social worker  5)Given your stomach problems please avoid coffee/caffeinated beverages, Tea, chocolate, carbonated drinks/soda,  spicy food, Milk,  Alcohol, acidic foods- such as citrus (Lemon, oranges), juices  and tomatoes, Meats with a high fat content. High-fat condiments and  Fried foods -Soft Diet Advised  Admission Diagnosis  Esophagitis [K20.90]  Discharge Diagnosis  Esophagitis [K20.90]    Principal Problem:   Emesis, persistent Active Problems:   Acute alcoholic pancreatitis   Alcohol abuse   Tobacco abuse   Cannabis abuse   Esophagitis   Pancreatic pseudocyst/cyst--alcoholic pancreatitis   GI bleed   Abdominal pain   Gastric outlet obstruction      Past Medical History:  Diagnosis Date   GERD (gastroesophageal reflux disease)    Pancreatitis     Past Surgical History:  Procedure Laterality Date   ESOPHAGOGASTRODUODENOSCOPY (EGD) WITH PROPOFOL N/A 04/01/2019   Procedure: ESOPHAGOGASTRODUODENOSCOPY (EGD) WITH PROPOFOL;  Surgeon: Danie Binder, MD;  [facility]  [facility];  Service: Endoscopy;  Laterality: N/A;   NO PAST SURGERIES         HPI  from the history and physical done on the day of admission:    - David Miranda  is a 33 y.o. male alcoholic male was been drinking alcohol in excess since his teenage years presents to the ED with ongoing abdominal pain and intractable emesis in the setting of THC and alcohol abuse -Patient was seen in the ED on 03/18/2019 with similar findings, he was discharged with Carafate and PPI.  Symptoms have persisted -Patient states he has not had an alcoholic intake in close to 10 days -Additional history obtained from his wife at bedside - -Patient has been having dark emesis but he has been drinking Coca-Cola, no melena or hematochezia, no bright red blood per rectum - CT abdomen and pelvis from 03/18/2019 1)Marked distal esophageal thickening and mucosal hyperemia with some adjacent reactive free fluid in the lower mediastinum, consistent with esophagitis. 2. Markedly distended stomach with ingested material. 3. Multiple hypoattenuating cystic foci throughout the pancreaslikely reflect pancreatic pseudocyst/sequela of prior pancreatitis. The largest cystic pancreatic lesion measuring 1.8 cm  -Abdominal x-rays today without acute findings, there is concern about constipation -Lipase is up to 159 -Potassium is low at 2.7, platelets over 1100, hemoglobin is down  to 8 -LFTs are not elevated Hemoglobin is down to 8.0 from 9.2 about 12 days ago, and  from a previous baseline of [facility]:   - CT Abdomen and Pelvis from 03/18/2019 1)Marked distal esophageal thickening and mucosal hyperemia with some adjacent reactive free fluid in the lower mediastinum, consistent with esophagitis. 2. Markedly distended stomach with ingested material. 3. Multiple hypoattenuating cystic foci throughout the pancreaslikely reflect pancreatic pseudocyst/sequela of prior pancreatitis. The largest cystic  pancreatic lesion measuring 1.8 cm  Brief Summary -33 year old alcoholic male with history of esophagitis and recurrent pancreatitis with pseudocyst as well as chronic anemia admitted on 03/30/2019 with intractable emesis with Coca-Cola looking contents, hemoglobin dropped to 6.2 required transfusion of packed cells on 03/30/2019, EGD on 04/01/2019 with esophagitis and gastritis ,upper GI series from 04/02/2019 suggesting obstruction, NG tube placed 04/03/2019, s/p gastrojejunostomy on 04/04/2019  A/p 1)SevereAlcoholicEsophagitis andGastritis/GOO --overall much improved, okay to discharge on   Protonix , s/p EGD on 04/01/2019 with erosive esophagitis with bleeding, gastric and duodenal evaluation aborted due to significant amount of retained food in the stomach despite patient being n.p.o -UGI from 04/02/2019 suggesting obstruction -Surgical consult appreciated --Okay to discharge on Zofran as needed S/p  gastrojejunostomy for decompression on 04/04/2019, gastic outlet obstruction presumed 2/2 chronic pancreatitiswithpseudocyst.  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-- as needed Zofran and oxycodone-Continue Protonix ---c/n Creon with meals -Patient is advised to have abdominal imaging surveillance of his pancreatic pseudocyst every 6 months for the next 2 years to confirm stability  3)History of alcoholAbuse---patient states he has not had a drink for over a week PTA-continue   multivitamin -Outpatient alcohol rehab program advised  4)Thrombocytosis--suspect this is reactive, hydrate, -Platelets continues to trend down   5)Acute on Chronic Anemia--suspect due to acute blood loss in the setting of intractable emesis -Hemoglobin is up to 8.0 from 6.2 after transfusion of 2 unit of PRBC this admission . on admission hemoglobin was 8.0 .  It was 9.2about12 days PTA and previous baseline of 11 - -Stool occult blood is negative -EGD as noted  above with erosive esophagitis and bleeding -Repeat CBC with general surgery in 1 week   Disposition -discharge homeCode Status : Full code  Family Communication:    (patient is alert, awake and coherent) -Discussed with significant other  Procedure- EGD on 04/01/2019 with erosive esophagitis with bleeding, gastric and duodenal evaluation aborted due to significant amount of retained food in the stomach despite patient being n.p.o.  Procedure:- 04/02/2019-UGI IMPRESSION: Marked distention of the stomach. Small amount of contrast is identified within the duodenal wall but no further passage of contrast into the duodenum or small bowel loops is identified by 2 hours consistent with obstruction. Gastroesophageal reflux.  04/04/2019 Gastrojejunostomy(loop)--by Dr. Constance Haw  Consults  :  Gi/general surgery   Discharge Condition: stable  Follow UP  Follow-up Information    Rehman, Mechele Dawley, MD. Schedule an appointment as soon as possible for a visit in 2 days.   Specialty: Gastroenterology Contact information: [address], [address] [place] [address] [phone]        [facility] [place] Follow up on 04/10/2019.   Why: Please follow up with [facility] on Thursday, November 5th at 10:00am. Contact information: [address] [place], [place] [address] [phone]       Virl Cagey, MD Follow up on 04/15/2019.   Specialty: General Surgery Why: Staple removal; if you have not been  contacted with a time by the end of this week, call the office Contact information: [address] [place] [address] [phone]           Diet and Activity recommendation:  As advised  Discharge Instructions    Discharge Instructions    Call MD for:  difficulty breathing, headache or visual disturbances   Complete by: As directed    Call MD for:  persistant dizziness or light-headedness   Complete by: As directed    Call MD for:   persistant nausea and vomiting   Complete by: As directed    Call MD for:  severe uncontrolled pain   Complete by: As directed    Call MD for:  temperature >100.4   Complete by: As directed    Diet general   Complete by: As directed    Given your stomach problems please avoid coffee/caffeinated beverages, Tea, chocolate, carbonated drinks/soda,  spicy food, Milk,  Alcohol, acidic foods- such as citrus (Lemon, oranges), juices  and tomatoes, Meats with a high fat content. High-fat condiments and  Fried foods   Discharge instructions   Complete by: As directed    -1)Avoid ibuprofen/Advil/Aleve/Motrin/Goody Powders/Naproxen/BC powders/Meloxicam/Diclofenac/Indomethacin and other Nonsteroidal anti-inflammatory medications as these will make you more likely to bleed and can cause stomach ulcers, can also cause Kidney problems.   2) keep your appointment with [facility] for Thursday  3) follow-up with general surgeon Dr. Constance Haw as advised on 04/15/2019 for wound check and staple removal  4)Abstinence from nicotine and alcohol advised--- Please use substance abuse resources/information given to you by case manager/social worker  5)Given your stomach problems please avoid coffee/caffeinated beverages, Tea, chocolate, carbonated drinks/soda,  spicy food, Milk,  Alcohol, acidic foods- such as citrus (Lemon, oranges), juices  and tomatoes, Meats with a high fat content. High-fat condiments and  Fried foods -Soft Diet Advised   Increase activity slowly   Complete by: As directed        Discharge Medications     Allergies as of 04/08/2019   No Known Allergies     Medication List    STOP taking these medications   diphenhydramine-acetaminophen 25-500 MG Tabs tablet Commonly known as: TYLENOL PM   omeprazole 40 MG capsule Commonly known as: PRILOSEC     TAKE these medications   acetaminophen 325 MG tablet Commonly known as: Tylenol Take 2 tablets (650 mg total) by mouth every 4  (four) hours as needed for mild pain, fever or headache.   lipase/protease/amylase 36000 UNITS Cpep capsule Commonly known as: CREON Take 2 capsules (72,000 Units total) by mouth 3 (three) times daily with meals.   multivitamin with minerals Tabs tablet Take 1 tablet by mouth daily. What changed:   when to take this  reasons to take this   nicotine 14 mg/24hr patch Commonly known as: NICODERM CQ - dosed in mg/24 hours Place 1 patch (14 mg total) onto the skin daily.   ondansetron 4 MG tablet Commonly known as: ZOFRAN Take 1 tablet (4 mg total) by mouth every 6 (six) hours as needed for nausea.   oxyCODONE 5 MG immediate release tablet Commonly known as: Oxy IR/ROXICODONE Take 1-2 tablets (5-10 mg total) by mouth every 4 (four) hours as needed for moderate pain.   pantoprazole 40 MG tablet Commonly known as: PROTONIX Take 1 tablet (40 mg total) by mouth 2 (two) times daily. What changed: when to take this   ranitidine 300 MG tablet Commonly known as: ZANTAC Take 1  tablet (300 mg total) by mouth at bedtime.   senna-docusate 8.6-50 MG tablet Commonly known as: Senokot-S Take 2 tablets by mouth at bedtime.   traZODone 100 MG tablet Commonly known as: DESYREL Take 1 tablet (100 mg total) by mouth at bedtime.       Major procedures and Radiology Reports - PLEASE review detailed and final reports for all details, in brief -    Dg Abd 1 View  Result Date: 04/02/2019 CLINICAL DATA:  Abdominal pain following upper GI EXAM: ABDOMEN - 1 VIEW COMPARISON:  None. FINDINGS: Scattered large and small bowel gas is noted. Contrast material remains within the stomach from the recent upper GI. Some contrast has passed into the more distal small bowel. No contrast is noted within the colon. No obstructive changes are seen. IMPRESSION: Contrast material within the more distal small bowel. No obstructive changes are noted. Considerable retained contrast is noted within the stomach  Electronically Signed   By: Inez Catalina M.D.   On: 04/02/2019 20:10   Ct Abdomen Pelvis W Contrast  Result Date: 03/18/2019 CLINICAL DATA:  Emesis and vomiting for 4 days EXAM: CT ABDOMEN AND PELVIS WITH CONTRAST TECHNIQUE: Multidetector CT imaging of the abdomen and pelvis was performed using the standard protocol following bolus administration of intravenous contrast. CONTRAST:  169mL OMNIPAQUE IOHEXOL 300 MG/ML  SOLN COMPARISON:  CT 07/26/2018 FINDINGS: Lower chest: Dependent atelectasis. Lung bases are otherwise clear. Normal heart size. No pericardial effusion. Hepatobiliary: No focal liver abnormality is seen. No gallstones, gallbladder wall thickening, or biliary dilatation. 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 Largest fluid attenuation cystic lesion measures up to 1.8 cm at the pancreatic head neck junction. Note definitive connection to the main pancreatic duct. No pancreatic ductal dilatation or peripancreatic inflammation is present at this time. Spleen: Normal in size without focal abnormality. Adrenals/Urinary Tract: Normal adrenal glands. Few fluid attenuation cysts in the kidneys are unchanged from prior. No concerning renal lesions. No hydronephrosis or urolithiasis. Normal urinary bladder. Stomach/Bowel: Marked distal esophageal thickening and mucosal hyperemia with some adjacent reactive free fluid in the lower mediastinum. The stomach is markedly distended with ingested material. Duodenal sweep takes a normal anatomic course. No small bowel dilatation or wall thickening. A normal appendix is visualized. No colonic dilatation or wall thickening. Vascular/Lymphatic: The aorta is normal caliber. Few reactive upper abdominal lymph nodes. No pathologically enlarged abdominopelvic nodes. Reproductive: The prostate and seminal vesicles are unremarkable. Other: No abdominopelvic free fluid or free  gas. No bowel containing hernias. Musculoskeletal: No acute osseous abnormality or suspicious osseous lesion. IMPRESSION: 1. Marked distal esophageal thickening and mucosal hyperemia with some adjacent reactive free fluid in the lower mediastinum, consistent with esophagitis. 2. Markedly distended stomach with ingested material. 3. 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 No pancreatic ductal dilatation or peripancreatic inflammation at this time. The largest cystic pancreatic lesion measuring 1.8 cm assess detailed six-month follow-up imaging for at least 2 years of stability. This recommendation follows ACR consensus guidelines: Management of Incidental Pancreatic Cysts: A White Paper of the ACR Incidental Findings Committee. [place] [id number]. Electronically Signed   By: Lovena Le M.D.   On: 03/18/2019 06:13   Dg Abdomen Acute W/chest  Result Date: 03/30/2019 CLINICAL DATA:  Abdominal pain and nausea EXAM: DG ABDOMEN ACUTE W/ 1V CHEST COMPARISON:  Abdomen series March 26, 2018 FINDINGS: PA chest: No edema or  consolidation. Heart size and pulmonary vascularity are normal. No adenopathy. Supine and upright abdomen: There is moderate stool in the colon. There is no bowel dilatation or air-fluid level to suggest bowel obstruction. No free air. No abnormal calcifications. IMPRESSION: Moderate stool in colon. No bowel obstruction or free air evident. Lungs clear. Electronically Signed   By: Lowella Grip III M.D.   On: 03/30/2019 08:26   Dg Duanne Limerick W Double Cm (hd Ba)  Result Date: 04/02/2019 CLINICAL DATA:  Severe nausea and vomiting, gastric distention, retained food debris on endoscopy, question gastric outlet obstruction; history of pancreatitis, GERD, smoking, alcohol abuse EXAM: UPPER GI SERIES WITH KUB TECHNIQUE: After obtaining a scout radiograph a routine upper GI series was performed using  thin and high density barium. FLUOROSCOPY TIME:  Fluoroscopy Time:  0 minutes 42 seconds Radiation Exposure Index (if provided by the fluoroscopic device): 44.8 mGy Number of Acquired Spot Images: 6 plus multiple fluoroscopic screen captures COMPARISON:  03/30/2019 FINDINGS: Marked gastric distention on scout image. Stool throughout colon. Nonobstructive bowel gas pattern. Esophagus normal appearance, demonstrate normal distention and motility. No esophageal narrowing or mucosal/wall irregularity. Marked gastric distension. Some retained food debris. Many of the rugal folds appear effaced due to degree of gastric distention. Observed rugal folds normal appearance. No definite mass or ulceration. Several images demonstrate apparent contrast within the duodenal bulb though no passage of contrast beyond this site is identified on a 2 hour delayed image. Gastroesophageal reflux noted. IMPRESSION: Marked distention of the stomach. Small amount of contrast is identified within the duodenal wall but no further passage of contrast into the duodenum or small bowel loops is identified by 2 hours consistent with obstruction. Gastroesophageal reflux. Electronically Signed   By: Lavonia Dana M.D.   On: 04/02/2019 12:31    Micro Results   Recent Results (from the past 240 hour(s))  SARS CORONAVIRUS 2 (TAT 6-24 HRS) Nasopharyngeal Nasopharyngeal Swab     Status: None   Collection Time: 03/30/19  9:24 AM   Specimen: Nasopharyngeal Swab  Result Value Ref Range Status   SARS Coronavirus 2 NEGATIVE NEGATIVE Final    Comment: (NOTE) SARS-CoV-2 target nucleic acids are NOT DETECTED. The SARS-CoV-2 RNA is generally detectable in upper and lower respiratory specimens during the acute phase of infection. Negative results do not preclude SARS-CoV-2 infection, do not rule out co-infections with other pathogens, and should not be used as the sole basis for treatment or other patient management decisions. Negative results must  be combined with clinical observations, patient history, and epidemiological information. The expected result is Negative. Fact Sheet for Patients: SugarRoll.be Fact Sheet for Healthcare Providers: https://www.woods-mathews.com/ This test is not yet approved or cleared by the [place] FDA and  has been authorized for detection and/or diagnosis of SARS-CoV-2 by FDA under an Emergency Use Authorization (EUA). This EUA will remain  in effect (meaning this test can be used) for the duration of the COVID-19 declaration under Section 56 4(b)(1) of the Act, 21 U.S.C. section 360bbb-3(b)(1), unless the authorization is terminated or revoked sooner. Performed at [facility], [place] [address] [place], [place] [address]      Today   Subjective    David Miranda today has no new complaints, -Patient did have emesis, also had BM, requesting discharge home, No fevers no chills   Patient has been seen and examined prior to discharge   Objective   Blood pressure (!) 125/95, pulse (!) 112, temperature 98.7 F (37.1 C), temperature  source Oral, resp. rate 18, height 6' (1.829 m), weight 71 kg, SpO2 98 %.   Intake/Output Summary (Last 24 hours) at 04/08/2019 1102 Last data filed at 04/08/2019 0656 Gross per 24 hour  Intake 2787.06 ml  Output --  Net 2787.06 ml   Exam Gen:- Awake Alert, no acute distress  HEENT:- .AT, No sclera icterus Neck-Supple Neck,No JVD,.  Lungs-  CTAB , good air movement bilaterally  CV- S1, S2 normal, regular Abd-  +ve B.Sounds, Abd Soft, postop wound looks clean dry and intact  extremity/Skin:- No  edema,   good pulses Psych-affect is appropriate, oriented x3 Neuro-no new focal deficits, no tremors    Data Review   CBC w Diff:  Lab Results  Component Value Date   WBC 7.2 04/07/2019   HGB 8.0 (L) 04/07/2019   HCT 25.8 (L) 04/07/2019   PLT 449 (H) 04/07/2019   LYMPHOPCT 13 03/30/2019   MONOPCT 9  03/30/2019   EOSPCT 1 03/30/2019   BASOPCT 0 03/30/2019   CMP:  Lab Results  Component Value Date   NA 138 04/07/2019   K 4.3 04/07/2019   CL 103 04/07/2019   CO2 26 04/07/2019   BUN 8 04/07/2019   CREATININE 0.92 04/07/2019   PROT 6.3 (L) 04/06/2019   ALBUMIN 2.9 (L) 04/06/2019   BILITOT 0.6 04/06/2019   ALKPHOS 59 04/06/2019   AST 11 (L) 04/06/2019   ALT 8 04/06/2019    Total Discharge time is about 33 minutes  Roxan Hockey M.D on 04/08/2019 at 11:02 AM  Go to [email or web address] -  for contact info  [facility] - Office  [phone]

## 2019-04-08 NOTE — Progress Notes (Signed)
Central Tele called to notify that pt's heart rate up to 120's sustained. Pt has been vomiting, then ambulated in hallway. Current heart rate per tele is 105 bpm ST.

## 2019-04-08 NOTE — TOC Transition Note (Signed)
Transition of Care [facility]) - CM/SW Discharge Note   Patient Details  Name: David Miranda MRN: [id number] Date of Birth: 08-06-1985  [facility] CM/SW Contact:  Shade Flood, LCSW Phone Number: 04/08/2019, 10:19 AM   Clinical Narrative:     Pt stable for dc today per MD. Pt will return home. He has been given resources for substance abuse treatment and he has follow up at [place] for Thursday.   There are no other TOC needs for dc.  Final next level of care: Home/Self Care Barriers to Discharge: Barriers Resolved   Patient Goals and CMS Choice Patient states their goals for this hospitalization and ongoing recovery are:: to go home.      Discharge Placement                       Discharge Plan and Services   Discharge Planning Services: [facility]                                 Social Determinants of Health (SDOH) Interventions     Readmission Risk Interventions Readmission Risk Prevention Plan 04/08/2019  Transportation Screening Complete  PCP or Specialist Appt within 5-7 Days Complete  Home Care Screening Complete  Medication Review (RN CM) Complete  Some recent data might be hidden

## 2019-04-08 NOTE — Progress Notes (Signed)
Pt given 10am po meds (colace and protonix). Pt then vomited approx 284ml of brown emesis into trashcan. Pt states this happens every time he takes the Protonix pill. Unable to identify either pill in the liquids emesis.  Advised pt would update MD regarding emesis. Pt states, "I'm going home today no matter what, so let him know that."

## 2019-04-08 NOTE — Progress Notes (Signed)
[facility]  Patient doing well. Had a BM. Did have reflux and said he spit up/ vomited a little but has been passing gas.  He is ready to go home.   Follow up in clinic 11/10.  Protonix BID. No smoking or NSAIDs to prevent ulcers. Soft diet for now.   I wrote for his Roxicodone, Colace, Zofran, and Protonix Prescriptions.   Curlene Labrum, MD [facility] [address] [place], [place] [address] [phone] (office)

## 2019-04-08 NOTE — Discharge Instructions (Signed)
-1)Avoid ibuprofen/Advil/Aleve/Motrin/Goody Powders/Naproxen/BC powders/Meloxicam/Diclofenac/Indomethacin and other Nonsteroidal anti-inflammatory medications as these will make you more likely to bleed and can cause stomach ulcers, can also cause Kidney problems.   2) keep your appointment [facility] for Thursday  3) follow-up with general surgeon Dr. Constance Haw as advised on 04/15/2019 for wound check and staple removal  4)Abstinence from nicotine and alcohol advised--- Please use substance abuse resources/information given to you by case manager/social worker  5)Given your stomach problems please avoid coffee/caffeinated beverages, Tea, chocolate, carbonated drinks/soda,  spicy food, Milk,  Alcohol, acidic foods- such as citrus (Lemon, oranges), juices  and tomatoes, Meats with a high fat content. 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 This connection is at risk for ulcer formation.   After surgery to prevent ulcer formation:  No NSAIDS (no ibuprofen, BC powder, Goodie's powder, Aleve, Aspirin). Smoking cessation is key for not developing ulcers.  Take your acid reducing medication as prescribed.  Diet after surgery: Stick to a soft diet after surgery for the first few weeks.   Soft-Food Eating Plan A soft-food eating plan includes foods that are safe and easy to chew and swallow. Your health care provider or dietitian can help you find foods and flavors that fit into this plan. Follow this plan until your health care provider or dietitian says it is safe to start eating other foods and food textures. What are tips for following this plan? General guidelines   Take small bites of food, or cut food into pieces about  inch or smaller. Bite-sized pieces of food are  easier to chew and swallow.  Eat moist foods. Avoid overly dry foods.  Avoid foods that: ? Are difficult to swallow, such as dry, chunky, crispy, or sticky foods. ? Are difficult to chew, such as hard, tough, or stringy foods. ? Contain nuts, seeds, or fruits.  Follow instructions from your dietitian about the types of liquids that are safe for you to swallow. You may be allowed to have: ? Thick liquids only. This includes only liquids that are thicker than honey. ? Thin and thick liquids. This includes all beverages and foods that become liquid at room temperature.  To make thick liquids: ? Purchase a commercial liquid thickening powder. These are available at grocery stores and pharmacies. ? Mix the thickener into liquids according to instructions on the label. ? Purchase ready-made thickened liquids. ? Thicken soup by pureeing, straining to remove chunks, and adding flour, potato flakes, or corn starch. ? Add commercial thickener to foods that become liquid at room temperature, such as milk shakes, yogurt, ice cream, gelatin, and sherbet.  Ask your health care provider whether you need to take a fiber supplement. Cooking  Cook meats so they stay tender and moist. Use methods like braising, stewing, or baking in liquid.  Cook vegetables and fruit until they are soft enough to be mashed with a fork.  Peel soft, fresh fruits such as peaches, nectarines, and melons.  When making soup, make sure chunks of meat and vegetables are smaller than  inch.  Reheat leftover foods slowly so that a tough crust does not form. What foods are allowed? The items listed below may not be a complete list. Talk with your dietitian about what dietary choices are best for you. Grains Breads, muffins,  pancakes, or waffles moistened with syrup, jelly, or butter. Dry cereals well-moistened with milk. Moist, cooked cereals. Well-cooked pasta and rice. Vegetables All soft-cooked vegetables. Shredded  lettuce. Fruits All canned and cooked fruits. Soft, peeled fresh fruits. Strawberries. Dairy Milk. Cream. Yogurt. Cottage cheese. Soft cheese without the rind. Meats and other protein foods Tender, moist ground meat, poultry, or fish. Meat cooked in gravy or sauces. Eggs. Sweets and desserts Ice cream. Milk shakes. Sherbet. Pudding. Fats and oils Butter. Margarine. Olive, canola, sunflower, and grapeseed oil. Smooth salad dressing. Smooth cream cheese. Mayonnaise. Gravy. What foods are not allowed? The items listed bemay not be a complete list. Talk with your dietitian about what dietary choices are best for you. Grains Coarse or dry cereals, such as bran, granola, and shredded wheat. Tough or chewy crusty breads, such as [place] bread or baguettes. Breads with nuts, seeds, or fruit. Vegetables All raw vegetables. Cooked corn. Cooked vegetables that are tough or stringy. Tough, crisp, fried potatoes and potato skins. Fruits Fresh fruits with skins or seeds, or both, such as apples, pears, and grapes. Stringy, high-pulp fruits, such as papaya, pineapple, coconut, and mango. Fruit leather and all dried fruit. Dairy Yogurt with nuts or coconut. Meats and other protein foods Hard, dry sausages. Dry meat, poultry, or fish. Meats with gristle. Fish with bones. Fried meat or fish. Lunch meat and hotdogs. Nuts and seeds. Chunky peanut butter or other nut butters. Sweets and desserts Cakes or cookies that are very dry or chewy. Desserts with dried fruit, nuts, or coconut. Fried pastries. Very rich pastries. Fats and oils Cream cheese with fruit or nuts. Salad dressings with seeds or chunks. Summary  A soft-food eating plan includes foods that are safe and easy to swallow. Generally, the foods should be soft enough to be mashed with a fork.  Avoid foods that are dry, hard to chew, crunchy, sticky, stringy, or crispy.  Ask your health care provider whether you need to thicken your liquids and if  you need to take a fiber supplement. This information is not intended to replace advice given to you by your health care provider. Make sure you discuss any questions you have with your health care provider. Document Released: 08/29/2007 Document Revised: 09/12/2018 Document Reviewed: 07/25/2016 Elsevier Patient Education  [place].  Discharge Open Abdominal Surgery Instructions:  Common Complaints: Pain at the incision site is common. This will improve with time. Take your pain medications as described below. Some nausea is common and poor appetite. The main goal is to stay hydrated the first few days after surgery.   Diet/ Activity: Diet as tolerated. You have started and tolerated a diet in the hospital, and should continue to increase what you are able to eat.   You may not have a large appetite, but it is important to stay hydrated. Drink 64 ounces of water a day. Your appetite will return with time.  Shower per your regular routine daily.  Do not take hot showers. Take warm showers that are less than 10 minutes. Pat the incision dry. Wear an abdominal binder daily with activity. You do not have to wear this while sleeping or sitting.  Rest and listen to your body, but do not remain in bed all day.  Walk everyday for at least 15-20 minutes. Deep cough and move around every 1-2 hours in the first few days after surgery.  Do not lift > 10 lbs, perform excessive bending, pushing, pulling, squatting for 6-8 weeks after surgery.  The activity restrictions and the abdominal binder are to prevent hernia formation at your incision while you are healing.  Do not place lotions or balms on your incision unless instructed to specifically by Dr. Constance Haw.   Medication: Take tylenol for pain control.  Tylenol 1000mg  @ 6am, 12noon, 6pm, 90midnight (Do not exceed 4000mg  of tylenol a day). Take Roxicodone for breakthrough pain every 4 hours.  Take Colace for constipation related to narcotic  pain medication. If you do not have a bowel movement in 2 days, take Miralax over the counter.  Drink plenty of water to also prevent constipation.   Contact Information: If you have questions or concerns, please call our office, [phone], Monday- Thursday 8AM-5PM and Friday 8AM-12Noon.  If it is after hours or on the weekend, please call [facility] Main [facility], [phone], and ask to speak to the surgeon on call for Dr. Constance Haw at [facility].   - -1)Avoid ibuprofen/Advil/Aleve/Motrin/Goody Powders/Naproxen/BC powders/Meloxicam/Diclofenac/Indomethacin and other Nonsteroidal anti-inflammatory medications as these will make you more likely to bleed and can cause stomach ulcers, can also cause Kidney problems.   2) [facility] for [facility]  3) follow-up with general surgeon Dr. Constance Haw as advised on 04/15/2019 for wound check and staple removal  4)Abstinence from nicotine and alcohol advised--- Please use substance abuse resources/information given to you by case manager/social worker  5)Given your stomach problems please avoid coffee/caffeinated beverages, Tea, chocolate, carbonated drinks/soda,  spicy food, Milk,  Alcohol, acidic foods- such as citrus (Lemon, oranges), juices  and tomatoes, Meats with a high fat content. High-fat condiments and  Fried foods -Soft Diet Advised

## 2019-04-10 ENCOUNTER — Telehealth: Payer: Self-pay

## 2019-04-10 ENCOUNTER — Telehealth: Payer: Self-pay | Admitting: General Surgery

## 2019-04-10 MED ORDER — OXYCODONE HCL 5 MG PO TABS: 5.0000 mg | ORAL_TABLET | ORAL | 0 refills | Status: DC | PRN

## 2019-04-10 NOTE — Telephone Encounter (Signed)
Patient called stating that he needed more pain meds. That he would not have enough to make it through the weekend as the prescriptions says it is a 3 day supply. Tried to educate the patient to take 1 pill every 4 hours as needed when possible to make them last longer. Patient stated that he wants Dr Constance Haw to call him, that he would not make it over the weekend. MD notified.

## 2019-04-10 NOTE — Telephone Encounter (Signed)
[facility]  Patient worried about not having enough meds for the weekend. Says he is doing about 5-10mg  every 4 PRN.   Have done refill starting tomorrow for 20 more roxicodone. Encourage to take tylenol. No NSAIDs due to the risk of ulcer from gastrojejunostomy.  Is eating and having Bms.  Will see on Tuesday for staple removal.  Curlene Labrum, MD [facility] [address] [place], [place] [address] [phone] (office)

## 2019-04-12 ENCOUNTER — Encounter (HOSPITAL_COMMUNITY): Payer: Self-pay | Admitting: Emergency Medicine

## 2019-04-12 ENCOUNTER — Inpatient Hospital Stay (HOSPITAL_COMMUNITY)
Admission: EM | Admit: 2019-04-12 | Discharge: 2019-04-18 | DRG: 326 | Disposition: A | Discharge status: Home / Self Care | Payer: Self-pay | Attending: General Surgery | Admitting: General Surgery

## 2019-04-12 ENCOUNTER — Other Ambulatory Visit: Payer: Self-pay

## 2019-04-12 DIAGNOSIS — K859 Acute pancreatitis without necrosis or infection, unspecified: Secondary | ICD-10-CM | POA: Diagnosis present

## 2019-04-12 DIAGNOSIS — K631 Perforation of intestine (nontraumatic): Secondary | ICD-10-CM | POA: Diagnosis present

## 2019-04-12 DIAGNOSIS — Z79899 Other long term (current) drug therapy: Secondary | ICD-10-CM

## 2019-04-12 DIAGNOSIS — F1010 Alcohol abuse, uncomplicated: Secondary | ICD-10-CM | POA: Diagnosis present

## 2019-04-12 DIAGNOSIS — W1830XA Fall on same level, unspecified, initial encounter: Secondary | ICD-10-CM | POA: Diagnosis present

## 2019-04-12 DIAGNOSIS — R109 Unspecified abdominal pain: Secondary | ICD-10-CM

## 2019-04-12 DIAGNOSIS — Z20828 Contact with and (suspected) exposure to other viral communicable diseases: Secondary | ICD-10-CM | POA: Diagnosis present

## 2019-04-12 DIAGNOSIS — E878 Other disorders of electrolyte and fluid balance, not elsewhere classified: Secondary | ICD-10-CM | POA: Diagnosis present

## 2019-04-12 DIAGNOSIS — D72829 Elevated white blood cell count, unspecified: Secondary | ICD-10-CM | POA: Diagnosis present

## 2019-04-12 DIAGNOSIS — Z720 Tobacco use: Secondary | ICD-10-CM | POA: Diagnosis present

## 2019-04-12 DIAGNOSIS — Z4659 Encounter for fitting and adjustment of other gastrointestinal appliance and device: Secondary | ICD-10-CM

## 2019-04-12 DIAGNOSIS — K659 Peritonitis, unspecified: Secondary | ICD-10-CM | POA: Diagnosis present

## 2019-04-12 DIAGNOSIS — E873 Alkalosis: Secondary | ICD-10-CM | POA: Diagnosis present

## 2019-04-12 DIAGNOSIS — K311 Adult hypertrophic pyloric stenosis: Secondary | ICD-10-CM | POA: Diagnosis present

## 2019-04-12 DIAGNOSIS — Z9889 Other specified postprocedural states: Secondary | ICD-10-CM

## 2019-04-12 DIAGNOSIS — K2920 Alcoholic gastritis without bleeding: Secondary | ICD-10-CM | POA: Diagnosis present

## 2019-04-12 DIAGNOSIS — D62 Acute posthemorrhagic anemia: Secondary | ICD-10-CM | POA: Diagnosis present

## 2019-04-12 DIAGNOSIS — F17210 Nicotine dependence, cigarettes, uncomplicated: Secondary | ICD-10-CM | POA: Diagnosis present

## 2019-04-12 DIAGNOSIS — K863 Pseudocyst of pancreas: Secondary | ICD-10-CM | POA: Diagnosis present

## 2019-04-12 DIAGNOSIS — R1013 Epigastric pain: Secondary | ICD-10-CM | POA: Diagnosis present

## 2019-04-12 DIAGNOSIS — R066 Hiccough: Secondary | ICD-10-CM | POA: Diagnosis present

## 2019-04-12 DIAGNOSIS — Z79891 Long term (current) use of opiate analgesic: Secondary | ICD-10-CM

## 2019-04-12 DIAGNOSIS — K56609 Unspecified intestinal obstruction, unspecified as to partial versus complete obstruction: Principal | ICD-10-CM | POA: Diagnosis present

## 2019-04-12 DIAGNOSIS — F1020 Alcohol dependence, uncomplicated: Secondary | ICD-10-CM | POA: Diagnosis present

## 2019-04-12 DIAGNOSIS — K860 Alcohol-induced chronic pancreatitis: Secondary | ICD-10-CM | POA: Diagnosis present

## 2019-04-12 DIAGNOSIS — E876 Hypokalemia: Secondary | ICD-10-CM | POA: Diagnosis present

## 2019-04-12 DIAGNOSIS — K862 Cyst of pancreas: Secondary | ICD-10-CM | POA: Diagnosis present

## 2019-04-12 DIAGNOSIS — K219 Gastro-esophageal reflux disease without esophagitis: Secondary | ICD-10-CM | POA: Diagnosis present

## 2019-04-12 DIAGNOSIS — I10 Essential (primary) hypertension: Secondary | ICD-10-CM | POA: Diagnosis present

## 2019-04-12 NOTE — ED Triage Notes (Signed)
Pt from home via [facility]. Pt had recent abdominal surgery. Pt reports falling yesterday and injuring the surgical area. No wound dehiscence noticed and no drainage from the site.

## 2019-04-13 ENCOUNTER — Inpatient Hospital Stay: Payer: Self-pay

## 2019-04-13 ENCOUNTER — Inpatient Hospital Stay (HOSPITAL_COMMUNITY): Payer: Self-pay

## 2019-04-13 ENCOUNTER — Emergency Department (HOSPITAL_COMMUNITY): Payer: Self-pay

## 2019-04-13 DIAGNOSIS — K659 Peritonitis, unspecified: Secondary | ICD-10-CM | POA: Diagnosis present

## 2019-04-13 DIAGNOSIS — R1013 Epigastric pain: Secondary | ICD-10-CM

## 2019-04-13 LAB — CBC WITH DIFFERENTIAL/PLATELET
Abs Immature Granulocytes: 0.17 10*3/uL — ABNORMAL HIGH (ref 0.00–0.07)
Basophils Absolute: 0 10*3/uL (ref 0.0–0.1)
Basophils Relative: 0 %
Eosinophils Absolute: 0.2 10*3/uL (ref 0.0–0.5)
Eosinophils Relative: 1 %
HCT: 32.6 % — ABNORMAL LOW (ref 39.0–52.0)
Hemoglobin: 10.2 g/dL — ABNORMAL LOW (ref 13.0–17.0)
Immature Granulocytes: 1 %
Lymphocytes Relative: 15 %
Lymphs Abs: 2.6 10*3/uL (ref 0.7–4.0)
MCH: 22.7 pg — ABNORMAL LOW (ref 26.0–34.0)
MCHC: 31.3 g/dL (ref 30.0–36.0)
MCV: 72.4 fL — ABNORMAL LOW (ref 80.0–100.0)
Monocytes Absolute: 1.2 10*3/uL — ABNORMAL HIGH (ref 0.1–1.0)
Monocytes Relative: 7 %
Neutro Abs: 12.6 10*3/uL — ABNORMAL HIGH (ref 1.7–7.7)
Neutrophils Relative %: 76 %
Platelets: 836 10*3/uL — ABNORMAL HIGH (ref 150–400)
RBC: 4.5 MIL/uL (ref 4.22–5.81)
RDW: 22 % — ABNORMAL HIGH (ref 11.5–15.5)
WBC: 16.7 10*3/uL — ABNORMAL HIGH (ref 4.0–10.5)
nRBC: 0 % (ref 0.0–0.2)

## 2019-04-13 LAB — MRSA PCR SCREENING: MRSA by PCR: NEGATIVE

## 2019-04-13 LAB — URINALYSIS, ROUTINE W REFLEX MICROSCOPIC
Bilirubin Urine: NEGATIVE
Glucose, UA: NEGATIVE mg/dL
Hgb urine dipstick: NEGATIVE
Ketones, ur: NEGATIVE mg/dL
Leukocytes,Ua: NEGATIVE
Nitrite: NEGATIVE
Protein, ur: NEGATIVE mg/dL
Specific Gravity, Urine: 1.035 — ABNORMAL HIGH (ref 1.005–1.030)
pH: 5 (ref 5.0–8.0)

## 2019-04-13 LAB — COMPREHENSIVE METABOLIC PANEL
ALT: 12 U/L (ref 0–44)
AST: 15 U/L (ref 15–41)
Albumin: 4.3 g/dL (ref 3.5–5.0)
Alkaline Phosphatase: 74 U/L (ref 38–126)
Anion gap: 16 — ABNORMAL HIGH (ref 5–15)
BUN: 18 mg/dL (ref 6–20)
CO2: 35 mmol/L — ABNORMAL HIGH (ref 22–32)
Calcium: 10.1 mg/dL (ref 8.9–10.3)
Chloride: 83 mmol/L — ABNORMAL LOW (ref 98–111)
Creatinine, Ser: 1.05 mg/dL (ref 0.61–1.24)
GFR calc Af Amer: >60 mL/min (ref >60)
GFR calc non Af Amer: >60 mL/min (ref >60)
Glucose, Bld: 147 mg/dL — ABNORMAL HIGH (ref 70–99)
Potassium: 2.8 mmol/L — ABNORMAL LOW (ref 3.5–5.1)
Sodium: 134 mmol/L — ABNORMAL LOW (ref 135–145)
Total Bilirubin: 0.3 mg/dL (ref 0.3–1.2)
Total Protein: 8.7 g/dL — ABNORMAL HIGH (ref 6.5–8.1)

## 2019-04-13 LAB — RAPID URINE DRUG SCREEN, HOSP PERFORMED
Amphetamines: NOT DETECTED
Barbiturates: NOT DETECTED
Benzodiazepines: POSITIVE — AB
Cocaine: NOT DETECTED
Opiates: POSITIVE — AB
Tetrahydrocannabinol: NOT DETECTED

## 2019-04-13 LAB — SARS CORONAVIRUS 2 BY RT PCR (HOSPITAL ORDER, PERFORMED IN ~~LOC~~ HOSPITAL LAB): SARS Coronavirus 2: NEGATIVE

## 2019-04-13 LAB — LACTIC ACID, PLASMA
Lactic Acid, Venous: 1.9 mmol/L (ref 0.5–1.9)
Lactic Acid, Venous: 1.1 mmol/L (ref 0.5–1.9)

## 2019-04-13 LAB — LIPASE, BLOOD: Lipase: 57 U/L — ABNORMAL HIGH (ref 11–51)

## 2019-04-13 LAB — ETHANOL: Alcohol, Ethyl (B): <10 mg/dL (ref <10)

## 2019-04-13 LAB — MAGNESIUM: Magnesium: 2 mg/dL (ref 1.7–2.4)

## 2019-04-13 MED ORDER — NICOTINE 14 MG/24HR TD PT24
14.0000 mg | MEDICATED_PATCH | Freq: Every day | TRANSDERMAL | Status: DC
  Administered 2019-04-13 – 2019-04-18 (×6): 14 mg via TRANSDERMAL
  Filled 2019-04-13 (×6): qty 1

## 2019-04-13 MED ORDER — ONDANSETRON HCL 4 MG/2ML IJ SOLN
4.0000 mg | Freq: Once | INTRAMUSCULAR | Status: AC
  Administered 2019-04-13: 02:00:00 4 mg via INTRAVENOUS
  Filled 2019-04-13: qty 2

## 2019-04-13 MED ORDER — PANTOPRAZOLE SODIUM 40 MG IV SOLR
40.0000 mg | Freq: Two times a day (BID) | INTRAVENOUS | Status: DC
  Administered 2019-04-13 – 2019-04-18 (×11): 40 mg via INTRAVENOUS
  Filled 2019-04-13 (×11): qty 40

## 2019-04-13 MED ORDER — ACETAMINOPHEN 650 MG RE SUPP: 650.0000 mg | Freq: Four times a day (QID) | RECTAL | Status: DC | PRN

## 2019-04-13 MED ORDER — ACETAMINOPHEN 325 MG PO TABS: 650.0000 mg | ORAL_TABLET | Freq: Four times a day (QID) | ORAL | Status: DC | PRN

## 2019-04-13 MED ORDER — PANTOPRAZOLE SODIUM 40 MG IV SOLR: 40.0000 mg | INTRAVENOUS | Status: DC

## 2019-04-13 MED ORDER — LORAZEPAM 2 MG/ML IJ SOLN: 0.0000 mg | Freq: Four times a day (QID) | INTRAMUSCULAR | Status: DC

## 2019-04-13 MED ORDER — HYDROMORPHONE HCL 1 MG/ML IJ SOLN: 1.0000 mg | INTRAMUSCULAR | Status: DC | PRN

## 2019-04-13 MED ORDER — SODIUM CHLORIDE 0.9 % IV BOLUS
1000.0000 mL | Freq: Once | INTRAVENOUS | Status: AC
  Administered 2019-04-13: 1000 mL via INTRAVENOUS

## 2019-04-13 MED ORDER — IOHEXOL 9 MG/ML PO SOLN: 500.0000 mL | ORAL | Status: DC

## 2019-04-13 MED ORDER — PIPERACILLIN-TAZOBACTAM 3.375 G IVPB 30 MIN
3.3750 g | Freq: Once | INTRAVENOUS | Status: AC
  Administered 2019-04-13: 3.375 g via INTRAVENOUS
  Filled 2019-04-13: qty 50

## 2019-04-13 MED ORDER — LORAZEPAM 2 MG/ML IJ SOLN
2.0000 mg | INTRAMUSCULAR | Status: DC | PRN
  Administered 2019-04-13 – 2019-04-17 (×9): 2 mg via INTRAVENOUS
  Filled 2019-04-13 (×9): qty 1

## 2019-04-13 MED ORDER — SODIUM CHLORIDE 0.9 % IV SOLN
INTRAVENOUS | Status: DC
  Administered 2019-04-13 – 2019-04-15 (×5): via INTRAVENOUS

## 2019-04-13 MED ORDER — PIPERACILLIN-TAZOBACTAM 3.375 G IVPB: 3.3750 g | Freq: Three times a day (TID) | INTRAVENOUS | Status: DC

## 2019-04-13 MED ORDER — PIPERACILLIN-TAZOBACTAM 3.375 G IVPB
3.3750 g | Freq: Three times a day (TID) | INTRAVENOUS | Status: DC
  Administered 2019-04-13 – 2019-04-17 (×11): 3.375 g via INTRAVENOUS
  Filled 2019-04-13 (×11): qty 50

## 2019-04-13 MED ORDER — IOHEXOL 300 MG/ML  SOLN
100.0000 mL | Freq: Once | INTRAMUSCULAR | Status: AC | PRN
  Administered 2019-04-13: 100 mL via INTRAVENOUS

## 2019-04-13 MED ORDER — POTASSIUM CHLORIDE 10 MEQ/100ML IV SOLN
10.0000 meq | INTRAVENOUS | Status: AC
  Administered 2019-04-13 – 2019-04-14 (×5): 10 meq via INTRAVENOUS
  Filled 2019-04-13 (×5): qty 100

## 2019-04-13 MED ORDER — SODIUM CHLORIDE 0.9% FLUSH: 3.0000 mL | Freq: Two times a day (BID) | INTRAVENOUS | Status: DC

## 2019-04-13 MED ORDER — LACTATED RINGERS IV SOLN
INTRAVENOUS | Status: DC
  Administered 2019-04-13: 12:00:00 via INTRAVENOUS

## 2019-04-13 MED ORDER — CHLORHEXIDINE GLUCONATE CLOTH 2 % EX PADS
6.0000 | MEDICATED_PAD | Freq: Every day | CUTANEOUS | Status: DC
  Administered 2019-04-13 – 2019-04-14 (×2): 6 via TOPICAL

## 2019-04-13 MED ORDER — ONDANSETRON 4 MG PO TBDP
4.0000 mg | ORAL_TABLET | Freq: Four times a day (QID) | ORAL | Status: DC | PRN
  Filled 2019-04-13: qty 1

## 2019-04-13 MED ORDER — SODIUM CHLORIDE 0.9 % IV BOLUS
500.0000 mL | Freq: Once | INTRAVENOUS | Status: AC
  Administered 2019-04-13: 500 mL via INTRAVENOUS

## 2019-04-13 MED ORDER — HYDROMORPHONE HCL 1 MG/ML IJ SOLN
0.5000 mg | Freq: Once | INTRAMUSCULAR | Status: AC
  Administered 2019-04-13: 0.5 mg via INTRAVENOUS
  Filled 2019-04-13: qty 1

## 2019-04-13 MED ORDER — IOHEXOL 9 MG/ML PO SOLN
ORAL | Status: AC
  Filled 2019-04-13: qty 1000

## 2019-04-13 MED ORDER — LACTATED RINGERS IV SOLN: INTRAVENOUS | Status: DC

## 2019-04-13 MED ORDER — ENOXAPARIN SODIUM 40 MG/0.4ML ~~LOC~~ SOLN
40.0000 mg | SUBCUTANEOUS | Status: DC
  Filled 2019-04-13 (×2): qty 0.4

## 2019-04-13 MED ORDER — POTASSIUM CHLORIDE 10 MEQ/100ML IV SOLN
INTRAVENOUS | Status: AC
  Administered 2019-04-13: 10 meq via INTRAVENOUS
  Filled 2019-04-13: qty 100

## 2019-04-13 MED ORDER — ALUM & MAG HYDROXIDE-SIMETH 200-200-20 MG/5ML PO SUSP
30.0000 mL | Freq: Four times a day (QID) | ORAL | Status: DC | PRN
  Administered 2019-04-13: 30 mL via ORAL
  Filled 2019-04-13: qty 30

## 2019-04-13 MED ORDER — POTASSIUM CHLORIDE 10 MEQ/100ML IV SOLN
10.0000 meq | INTRAVENOUS | Status: AC
  Administered 2019-04-13 (×4): 10 meq via INTRAVENOUS
  Filled 2019-04-13 (×3): qty 100

## 2019-04-13 MED ORDER — ONDANSETRON HCL 4 MG PO TABS: 4.0000 mg | ORAL_TABLET | Freq: Four times a day (QID) | ORAL | Status: DC | PRN

## 2019-04-13 MED ORDER — HYDROMORPHONE HCL 1 MG/ML IJ SOLN
0.5000 mg | INTRAMUSCULAR | Status: DC | PRN
  Administered 2019-04-13 – 2019-04-17 (×7): 0.5 mg via INTRAVENOUS
  Filled 2019-04-13 (×8): qty 0.5

## 2019-04-13 MED ORDER — SODIUM CHLORIDE 0.9% FLUSH: 3.0000 mL | INTRAVENOUS | Status: DC | PRN

## 2019-04-13 MED ORDER — ONDANSETRON HCL 4 MG/2ML IJ SOLN: 4.0000 mg | Freq: Four times a day (QID) | INTRAMUSCULAR | Status: DC | PRN

## 2019-04-13 MED ORDER — HYDROMORPHONE HCL 1 MG/ML IJ SOLN
1.0000 mg | INTRAMUSCULAR | Status: DC | PRN
  Administered 2019-04-13 – 2019-04-17 (×21): 1 mg via INTRAVENOUS
  Filled 2019-04-13 (×21): qty 1

## 2019-04-13 MED ORDER — LORAZEPAM 2 MG/ML IJ SOLN
0.0000 mg | Freq: Two times a day (BID) | INTRAMUSCULAR | Status: DC
  Filled 2019-04-13: qty 2

## 2019-04-13 MED ORDER — SODIUM CHLORIDE 0.9 % IV SOLN: 250.0000 mL | INTRAVENOUS | Status: DC | PRN

## 2019-04-13 MED ORDER — ADULT MULTIVITAMIN W/MINERALS CH
1.0000 | ORAL_TABLET | Freq: Every day | ORAL | Status: DC
  Administered 2019-04-13: 1 via ORAL
  Filled 2019-04-13: qty 1

## 2019-04-13 MED ORDER — HYDROMORPHONE HCL 1 MG/ML IJ SOLN
1.0000 mg | Freq: Once | INTRAMUSCULAR | Status: AC
  Administered 2019-04-13: 1 mg via INTRAVENOUS
  Filled 2019-04-13: qty 1

## 2019-04-13 MED ORDER — THIAMINE HCL 100 MG/ML IJ SOLN
100.0000 mg | Freq: Every day | INTRAMUSCULAR | Status: DC
  Administered 2019-04-13 – 2019-04-18 (×6): 100 mg via INTRAVENOUS
  Filled 2019-04-13 (×6): qty 2

## 2019-04-13 MED ORDER — ONDANSETRON HCL 4 MG/2ML IJ SOLN
4.0000 mg | Freq: Four times a day (QID) | INTRAMUSCULAR | Status: DC | PRN
  Administered 2019-04-13 – 2019-04-18 (×9): 4 mg via INTRAVENOUS
  Filled 2019-04-13 (×8): qty 2

## 2019-04-13 NOTE — Progress Notes (Signed)
Spoke with Dr. Arnoldo Morale regarding his request to have a PICC line placed in order to start TPN in the morning. Advised him that per day shift RN, the order was placed and should be able to have PICC in the morning. Dr. Arnoldo Morale is very adamant that PICC be placed and that he specifically requested it be placed today. [facility] vascular team called and advised they will not be able to place line until tomorrow. This RN called [facility] who advised they were 95% sure they couldn't place the line until tomorrow and she would call me should something change. Paged Dr. Arnoldo Morale with an update.    **Pt was also just caught smoking in his room. Cigarettes have been removed and security called.**

## 2019-04-13 NOTE — ED Notes (Signed)
Dr. Arnoldo Morale tried to persuade the patient to allow staff to insert an NG tube but patient is refusing the tube placement. PT made aware of risks of not getting the NG tube.

## 2019-04-13 NOTE — ED Notes (Signed)
Pt refusing NG tube at this time. Pt states "I need to be asleep for that shit it hurts too damn bad." Pt explained the risks of not having the NG tube placement. Pt verbalized understanding.

## 2019-04-13 NOTE — ED Provider Notes (Addendum)
[facility] EMERGENCY DEPARTMENT Provider Note   CSN: [id number] Arrival date & time: 04/12/19  2310   Time seen 12:15 AM  History   Chief Complaint Chief Complaint  Patient presents with   Abdominal Pain    HPI David Miranda is a 33 y.o. male.     HPI patient has a history of alcoholism, chronic pancreatitis and alcoholic gastritis/esophagitis who recently had a gastrojejunostomy done on October 30 due to outlet obstruction.  Patient story is hard to pin down because he changes it.  He initially told me he fell 2 days ago, he then told me he fell last night.  He states he got dizzy around 6:30 PM and he fell and his face hit a table and chipped his 2 upper middle incisors and when he woke up he was laying on his abdomen.  He reports increasing abdominal pain since that time.  He states he has had nausea and vomiting that started only after he got to the ED but then he tells me that when he eats at home it causes increasing pain and vomiting since he had a surgery.  He states the pain has been constant since he fell but it waxes and wanes.  He states it is sharp.  He states it is worse with hiccuping and he is having lots of hiccuping.  He denies any fever, melena, or blood in his vomitus or bowel movements.  He complains of having restless arms and legs but denies any feeling of things crawling on them or having hallucinations.  He states he was drinking over a pint a day prior to his last hospitalization.  When I ask he is not sure if he is going through withdrawal.  He also complains of his back muscles hurting and that his shoulder muscles hurting.  Patient is rolling around on the stretcher.  He says he has enough pain pills to last through today but then he will run out early.  PCP Patient, No Pcp Per   Past Medical History:  Diagnosis Date   GERD (gastroesophageal reflux disease)    Pancreatitis     Patient Active Problem List   Diagnosis Date Noted   Peritonitis ([place])  04/13/2019   Gastric outlet obstruction    Abdominal pain    GI bleed 03/31/2019   Emesis, persistent 03/30/2019   Esophagitis 03/30/2019   Pancreatic pseudocyst/cyst--alcoholic pancreatitis [id number]   Acute necrotizing pancreatitis 07/24/2018   Cannabis abuse 03/27/2018   Acute kidney injury ([place])    Hypomagnesemia    Acute on chronic pancreatitis ([place]) 03/26/2018   GERD (gastroesophageal reflux disease) 03/26/2018   Acute renal failure (ARF) ([place]) 03/26/2018   Hypokalemia 02/27/2018   Essential hypertension 02/27/2018   Tobacco abuse 02/27/2018   Acute pancreatitis 02/27/2018   Constipation    Annular pancreas    Pancreatitis    Non-intractable vomiting with nausea    Hepatic steatosis    Necrotizing pancreatitis 12/27/2017   Leukocytosis 12/27/2017   Abdominal pain, epigastric [id number]   Acute alcoholic pancreatitis [id number]   Alcohol abuse 12/26/2017    Past Surgical History:  Procedure Laterality Date   ESOPHAGOGASTRODUODENOSCOPY (EGD) WITH PROPOFOL N/A 04/01/2019   Procedure: ESOPHAGOGASTRODUODENOSCOPY (EGD) WITH PROPOFOL;  Surgeon: Danie Binder, MD;  Location: [facility] ENDO SUITE;  Service: Endoscopy;  Laterality: N/A;   GASTROJEJUNOSTOMY N/A 04/04/2019   Procedure: GASTROJEJUNOSTOMY;  Surgeon: Virl Cagey, MD;  Location: [facility] ORS;  Service: General;  Laterality: N/A;   NO PAST SURGERIES  Home Medications    Prior to Admission medications   Medication Sig Start Date End Date Taking? Authorizing Provider  acetaminophen (TYLENOL) 325 MG tablet Take 2 tablets (650 mg total) by mouth every 4 (four) hours as needed for mild pain, fever or headache. 04/08/19 04/07/20  Roxan Hockey, MD  lipase/protease/amylase (CREON) 36000 UNITS CPEP capsule Take 2 capsules (72,000 Units total) by mouth 3 (three) times daily with meals. 04/08/19   Roxan Hockey, MD  Multiple Vitamin (MULTIVITAMIN WITH MINERALS) TABS tablet Take 1  tablet by mouth daily. 04/08/19   Roxan Hockey, MD  nicotine (NICODERM CQ - DOSED IN MG/24 HOURS) 14 mg/24hr patch Place 1 patch (14 mg total) onto the skin daily. 04/08/19 05/08/19  Roxan Hockey, MD  ondansetron (ZOFRAN) 4 MG tablet Take 1 tablet (4 mg total) by mouth every 6 (six) hours as needed for nausea. 04/08/19   Virl Cagey, MD  oxyCODONE (OXY IR/ROXICODONE) 5 MG immediate release tablet Take 1 tablet (5 mg total) by mouth every 4 (four) hours as needed for up to 7 days for severe pain or breakthrough pain. 04/11/19 04/18/19  Virl Cagey, MD  pantoprazole (PROTONIX) 40 MG tablet Take 1 tablet (40 mg total) by mouth 2 (two) times daily. 04/08/19   Virl Cagey, MD  ranitidine (ZANTAC) 300 MG tablet Take 1 tablet (300 mg total) by mouth at bedtime. 04/08/19 04/07/20  Roxan Hockey, MD  senna-docusate (SENOKOT-S) 8.6-50 MG tablet Take 2 tablets by mouth at bedtime. 04/08/19 06/07/19  Roxan Hockey, MD  traZODone (DESYREL) 100 MG tablet Take 1 tablet (100 mg total) by mouth at bedtime. 04/08/19   Roxan Hockey, MD    Family History Family History  Problem Relation Age of Onset   Cancer Other     Social History Social History   Tobacco Use   Smoking status: Current Every Day Smoker    Packs/day: 1.00    Types: Cigarettes   Smokeless tobacco: Never Used  Substance Use Topics   Alcohol use: Yes    Comment: pint every few days   Drug use: Not Currently    Types: Marijuana    Comment: occasionally     Allergies   Patient has no known allergies.   Review of Systems Review of Systems  All other systems reviewed and are negative.    Physical Exam Updated Vital Signs BP 111/88 (BP Location: Right Arm)    Pulse (!) 108    Temp 98.1 F (36.7 C) (Oral)    Resp 17    SpO2 100%   Physical Exam Vitals signs and nursing note reviewed.  Constitutional:      General: He is in acute distress.     Appearance: Normal appearance.     Comments: Patient  is rolling around on the stretcher  HENT:     Head: Normocephalic and atraumatic.     Right Ear: External ear normal.     Left Ear: External ear normal.     Nose: Nose normal.     Mouth/Throat:     Mouth: Mucous membranes are moist.     Comments: Patient is noted to have loss of the distal portion of both upper middle incisors which he states is new Eyes:     Extraocular Movements: Extraocular movements intact.     Conjunctiva/sclera: Conjunctivae normal.     Pupils: Pupils are equal, round, and reactive to light.  Neck:     Musculoskeletal: Normal range of motion.  Cardiovascular:  Rate and Rhythm: Regular rhythm. Tachycardia present.  Pulmonary:     Effort: No respiratory distress.     Breath sounds: Normal breath sounds.  Abdominal:     General: Abdomen is flat. Bowel sounds are increased.     Palpations: Abdomen is soft.     Tenderness: There is generalized abdominal tenderness.     Comments: Patient has a well-healed upper midline surgical scar with staples still in place.  There is no redness around the area.  He is tender diffusely.  I did not witness any hiccuping during my exam.  Musculoskeletal: Normal range of motion.        General: No swelling.  Skin:    General: Skin is warm and dry.     Coloration: Skin is pale.  Neurological:     General: No focal deficit present.     Mental Status: He is alert and oriented to person, place, and time.     Cranial Nerves: No cranial nerve deficit.  Psychiatric:        Mood and Affect: Affect is labile.        Speech: Speech is rapid and pressured.        Behavior: Behavior is agitated.      ED Treatments / Results  Labs (all labs ordered are listed, but only abnormal results are displayed) Results for orders placed or performed during the hospital encounter of 04/12/19  Comprehensive metabolic panel  Result Value Ref Range   Sodium 134 (L) 135 - 145 mmol/L   Potassium 2.8 (L) 3.5 - 5.1 mmol/L   Chloride 83 (L) 98 -  111 mmol/L   CO2 35 (H) 22 - 32 mmol/L   Glucose, Bld 147 (H) 70 - 99 mg/dL   BUN 18 6 - 20 mg/dL   Creatinine, Ser 1.05 0.61 - 1.24 mg/dL   Calcium 10.1 8.9 - 10.3 mg/dL   Total Protein 8.7 (H) 6.5 - 8.1 g/dL   Albumin 4.3 3.5 - 5.0 g/dL   AST 15 15 - 41 U/L   ALT 12 0 - 44 U/L   Alkaline Phosphatase 74 38 - 126 U/L   Total Bilirubin 0.3 0.3 - 1.2 mg/dL   GFR calc non Af Amer >60 >60 mL/min   GFR calc Af Amer >60 >60 mL/min   Anion gap 16 (H) 5 - 15  Lipase, blood  Result Value Ref Range   Lipase 57 (H) 11 - 51 U/L  CBC with Differential  Result Value Ref Range   WBC 16.7 (H) 4.0 - 10.5 K/uL   RBC 4.50 4.22 - 5.81 MIL/uL   Hemoglobin 10.2 (L) 13.0 - 17.0 g/dL   HCT 32.6 (L) 39.0 - 52.0 %   MCV 72.4 (L) 80.0 - 100.0 fL   MCH 22.7 (L) 26.0 - 34.0 pg   MCHC 31.3 30.0 - 36.0 g/dL   RDW 22.0 (H) 11.5 - 15.5 %   Platelets 836 (H) 150 - 400 K/uL   nRBC 0.0 0.0 - 0.2 %   Neutrophils Relative % 76 %   Neutro Abs 12.6 (H) 1.7 - 7.7 K/uL   Lymphocytes Relative 15 %   Lymphs Abs 2.6 0.7 - 4.0 K/uL   Monocytes Relative 7 %   Monocytes Absolute 1.2 (H) 0.1 - 1.0 K/uL   Eosinophils Relative 1 %   Eosinophils Absolute 0.2 0.0 - 0.5 K/uL   Basophils Relative 0 %   Basophils Absolute 0.0 0.0 - 0.1 K/uL   Immature Granulocytes  1 %   Abs Immature Granulocytes 0.17 (H) 0.00 - 0.07 K/uL  Ethanol  Result Value Ref Range   Alcohol, Ethyl (B) <10 <10 mg/dL  Magnesium  Result Value Ref Range   Magnesium 2.0 1.7 - 2.4 mg/dL  Lactic acid, plasma  Result Value Ref Range   Lactic Acid, Venous 1.9 0.5 - 1.9 mmol/L   Laboratory interpretation all normal except markedly elevated bicarb consistent with dehydration, low potassium, chloride and sodium consistent with dehydration, mildly elevated anion gap, mildly elevated lipase, leukocytosis, anemia but improved from what he was added in the hospital    EKG EKG Interpretation  Date/Time:  Sunday April 13 2019 01:45:59 EST Ventricular  Rate:  99 PR Interval:    QRS Duration: 87 QT Interval:  404 QTC Calculation: 519 R Axis:   77 Text Interpretation: Sinus rhythm Probable left atrial enlargement Prolonged QT interval No significant change since last tracing 30 Mar 2019 Confirmed by Rolland Porter [phone]) on 04/13/2019 3:58:40 AM   Radiology Ct Abdomen Pelvis W Contrast  Result Date: 04/13/2019 CLINICAL DATA:  Eighty-a of endo contact cough radiology arm hallucis con about this [facility] CT on this CT EXAM: CT ABDOMEN AND PELVIS WITH CONTRAST TECHNIQUE: Multidetector CT imaging of the abdomen and pelvis was performed using the standard protocol following bolus administration of intravenous contrast. CONTRAST:  145mL OMNIPAQUE IOHEXOL 300 MG/ML  SOLN COMPARISON:  Prior CT from 03/18/2019. FINDINGS: Lower chest: Mild hazy subsegmental atelectatic changes seen dependently within the visualized lung bases. Visualized lungs are otherwise clear. Hepatobiliary: Liver demonstrates a normal contrast enhanced appearance. Gallbladder within normal limits. No biliary dilatation. Pancreas: 18 mm pseudocyst at the pancreatic body again noted, stable. No acute peripancreatic inflammation or abnormal pancreatic ductal dilatation. Spleen: Spleen within normal limits. Adrenals/Urinary Tract: Adrenal glands are normal. Kidneys equal in size with symmetric enhancement. 15 mm simple cyst present within the interpolar left kidney. No nephrolithiasis, hydronephrosis, or focal enhancing renal mass. No hydroureter. Partially distended bladder within normal limits. Stomach/Bowel: Prominent circumferential wall thickening seen about the visualized esophagus, improved from previous. Moderate gastric distension with prominent fluid within the gastric lumen, also improved relative to preoperative exam. Postoperative changes from interval gastrojejunostomy for gastric outlet obstruction. Multiple prominent and dilated loops of small bowel seen throughout the mid and left  abdomen, measuring up to 3.8 cm in diameter. Associated internal air-fluid levels. There is a suspected transition point within the left abdomen, although exact transition point somewhat difficult to localize. There is moderate to large volume free intraperitoneal air within the abdomen, felt to be greater than normal expected postoperative changes, concerning for perforated viscus. There is focal pneumatosis seen about a short segment loop of bowel within the left abdomen, which could reflect slight of perforation (series 5, image 50). No portal venous gas. Ileum is decompressed distally. Appendix within normal limits. Colon is diffusely decompressed with scattered retained barium contrast within the colonic lumen. Vascular/Lymphatic: Normal intravascular enhancement seen throughout the intra-abdominal aorta. Mesenteric vessels patent proximally. No adenopathy. Reproductive: Prostate within normal limits. Other: Moderate volume free fluid within the pelvis, likely reactive. Musculoskeletal: No acute osseous abnormality. No discrete lytic or blastic osseous lesions. IMPRESSION: 1. Postoperative changes from recent gastrojejunostomy for gastric outlet obstruction, with multiple dilated loops of small bowel within the left and mid abdomen, concerning for small bowel obstruction. Large volume free air within the abdomen, greater than expected for normal postoperative changes, concerning for perforated viscus. Exact location of perforation is not definitely identified,  however, the dilated small bowel is suspected to be the likely source of free air. Emergent surgical consultation recommended. 2. Persistent but improved gastric distension status post gastrojejunostomy. 3. Persistent but improved distal esophageal wall thickening, which could reflect acute esophagitis and/or changes related to reflux or vomiting. 4. Unchanged 1.8 cm cystic lesion within the pancreas, likely pseudocyst. Follow-up recommendations as  previously described. Critical Value/emergent results were called by telephone at the time of interpretation on 04/13/2019 at 4:40 am to [place] , who verbally acknowledged these results. Electronically Signed   By: Jeannine Boga M.D.   On: 04/13/2019 05:07   Dg Abd 2 Views  Result Date: 04/13/2019 CLINICAL DATA:  Recent surgery with increasing abdominal pain and bloating. EXAM: ABDOMEN - 2 VIEW COMPARISON:  04/02/2019 FINDINGS: There are scattered loops of small bowel scattered throughout the abdomen measuring up to approximately 5.5 cm in diameter. Air-fluid levels are noted. There is a moderate volume of pneumoperitoneum. Skin staples project over the patient's midline abdomen. IMPRESSION: 1. Moderate to high-grade small bowel obstruction. 2. Moderate volume of pneumoperitoneum. Correlation for timing of the patient's recent surgery is recommended. If there is clinical concern for perforation, follow-up with CT is recommended. Electronically Signed   By: Constance Holster M.D.   On: 04/13/2019 01:31    Procedures .Critical Care Performed by: Rolland Porter, MD Authorized by: Rolland Porter, MD   Critical care provider statement:    Critical care time (minutes):  36   Critical care was necessary to treat or prevent imminent or life-threatening deterioration of the following conditions:  Sepsis and dehydration   Critical care was time spent personally by me on the following activities:  Discussions with consultants, evaluation of patient's response to treatment, examination of patient, obtaining history from patient or surrogate, ordering and review of laboratory studies, ordering and review of radiographic studies, pulse oximetry, re-evaluation of patient's condition and review of old charts   (including critical care time)  Medications Ordered in ED Medications  LORazepam (ATIVAN) injection 0-4 mg (0 mg Intravenous Not Given 04/13/19 0200)  LORazepam (ATIVAN) injection 0-4 mg (has no  administration in time range)  thiamine (B-1) injection 100 mg (has no administration in time range)  iohexol (OMNIPAQUE) 9 MG/ML oral solution 500 mL (has no administration in time range)  potassium chloride 10 mEq in 100 mL IVPB (10 mEq Intravenous New Bag/Given 04/13/19 0511)  sodium chloride 0.9 % bolus 1,000 mL (0 mLs Intravenous Stopped 04/13/19 0314)  sodium chloride 0.9 % bolus 500 mL (0 mLs Intravenous Stopped 04/13/19 0530)  HYDROmorphone (DILAUDID) injection 1 mg (1 mg Intravenous Given 04/13/19 0140)  ondansetron (ZOFRAN) injection 4 mg (4 mg Intravenous Given 04/13/19 0139)  iohexol (OMNIPAQUE) 300 MG/ML solution 100 mL (100 mLs Intravenous Contrast Given 04/13/19 0415)  sodium chloride 0.9 % bolus 1,000 mL (1,000 mLs Intravenous New Bag/Given 04/13/19 0511)  piperacillin-tazobactam (ZOSYN) IVPB 3.375 g (0 g Intravenous Stopped 04/13/19 0522)  HYDROmorphone (DILAUDID) injection 0.5 mg (0.5 mg Intravenous Given 04/13/19 0526)     Initial Impression / Assessment and Plan / ED Course  I have reviewed the triage vital signs and the nursing notes.  Pertinent labs & imaging results that were available during my care of the patient were reviewed by me and considered in my medical decision making (see chart for details).        Patient presents after having a syncopal event yesterday with history of recent surgery on October 30.  He did require  2 units of blood while hospitalized.  Lab work was done.  Patient was given IV fluids.  Laboratory testing was done.  Two-view abdomen was done to evaluate for possible SBO.  I did not want to proceed directly to a CT scan, patient also is moving around too much to do an adequate study at this point.  I am also concerned that he may be having a component of alcohol withdrawal to his symptoms tonight.  CIWA protocol was initiated.  3:00 AM patient was informed that his x-rays look like he may have a bowel blockage and we were going to proceed with CT  scan.  He seems very sedated from the pain medicine he was given.  4:25 AM I looked at his CT result.  He has a very enlarged distended stomach.  He has what appears to be SBO.  He has free air.  NG was ordered.  I also ordered Zosyn for possible perforation with peritonitis.  04:38 AM radiologist called his CT report.  He states that stomach is extremely distended.  He has a small bowel obstruction and he thinks the transition point is probably on the left and he also thinks maybe that is the area where he has perfect.  There is some pneumatosis in the intestinal wall in that area.  4:59 AM Dr. Arnoldo Morale, surgery states to have medicine admit and he will see the patient in about 2 hours.  He agrees with the Zosyn and NG tube.  He requests a rapid Covid be done.  05:15 AM Dr Scherrie November, hospitalist will admit.  5:20 AM nurses report patient is refusing NG tube.  I went in to talk to him and told him his stomach could explode.  He states he will not have it done.  He states they will need to do that when they put him to sleep.  He states he cannot tolerate having it done.   Review of the [place] shows patient got #30 oxycodone 5 mg tablets on November 3, and #20 oxycodone 5 mg tablets on November 6 prescribed by his surgeon, Dr. Constance Haw.   Final Clinical Impressions(s) / ED Diagnoses   Final diagnoses:  Gastric outlet obstruction  SBO (small bowel obstruction) ([place])  Bowel perforation ([place])    Plan admission  Rolland Porter, MD, Barbette Or, MD 04/13/19 Reed Pandy    Rolland Porter, MD 04/13/19 [phone]

## 2019-04-13 NOTE — Progress Notes (Signed)
Patient found in room eating candy that was provided by his mother. Patient is currently NPO. Cigarettes also found on patient. Education provided to patient about diet and no smoking allowed in the hospital. Cigarettes given to patient's mother to take home.

## 2019-04-13 NOTE — Progress Notes (Signed)
Spoke with RN re PICC.  To have CVW for placement tonight per RN.

## 2019-04-13 NOTE — H&P (Signed)
David Miranda is an 33 y.o. male.   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  He states he did have an episode of emesis.  His appetite was decreased.  Patient reports he fell on his abdomen 24 hours earlier and started having significant abdominal pain.  Apparently, he did not have emesis until he presented to the emergency room.  He presented to the emergency room for further evaluation and treatment.  A CT scan of the abdomen was performed which revealed an enlarged stomach that was fluid-filled in nature.  There was pneumoperitoneum present.  No abscess cavity is noted.  There is a question of a small segment of small bowel with pneumatosis present.  There is no focal area of transition or perforation seen.  Past Medical History:  Diagnosis Date  . GERD (gastroesophageal reflux disease)   . Pancreatitis     Past Surgical History:  Procedure Laterality Date  . ESOPHAGOGASTRODUODENOSCOPY (EGD) WITH PROPOFOL N/A 04/01/2019   Procedure: ESOPHAGOGASTRODUODENOSCOPY (EGD) WITH PROPOFOL;  Surgeon: Danie Binder, MD;  Location: [facility];  Service: Endoscopy;  Laterality: N/A;  . GASTROJEJUNOSTOMY N/A 04/04/2019   Procedure: GASTROJEJUNOSTOMY;  Surgeon: Virl Cagey, MD;  Location: [facility];  Service: General;  Laterality: N/A;  . NO PAST SURGERIES      Family History  Problem Relation Age of Onset  . Cancer Other    Social History:  reports that he has been smoking cigarettes. He has been smoking about 1.00 pack per day. He has never used smokeless tobacco. He reports current alcohol use. He reports previous drug use. Drug: Marijuana.  Allergies: No Known Allergies  (Not in a hospital admission)   Results for orders placed or performed during the hospital encounter  of 04/12/19 (from the past 48 hour(s))  Comprehensive metabolic panel     Status: Abnormal   Collection Time: 04/13/19  1:30 AM  Result Value Ref Range   Sodium 134 (L) 135 - 145 mmol/L   Potassium 2.8 (L) 3.5 - 5.1 mmol/L   Chloride 83 (L) 98 - 111 mmol/L   CO2 35 (H) 22 - 32 mmol/L   Glucose, Bld 147 (H) 70 - 99 mg/dL   BUN 18 6 - 20 mg/dL   Creatinine, Ser 1.05 0.61 - 1.24 mg/dL   Calcium 10.1 8.9 - 10.3 mg/dL   Total Protein 8.7 (H) 6.5 - 8.1 g/dL   Albumin 4.3 3.5 - 5.0 g/dL   AST 15 15 - 41 U/L   ALT 12 0 - 44 U/L   Alkaline Phosphatase 74 38 - 126 U/L   Total Bilirubin 0.3 0.3 - 1.2 mg/dL   GFR calc non Af Amer >60 >60 mL/min   GFR calc Af Amer >60 >60 mL/min   Anion gap 16 (H) 5 - 15    Comment: Performed at [facility], [address] [place], [place] [address]  Lipase, blood     Status: Abnormal   Collection Time: 04/13/19  1:30 AM  Result Value Ref Range   Lipase 57 (H) 11 - 51 U/L    Comment: Performed at [facility], [address] [place], [place] [address]  CBC with Differential     Status: Abnormal   Collection Time: 04/13/19  1:30 AM  Result Value Ref Range   WBC 16.7 (H) 4.0 -  10.5 K/uL   RBC 4.50 4.22 - 5.81 MIL/uL   Hemoglobin 10.2 (L) 13.0 - 17.0 g/dL   HCT 32.6 (L) 39.0 - 52.0 %   MCV 72.4 (L) 80.0 - 100.0 fL   MCH 22.7 (L) 26.0 - 34.0 pg   MCHC 31.3 30.0 - 36.0 g/dL   RDW 22.0 (H) 11.5 - 15.5 %   Platelets 836 (H) 150 - 400 K/uL   nRBC 0.0 0.0 - 0.2 %   Neutrophils Relative % 76 %   Neutro Abs 12.6 (H) 1.7 - 7.7 K/uL   Lymphocytes Relative 15 %   Lymphs Abs 2.6 0.7 - 4.0 K/uL   Monocytes Relative 7 %   Monocytes Absolute 1.2 (H) 0.1 - 1.0 K/uL   Eosinophils Relative 1 %   Eosinophils Absolute 0.2 0.0 - 0.5 K/uL   Basophils Relative 0 %   Basophils Absolute 0.0 0.0 - 0.1 K/uL   Immature Granulocytes 1 %   Abs Immature Granulocytes 0.17 (H) 0.00 - 0.07 K/uL    Comment: Performed at [facility], [address], [place], [place] [address]   Magnesium     Status: None   Collection Time: 04/13/19  1:30 AM  Result Value Ref Range   Magnesium 2.0 1.7 - 2.4 mg/dL    Comment: Performed at [facility], [address], [place], [place] [address]  Lactic acid, plasma     Status: None   Collection Time: 04/13/19  1:30 AM  Result Value Ref Range   Lactic Acid, Venous 1.9 0.5 - 1.9 mmol/L    Comment: Performed at [facility], [address], [place], [place] [address]  Ethanol     Status: None   Collection Time: 04/13/19  1:31 AM  Result Value Ref Range   Alcohol, Ethyl (B) <10 <10 mg/dL    Comment: (NOTE) Lowest detectable limit for serum alcohol is 10 mg/dL. For medical purposes only. Performed at [facility], [address], [place], [place] [address]   SARS Coronavirus 2 by RT PCR (hospital order, performed in [facility] hospital lab) Nasopharyngeal Nasopharyngeal Swab     Status: None   Collection Time: 04/13/19  5:32 AM   Specimen: Nasopharyngeal Swab  Result Value Ref Range   SARS Coronavirus 2 NEGATIVE NEGATIVE    Comment: (NOTE) If result is NEGATIVE SARS-CoV-2 target nucleic acids are NOT DETECTED. The SARS-CoV-2 RNA is generally detectable in upper and lower  respiratory specimens during the acute phase of infection. The lowest  concentration of SARS-CoV-2 viral copies this assay can detect is 250  copies / mL. A negative result does not preclude SARS-CoV-2 infection  and should not be used as the sole basis for treatment or other  patient management decisions.  A negative result may occur with  improper specimen collection / handling, submission of specimen other  than nasopharyngeal swab, presence of viral mutation(s) within the  areas targeted by this assay, and inadequate number of viral copies  (<250 copies / mL). A negative result must be combined with clinical  observations, patient history, and epidemiological information. If result is POSITIVE SARS-CoV-2 target nucleic acids are DETECTED. The  SARS-CoV-2 RNA is generally detectable in upper and lower  respiratory specimens dur ing the acute phase of infection.  Positive  results are indicative of active infection with SARS-CoV-2.  Clinical  correlation with patient history and other diagnostic information is  necessary to determine patient infection status.  Positive results do  not rule out bacterial infection  or co-infection with other viruses. If result is PRESUMPTIVE POSTIVE SARS-CoV-2 nucleic acids MAY BE PRESENT.   A presumptive positive result was obtained on the submitted specimen  and confirmed on repeat testing.  While 2019 novel coronavirus  (SARS-CoV-2) nucleic acids may be present in the submitted sample  additional confirmatory testing may be necessary for epidemiological  and / or clinical management purposes  to differentiate between  SARS-CoV-2 and other Sarbecovirus currently known to infect humans.  If clinically indicated additional testing with an alternate test  methodology [phone]) is advised. The SARS-CoV-2 RNA is generally  detectable in upper and lower respiratory sp ecimens during the acute  phase of infection. The expected result is Negative. Fact Sheet for Patients:  StrictlyIdeas.no Fact Sheet for Healthcare Providers: BankingDealers.co.za This test is not yet approved or cleared by the [place] FDA and has been authorized for detection and/or diagnosis of SARS-CoV-2 by FDA under an Emergency Use Authorization (EUA).  This EUA will remain in effect (meaning this test can be used) for the duration of the COVID-19 declaration under Section 564(b)(1) of the Act, 21 U.S.C. section 360bbb-3(b)(1), unless the authorization is terminated or revoked sooner. Performed at [facility], [address] [place], [place] [address]   Lactic acid, plasma     Status: None   Collection Time: 04/13/19  6:34 AM  Result Value Ref Range   Lactic Acid, Venous  1.1 0.5 - 1.9 mmol/L    Comment: Performed at [facility], [address] [place], [place] [address]   Ct Abdomen Pelvis W Contrast  Result Date: 04/13/2019 CLINICAL DATA:  Eighty-a of endo contact cough radiology arm hallucis con about this [facility] CT on this CT EXAM: CT ABDOMEN AND PELVIS WITH CONTRAST TECHNIQUE: Multidetector CT imaging of the abdomen and pelvis was performed using the standard protocol following bolus administration of intravenous contrast. CONTRAST:  168mL OMNIPAQUE IOHEXOL 300 MG/ML  SOLN COMPARISON:  Prior CT from 03/18/2019. FINDINGS: Lower chest: Mild hazy subsegmental atelectatic changes seen dependently within the visualized lung bases. Visualized lungs are otherwise clear. Hepatobiliary: Liver demonstrates a normal contrast enhanced appearance. Gallbladder within normal limits. No biliary dilatation. Pancreas: 18 mm pseudocyst at the pancreatic body again noted, stable. No acute peripancreatic inflammation or abnormal pancreatic ductal dilatation. Spleen: Spleen within normal limits. Adrenals/Urinary Tract: Adrenal glands are normal. Kidneys equal in size with symmetric enhancement. 15 mm simple cyst present within the interpolar left kidney. No nephrolithiasis, hydronephrosis, or focal enhancing renal mass. No hydroureter. Partially distended bladder within normal limits. Stomach/Bowel: Prominent circumferential wall thickening seen about the visualized esophagus, improved from previous. Moderate gastric distension with prominent fluid within the gastric lumen, also improved relative to preoperative exam. Postoperative changes from interval gastrojejunostomy for gastric outlet obstruction. Multiple prominent and dilated loops of small bowel seen throughout the mid and left abdomen, measuring up to 3.8 cm in diameter. Associated internal air-fluid levels. There is a suspected transition point within the left abdomen, although exact transition point somewhat difficult to localize.  There is moderate to large volume free intraperitoneal air within the abdomen, felt to be greater than normal expected postoperative changes, concerning for perforated viscus. There is focal pneumatosis seen about a short segment loop of bowel within the left abdomen, which could reflect slight of perforation (series 5, image 50). No portal venous gas. Ileum is decompressed distally. Appendix within normal limits. Colon is diffusely decompressed with scattered retained barium contrast within the colonic lumen. Vascular/Lymphatic: Normal intravascular enhancement seen throughout  the intra-abdominal aorta. Mesenteric vessels patent proximally. No adenopathy. Reproductive: Prostate within normal limits. Other: Moderate volume free fluid within the pelvis, likely reactive. Musculoskeletal: No acute osseous abnormality. No discrete lytic or blastic osseous lesions. IMPRESSION: 1. Postoperative changes from recent gastrojejunostomy for gastric outlet obstruction, with multiple dilated loops of small bowel within the left and mid abdomen, concerning for small bowel obstruction. Large volume free air within the abdomen, greater than expected for normal postoperative changes, concerning for perforated viscus. Exact location of perforation is not definitely identified, however, the dilated small bowel is suspected to be the likely source of free air. Emergent surgical consultation recommended. 2. Persistent but improved gastric distension status post gastrojejunostomy. 3. Persistent but improved distal esophageal wall thickening, which could reflect acute esophagitis and/or changes related to reflux or vomiting. 4. Unchanged 1.8 cm cystic lesion within the pancreas, likely pseudocyst. Follow-up recommendations as previously described. Critical Value/emergent results were called by telephone at the time of interpretation on 04/13/2019 at 4:40 am to [place] , who verbally acknowledged these results. Electronically  Signed   By: Jeannine Boga M.D.   On: 04/13/2019 05:07   Dg Abd 2 Views  Result Date: 04/13/2019 CLINICAL DATA:  Recent surgery with increasing abdominal pain and bloating. EXAM: ABDOMEN - 2 VIEW COMPARISON:  04/02/2019 FINDINGS: There are scattered loops of small bowel scattered throughout the abdomen measuring up to approximately 5.5 cm in diameter. Air-fluid levels are noted. There is a moderate volume of pneumoperitoneum. Skin staples project over the patient's midline abdomen. IMPRESSION: 1. Moderate to high-grade small bowel obstruction. 2. Moderate volume of pneumoperitoneum. Correlation for timing of the patient's recent surgery is recommended. If there is clinical concern for perforation, follow-up with CT is recommended. Electronically Signed   By: Constance Holster M.D.   On: 04/13/2019 01:31    Review of Systems  Constitutional: Positive for malaise/fatigue.  HENT: Negative.   Eyes: Negative.   Respiratory: Negative.   Cardiovascular: Negative.   Gastrointestinal: Positive for abdominal pain, heartburn, nausea and vomiting.  Genitourinary: Negative.   Musculoskeletal: Negative.   Skin: Negative.   Neurological: Negative.   Endo/Heme/Allergies: Negative.   Psychiatric/Behavioral: The patient is nervous/anxious.     Blood pressure 100/61, pulse 70, temperature 98.1 F (36.7 C), temperature source Oral, resp. rate 11, SpO2 99 %. Physical Exam  Vitals reviewed. Constitutional: He is oriented to person, place, and time. He appears well-developed and well-nourished. He appears distressed.  HENT:  Head: Normocephalic and atraumatic.  Cardiovascular: Normal rate, regular rhythm and normal heart sounds. Exam reveals no gallop and no friction rub.  No murmur heard. Respiratory: Effort normal and breath sounds normal. No respiratory distress. He has no wheezes. He has no rales.  GI: He exhibits no distension. There is abdominal tenderness. There is rebound and guarding.   Abdomen is flat, incision healing well.  He does have tenderness in the abdomen especially to the right of the incision, but his belly does relax.  He does not have rigidity at this present time.  Neurological: He is alert and oriented to person, place, and time.  Skin: Skin is warm and dry.    CT scan images personally reviewed Assessment/Plan Impression: Delayed gastric emptying secondary to swollen gastrojejunal anastomosis.  I suspect there may be a component of gastric atony also present.  He does have free air that is present, though there is no significant intra-abdominal ascites.  He may have a leak, though I would have expected more free  fluid. Plan: Will admit to stepdown unit for pain control and IV fluids.  Patient adamantly refuses NG tube placement.  I told him this was needed in order to help avoid going back to the operating room.  He still refuses.  We will place PICC line and reassess abdomen with imaging in a.m.  Aviva Signs, MD 04/13/2019, 8:29 AM

## 2019-04-13 NOTE — H&P (Signed)
History and Physical    Cy Barrish [id number] DOB: [birth year] DOA: 04/12/2019  PCP: Patient, No Pcp Per  Patient coming from: Home  I have personally briefly reviewed patient's old medical records in [place]  Chief Complaint: Abdominal pain  HPI: Kamalei Vandervoort is a 33 y.o. male with medical history significant of alcohol abuse, chronic anemia, chronic alcoholic pancreatitis, recent gastric outlet obstruction status post gastrojejunostomy for decompression on 04/04/2019.  Patient was discharged on 04/07/2019.  He apparently had a fall sometime yesterday.  He says he got dizzy and lightheaded while walking and then fell straight down.  He sustained injury to his lip and chipped his 2 front teeth.  He also sustained injury to the right chest wall and abdomen.  He says he had increased abdominal pain since that time.  He reported some nausea and vomiting after coming to the ER. No hematemesis, melena, hematochezia. ED Course:  Vital Signs reviewed on presentation, significant for temperature 98.1, heart rate 94, blood pressure 120/74, saturation 99% on room air. Labs reviewed, significant for sodium 134, potassium 2.8, chloride 83, glucose 147, BUN 18, creatinine 1.05, lipase 57, LFTs within normal limits, lactic acid 1.9, WBC count 16.7, hemoglobin 10.2, hematocrit 32, MCV 72, platelets 836. Imaging personally Reviewed, CT of the abdomen pelvis shows Postoperative changes from recent gastrojejunostomy for gastric outlet obstruction, with multiple dilated loops of small bowel within the left and mid abdomen, concerning for small bowel obstruction. Large volume free air within the abdomen, greater than expected for normal postoperative changes, concerning for perforated viscus. Exact location of perforation is not definitely identified, however, the dilated small bowel is suspected to be the likely source of free air. Emergent surgical consultation recommended. Persistent but improved gastric  distension status post gastrojejunostomy. Persistent but improved distal esophageal wall thickening, which could reflect acute esophagitis and/or changes related to reflux or vomiting. EKG personally reviewed, shows sinus tachycardia, no acute ST-T changes. Case was immediately discussed by the ED physician with Dr. Arnoldo Morale from general surgery service due to findings of free air, perforated viscus and peritonitis.  Recommend admission to the hospital service and will come and see the patient within 2 hours.  Rapid Covid test has been ordered.  Review of Systems: As per HPI otherwise 10 point review of systems negative.  All other review of systems is negative except the ones noted above in the HPI.  Past Medical History:  Diagnosis Date  . GERD (gastroesophageal reflux disease)   . Pancreatitis     Past Surgical History:  Procedure Laterality Date  . ESOPHAGOGASTRODUODENOSCOPY (EGD) WITH PROPOFOL N/A 04/01/2019   Procedure: ESOPHAGOGASTRODUODENOSCOPY (EGD) WITH PROPOFOL;  Surgeon: Danie Binder, MD;  Location: [facility];  Service: Endoscopy;  Laterality: N/A;  . GASTROJEJUNOSTOMY N/A 04/04/2019   Procedure: GASTROJEJUNOSTOMY;  Surgeon: Virl Cagey, MD;  Location: [facility] ORS;  Service: General;  Laterality: N/A;  . NO PAST SURGERIES       reports that he has been smoking cigarettes. He has been smoking about 1.00 pack per day. He has never used smokeless tobacco. He reports current alcohol use. He reports previous drug use. Drug: Marijuana.  No Known Allergies  Family History  Problem Relation Age of Onset  . Cancer Other    Family history reviewed, noted as above, not pertinent to current presentation.   Prior to Admission medications   Medication Sig Start Date End Date Taking? Authorizing Provider  acetaminophen (TYLENOL) 325 MG tablet Take 2 tablets (  650 mg total) by mouth every 4 (four) hours as needed for mild pain, fever or headache. 04/08/19 04/07/20  Roxan Hockey, MD  lipase/protease/amylase (CREON) 36000 UNITS CPEP capsule Take 2 capsules (72,000 Units total) by mouth 3 (three) times daily with meals. 04/08/19   Roxan Hockey, MD  Multiple Vitamin (MULTIVITAMIN WITH MINERALS) TABS tablet Take 1 tablet by mouth daily. 04/08/19   Roxan Hockey, MD  nicotine (NICODERM CQ - DOSED IN MG/24 HOURS) 14 mg/24hr patch Place 1 patch (14 mg total) onto the skin daily. 04/08/19 05/08/19  Roxan Hockey, MD  ondansetron (ZOFRAN) 4 MG tablet Take 1 tablet (4 mg total) by mouth every 6 (six) hours as needed for nausea. 04/08/19   Virl Cagey, MD  oxyCODONE (OXY IR/ROXICODONE) 5 MG immediate release tablet Take 1 tablet (5 mg total) by mouth every 4 (four) hours as needed for up to 7 days for severe pain or breakthrough pain. 04/11/19 04/18/19  Virl Cagey, MD  pantoprazole (PROTONIX) 40 MG tablet Take 1 tablet (40 mg total) by mouth 2 (two) times daily. 04/08/19   Virl Cagey, MD  ranitidine (ZANTAC) 300 MG tablet Take 1 tablet (300 mg total) by mouth at bedtime. 04/08/19 04/07/20  Roxan Hockey, MD  senna-docusate (SENOKOT-S) 8.6-50 MG tablet Take 2 tablets by mouth at bedtime. 04/08/19 06/07/19  Roxan Hockey, MD  traZODone (DESYREL) 100 MG tablet Take 1 tablet (100 mg total) by mouth at bedtime. 04/08/19   Roxan Hockey, MD    Physical Exam: Vitals:   04/12/19 2336  BP: 111/88  Pulse: (!) 108  Resp: 17  Temp: 98.1 F (36.7 C)  TempSrc: Oral  SpO2: 100%    Constitutional: NAD, calm, comfortable Vitals:   04/12/19 2336  BP: 111/88  Pulse: (!) 108  Resp: 17  Temp: 98.1 F (36.7 C)  TempSrc: Oral  SpO2: 100%   Eyes: PERRL, lids and conjunctivae normal ENMT: Mucous membranes are moist. Posterior pharynx clear of any exudate or lesions.Normal dentition.  Neck: normal, supple, no masses, no thyromegaly Respiratory: clear to auscultation bilaterally, no wheezing, no crackles. Normal respiratory effort. No accessory muscle  use.  Mild right chest wall tenderness Cardiovascular: Regular rate and rhythm, no murmurs / rubs / gallops. No extremity edema. 2+ pedal pulses. No carotid bruits.  Abdomen: Severe abdominal tenderness with guarding and mild rigidity, staples noted on epigastric region, no wound dehiscence.   Musculoskeletal: no clubbing / cyanosis. No joint deformity upper and lower extremities. Good ROM, no contractures. Normal muscle tone.  Skin: no rashes, lesions, ulcers. No induration Neurologic: CN 2-12 grossly intact. Sensation intact, DTR normal. Strength 5/5 in all 4.  Psychiatric: Normal judgment and insight. Alert and oriented x 3. Normal mood.    Decubitus Ulcers: Not present on admission Catheters and tubes: None   Labs on Admission: I have personally reviewed following labs and imaging studies  CBC: Recent Labs  Lab 04/06/19 0639 04/07/19 0554 04/13/19 0130  WBC 9.6 7.2 16.7*  NEUTROABS  --   --  12.6*  HGB 8.2* 8.0* 10.2*  HCT 26.5* 25.8* 32.6*  MCV 74.9* 76.1* 72.4*  PLT 458* 449* [id number]*   Basic Metabolic Panel: Recent Labs  Lab 04/06/19 0639 04/07/19 0554 04/13/19 0130  NA 136 138 134*  K 3.6 4.3 2.8*  CL 101 103 83*  CO2 25 26 35*  GLUCOSE 112* 101* 147*  BUN 9 8 18   CREATININE 0.77 0.92 1.05  CALCIUM 9.1 9.2 10.1  MG  --   --  2.0   GFR: Estimated Creatinine Clearance: 101.4 mL/min (by C-G formula based on SCr of 1.05 mg/dL). Liver Function Tests: Recent Labs  Lab 04/06/19 0639 04/13/19 0130  AST 11* 15  ALT 8 12  ALKPHOS 59 74  BILITOT 0.6 0.3  PROT 6.3* 8.7*  ALBUMIN 2.9* 4.3   Recent Labs  Lab 04/13/19 0130  LIPASE 57*   No results for input(s): AMMONIA in the last 168 hours. Coagulation Profile: No results for input(s): INR, PROTIME in the last 168 hours. Cardiac Enzymes: No results for input(s): CKTOTAL, CKMB, CKMBINDEX, TROPONINI in the last 168 hours. BNP (last 3 results) No results for input(s): PROBNP in the last 8760 hours. HbA1C: No  results for input(s): HGBA1C in the last 72 hours. CBG: No results for input(s): GLUCAP in the last 168 hours. Lipid Profile: No results for input(s): CHOL, HDL, LDLCALC, TRIG, CHOLHDL, LDLDIRECT in the last 72 hours. Thyroid Function Tests: No results for input(s): TSH, T4TOTAL, FREET4, T3FREE, THYROIDAB in the last 72 hours. Anemia Panel: No results for input(s): VITAMINB12, FOLATE, FERRITIN, TIBC, IRON, RETICCTPCT in the last 72 hours. Urine analysis:    Component Value Date/Time   COLORURINE YELLOW 03/30/2019 0631   APPEARANCEUR CLOUDY (A) 03/30/2019 0631   LABSPEC 1.018 03/30/2019 0631   PHURINE 9.0 (H) 03/30/2019 0631   GLUCOSEU NEGATIVE 03/30/2019 0631   HGBUR NEGATIVE 03/30/2019 0631   BILIRUBINUR NEGATIVE 03/30/2019 0631   KETONESUR NEGATIVE 03/30/2019 0631   PROTEINUR 30 (A) 03/30/2019 0631   NITRITE NEGATIVE 03/30/2019 0631   LEUKOCYTESUR NEGATIVE 03/30/2019 0631    Radiological Exams on Admission: Ct Abdomen Pelvis W Contrast  Result Date: 04/13/2019 CLINICAL DATA:  Eighty-a of endo contact cough radiology arm hallucis con about this [place] CT on this CT EXAM: CT ABDOMEN AND PELVIS WITH CONTRAST TECHNIQUE: Multidetector CT imaging of the abdomen and pelvis was performed using the standard protocol following bolus administration of intravenous contrast. CONTRAST:  113mL OMNIPAQUE IOHEXOL 300 MG/ML  SOLN COMPARISON:  Prior CT from 03/18/2019. FINDINGS: Lower chest: Mild hazy subsegmental atelectatic changes seen dependently within the visualized lung bases. Visualized lungs are otherwise clear. Hepatobiliary: Liver demonstrates a normal contrast enhanced appearance. Gallbladder within normal limits. No biliary dilatation. Pancreas: 18 mm pseudocyst at the pancreatic body again noted, stable. No acute peripancreatic inflammation or abnormal pancreatic ductal dilatation. Spleen: Spleen within normal limits. Adrenals/Urinary Tract: Adrenal glands are normal. Kidneys equal in size  with symmetric enhancement. 15 mm simple cyst present within the interpolar left kidney. No nephrolithiasis, hydronephrosis, or focal enhancing renal mass. No hydroureter. Partially distended bladder within normal limits. Stomach/Bowel: Prominent circumferential wall thickening seen about the visualized esophagus, improved from previous. Moderate gastric distension with prominent fluid within the gastric lumen, also improved relative to preoperative exam. Postoperative changes from interval gastrojejunostomy for gastric outlet obstruction. Multiple prominent and dilated loops of small bowel seen throughout the mid and left abdomen, measuring up to 3.8 cm in diameter. Associated internal air-fluid levels. There is a suspected transition point within the left abdomen, although exact transition point somewhat difficult to localize. There is moderate to large volume free intraperitoneal air within the abdomen, felt to be greater than normal expected postoperative changes, concerning for perforated viscus. There is focal pneumatosis seen about a short segment loop of bowel within the left abdomen, which could reflect slight of perforation (series 5, image 50). No portal venous gas. Ileum is decompressed distally. Appendix within normal limits. Colon is diffusely decompressed with scattered retained barium  contrast within the colonic lumen. Vascular/Lymphatic: Normal intravascular enhancement seen throughout the intra-abdominal aorta. Mesenteric vessels patent proximally. No adenopathy. Reproductive: Prostate within normal limits. Other: Moderate volume free fluid within the pelvis, likely reactive. Musculoskeletal: No acute osseous abnormality. No discrete lytic or blastic osseous lesions. IMPRESSION: 1. Postoperative changes from recent gastrojejunostomy for gastric outlet obstruction, with multiple dilated loops of small bowel within the left and mid abdomen, concerning for small bowel obstruction. Large volume free  air within the abdomen, greater than expected for normal postoperative changes, concerning for perforated viscus. Exact location of perforation is not definitely identified, however, the dilated small bowel is suspected to be the likely source of free air. Emergent surgical consultation recommended. 2. Persistent but improved gastric distension status post gastrojejunostomy. 3. Persistent but improved distal esophageal wall thickening, which could reflect acute esophagitis and/or changes related to reflux or vomiting. 4. Unchanged 1.8 cm cystic lesion within the pancreas, likely pseudocyst. Follow-up recommendations as previously described. Critical Value/emergent results were called by telephone at the time of interpretation on 04/13/2019 at 4:40 am to [place] , who verbally acknowledged these results. Electronically Signed   By: Jeannine Boga M.D.   On: 04/13/2019 05:07   Dg Abd 2 Views  Result Date: 04/13/2019 CLINICAL DATA:  Recent surgery with increasing abdominal pain and bloating. EXAM: ABDOMEN - 2 VIEW COMPARISON:  04/02/2019 FINDINGS: There are scattered loops of small bowel scattered throughout the abdomen measuring up to approximately 5.5 cm in diameter. Air-fluid levels are noted. There is a moderate volume of pneumoperitoneum. Skin staples project over the patient's midline abdomen. IMPRESSION: 1. Moderate to high-grade small bowel obstruction. 2. Moderate volume of pneumoperitoneum. Correlation for timing of the patient's recent surgery is recommended. If there is clinical concern for perforation, follow-up with CT is recommended. Electronically Signed   By: Constance Holster M.D.   On: 04/13/2019 01:31      Assessment/Plan Active Problems:   Alcohol abuse   Leukocytosis   Abdominal pain, epigastric   Pancreatitis   Hypokalemia   Essential hypertension   Tobacco abuse   GERD (gastroesophageal reflux disease)   Pancreatic pseudocyst/cyst--alcoholic pancreatitis    Gastric outlet obstruction   Peritonitis ([place])     Principal Problem:  Peritonitis likely due to perforated viscus: With small bowel obstruction Patient presented with worsening abdominal pain.  CT of the abdomen shows large volume free air within the abdomen with multiple dilated small bowel loops presumably the source of free air. Plan:  Urgent surgical consult, Dr. Arnoldo Morale has been notified and is in process of coming into the ER for taking the patient to the OR.  We will plan for hospital admission in the meantime Patient is refusing NG tube placement while awake.  He says he would only want it if he is asleep. Placed on IV PPI Pain meds as needed We will place on IV Zosyn  Rapid Covid test pending  Other Active Problems: Possible syncope: Patient presented with a fall yesterday preceded by dizziness and lightheadedness.  Appears to be possible orthostatic hypotension versus vasovagal.  EKG nonischemic.  Troponin has been ordered. We will place on telemetry  Hypokalemia: We will monitor and supplement electrolytes.  Magnesium level is normal.  Continue telemetry.  History of chronic alcoholic pancreatitis with prior history of pseudocyst: Continue Creon with meals once patient starts p.o. intake Follow-up as outpatient for imaging surveillance for pancreatic pseudocyst.  Chronic microcytic anemia: Patient presented with hemoglobin of 10.2, actual appears to be  improved from prior level of 8.0 on 04/07/2019.  Does have microcytosis.  Did receive 2 units of PRBCs on prior admission.  Consider anemia work-up once stable.  History of alcohol abuse: Denies any recent alcohol use.  Continue multivitamin, thiamine, folic acid No evidence of DTs at this time. We will place on CIWA protocol with Ativan as needed  Thrombocytosis Likely reactive, stable.  Will monitor  DVT prophylaxis: SCDs Code Status:  Full code Family Communication: N/A  Disposition Plan: Patient admitted with  peritonitis, will need urgent surgical intervention, disposition based on clinical course, expect at least 3 to 4 days. Consults called: N/A Admission status: Inpatient   Lynetta Mare MD [facility]  If 7PM-7AM, please contact night-coverage   04/13/2019, 5:54 AM

## 2019-04-13 NOTE — Progress Notes (Signed)
Pharmacy Antibiotic Note  David Miranda is a 33 y.o. male admitted on 04/12/2019 with sepsis.  Pharmacy has been consulted for zosyn dosing.  Plan: Zosyn 3.375g IV q8h (4 hour infusion).     Temp (24hrs), Avg:98.1 F (36.7 C), Min:98.1 F (36.7 C), Max:98.1 F (36.7 C)  Recent Labs  Lab 04/07/19 0554 04/13/19 0130 04/13/19 0634  WBC 7.2 16.7*  --   CREATININE 0.92 1.05  --   LATICACIDVEN  --  1.9 1.1    Estimated Creatinine Clearance: 101.4 mL/min (by C-G formula based on SCr of 1.05 mg/dL).    No Known Allergies  Antimicrobials this admission: 11/8 zosyn  >>   Microbiology results: N/A  Thank you for allowing pharmacy to be a part of this patient's care.  David Miranda 04/13/2019 8:14 AM

## 2019-04-13 NOTE — ED Notes (Addendum)
This nurse went into start potassium on pt and stated to him that we were also going to have to do a NG tube on him and that he would be going to surgery this morning. Pt stated "absolutely not, I need to be asleep for that shit, it hurts too damn bad."  Dr Tomi Bamberger at bedside Pt still refusing NG tube placement. Pt made aware of risks for not having NG tube. Pt verbalized understanding.

## 2019-04-13 NOTE — Progress Notes (Signed)
  Patient seen and evaluated, chart reviewed, please see EMR for updated orders. Please see full H&P dictated by admitting physician Dr Scherrie November for same date of service.    As per Dr Arnoldo Morale pt is being transferred to Gen surgery service as of 04/13/2019  Hospitalist service will sign off at this time  -Please recall Hospitalist service if needed  Thank you  Thi Klich

## 2019-04-13 NOTE — ED Notes (Addendum)
Spoken with dr.jenkins to see about time of surgery for pt. Dr.jenkins stated he was unsure if pt was going to surgery or not. Dr made aware of pt refusing NG tube, dr Arnoldo Morale stated he would be in to see the pt within the hour.

## 2019-04-13 NOTE — Progress Notes (Addendum)
[facility]  Agree with Dr. Arnoldo Morale. Patient POD 9 s/p loop gastrojejunostomy for gastric outlet obstruction. Vitals all stable, no lactic acid, hypokalemic, hypochloremic metabolic alkalosis consistent with vomiting, and elevated WBC but no shift, and H&H is 10 when he left it was 8, making this look more like hemoconcentration from poor intake and vomiting.    I need further assess and review the imaging and likely do UGI tomorrow but anastomosis looks patent.  CT a/p- Long segment of bowel with dilation but difficult to follow but no obvious transition points to point to obstruction but distal loops do appear decompressed but are tapering down. Some fluid but not a large amount, agree with some free air but not tracking along the anastomosis, and the area with concern for pneumatosis is dependent and not circumferential, I think this may just be some thickening of the bowel.  Stomach is less dilated then prior to his gastrojejunostomy.   Patient needs NG placed, and encourage him to let team place. Resuscitation now.  Curlene Labrum, MD [facility] [address] [place], [place] [address] [phone] (office)

## 2019-04-14 ENCOUNTER — Inpatient Hospital Stay (HOSPITAL_COMMUNITY): Payer: Self-pay | Admitting: Anesthesiology

## 2019-04-14 ENCOUNTER — Inpatient Hospital Stay (HOSPITAL_COMMUNITY): Payer: Self-pay

## 2019-04-14 ENCOUNTER — Encounter (HOSPITAL_COMMUNITY): Payer: Self-pay | Admitting: Anesthesiology

## 2019-04-14 ENCOUNTER — Encounter (HOSPITAL_COMMUNITY)
Admission: EM | Disposition: A | Discharge status: Home / Self Care | Payer: Self-pay | Source: Home / Self Care | Attending: General Surgery

## 2019-04-14 DIAGNOSIS — K56609 Unspecified intestinal obstruction, unspecified as to partial versus complete obstruction: Secondary | ICD-10-CM

## 2019-04-14 HISTORY — PX: GASTROJEJUNOSTOMY: SHX1697

## 2019-04-14 LAB — BASIC METABOLIC PANEL
Anion gap: 4 — ABNORMAL LOW (ref 5–15)
BUN: 11 mg/dL (ref 6–20)
CO2: 27 mmol/L (ref 22–32)
Calcium: 8 mg/dL — ABNORMAL LOW (ref 8.9–10.3)
Chloride: 104 mmol/L (ref 98–111)
Creatinine, Ser: 0.94 mg/dL (ref 0.61–1.24)
GFR calc Af Amer: >60 mL/min (ref >60)
GFR calc non Af Amer: >60 mL/min (ref >60)
Glucose, Bld: 90 mg/dL (ref 70–99)
Potassium: 3.4 mmol/L — ABNORMAL LOW (ref 3.5–5.1)
Sodium: 135 mmol/L (ref 135–145)

## 2019-04-14 LAB — CBC WITH DIFFERENTIAL/PLATELET
Abs Immature Granulocytes: 0.07 10*3/uL (ref 0.00–0.07)
Basophils Absolute: 0 10*3/uL (ref 0.0–0.1)
Basophils Relative: 0 %
Eosinophils Absolute: 0.6 10*3/uL — ABNORMAL HIGH (ref 0.0–0.5)
Eosinophils Relative: 6 %
HCT: 21.8 % — ABNORMAL LOW (ref 39.0–52.0)
Hemoglobin: 6.4 g/dL — CL (ref 13.0–17.0)
Immature Granulocytes: 1 %
Lymphocytes Relative: 28 %
Lymphs Abs: 2.7 10*3/uL (ref 0.7–4.0)
MCH: 22.1 pg — ABNORMAL LOW (ref 26.0–34.0)
MCHC: 29.4 g/dL — ABNORMAL LOW (ref 30.0–36.0)
MCV: 75.4 fL — ABNORMAL LOW (ref 80.0–100.0)
Monocytes Absolute: 0.8 10*3/uL (ref 0.1–1.0)
Monocytes Relative: 8 %
Neutro Abs: 5.5 10*3/uL (ref 1.7–7.7)
Neutrophils Relative %: 57 %
Platelets: 624 10*3/uL — ABNORMAL HIGH (ref 150–400)
RBC: 2.89 MIL/uL — ABNORMAL LOW (ref 4.22–5.81)
RDW: 21.3 % — ABNORMAL HIGH (ref 11.5–15.5)
WBC: 9.6 10*3/uL (ref 4.0–10.5)
nRBC: 0 % (ref 0.0–0.2)

## 2019-04-14 LAB — PHOSPHORUS: Phosphorus: 3 mg/dL (ref 2.5–4.6)

## 2019-04-14 LAB — PREPARE RBC (CROSSMATCH)

## 2019-04-14 LAB — POCT HEMOGLOBIN-HEMACUE: Hemoglobin: 11.7 g/dL — ABNORMAL LOW (ref 13.0–17.0)

## 2019-04-14 LAB — MAGNESIUM: Magnesium: 1.9 mg/dL (ref 1.7–2.4)

## 2019-04-14 SURGERY — GASTROJEJUNOSTOMY
Anesthesia: General

## 2019-04-14 MED ORDER — DEXMEDETOMIDINE HCL IN NACL 200 MCG/50ML IV SOLN
INTRAVENOUS | Status: AC
  Filled 2019-04-14: qty 50

## 2019-04-14 MED ORDER — BUPIVACAINE LIPOSOME 1.3 % IJ SUSP
INTRAMUSCULAR | Status: DC | PRN
  Administered 2019-04-14: 20 mL

## 2019-04-14 MED ORDER — SODIUM CHLORIDE 0.9% FLUSH
10.0000 mL | Freq: Two times a day (BID) | INTRAVENOUS | Status: DC
  Administered 2019-04-14: 10:00:00 10 mL
  Administered 2019-04-14: 23 mL
  Administered 2019-04-15 – 2019-04-17 (×4): 10 mL
  Administered 2019-04-17: 40 mL
  Administered 2019-04-18: 20 mL

## 2019-04-14 MED ORDER — FENTANYL CITRATE (PF) 100 MCG/2ML IJ SOLN
INTRAMUSCULAR | Status: AC
  Filled 2019-04-14: qty 2

## 2019-04-14 MED ORDER — SODIUM CHLORIDE 0.9 % IV SOLN
2.0000 g | INTRAVENOUS | Status: AC
  Administered 2019-04-14: 2 g via INTRAVENOUS
  Filled 2019-04-14: qty 2

## 2019-04-14 MED ORDER — BUPIVACAINE LIPOSOME 1.3 % IJ SUSP
INTRAMUSCULAR | Status: AC
  Filled 2019-04-14: qty 20

## 2019-04-14 MED ORDER — SUGAMMADEX SODIUM 200 MG/2ML IV SOLN
INTRAVENOUS | Status: DC | PRN
  Administered 2019-04-14: 134.8 mg via INTRAVENOUS

## 2019-04-14 MED ORDER — ARTIFICIAL TEARS OPHTHALMIC OINT
TOPICAL_OINTMENT | OPHTHALMIC | Status: AC
  Filled 2019-04-14: qty 7

## 2019-04-14 MED ORDER — SODIUM CHLORIDE 0.9 % IV BOLUS
1000.0000 mL | Freq: Once | INTRAVENOUS | Status: AC
  Administered 2019-04-14: 1000 mL via INTRAVENOUS

## 2019-04-14 MED ORDER — SUFENTANIL CITRATE 50 MCG/ML IV SOLN
INTRAVENOUS | Status: DC | PRN
  Administered 2019-04-14: 10 ug via INTRAVENOUS
  Administered 2019-04-14: 5 ug via INTRAVENOUS
  Administered 2019-04-14 (×2): 10 ug via INTRAVENOUS

## 2019-04-14 MED ORDER — CHLORHEXIDINE GLUCONATE CLOTH 2 % EX PADS: 6.0000 | MEDICATED_PAD | Freq: Once | CUTANEOUS | Status: DC

## 2019-04-14 MED ORDER — PROPOFOL 10 MG/ML IV BOLUS
INTRAVENOUS | Status: DC | PRN
  Administered 2019-04-14: 60 mg via INTRAVENOUS

## 2019-04-14 MED ORDER — FENTANYL CITRATE (PF) 250 MCG/5ML IJ SOLN
INTRAMUSCULAR | Status: AC
  Filled 2019-04-14: qty 5

## 2019-04-14 MED ORDER — MIDAZOLAM HCL 2 MG/2ML IJ SOLN
INTRAMUSCULAR | Status: AC
  Filled 2019-04-14: qty 2

## 2019-04-14 MED ORDER — CHLORHEXIDINE GLUCONATE CLOTH 2 % EX PADS
6.0000 | MEDICATED_PAD | Freq: Once | CUTANEOUS | Status: AC
  Administered 2019-04-14: 6 via TOPICAL

## 2019-04-14 MED ORDER — PROMETHAZINE HCL 25 MG/ML IJ SOLN: 6.2500 mg | INTRAMUSCULAR | Status: DC | PRN

## 2019-04-14 MED ORDER — SODIUM CHLORIDE 0.9% FLUSH: 10.0000 mL | INTRAVENOUS | Status: DC | PRN

## 2019-04-14 MED ORDER — SUCCINYLCHOLINE CHLORIDE 20 MG/ML IJ SOLN
INTRAMUSCULAR | Status: DC | PRN
  Administered 2019-04-14: 110 mg via INTRAVENOUS

## 2019-04-14 MED ORDER — SODIUM CHLORIDE 0.9% IV SOLUTION
Freq: Once | INTRAVENOUS | Status: AC
  Administered 2019-04-14: 10:00:00 via INTRAVENOUS

## 2019-04-14 MED ORDER — SUFENTANIL CITRATE 50 MCG/ML IV SOLN
INTRAVENOUS | Status: AC
  Filled 2019-04-14: qty 1

## 2019-04-14 MED ORDER — HYDROCODONE-ACETAMINOPHEN 7.5-325 MG PO TABS: 1.0000 | ORAL_TABLET | Freq: Once | ORAL | Status: DC | PRN

## 2019-04-14 MED ORDER — SODIUM CHLORIDE 0.9 % IR SOLN
Status: DC | PRN
  Administered 2019-04-14 (×3): 1000 mL

## 2019-04-14 MED ORDER — LACTATED RINGERS IV SOLN
INTRAVENOUS | Status: DC
  Administered 2019-04-14 (×3): via INTRAVENOUS

## 2019-04-14 MED ORDER — PHENYLEPHRINE HCL (PRESSORS) 10 MG/ML IV SOLN
INTRAVENOUS | Status: DC | PRN
  Administered 2019-04-14: 80 ug via INTRAVENOUS

## 2019-04-14 MED ORDER — BENZOCAINE 20 % MT AERO
INHALATION_SPRAY | Freq: Once | OROMUCOSAL | Status: AC
  Administered 2019-04-14: 08:00:00 via OROMUCOSAL
  Filled 2019-04-14: qty 57

## 2019-04-14 MED ORDER — ENOXAPARIN SODIUM 40 MG/0.4ML ~~LOC~~ SOLN: 40.0000 mg | SUBCUTANEOUS | Status: DC

## 2019-04-14 MED ORDER — PHENYLEPHRINE 40 MCG/ML (10ML) SYRINGE FOR IV PUSH (FOR BLOOD PRESSURE SUPPORT)
PREFILLED_SYRINGE | INTRAVENOUS | Status: AC
  Filled 2019-04-14: qty 10

## 2019-04-14 MED ORDER — MIDAZOLAM HCL 5 MG/5ML IJ SOLN
INTRAMUSCULAR | Status: DC | PRN
  Administered 2019-04-14: 2 mg via INTRAVENOUS

## 2019-04-14 MED ORDER — EPHEDRINE SULFATE 50 MG/ML IJ SOLN
INTRAMUSCULAR | Status: DC | PRN
  Administered 2019-04-14: 10 mg via INTRAVENOUS

## 2019-04-14 MED ORDER — CHLORHEXIDINE GLUCONATE CLOTH 2 % EX PADS
6.0000 | MEDICATED_PAD | Freq: Every day | CUTANEOUS | Status: DC
  Administered 2019-04-15 – 2019-04-18 (×4): 6 via TOPICAL

## 2019-04-14 MED ORDER — ROCURONIUM BROMIDE 100 MG/10ML IV SOLN
INTRAVENOUS | Status: DC | PRN
  Administered 2019-04-14: 10 mg via INTRAVENOUS
  Administered 2019-04-14: 40 mg via INTRAVENOUS
  Administered 2019-04-14: 20 mg via INTRAVENOUS

## 2019-04-14 MED ORDER — FENTANYL CITRATE (PF) 100 MCG/2ML IJ SOLN
INTRAMUSCULAR | Status: DC | PRN
  Administered 2019-04-14 (×7): 50 ug via INTRAVENOUS

## 2019-04-14 MED ORDER — CHLORHEXIDINE GLUCONATE CLOTH 2 % EX PADS: 6.0000 | MEDICATED_PAD | Freq: Once | CUTANEOUS | Status: AC

## 2019-04-14 SURGICAL SUPPLY — 49 items
CELLS DAT CNTRL 66122 CELL SVR (MISCELLANEOUS) ×2 IMPLANT
CHLORAPREP W/TINT 26 (MISCELLANEOUS) ×3 IMPLANT
CLOTH BEACON ORANGE TIMEOUT ST (SAFETY) ×3 IMPLANT
COVER LIGHT HANDLE STERIS (MISCELLANEOUS) ×6 IMPLANT
DRAPE WARM FLUID 44X44 (DRAPES) ×3 IMPLANT
DRSG OPSITE POSTOP 4X10 (GAUZE/BANDAGES/DRESSINGS) ×3 IMPLANT
DRSG OPSITE POSTOP 4X8 (GAUZE/BANDAGES/DRESSINGS) IMPLANT
ELECT REM PT RETURN 9FT ADLT (ELECTROSURGICAL) ×3
ELECTRODE REM PT RTRN 9FT ADLT (ELECTROSURGICAL) ×2 IMPLANT
GLOVE BIO SURGEON STRL SZ 6.5 (GLOVE) ×3 IMPLANT
GLOVE BIOGEL PI IND STRL 6.5 (GLOVE) ×2 IMPLANT
GLOVE BIOGEL PI IND STRL 7.0 (GLOVE) ×4 IMPLANT
GLOVE BIOGEL PI INDICATOR 6.5 (GLOVE) ×1
GLOVE BIOGEL PI INDICATOR 7.0 (GLOVE) ×2
GLOVE ECLIPSE 6.5 STRL STRAW (GLOVE) ×3 IMPLANT
GLOVE SURG SS PI 7.5 STRL IVOR (GLOVE) ×3 IMPLANT
GOWN STRL REUS W/TWL LRG LVL3 (GOWN DISPOSABLE) ×9 IMPLANT
HANDLE SUCTION POOLE (INSTRUMENTS) ×4 IMPLANT
INST SET MAJOR GENERAL (KITS) ×3 IMPLANT
KIT REMOVER STAPLE SKIN (MISCELLANEOUS) ×3 IMPLANT
KIT TURNOVER KIT A (KITS) ×3 IMPLANT
LIGASURE IMPACT 36 18CM CVD LR (INSTRUMENTS) ×3 IMPLANT
MANIFOLD NEPTUNE II (INSTRUMENTS) ×3 IMPLANT
NEEDLE HYPO 18GX1.5 BLUNT FILL (NEEDLE) ×3 IMPLANT
NEEDLE HYPO 21X1.5 SAFETY (NEEDLE) ×3 IMPLANT
NS IRRIG 1000ML POUR BTL (IV SOLUTION) ×6 IMPLANT
PACK ABDOMINAL MAJOR (CUSTOM PROCEDURE TRAY) ×3 IMPLANT
PAD ARMBOARD 7.5X6 YLW CONV (MISCELLANEOUS) ×3 IMPLANT
RELOAD LINEAR CUT PROX 55 BLUE (ENDOMECHANICALS) ×6 IMPLANT
RTRCTR WOUND ALEXIS 18CM MED (MISCELLANEOUS) ×3
SET BASIN LINEN APH (SET/KITS/TRAYS/PACK) ×3 IMPLANT
SPONGE LAP 18X18 RF (DISPOSABLE) ×3 IMPLANT
STAPLER GUN LINEAR PROX 60 (STAPLE) ×6 IMPLANT
STAPLER PROXIMATE 100MM BLUE (MISCELLANEOUS) ×3 IMPLANT
STAPLER PROXIMATE 55 BLUE (STAPLE) ×3 IMPLANT
STAPLER PROXIMATE 75MM BLUE (STAPLE) ×3 IMPLANT
STAPLER VISISTAT (STAPLE) ×3 IMPLANT
SUCTION POOLE HANDLE (INSTRUMENTS) ×6
SUT CHROMIC 0 SH (SUTURE) IMPLANT
SUT CHROMIC 2 0 SH (SUTURE) ×3 IMPLANT
SUT CHROMIC 3 0 SH 27 (SUTURE) ×6 IMPLANT
SUT PDS AB CT VIOLET #0 27IN (SUTURE) ×6 IMPLANT
SUT PROLENE 2 0 SH 30 (SUTURE) ×3 IMPLANT
SUT SILK 2 0 (SUTURE) ×1
SUT SILK 2-0 18XBRD TIE 12 (SUTURE) ×2 IMPLANT
SUT SILK 3 0 SH CR/8 (SUTURE) ×9 IMPLANT
SYR 20ML LL LF (SYRINGE) ×6 IMPLANT
TRAY FOLEY W/BAG SLVR 16FR (SET/KITS/TRAYS/PACK) ×1
TRAY FOLEY W/BAG SLVR 16FR ST (SET/KITS/TRAYS/PACK) ×2 IMPLANT

## 2019-04-14 NOTE — Anesthesia Postprocedure Evaluation (Signed)
Anesthesia Post Note  Patient: David Miranda  Procedure(s) Performed: REVISION GASTROJEJUNOSTOMY TO ROUX-EN-Y  Patient location during evaluation: PACU Anesthesia Type: General Level of consciousness: sedated and patient cooperative Pain management: pain level controlled Vital Signs Assessment: post-procedure vital signs reviewed and stable Respiratory status: spontaneous breathing and patient connected to nasal cannula oxygen Cardiovascular status: stable Postop Assessment: no apparent nausea or vomiting Anesthetic complications: no     Last Vitals:  Vitals:   04/14/19 1630 04/14/19 1645  BP: 120/78 123/78  Pulse: 95 95  Resp: 17 16  Temp:    SpO2: 92% 92%    Last Pain:  Vitals:   04/14/19 1312  TempSrc: Oral  PainSc: 8                  Avianah Pellman A

## 2019-04-14 NOTE — Progress Notes (Signed)
[facility]  Spoke with his mother, Dory Larsen, notified her everything went well. The anastomosis was twisted and had to be revised.  He did well.  Curlene Labrum, MD [facility] [address] [place], [place] [address] [phone] (office)

## 2019-04-14 NOTE — Progress Notes (Signed)
Per verbal order from Dr. Arnoldo Morale: type and cross screen patient and ordered 2 UPRBC. Pt has already been cross matched and current blood bank band on. Ordered 2 UPRBC.

## 2019-04-14 NOTE — Progress Notes (Signed)
  33 y.o. male with medical history significant of alcohol abuse, chronic anemia, chronic alcoholic pancreatitis, recent gastric outlet obstruction status post gastrojejunostomy for decompression on 04/04/2019.  Patient was discharged on 04/07/2019-Readmitted 04/13/2019 with abd pain   Courtesy visit with pt in ICU today-- he is resting comfortably after iv dilaudid and iv Lorazepam  - Pt  transferred to Gen surgery service   Plan is going back to OR on 04/14/2019  Hospitalist service will sign off at this time  Gen surgery will recall Hospitalist service if needed  Roxan Hockey, MD

## 2019-04-14 NOTE — Anesthesia Preprocedure Evaluation (Signed)
Anesthesia Evaluation  Patient identified by MRN, date of birth, ID band Patient awake    Reviewed: Allergy & Precautions, NPO status , Patient's Chart, lab work & pertinent test results  Airway Mallampati: II  TM Distance: >3 FB Neck ROM: Full  Mouth opening: Limited Mouth Opening  Dental no notable dental hx. (+) Teeth Intact   Pulmonary neg pulmonary ROS, Current SmokerPatient did not abstain from smoking.,    Pulmonary exam normal breath sounds clear to auscultation       Cardiovascular Exercise Tolerance: Good hypertension, Normal cardiovascular examI Rhythm:Regular Rate:Normal  States was previously able to walk miles  Denies known cardiac issues  Smoker/EtoH pancreatits issues  Now profound anemia with ? SBO Gen Surg plan on exp lap  I think second unit is transfusing -last was 6.4/21.8   Neuro/Psych PSYCHIATRIC DISORDERS negative neurological ROS     GI/Hepatic Neg liver ROS, GERD  Medicated and Controlled,  Endo/Other  negative endocrine ROS  Renal/GU Renal disease  negative genitourinary   Musculoskeletal negative musculoskeletal ROS (+)   Abdominal   Peds negative pediatric ROS (+)  Hematology negative hematology ROS (+) anemia ,   Anesthesia Other Findings   Reproductive/Obstetrics negative OB ROS                             Anesthesia Physical Anesthesia Plan  ASA: IV  Anesthesia Plan: General   Post-op Pain Management:    Induction: Intravenous  PONV Risk Score and Plan: 1 and Ondansetron and Treatment may vary due to age or medical condition  Airway Management Planned: Oral ETT  Additional Equipment:   Intra-op Plan:   Post-operative Plan: Extubation in OR  Informed Consent: I have reviewed the patients History and Physical, chart, labs and discussed the procedure including the risks, benefits and alternatives for the proposed anesthesia with the patient or  authorized representative who has indicated his/her understanding and acceptance.     Dental advisory given  Plan Discussed with: CRNA  Anesthesia Plan Comments: (Plan Full PPE use Plan GETA D/W PT -WTP with same after Q&A)        Anesthesia Quick Evaluation

## 2019-04-14 NOTE — Progress Notes (Signed)
In room. Abdominal binder at bedside. Diamond earring x1, in pink denture cup, with pt label on lid, placed on nightstand in pt's room. Room ICU 1. Nurse made aware of abdominal binder and earring at bedside. Voiced understanding .

## 2019-04-14 NOTE — Progress Notes (Signed)
Hgb 11.7 result from [facility], given to Dr Hilaria Ota and Dr Constance Haw. Dr Constance Haw instructed no new labs for post transfusion needed. [place] RN notified in report. Voiced understanding.

## 2019-04-14 NOTE — Op Note (Signed)
[facility] Operative Note  04/14/19  Preoperative Diagnosis:  Closed loop small bowel obstruction    Postoperative Diagnosis: Same   Procedure(s) Performed:  Revision of gastrojejunostomy to Roux en Y gastroenterostomy    Surgeon: Lanell Matar. Constance Haw, MD   Assistants:  Aviva Signs, MD    Anesthesia: General endotracheal   Anesthesiologist: Lenice Llamas, MD    Specimens:  Gastrojejunostomy    Estimated Blood Loss: Minimal   Blood Replacement: None    Complications: None   Wound Class: Clean contaminated    Operative Indications:  David Miranda is a 33 yo with a history of gastric outlet obstruction s/p loop gastrojejunostomy approximately 10 days ago.  He presented to the hospital after being at home for several days with abdominal pain and vomiting, he was brought into the hospital for resuscitation due to a hypokalemic, hypochloremic metabolic alkalosis, and the CT scan was reviewed with concern for a closed loop obstruction of the bowel.  Given this he was taken to the OR after receiving resuscitation in the form of fluids and blood due to a drop in his hemoglobin that was likely somewhat dilutional in nature.  We discussed the risk of surgery including bleeding, infection, need for revision of the gastrojejunostomy, and the patient opted to proceed.   Findings: Twisting of the efferent limb of the loop gastrojejunostomy causing closed loop obstruction    Procedure: The patient was taken to the operating room and placed supine. General endotracheal anesthesia was induced. Intravenous antibiotics were administered per protocol.  A NG was already in place to decompress the stomach. The abdomen was prepared and draped in the usual sterile fashion.   The prior staples were removed, and the sutures were located and cut. This opened up the abdominal cavity. There was some woody fibrosis and inflammation from healing, but there were minimal adhesions overall.  The  bowel was viable and pink but dilated.  A wound protector was placed. On examination, the loop gastrojejunstomy efferent limb was twisted under and causing a closed loop obstruction of the distal bowel.  The afferent limb also also dilated due to this obstruction.    The loop had been made at approximately 40cm distal to the ligament of treitz, but with the dilation the afferent limb measured more like 60 cm distal from the ligament of treitz.  The efferent limb measured well over 160 cm to the ileocecal valve.  In order to correct this twisting, the gastrojejunostomy was resected with a 100 mm linear cutting stapler across the posterior wall of the stomach, and the jejunal afferent and efferent limbs were taken with 33mm linear cutting staplers.  The mesentery was divided with a ligasure.    The new roux limb / alimentary limb (160 cm to the ileocecal valve) was brought up in an antecolic fashion, and the limb was aligned on the posterior stomach below the prior resection staple line from the gastrojejunostomy.  A gastrotomy and enterotomy were made, and a side to side anastomosis was performed with a 75 mm linear cutting stapler, and the common enterotomy was closed with a TA 60 mm stapler (thick load).  The staple lines were oversewn with Lembert 3-0 Silk suture (including the staple line from the prior gastrojejunostomy resection).  We ensured that that new gastroenterostomy was wide and patent.    The mesentery was divided to allow for the biliopancreatic limb to be anastomosed approximately 30cm distal to the gastroenterostomy in the side to side fashion using  a 75 mm linear cutting stapler. Due to concern for narrowing the alimentary limb, the enterotomy was closed with a running Connell 2-0 Chromic gut. This was carefully oversewn with 3-0 Lembert silk suture to avoid narrowing the lumen.    The jejunal mesenteric defect was closed with 2-0 Chromic gut suture. The bowel was placed back into the  abdomen in proper alignment. The NG was replaced with a larger caliber and verified in the stomach.  The anastomosis were under no tension.    The fascia was closed with running 0 PDS and the skin was closed with staples. A sterile dressing was applied.   Dr. Arnoldo Morale was assisting throughout the procedure and was present for the critical portions of the case.   Final inspection revealed acceptable hemostasis. All counts were correct at the end of the case. The patient was awakened from anesthesia and extubated without complication.  The patient went to the PACU in stable condition.   Curlene Labrum, MD [facility] [address] [place], [place] [address] [phone] (office)

## 2019-04-14 NOTE — Progress Notes (Signed)
CRITICAL VALUE ALERT  Critical Value: Hgb  Date & Time Notied:  11/9 0605  Provider Notified: Arnoldo Morale  Orders Received/Actions taken: pending orders

## 2019-04-14 NOTE — Progress Notes (Signed)
Peripherally Inserted Central Catheter/Midline Placement  The IV Nurse has discussed with the patient and/or persons authorized to consent for the patient, the purpose of this procedure and the potential benefits and risks involved with this procedure.  The benefits include less needle sticks, lab draws from the catheter, and the patient may be discharged home with the catheter. Risks include, but not limited to, infection, bleeding, blood clot (thrombus formation), and puncture of an artery; nerve damage and irregular heartbeat and possibility to perform a PICC exchange if needed/ordered by physician.  Alternatives to this procedure were also discussed.  Bard Power PICC patient education guide, fact sheet on infection prevention and patient information card has been provided to patient /or left at bedside.    PICC/Midline Placement Documentation  PICC Double Lumen 04/14/19 PICC Right Brachial 42 cm 1 cm (Active)  Indication for Insertion or Continuance of Line Administration of hyperosmolar/irritating solutions (i.e. TPN, Vancomycin, etc.) 04/14/19 0900  Exposed Catheter (cm) 1 cm 04/14/19 0900  Site Assessment Clean;Dry;Intact 04/14/19 0900  Lumen #1 Status Flushed;Saline locked;Blood return noted 04/14/19 0900  Lumen #2 Status Flushed;Saline locked;Blood return noted 04/14/19 0900  Dressing Type Transparent;Securing device 04/14/19 0900  Dressing Status Clean;Dry;Intact;Antimicrobial disc in place 04/14/19 0900  Dressing Change Due 04/21/19 04/14/19 0900       Holley Bouche [name] 04/14/2019, 9:51 AM

## 2019-04-14 NOTE — Progress Notes (Addendum)
[facility] Progress Note     Subjective: Doing fair. Having abdominal pain and says he feels like he has a bubble in his stomach. Burping and indigestion. No BMs reported. Tells me he fell on Saturday over a ramp. He chipped his tooth and passed out. He says that since then he had abdominal pain and prior had been doing well and only had soreness. He says that he had been having BMs prior to that daily. He reports that after the fall he was unable to drink due to the chipped tooth, and only started drinking from a straw before coming to the ED.    H&H down this AM. No signs of bleeding, no reports of black stools. Vitals stable. Has received one unit of blood.  Patient refused NG placement all day yesterday. I have repeatedly asked him this am, and after hurricane spray he did allow me to place an NG tube down.   Objective: Vital signs in last 24 hours: Temp:  [97.8 F (36.6 C)-98.2 F (36.8 C)] 97.8 F (36.6 C) (11/09 0400) Pulse Rate:  [52-83] 59 (11/09 0700) Resp:  [11-23] 13 (11/09 0700) BP: (76-120)/(36-82) 98/65 (11/09 0700) SpO2:  [88 %-100 %] 100 % (11/09 0700) Weight:  [65.6 kg-67.4 kg] 67.4 kg (11/09 0500)    Intake/Output from previous day: 11/08 0701 - 11/09 0700 In: 2821.6 [I.V.:2095.6; IV Piggyback:726] Out: 100 [Urine:100] Intake/Output this shift: No intake/output data recorded.  General appearance: alert, cooperative and no distress Resp: normal work of breathing GI: soft, distended, tender to palpation, no rebound or guarding Extremities: extremities normal, atraumatic, no cyanosis or edema  Lab Results:  Recent Labs    04/13/19 0130 04/14/19 0411  WBC 16.7* 9.6  HGB 10.2* 6.4*  HCT 32.6* 21.8*  PLT 836* 624*   BMET Recent Labs    04/13/19 0130 04/14/19 0411  NA 134* 135  K 2.8* 3.4*  CL 83* 104  CO2 35* 27  GLUCOSE 147* 90  BUN 18 11  CREATININE 1.05 0.94  CALCIUM 10.1 8.0*   Reviewed with radiology, Dr. Darrold Span  for  Mesenteric swirl and decompressed bowel distal and proximal, worried about a closed loop obstruction    Studies/Results: Ct Abdomen Pelvis W Contrast  Result Date: 04/13/2019 CLINICAL DATA:  Eighty-a of endo contact cough radiology arm hallucis con about this [place] CT on this CT EXAM: CT ABDOMEN AND PELVIS WITH CONTRAST TECHNIQUE: Multidetector CT imaging of the abdomen and pelvis was performed using the standard protocol following bolus administration of intravenous contrast. CONTRAST:  162mL OMNIPAQUE IOHEXOL 300 MG/ML  SOLN COMPARISON:  Prior CT from 03/18/2019. FINDINGS: Lower chest: Mild hazy subsegmental atelectatic changes seen dependently within the visualized lung bases. Visualized lungs are otherwise clear. Hepatobiliary: Liver demonstrates a normal contrast enhanced appearance. Gallbladder within normal limits. No biliary dilatation. Pancreas: 18 mm pseudocyst at the pancreatic body again noted, stable. No acute peripancreatic inflammation or abnormal pancreatic ductal dilatation. Spleen: Spleen within normal limits. Adrenals/Urinary Tract: Adrenal glands are normal. Kidneys equal in size with symmetric enhancement. 15 mm simple cyst present within the interpolar left kidney. No nephrolithiasis, hydronephrosis, or focal enhancing renal mass. No hydroureter. Partially distended bladder within normal limits. Stomach/Bowel: Prominent circumferential wall thickening seen about the visualized esophagus, improved from previous. Moderate gastric distension with prominent fluid within the gastric lumen, also improved relative to preoperative exam. Postoperative changes from interval gastrojejunostomy for gastric outlet obstruction. Multiple prominent and dilated loops of small bowel seen throughout the mid  and left abdomen, measuring up to 3.8 cm in diameter. Associated internal air-fluid levels. There is a suspected transition point within the left abdomen, although exact transition point somewhat  difficult to localize. There is moderate to large volume free intraperitoneal air within the abdomen, felt to be greater than normal expected postoperative changes, concerning for perforated viscus. There is focal pneumatosis seen about a short segment loop of bowel within the left abdomen, which could reflect slight of perforation (series 5, image 50). No portal venous gas. Ileum is decompressed distally. Appendix within normal limits. Colon is diffusely decompressed with scattered retained barium contrast within the colonic lumen. Vascular/Lymphatic: Normal intravascular enhancement seen throughout the intra-abdominal aorta. Mesenteric vessels patent proximally. No adenopathy. Reproductive: Prostate within normal limits. Other: Moderate volume free fluid within the pelvis, likely reactive. Musculoskeletal: No acute osseous abnormality. No discrete lytic or blastic osseous lesions. IMPRESSION: 1. Postoperative changes from recent gastrojejunostomy for gastric outlet obstruction, with multiple dilated loops of small bowel within the left and mid abdomen, concerning for small bowel obstruction. Large volume free air within the abdomen, greater than expected for normal postoperative changes, concerning for perforated viscus. Exact location of perforation is not definitely identified, however, the dilated small bowel is suspected to be the likely source of free air. Emergent surgical consultation recommended. 2. Persistent but improved gastric distension status post gastrojejunostomy. 3. Persistent but improved distal esophageal wall thickening, which could reflect acute esophagitis and/or changes related to reflux or vomiting. 4. Unchanged 1.8 cm cystic lesion within the pancreas, likely pseudocyst. Follow-up recommendations as previously described. Critical Value/emergent results were called by telephone at the time of interpretation on 04/13/2019 at 4:40 am to Yaurel , who verbally acknowledged these  results. Electronically Signed   By: Jeannine Boga M.D.   On: 04/13/2019 05:07   Dg Abd 2 Views  Result Date: 04/13/2019 CLINICAL DATA:  Patient with free intraperitoneal air. Prior surgery. EXAM: ABDOMEN - 2 VIEW COMPARISON:  CT abdomen pelvis 04/13/2019 FINDINGS: Minimal left basilar atelectasis. Free intraperitoneal air demonstrated within the abdomen. Multiple dilated loops of small bowel are demonstrated within the left hemiabdomen measuring up to 5 cm. Small amount of residual oral contrast within the ascending and descending colon. Contrast within the urinary bladder. Contrast within the bilateral renal collecting systems. Overlying surgical staple line. IMPRESSION: Gaseous distended loops of small bowel within the left hemiabdomen which may represent obstruction. Persistent moderate volume pneumoperitoneum. Electronically Signed   By: Lovey Newcomer M.D.   On: 04/13/2019 09:06   Dg Abd 2 Views  Result Date: 04/13/2019 CLINICAL DATA:  Recent surgery with increasing abdominal pain and bloating. EXAM: ABDOMEN - 2 VIEW COMPARISON:  04/02/2019 FINDINGS: There are scattered loops of small bowel scattered throughout the abdomen measuring up to approximately 5.5 cm in diameter. Air-fluid levels are noted. There is a moderate volume of pneumoperitoneum. Skin staples project over the patient's midline abdomen. IMPRESSION: 1. Moderate to high-grade small bowel obstruction. 2. Moderate volume of pneumoperitoneum. Correlation for timing of the patient's recent surgery is recommended. If there is clinical concern for perforation, follow-up with CT is recommended. Electronically Signed   By: Constance Holster M.D.   On: 04/13/2019 01:31   [place] Ekg Site Rite  Result Date: 04/13/2019 If Site Rite image not attached, placement could not be confirmed due to current cardiac rhythm.   Anti-infectives: Anti-infectives (From admission, onward)   Start     Dose/Rate Route Frequency Ordered Stop   04/13/19 1400  piperacillin-tazobactam (ZOSYN) IVPB 3.375 g     3.375 g 12.5 mL/hr over 240 Minutes Intravenous Every 8 hours 04/13/19 0956     04/13/19 1200  piperacillin-tazobactam (ZOSYN) IVPB 3.375 g  Status:  Discontinued     3.375 g 12.5 mL/hr over 240 Minutes Intravenous Every 8 hours 04/13/19 0813 04/13/19 0956   04/13/19 0400  piperacillin-tazobactam (ZOSYN) IVPB 3.375 g     3.375 g 100 mL/hr over 30 Minutes Intravenous  Once 04/13/19 0357 04/13/19 0522      Assessment/Plan: Mr. Waszak is a 33 yo what looks like a closed loop obstruction on CT scan after a gastrojejunostomy. He has been doing well otherwise but continues to have pain. His vitals have been within normal limits, but he did have a drop in his hemoglobin which is partially dilutional. He is getting blood now.   -OR for ex lap, possible bowel resection, possible revision of the gastrojejunostomy   -Receiving first unit pRBC, will hang other once this finishes -Discussed with patient and mother Dory Larsen, and friend, Catina. Discussed with patient risk of bleeding, infection, risk of having dead or unhealthy bowel and needing bowel resection.     LOS: 1 day    Virl Cagey 04/14/2019

## 2019-04-14 NOTE — Anesthesia Procedure Notes (Signed)
Procedure Name: Intubation Date/Time: 04/14/2019 1:50 PM Performed by: Ollen Bowl, CRNA Pre-anesthesia Checklist: Patient identified, Patient being monitored, Timeout performed, Emergency Drugs available and Suction available Patient Re-evaluated:Patient Re-evaluated prior to induction Oxygen Delivery Method: Circle system utilized Preoxygenation: Pre-oxygenation with 100% oxygen Induction Type: IV induction Ventilation: Mask ventilation without difficulty Laryngoscope Size: Glidescope (S3) Grade View: Grade I Tube type: Oral Tube size: 7.0 mm Number of attempts: 1 Airway Equipment and Method: Stylet Placement Confirmation: ETT inserted through vocal cords under direct vision,  positive ETCO2 and breath sounds checked- equal and bilateral Secured at: 22 cm Tube secured with: Tape Dental Injury: Teeth and Oropharynx as per pre-operative assessment  Comments: Intubation by Amy Adams,CRNA

## 2019-04-14 NOTE — Progress Notes (Signed)
Spoke with staff about PICC placement made them aware that we will come place PICC today before TNA is due.

## 2019-04-14 NOTE — Progress Notes (Signed)
Pt has had softer BP's all shift. Last BP is 88/45 (58). Paged hospitalist who advised that the patient had been signed off under hospitalist care so I would need to page Dr. Arnoldo Morale. Paged Dr. Arnoldo Morale; waiting for call back.

## 2019-04-14 NOTE — TOC Initial Note (Signed)
Transition of Care [facility]) - Initial/Assessment Note    Patient Details  Name: David Miranda MRN: [id number] Date of Birth: 09-10-1985  [facility] of [facility]) CM/SW Contact:    Shade Flood, LCSW Phone Number: 04/14/2019, 11:40 AM  Clinical Narrative:                  Pt was recently hospitalized here and followed by TOC at that time. Pt does not have insurance. He was set up with [facility] for follow up medical care. He had an appointment scheduled for 11/5. Spoke with [facility] RN and was informed that pt had rescheduled the appointment for tomorrow. Appointment now rescheduled by this TOC for Tuesday next week, 11/17, at 11:00. Will add to pt's AVS.   Pt stays with his family and had been independent in ADLs.   Will follow and assist as needed.  Expected Discharge Plan: Home/Self Care Barriers to Discharge: Continued Medical Work up   Patient Goals and CMS Choice        Expected Discharge Plan and Services Expected Discharge Plan: Home/Self Care In-house Referral: Clinical Social Work     Living arrangements for the past 2 months: Single Family Home                                      Prior Living Arrangements/Services Living arrangements for the past 2 months: Single Family Home Lives with:: Parents Patient language and need for interpreter reviewed:: Yes Do you feel safe going back to the place where you live?: Yes      Need for Family Participation in Patient Care: No (Comment) Care giver support system in place?: Yes (comment)   Criminal Activity/Legal Involvement Pertinent to Current Situation/Hospitalization: No - Comment as needed  Activities of Daily Living Home Assistive Devices/Equipment: None ADL Screening (condition at time of admission) Patient's cognitive ability adequate to safely complete daily activities?: Yes Is the patient deaf or have difficulty hearing?: No Does the patient have difficulty seeing, even when wearing  glasses/contacts?: No Does the patient have difficulty concentrating, remembering, or making decisions?: No Patient able to express need for assistance with ADLs?: No Does the patient have difficulty dressing or bathing?: No Independently performs ADLs?: Yes (appropriate for developmental age) Does the patient have difficulty walking or climbing stairs?: No Weakness of Legs: None Weakness of Arms/Hands: None  Permission Sought/Granted                  Emotional Assessment Appearance:: Appears stated age     Orientation: : Oriented to Self, Oriented to Place, Oriented to  Time, Oriented to Situation Alcohol / Substance Use: Tobacco Use, Alcohol Use Psych Involvement: No (comment)  Admission diagnosis:  Bowel perforation (H[facility]) [K63.1] Gastric outlet obstruction [K31.1] SBO (small bowel obstruction) ([place]) [K56.609] Abdominal pain [R10.9] Patient Active Problem List   Diagnosis Date Noted  . Peritonitis ([place]) 04/13/2019  . Gastric outlet obstruction   . Abdominal pain   . GI bleed 03/31/2019  . Emesis, persistent 03/30/2019  . Esophagitis 03/30/2019  . Pancreatic pseudocyst/cyst--alcoholic pancreatitis [id number]  . Acute necrotizing pancreatitis 07/24/2018  . Cannabis abuse 03/27/2018  . Acute kidney injury ([place])   . Hypomagnesemia   . Acute on chronic pancreatitis ([place]) 03/26/2018  . GERD (gastroesophageal reflux disease) 03/26/2018  . Acute renal failure (ARF) ([place]) 03/26/2018  . Hypokalemia 02/27/2018  . Essential hypertension 02/27/2018  . Tobacco  abuse 02/27/2018  . Acute pancreatitis 02/27/2018  . Constipation   . Annular pancreas   . Pancreatitis   . Non-intractable vomiting with nausea   . Hepatic steatosis   . 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 Leukocytosis 12/27/2017  . Abdominal pain, epigastric 12/27/2017  . Acute alcoholic pancreatitis 0000000  . Alcohol abuse 12/26/2017   PCP:  Patient, No Pcp Per Pharmacy:   [facility] [address], [place] - [address] [place] [address] Phone: [phone] Fax: [phone]     Social Determinants of Health (SDOH) Interventions    Readmission Risk Interventions Readmission Risk Prevention Plan 04/14/2019 04/08/2019  Transportation Screening Complete Complete  PCP or Specialist Appt within 5-7 Days - Complete  Home Care Screening - Complete  Medication Review (RN CM) - Complete  Palliative Care Screening Not Applicable -  Medication Review (RN Care Manager) Complete -  Some recent data might be hidden

## 2019-04-14 NOTE — Transfer of Care (Signed)
Immediate Anesthesia Transfer of Care Note  Patient: David Miranda  Procedure(s) Performed: REVISION GASTROJEJUNOSTOMY TO ROUX-EN-Y  Patient Location: PACU  Anesthesia Type:General  Level of Consciousness: drowsy and patient cooperative  Airway & Oxygen Therapy: Patient Spontanous Breathing and Patient connected to face mask oxygen  Post-op Assessment: Report given to RN and Post -op Vital signs reviewed and stable  Post vital signs: Reviewed and stable  Last Vitals:  Vitals Value Taken Time  BP 122/76 04/14/19 1615  Temp    Pulse 91 04/14/19 1621  Resp 17 04/14/19 1621  SpO2 97 % 04/14/19 1621  Vitals shown include unvalidated device data.  Last Pain:  Vitals:   04/14/19 1312  TempSrc: Oral  PainSc: 8       Patients Stated Pain Goal: 0 ([id number])  Complications: No apparent anesthesia complications

## 2019-04-15 ENCOUNTER — Ambulatory Visit: Payer: Self-pay | Admitting: General Surgery

## 2019-04-15 LAB — CBC WITH DIFFERENTIAL/PLATELET
Abs Immature Granulocytes: 0.26 10*3/uL — ABNORMAL HIGH (ref 0.00–0.07)
Basophils Absolute: 0.1 10*3/uL (ref 0.0–0.1)
Basophils Relative: 1 %
Eosinophils Absolute: 0.3 10*3/uL (ref 0.0–0.5)
Eosinophils Relative: 1 %
HCT: 35 % — ABNORMAL LOW (ref 39.0–52.0)
Hemoglobin: 11.1 g/dL — ABNORMAL LOW (ref 13.0–17.0)
Immature Granulocytes: 1 %
Lymphocytes Relative: 11 %
Lymphs Abs: 2.9 10*3/uL (ref 0.7–4.0)
MCH: 24.1 pg — ABNORMAL LOW (ref 26.0–34.0)
MCHC: 31.7 g/dL (ref 30.0–36.0)
MCV: 76.1 fL — ABNORMAL LOW (ref 80.0–100.0)
Monocytes Absolute: 1.3 10*3/uL — ABNORMAL HIGH (ref 0.1–1.0)
Monocytes Relative: 5 %
Neutro Abs: 20.8 10*3/uL — ABNORMAL HIGH (ref 1.7–7.7)
Neutrophils Relative %: 81 %
Platelets: 665 10*3/uL — ABNORMAL HIGH (ref 150–400)
RBC: 4.6 MIL/uL (ref 4.22–5.81)
RDW: 20.5 % — ABNORMAL HIGH (ref 11.5–15.5)
WBC: 25.7 10*3/uL — ABNORMAL HIGH (ref 4.0–10.5)
nRBC: 0 % (ref 0.0–0.2)

## 2019-04-15 LAB — BASIC METABOLIC PANEL
Anion gap: 8 (ref 5–15)
BUN: 9 mg/dL (ref 6–20)
CO2: 22 mmol/L (ref 22–32)
Calcium: 7.8 mg/dL — ABNORMAL LOW (ref 8.9–10.3)
Chloride: 108 mmol/L (ref 98–111)
Creatinine, Ser: 0.95 mg/dL (ref 0.61–1.24)
GFR calc Af Amer: >60 mL/min (ref >60)
GFR calc non Af Amer: >60 mL/min (ref >60)
Glucose, Bld: 129 mg/dL — ABNORMAL HIGH (ref 70–99)
Potassium: 3.7 mmol/L (ref 3.5–5.1)
Sodium: 138 mmol/L (ref 135–145)

## 2019-04-15 LAB — TYPE AND SCREEN
ABO/RH(D): B NEG
Antibody Screen: NEGATIVE
Unit division: 0
Unit division: 0

## 2019-04-15 LAB — BPAM RBC
Blood Product Expiration Date: 202011282359
Blood Product Expiration Date: 202012052359
ISSUE DATE / TIME: 202011091000
ISSUE DATE / TIME: 202011091149
Unit Type and Rh: 1700
Unit Type and Rh: 9500

## 2019-04-15 LAB — MAGNESIUM: Magnesium: 2.1 mg/dL (ref 1.7–2.4)

## 2019-04-15 LAB — PHOSPHORUS: Phosphorus: 4 mg/dL (ref 2.5–4.6)

## 2019-04-15 MED ORDER — LORAZEPAM 2 MG/ML IJ SOLN
0.0000 mg | Freq: Two times a day (BID) | INTRAMUSCULAR | Status: AC
  Administered 2019-04-15: 2 mg via INTRAVENOUS
  Administered 2019-04-16: 1 mg via INTRAVENOUS
  Filled 2019-04-15 (×2): qty 1

## 2019-04-15 MED ORDER — ENOXAPARIN SODIUM 40 MG/0.4ML ~~LOC~~ SOLN
40.0000 mg | SUBCUTANEOUS | Status: DC
  Filled 2019-04-15: qty 0.4

## 2019-04-15 MED ORDER — POTASSIUM CHLORIDE 10 MEQ/100ML IV SOLN
10.0000 meq | INTRAVENOUS | Status: AC
  Administered 2019-04-15 (×3): 10 meq via INTRAVENOUS
  Filled 2019-04-15 (×3): qty 100

## 2019-04-15 MED ORDER — ORAL CARE MOUTH RINSE
15.0000 mL | Freq: Two times a day (BID) | OROMUCOSAL | Status: DC
  Administered 2019-04-15 – 2019-04-18 (×5): 15 mL via OROMUCOSAL

## 2019-04-15 MED ORDER — HALOPERIDOL LACTATE 5 MG/ML IJ SOLN
2.5000 mg | Freq: Four times a day (QID) | INTRAMUSCULAR | Status: DC | PRN
  Administered 2019-04-15: 2.5 mg via INTRAVENOUS
  Filled 2019-04-15: qty 1

## 2019-04-15 MED ORDER — LACTATED RINGERS IV SOLN
INTRAVENOUS | Status: DC
  Administered 2019-04-15 – 2019-04-16 (×3): via INTRAVENOUS

## 2019-04-15 MED ORDER — LORAZEPAM 2 MG/ML IJ SOLN
3.0000 mg | Freq: Once | INTRAMUSCULAR | Status: AC
  Administered 2019-04-15: 3 mg via INTRAVENOUS

## 2019-04-15 MED ORDER — MAGNESIUM SULFATE 2 GM/50ML IV SOLN
2.0000 g | Freq: Once | INTRAVENOUS | Status: AC
  Administered 2019-04-15: 2 g via INTRAVENOUS
  Filled 2019-04-15: qty 50

## 2019-04-15 MED ORDER — METOPROLOL TARTRATE 5 MG/5ML IV SOLN
5.0000 mg | Freq: Once | INTRAVENOUS | Status: AC
  Administered 2019-04-15: 02:00:00 5 mg via INTRAVENOUS
  Filled 2019-04-15: qty 5

## 2019-04-15 MED ORDER — CHLORHEXIDINE GLUCONATE 0.12 % MT SOLN
15.0000 mL | Freq: Two times a day (BID) | OROMUCOSAL | Status: DC
  Administered 2019-04-15 – 2019-04-18 (×5): 15 mL via OROMUCOSAL
  Filled 2019-04-15 (×6): qty 15

## 2019-04-15 MED ORDER — HALOPERIDOL LACTATE 5 MG/ML IJ SOLN
5.0000 mg | Freq: Once | INTRAMUSCULAR | Status: AC
  Administered 2019-04-15: 5 mg via INTRAVENOUS
  Filled 2019-04-15: qty 1

## 2019-04-15 NOTE — Addendum Note (Signed)
Addendum  created 04/15/19 1102 by Mickel Baas, CRNA   Clinical Note Signed

## 2019-04-15 NOTE — Progress Notes (Signed)
Patient remained agitated, noncompliant with safety measures, and refused redirection attempts throughout the night. Patient pulling off monitor leads, pulling NG tube, and manipulating foley catheter throughout shift. Patient educated thoroughly on foley catheter, reasoning for placement, and function. Patient repositioned and guided multiple times. NG tube and PICC line secured. Surgical site bled a small amount due to movement, manipulation, and fighting staff. Patient was given pain medication as ordered and needed as well as anxiety medication to assist with comfort, restlessness, and anxiety. MD paged and visualized patient, ordering Haldol IV. All PRN medications were utilized and administered throughout the shift with little to no relief. Nurse and nurse tech were in patient's room nearly every half hour to guide, redirect, and safety sit. Report given to day nurse and surgeon contacted in regards to patient's behavior and noncompliance to safety measures set in place.

## 2019-04-15 NOTE — Progress Notes (Signed)
[facility] Progress Note  1 Day Post-Op  Subjective: Agitated overnight, pulling at NG and foley. Sleeping this Am but oriented to self, location and situation. Looks comfortable.   Objective: Vital signs in last 24 hours: Temp:  [97.4 F (36.3 C)-98.2 F (36.8 C)] 98 F (36.7 C) (11/10 0800) Pulse Rate:  [68-123] 91 (11/10 0900) Resp:  [14-39] 23 (11/10 0900) BP: (95-139)/(53-96) 116/70 (11/10 0900) SpO2:  [92 %-100 %] 96 % (11/10 0900) Weight:  NN:316265 kg] 69 kg (11/10 0430) Last BM Date: 04/14/19  Intake/Output from previous day: 11/09 0701 - 11/10 0700 In: 4997.2 [I.V.:3968; Blood:816.7; IV Piggyback:212.6] Out: 2802 [Urine:1600; Emesis/NG output:452; Blood:100] Intake/Output this shift: Total I/O In: 639.5 [I.V.:590; IV Piggyback:49.5] Out: -   General appearance: alert, cooperative and no distress Resp: normal work of breathing GI: soft, appropriately tender, nondistended, dressing midline with some staining, dried Extremities: extremities normal, atraumatic, no cyanosis or edema  Lab Results:  Recent Labs    04/14/19 0411 04/14/19 1635 04/15/19 0404  WBC 9.6  --  25.7*  HGB 6.4* 11.7* 11.1*  HCT 21.8*  --  35.0*  PLT 624*  --  665*   BMET Recent Labs    04/14/19 0411 04/15/19 0404  NA 135 138  K 3.4* 3.7  CL 104 108  CO2 27 22  GLUCOSE 90 129*  BUN 11 9  CREATININE 0.94 0.95  CALCIUM 8.0* 7.8*    Studies/Results: Dg Abd 1 View  Result Date: 04/14/2019 CLINICAL DATA:  Nasogastric tube placement. EXAM: ABDOMEN - 1 VIEW COMPARISON:  April 13, 2019. FINDINGS: Distal tip of nasogastric tube is seen in expected position of distal stomach. Stable dilated small bowel loops are noted concerning for distal small bowel obstruction. No colonic dilatation is noted. Residual contrast is noted in distal colon. Midline surgical staples are noted. IMPRESSION: Distal tip of nasogastric tube seen in distal stomach. Stable dilated small bowel loops  are noted concerning for distal small bowel obstruction. Electronically Signed   By: Marijo Conception M.D.   On: 04/14/2019 09:20   [place] Ekg Site Rite  Result Date: 04/13/2019 If Site Rite image not attached, placement could not be confirmed due to current cardiac rhythm.   Anti-infectives: Anti-infectives (From admission, onward)   Start     Dose/Rate Route Frequency Ordered Stop   04/14/19 1130  cefoTEtan (CEFOTAN) 2 g in sodium chloride 0.9 % 100 mL IVPB     2 g 200 mL/hr over 30 Minutes Intravenous On call to O.R. 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 Doing fair but has been agitated. CIWA protocol ordered, but he has said he has not been drinking. More likely related to  post op agitation from anesthesia.  PRN for pain with dilaudid  IS, OOB today to chair Awake and lights on during day to keep him more oriented  Can have afternoon nap NPO, NG in place, likely will have an ileus, if having bowel function will plan for UGI to assess the anastomosis on Thursday given need for revision, there were no signs of any leak in the surgery WBC 25, likely is related to post op stress but also covering immediate post op for spillage and need for redo anastomosis, will monitor  H&H  11/35 after 2 U pRBC yesterday for acute on chronic anemia related to chronic disease and dilution from resuscitation  UOP adequate, foley is bothering patient, will remove, K replaced, fluids changed to LR @ 75 and stopped NS @ 150 to avoid bowel swelling, etc  PICC in place from 11/9 SCDs, will start back lovenox as H&H is stable    LOS: 2 days    David Miranda 04/15/2019

## 2019-04-15 NOTE — Anesthesia Postprocedure Evaluation (Signed)
Anesthesia Post Note  Patient: David Miranda  Procedure(s) Performed: REVISION GASTROJEJUNOSTOMY TO ROUX-EN-Y  Patient location during evaluation: ICU Anesthesia Type: General Level of consciousness: awake (Restless and agitated throughout night per RN) Pain management: pain level controlled Vital Signs Assessment: post-procedure vital signs reviewed and stable Respiratory status: spontaneous breathing Cardiovascular status: stable Postop Assessment: no headache Anesthetic complications: no     Last Vitals:  Vitals:   04/15/19 0800 04/15/19 0900  BP: (!) 101/53 116/70  Pulse: 87 91  Resp: (!) 24 (!) 23  Temp: 36.7 C   SpO2: 95% 96%    Last Pain:  Vitals:   04/15/19 0800  TempSrc: Axillary  PainSc:                  Lya Holben A

## 2019-04-16 ENCOUNTER — Encounter (HOSPITAL_COMMUNITY): Payer: Self-pay | Admitting: General Surgery

## 2019-04-16 LAB — HEMOGLOBIN AND HEMATOCRIT, BLOOD
HCT: 28.1 % — ABNORMAL LOW (ref 39.0–52.0)
Hemoglobin: 8.7 g/dL — ABNORMAL LOW (ref 13.0–17.0)

## 2019-04-16 LAB — CBC WITH DIFFERENTIAL/PLATELET
Abs Immature Granulocytes: 0.12 10*3/uL — ABNORMAL HIGH (ref 0.00–0.07)
Basophils Absolute: 0.1 10*3/uL (ref 0.0–0.1)
Basophils Relative: 1 %
Eosinophils Absolute: 0.6 10*3/uL — ABNORMAL HIGH (ref 0.0–0.5)
Eosinophils Relative: 4 %
HCT: 26.9 % — ABNORMAL LOW (ref 39.0–52.0)
Hemoglobin: 8.4 g/dL — ABNORMAL LOW (ref 13.0–17.0)
Immature Granulocytes: 1 %
Lymphocytes Relative: 15 %
Lymphs Abs: 2.7 10*3/uL (ref 0.7–4.0)
MCH: 24.3 pg — ABNORMAL LOW (ref 26.0–34.0)
MCHC: 31.2 g/dL (ref 30.0–36.0)
MCV: 78 fL — ABNORMAL LOW (ref 80.0–100.0)
Monocytes Absolute: 1.3 10*3/uL — ABNORMAL HIGH (ref 0.1–1.0)
Monocytes Relative: 8 %
Neutro Abs: 12.7 10*3/uL — ABNORMAL HIGH (ref 1.7–7.7)
Neutrophils Relative %: 71 %
Platelets: 590 10*3/uL — ABNORMAL HIGH (ref 150–400)
RBC: 3.45 MIL/uL — ABNORMAL LOW (ref 4.22–5.81)
RDW: 22 % — ABNORMAL HIGH (ref 11.5–15.5)
WBC: 17.6 10*3/uL — ABNORMAL HIGH (ref 4.0–10.5)
nRBC: 0 % (ref 0.0–0.2)

## 2019-04-16 LAB — BASIC METABOLIC PANEL
Anion gap: 6 (ref 5–15)
BUN: 10 mg/dL (ref 6–20)
CO2: 24 mmol/L (ref 22–32)
Calcium: 8 mg/dL — ABNORMAL LOW (ref 8.9–10.3)
Chloride: 106 mmol/L (ref 98–111)
Creatinine, Ser: 1.19 mg/dL (ref 0.61–1.24)
GFR calc Af Amer: >60 mL/min (ref >60)
GFR calc non Af Amer: >60 mL/min (ref >60)
Glucose, Bld: 91 mg/dL (ref 70–99)
Potassium: 3.5 mmol/L (ref 3.5–5.1)
Sodium: 136 mmol/L (ref 135–145)

## 2019-04-16 LAB — MAGNESIUM: Magnesium: 2 mg/dL (ref 1.7–2.4)

## 2019-04-16 LAB — PHOSPHORUS: Phosphorus: 3.6 mg/dL (ref 2.5–4.6)

## 2019-04-16 LAB — SURGICAL PATHOLOGY

## 2019-04-16 MED ORDER — POTASSIUM CHLORIDE 10 MEQ/100ML IV SOLN
10.0000 meq | INTRAVENOUS | Status: AC
  Administered 2019-04-16 (×3): 10 meq via INTRAVENOUS
  Filled 2019-04-16 (×3): qty 100

## 2019-04-16 MED ORDER — ALUM & MAG HYDROXIDE-SIMETH 200-200-20 MG/5ML PO SUSP
30.0000 mL | ORAL | Status: DC | PRN
  Administered 2019-04-17 – 2019-04-18 (×4): 30 mL via ORAL
  Filled 2019-04-16 (×4): qty 30

## 2019-04-16 NOTE — Progress Notes (Signed)
Pharmacy Antibiotic Note  Galen Trabue is a 33 y.o. male admitted on 04/12/2019 with intra-abdominal infection.  Pharmacy has been consulted for zosyn dosing.  Plan: Continue Zosyn 3.375g IV every 8 hours. Monitor labs, c/s, and patient improvement.   Height: 6' (182.9 cm) Weight: 157 lb 13.6 oz (71.6 kg) IBW/kg (Calculated) : 77.6  Temp (24hrs), Avg:98.8 F (37.1 C), Min:98.4 F (36.9 C), Max:99 F (37.2 C)  Recent Labs  Lab 04/13/19 0130 04/13/19 0634 04/14/19 0411 04/15/19 0404 04/16/19 0445  WBC 16.7*  --  9.6 25.7* 17.6*  CREATININE 1.05  --  0.94 0.95 1.19  LATICACIDVEN 1.9 1.1  --   --   --     Estimated Creatinine Clearance: 90.3 mL/min (by C-G formula based on SCr of 1.19 mg/dL).    No Known Allergies  Antimicrobials this admission: zosyn 11/8 >>   Microbiology results: N/A  Thank you for allowing pharmacy to be a part of this patient's care.  Ramond Craver 04/16/2019 8:37 AM

## 2019-04-16 NOTE — Progress Notes (Signed)
[facility] Progress Note  2 Days Post-Op  Subjective: David Miranda was seen at bedside. No acute events were reported overnight, and he reports that he got some rest. He is tolerating the NG tube. He still reports pain and diffuse abdominal discomfort, but appears comfortable at rest and has been able to get up out of bed into the chair. He denies difficulty breathing. He had a BM yesterday.   Objective: Vital signs in last 24 hours: Temp:  [98.4 F (36.9 C)-99 F (37.2 C)] 98.7 F (37.1 C) (11/11 0748) Pulse Rate:  [88-117] 96 (11/11 0700) Resp:  [19-34] 23 (11/11 0700) BP: (87-157)/(53-79) 106/66 (11/11 0700) SpO2:  [89 %-100 %] 94 % (11/11 0700) Weight:  [71.6 kg] 71.6 kg (11/11 0500) Last BM Date: 04/15/19  Intake/Output from previous day: 11/10 0701 - 11/11 0700 In: 1960.7 [I.V.:1542.1; IV Piggyback:418.6] Out: 1 [Stool:1] Intake/Output this shift: Total I/O In: 20 [I.V.:20] Out: -   General appearance: alert, cooperative and fatigued. Resp: clear to auscultation bilaterally and no increased WOB Cardio: regular rate and rhythm GI: Bowel sounds present in all four quadrants, diffusely tender to palpation Extremities: atraumatic, no cyanosis or edema Incision/Wound: Honeycomb and staples in place. Some stained, dried blood that appears unchanged from yesterday.  Lab Results:  Recent Labs    04/15/19 0404 04/16/19 0445  WBC 25.7* 17.6*  HGB 11.1* 8.4*  HCT 35.0* 26.9*  PLT 665* 590*   BMET Recent Labs    04/15/19 0404 04/16/19 0445  NA 138 136  K 3.7 3.5  CL 108 106  CO2 22 24  GLUCOSE 129* 91  BUN 9 10  CREATININE 0.95 1.19  CALCIUM 7.8* 8.0*   PT/INR No results for input(s): LABPROT, INR in the last 72 hours.  Studies/Results: No results found.  Anti-infectives: Anti-infectives (From admission, onward)   Start     Dose/Rate Route Frequency Ordered Stop   04/14/19 1130  cefoTEtan (CEFOTAN) 2 g in sodium chloride 0.9 % 100 mL IVPB      2 g 200 mL/hr over 30 Minutes Intravenous On call to O.R. 04/14/19 1128 04/14/19 1700   04/13/19 1400  piperacillin-tazobactam (ZOSYN) IVPB 3.375 g     3.375 g 12.5 mL/hr over 240 Minutes Intravenous Every 8 hours 04/13/19 0956     04/13/19 1200  piperacillin-tazobactam (ZOSYN) IVPB 3.375 g  Status:  Discontinued     3.375 g 12.5 mL/hr over 240 Minutes Intravenous Every 8 hours 04/13/19 0813 04/13/19 0956   04/13/19 0400  piperacillin-tazobactam (ZOSYN) IVPB 3.375 g     3.375 g 100 mL/hr over 30 Minutes Intravenous  Once 04/13/19 0357 04/13/19 0522      Assessment/Plan: Mr. 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  - Dilaudid PRN for pain - CIWA protocol with ativan for possible alcohol witdrawal, though patient states that he has not been drinking since before his last surgery.  - 14mg  nicotine patch given tobacco use - OOB in chair, ambulating, IS to promote respiratory function. - Continue NPO with NG tube. Will get UGI tomorrow to assess anastomosis given the need for revision.  - Foley out. Given K 3.5, will replete.  - WBC down to 17.6 from 25.7 yesterday, consistent with intraabdominal infection responding to antibiotics vs. resolving postoperative stress reaction. Per pharmacy's recommendation, continue IV zosyn 3.375g q8h until  labs/patient status improve. - H&H down to 8.4 and 26.9 from 11.1 and 35.0 yesterday. Will recheck H&H.  - Continue SCDs for ppx. Holding Lovenox given low H&H value this morning.    LOS: 3 days    Alanda Slim 04/16/2019

## 2019-04-16 NOTE — Progress Notes (Signed)
[facility]  H&H stable. Will hold lovenox for today. No signs of active bleeding. Monitor.  Curlene Labrum, MD [facility] [address] [place], [place] [address] [phone] (office)

## 2019-04-17 ENCOUNTER — Inpatient Hospital Stay (HOSPITAL_COMMUNITY): Payer: Self-pay

## 2019-04-17 LAB — CBC WITH DIFFERENTIAL/PLATELET
Abs Immature Granulocytes: 0.11 10*3/uL — ABNORMAL HIGH (ref 0.00–0.07)
Basophils Absolute: 0.1 10*3/uL (ref 0.0–0.1)
Basophils Relative: 1 %
Eosinophils Absolute: 1 10*3/uL — ABNORMAL HIGH (ref 0.0–0.5)
Eosinophils Relative: 7 %
HCT: 25.8 % — ABNORMAL LOW (ref 39.0–52.0)
Hemoglobin: 7.9 g/dL — ABNORMAL LOW (ref 13.0–17.0)
Immature Granulocytes: 1 %
Lymphocytes Relative: 15 %
Lymphs Abs: 2.1 10*3/uL (ref 0.7–4.0)
MCH: 23.5 pg — ABNORMAL LOW (ref 26.0–34.0)
MCHC: 30.6 g/dL (ref 30.0–36.0)
MCV: 76.8 fL — ABNORMAL LOW (ref 80.0–100.0)
Monocytes Absolute: 1 10*3/uL (ref 0.1–1.0)
Monocytes Relative: 7 %
Neutro Abs: 9.6 10*3/uL — ABNORMAL HIGH (ref 1.7–7.7)
Neutrophils Relative %: 69 %
Platelets: 637 10*3/uL — ABNORMAL HIGH (ref 150–400)
RBC: 3.36 MIL/uL — ABNORMAL LOW (ref 4.22–5.81)
RDW: 21.9 % — ABNORMAL HIGH (ref 11.5–15.5)
WBC: 13.9 10*3/uL — ABNORMAL HIGH (ref 4.0–10.5)
nRBC: 0 % (ref 0.0–0.2)

## 2019-04-17 LAB — BASIC METABOLIC PANEL
Anion gap: 12 (ref 5–15)
BUN: 7 mg/dL (ref 6–20)
CO2: 22 mmol/L (ref 22–32)
Calcium: 8.2 mg/dL — ABNORMAL LOW (ref 8.9–10.3)
Chloride: 101 mmol/L (ref 98–111)
Creatinine, Ser: 0.77 mg/dL (ref 0.61–1.24)
GFR calc Af Amer: >60 mL/min (ref >60)
GFR calc non Af Amer: >60 mL/min (ref >60)
Glucose, Bld: 78 mg/dL (ref 70–99)
Potassium: 3.1 mmol/L — ABNORMAL LOW (ref 3.5–5.1)
Sodium: 135 mmol/L (ref 135–145)

## 2019-04-17 MED ORDER — OXYCODONE HCL 5 MG PO TABS
5.0000 mg | ORAL_TABLET | ORAL | Status: DC | PRN
  Administered 2019-04-17: 5 mg via ORAL
  Administered 2019-04-17 – 2019-04-18 (×5): 10 mg via ORAL
  Filled 2019-04-17: qty 2
  Filled 2019-04-17: qty 1
  Filled 2019-04-17 (×4): qty 2

## 2019-04-17 MED ORDER — HYDROMORPHONE HCL 1 MG/ML IJ SOLN
1.0000 mg | INTRAMUSCULAR | Status: DC | PRN
  Administered 2019-04-17 – 2019-04-18 (×7): 1 mg via INTRAVENOUS
  Filled 2019-04-17 (×7): qty 1

## 2019-04-17 MED ORDER — LORAZEPAM 2 MG/ML IJ SOLN
2.0000 mg | Freq: Four times a day (QID) | INTRAMUSCULAR | Status: DC | PRN
  Administered 2019-04-17 – 2019-04-18 (×3): 2 mg via INTRAVENOUS
  Filled 2019-04-17 (×3): qty 1

## 2019-04-17 MED ORDER — IOPAMIDOL (ISOVUE-370) INJECTION 76%
INTRAVENOUS | Status: AC
  Administered 2019-04-17: 200 mL via NASOGASTRIC
  Filled 2019-04-17: qty 400

## 2019-04-17 MED ORDER — POTASSIUM CHLORIDE 10 MEQ/100ML IV SOLN
10.0000 meq | INTRAVENOUS | Status: AC
  Administered 2019-04-17 (×4): 10 meq via INTRAVENOUS
  Filled 2019-04-17 (×4): qty 100

## 2019-04-17 NOTE — Progress Notes (Signed)
[facility] Progress Note  3 Days Post-Op  Subjective: David Miranda was seen at bedside. He reports no acute events overnight, and is doing fairly well. He says that the NG tube is increasingly uncomfortable and he would like it removed. He also reports continued pain at his incision site as well as diffuse abdominal pain when he hiccups or coughs.  He continues to urinate and have bowel movements in the commode, and reports well-formed stools. He has been out of bed in the chair and ambulating around the unit.  Objective: Vital signs in last 24 hours: Temp:  [98.4 F (36.9 C)-99 F (37.2 C)] 99 F (37.2 C) (11/12 0731) Pulse Rate:  [87-103] 88 (11/12 0731) Resp:  [12-33] 22 (11/12 0731) BP: (96-115)/(65-88) 109/70 (11/12 0600) SpO2:  [91 %-100 %] 100 % (11/12 0731) Weight:  [72.5 kg] 72.5 kg (11/12 0500) Last BM Date: 04/16/19  Intake/Output from previous day: 11/11 0701 - 11/12 0700 In: 2808.7 [I.V.:2603.4; IV Piggyback:205.3] Out: 600 [Urine:600] Intake/Output this shift: No intake/output data recorded.  Physical Exam: General appearance: alert, cooperative and no distress Resp: clear to auscultation bilaterally. No increased work of breathing Cardio: regular rate and rhythm GI: diffusely tender to palpation, bowel sounds present in all four quadrants. Extremities: extremities normal, atraumatic, no cyanosis or edema Incision/Wound: Tender to palpation. Honeycomb and staples intact with. Some dried blood that appears unchanged from yesterday; otherwise clean and dry with no drainage.   Lab Results:  Recent Labs    04/16/19 0445 04/16/19 1045 04/17/19 0411  WBC 17.6*  --  13.9*  HGB 8.4* 8.7* 7.9*  HCT 26.9* 28.1* 25.8*  PLT 590*  --  637*   BMET Recent Labs    04/16/19 0445 04/17/19 0411  NA 136 135  K 3.5 3.1*  CL 106 101  CO2 24 22  GLUCOSE 91 78  BUN 10 7  CREATININE 1.19 0.77  CALCIUM 8.0* 8.2*   PT/INR No results for input(s):  LABPROT, INR in the last 72 hours.  Studies/Results: UGI Series with fluoroscopy showed:  - Expected postoperative changes with patent gastrojejunostomy anastomosis. - Bowel wall thickening of jejunum at the gastrojejunostomy consistent with surgery. - Bowel wall thickening and mild dilatation of proximal to mid small bowel loops likely ribs and postoperative change and postoperative ileus. - No evidence of obstruction, with contrast passing beyond jejunojejunostomy anastomosis into colon by 1.75.  Anti-infectives: Anti-infectives (From admission, onward)   Start     Dose/Rate Route Frequency Ordered Stop   04/14/19 1130  cefoTEtan (CEFOTAN) 2 g in sodium chloride 0.9 % 100 mL IVPB     2 g 200 mL/hr over 30 Minutes Intravenous On call to O.R. 04/14/19 1128 04/14/19 1700   04/13/19 1400  piperacillin-tazobactam (ZOSYN) IVPB 3.375 g     3.375 g 12.5 mL/hr over 240 Minutes Intravenous Every 8 hours 04/13/19 0956     04/13/19 1200  piperacillin-tazobactam (ZOSYN) IVPB 3.375 g  Status:  Discontinued     3.375 g 12.5 mL/hr over 240 Minutes Intravenous Every 8 hours 04/13/19 0813 04/13/19 0956   04/13/19 0400  piperacillin-tazobactam (ZOSYN) IVPB 3.375 g     3.375 g 100 mL/hr over 30 Minutes Intravenous  Once 04/13/19 0357 04/13/19 0522      Assessment/Plan: David Miranda is a 33 year old man with PMH acut on chronic pancreatitis complicated by GOO now POD2 from revision of gastrojejenostomy to Roux-en-y. He is doing well today.  - Decreasing frequency of IV  dilaudid and adding oral oxycodone for pain - CIWA protocol with ativan for potential alcohol withdrawal, though patient states that he has not been drinking since his last surgery. - OOB, ambulating.  - Urinating in commode, seems to have appropriate output though not all recorded. - Replete K given value of 3.1 this morning.  - Having well-formed BMs. Given UGI series showed no patent anastomoses with no evidence of obstruction,  will advance diet to thin liquids.  - Hemoglobin down to 7.9 from 8.4 yesterday morning. No obvious source of bleeding, not symptomatic. - WBC continues to trend down consistent with resolving inflammatory/stress reaction or infection. Will discontinue IV Zosyn. - SCDs for DVT ppx, continue holding Lovenox given drifting H&H.   LOS: 4 days     Alanda Slim 04/17/2019

## 2019-04-18 LAB — CBC WITH DIFFERENTIAL/PLATELET
Abs Immature Granulocytes: 0.08 10*3/uL — ABNORMAL HIGH (ref 0.00–0.07)
Basophils Absolute: 0.1 10*3/uL (ref 0.0–0.1)
Basophils Relative: 1 %
Eosinophils Absolute: 0.9 10*3/uL — ABNORMAL HIGH (ref 0.0–0.5)
Eosinophils Relative: 9 %
HCT: 24.2 % — ABNORMAL LOW (ref 39.0–52.0)
Hemoglobin: 7.6 g/dL — ABNORMAL LOW (ref 13.0–17.0)
Immature Granulocytes: 1 %
Lymphocytes Relative: 24 %
Lymphs Abs: 2.4 10*3/uL (ref 0.7–4.0)
MCH: 23.9 pg — ABNORMAL LOW (ref 26.0–34.0)
MCHC: 31.4 g/dL (ref 30.0–36.0)
MCV: 76.1 fL — ABNORMAL LOW (ref 80.0–100.0)
Monocytes Absolute: 1 10*3/uL (ref 0.1–1.0)
Monocytes Relative: 10 %
Neutro Abs: 5.5 10*3/uL (ref 1.7–7.7)
Neutrophils Relative %: 55 %
Platelets: 664 10*3/uL — ABNORMAL HIGH (ref 150–400)
RBC: 3.18 MIL/uL — ABNORMAL LOW (ref 4.22–5.81)
RDW: 22 % — ABNORMAL HIGH (ref 11.5–15.5)
WBC: 10 10*3/uL (ref 4.0–10.5)
nRBC: 0 % (ref 0.0–0.2)

## 2019-04-18 LAB — BASIC METABOLIC PANEL
Anion gap: 8 (ref 5–15)
BUN: <5 mg/dL — ABNORMAL LOW (ref 6–20)
CO2: 24 mmol/L (ref 22–32)
Calcium: 7.9 mg/dL — ABNORMAL LOW (ref 8.9–10.3)
Chloride: 104 mmol/L (ref 98–111)
Creatinine, Ser: 0.72 mg/dL (ref 0.61–1.24)
GFR calc Af Amer: >60 mL/min (ref >60)
GFR calc non Af Amer: >60 mL/min (ref >60)
Glucose, Bld: 111 mg/dL — ABNORMAL HIGH (ref 70–99)
Potassium: 3.1 mmol/L — ABNORMAL LOW (ref 3.5–5.1)
Sodium: 136 mmol/L (ref 135–145)

## 2019-04-18 LAB — HEMOGLOBIN AND HEMATOCRIT, BLOOD
HCT: 32.3 % — ABNORMAL LOW (ref 39.0–52.0)
Hemoglobin: 10 g/dL — ABNORMAL LOW (ref 13.0–17.0)

## 2019-04-18 LAB — PREPARE RBC (CROSSMATCH)

## 2019-04-18 MED ORDER — PANCRELIPASE (LIP-PROT-AMYL) 12000-38000 UNITS PO CPEP
72000.0000 [IU] | ORAL_CAPSULE | Freq: Three times a day (TID) | ORAL | Status: DC
  Administered 2019-04-18 (×2): 72000 [IU] via ORAL
  Filled 2019-04-18 (×2): qty 6

## 2019-04-18 MED ORDER — OXYCODONE HCL 5 MG PO TABS: 5.0000 mg | ORAL_TABLET | ORAL | 0 refills | Status: AC | PRN

## 2019-04-18 MED ORDER — POTASSIUM CHLORIDE CRYS ER 20 MEQ PO TBCR
40.0000 meq | EXTENDED_RELEASE_TABLET | Freq: Once | ORAL | Status: AC
  Administered 2019-04-18: 40 meq via ORAL
  Filled 2019-04-18: qty 2

## 2019-04-18 MED ORDER — ADULT MULTIVITAMIN W/MINERALS CH
1.0000 | ORAL_TABLET | Freq: Every day | ORAL | Status: DC
  Administered 2019-04-18: 10:00:00 1 via ORAL
  Filled 2019-04-18: qty 1

## 2019-04-18 MED ORDER — SODIUM CHLORIDE 0.9% IV SOLUTION
Freq: Once | INTRAVENOUS | Status: AC
  Administered 2019-04-18: 14:00:00 via INTRAVENOUS

## 2019-04-18 NOTE — Progress Notes (Signed)
Pt was given his discharge instructions and informed that the house supervisor, Abby would come to his room to discontinue his PICC before he would be ready to leave. This RN was informed by staff that pt had left the department, stating, "my ride is waiting for me and I took out my IV." PICC line was found in the trash. MD and House supervisor made aware.

## 2019-04-18 NOTE — Progress Notes (Signed)
[facility]  Patient doing well. Having Bms and tolerating fulls. Wants to eat grits. Is walking around.  HR does get elevated with ambulation as yesterday when I saw him it was in the 110s going to the restroom.  H&H steadily drifting. WBC wnl.   BP (!) 95/55 (BP Location: Left Arm)   Pulse 69   Temp 98.7 F (37.1 C) (Oral)   Resp 18   Ht 6' (1.829 m)   Wt 69.5 kg   SpO2 97%   BMI 20.78 kg/m  NAD Normal work breathing Soft, nondistended, appropriately tender, staples intact with no erythema or drainage MAE  CBC Latest Ref Rng & Units 04/18/2019 04/17/2019 04/16/2019  WBC 4.0 - 10.5 K/uL 10.0 13.9(H) -  Hemoglobin 13.0 - 17.0 g/dL 7.6(L) 7.9(L) 8.7(L)  Hematocrit 39.0 - 52.0 % 24.2(L) 25.8(L) 28.1(L)  Platelets 150 - 400 K/uL 664(H) 637(H) -   POD 4 s/p  Revision gastrojejunostomy to Roux en Y for obstruction. Doing well.  PRN for pain IS, OOB Ambulate Soft diet Replaced K Going to give more blood for symptomatic anemia with tachycardia and weakness with walking  Home later today versus tomorrow SCDs  Curlene Labrum, MD [facility] [address] [place], [place] [address] [phone] (office)

## 2019-04-18 NOTE — Progress Notes (Signed)
Patient ready to leave floor to be discharged.  According to MD, patient to be discharged after H&H.  Patient very anxious, pacing floor.  Patient nurse gave patient discharge paper work.  Lab work had not resulted to ensure safe discharge.  Patient stated his ride was waiting downstairs.  Patient  Removed PICC line himself and left floor.  Picc line was in patient trash can, measuring 42cm, as was previously charted.

## 2019-04-18 NOTE — Discharge Instructions (Signed)
Discharge Open Abdominal Surgery Instructions:  Common Complaints: Pain at the incision site is common. This will improve with time. Take your pain medications as described below. Some nausea is common and poor appetite. The main goal is to stay hydrated the first few days after surgery.   Diet/ Activity: Soft diet, made up of soft foods that are easily chewed and digested.  Avoid a lot of carbonated beverages as these may cause you to bloat.  SMOKING IS BAD FOR YOUR CONNECTION. SMOKING CESSATION IS IMPORTANT TO PREVENT ULCER FORMATION.  You may not have a large appetite, but it is important to stay hydrated. Drink 64 ounces of water a day. Your appetite will return with time.  Keep a dry dressing in place over your staples as needed. Some minor pink/ blood tinged drainage is expected. This will stop in a few days after surgery.  Shower per your regular routine daily.  Do not take hot showers. Take warm showers that are less than 10 minutes.  Pat the incision dry. Wear an abdominal binder daily with activity. You do not have to wear this while sleeping or sitting.  Rest and listen to your body, but do not remain in bed all day.  Walk everyday for at least 15-20 minutes. Deep cough and move around every 1-2 hours in the first few days after surgery.  Do not lift > 10 lbs, perform excessive bending, pushing, pulling, squatting for 6-8 weeks after surgery.  The activity restrictions and the abdominal binder are to prevent hernia formation at your incision while you are healing.  Do not place lotions or balms on your incision unless instructed to specifically by Dr. Constance Haw.   Medication: Take Protonix twice daily to prevent ulcer formation around your connection between you stomach and your intestine.  Take tylenol scheduled for pain control.  DO NOT TAKE NSAID, GOODIES POWDER, IBUPROFEN. YOU ARE AT RISK FOR ULCERS IN YOUR STOMACH.  Example:  Tylenol 1000mg  @ 6am, 12noon, 6pm, 59midnight (Do  not exceed 4000mg  of tylenol a day).  Take Roxicodone for breakthrough pain every 4 hours.  Take Colace for constipation related to narcotic pain medication. If you do not have a bowel movement in 2 days, take Miralax over the counter.  Drink plenty of water to also prevent constipation.   Contact Information: If you have questions or concerns, please call our office, [phone], Monday- Thursday 8AM-5PM and Friday 8AM-12Noon.  If it is after hours or on the weekend, please call [facility], [phone], and ask to speak to the surgeon on call for Dr. Constance Haw at [facility].   [facility] Gastroenterostomy for Gastric Decompression, Care After This sheet gives you information about how to care for yourself after your procedure. Your health care provider may also give you more specific instructions. If you have problems or questions, contact your health care provider. What can I expect after the procedure? After the procedure, it is common to have:  Heartburn or diarrhea after eating. This usually goes away with time.  Pain or soreness in your stomach. Follow these instructions at home: Medicines  Take over-the-counter and prescription medicines only as told by your health care provider.  Ask your health care provider if the medicine prescribed to you: ? Requires you to avoid driving or using heavy machinery. ? Can cause constipation. You may need to take these actions to prevent or treat constipation:  Drink enough fluid to keep your urine pale yellow.  Take over-the-counter or  prescription medicines.  Eat soft foods that contain fiber, such as cooked vegetables and fruits.  Limit foods that are high in fat and processed sugars, such as fried or sweet foods. Eating and drinking   Follow instructions from your health care provider about eating or drinking restrictions. You may drink clear fluids as you start your recovery.  Eat small amounts of soft, easy-to-digest foods  throughout the day instead of large meals. Eating large meals can cause a condition called dumping syndrome, which causes cramps, nausea, dizziness, and discomfort.  Eat foods that have a lot of calcium, iron, vitamin C, and vitamin D.  Take vitamin supplements as told by your health care provider.  Keep a food diary to keep track of any foods that cause problems for you. Incision care   Follow instructions from your health care provider about how to take care of your incision or incisions. Make sure you: ? Wash your hands with soap and water before and after you change your bandage (dressing). If soap and water are not available, use hand sanitizer. ? Change your dressing as told by your health care provider. ? Leave stitches (sutures), skin glue, or adhesive strips in place. These skin closures may need to stay in place for 2 weeks or longer. If adhesive strip edges start to loosen and curl up, you may trim the loose edges. Do not remove adhesive strips completely unless your health care provider tells you to do that.  Check your incision areas every day for signs of infection. Check for: ? Redness, swelling, or pain. ? Fluid or blood. ? Warmth. ? Pus or a bad smell. Activity  Rest as told by your health care provider.  Avoid sitting for a long time without moving. Get up to take short walks every 1-2 hours. This is important to improve blood flow and breathing. Ask for help if you feel weak or unsteady.  Gradually add light activity to your daily routine.  Return to your normal activities as told by your health care provider. Ask your health care provider what activities are safe for you.  Do not drive until your health care provider tells you it is safe.  Do not lift anything that is heavier than 10 lb (4.5 kg), or the limit that you are told, until your health care provider says that it is safe. General instructions  Do not take baths, swim, or use a hot tub until your health  care provider approves.   You may shower.  Do not use any products that contain nicotine or tobacco, such as cigarettes, e-cigarettes, and chewing tobacco. If you need help quitting, ask your health care provider.  Do coughing and deep breathing exercises as instructed.  Keep all follow-up visits as told by your health care provider. This is important. Contact a health care provider if:  Any of these symptoms do not improve with medicine or treatment as told by your health care provider: ? Pain. ? Nausea and vomiting. ? Heartburn. ? Diarrhea.  You have pain, bloating, pressure, or cramping in your abdomen.  You become confused.  You feel light-headed.  You develop new symptoms.  You have redness, swelling, or pain around an incision.  You have a bad smell coming from an incision.  Your incision area feels warm to the touch.  You have a fever or chills.  Your stools do not become firmer over time. Get help right away if:  Your abdominal pain does not go away or  becomes severe.  You have fluid, blood, or pus coming from an incision.  You keep vomiting.  You develop an irregular heartbeat.  You develop chest pain or difficulty breathing.  There is swelling or pain in your legs, feet, or the back of your lower legs (calves). Summary  After this procedure, it is common to have heartburn or diarrhea after eating.  You will be told how to care for your incision or incisions, what to eat and drink, and what activities to avoid.  Take over-the-counter and prescription medicines only as told by your health care provider.  Keep all follow-up visits as told by your health care provider. This is important.  Get help right away if you have pain that does not go away, you keep vomiting, or you develop an irregular heartbeat. This information is not intended to replace advice given to you by your health care provider. Make sure you discuss any questions you have with your  health care provider. Document Released: 08/16/2009 Document Revised: 06/13/2018 Document Reviewed: 06/13/2018 Elsevier Patient Education  [place] A soft-food eating plan includes foods that are safe and easy to chew and swallow. Your health care provider or dietitian can help you find foods and flavors that fit into this plan. Follow this plan until your health care provider or dietitian says it is safe to start eating other foods and food textures. What are tips for following this plan? General guidelines   Take small bites of food, or cut food into pieces about  inch or smaller. Bite-sized pieces of food are easier to chew and swallow.  Eat moist foods. Avoid overly dry foods.  Avoid foods that: ? Are difficult to swallow, such as dry, chunky, crispy, or sticky foods. ? Are difficult to chew, such as hard, tough, or stringy foods. ? Contain nuts, seeds, or fruits.  Follow instructions from your dietitian about the types of liquids that are safe for you to swallow. You may be allowed to have: ? Thick liquids only. This includes only liquids that are thicker than honey. ? Thin and thick liquids. This includes all beverages and foods that become liquid at room temperature.  To make thick liquids: ? Purchase a commercial liquid thickening powder. These are available at grocery stores and pharmacies. ? Mix the thickener into liquids according to instructions on the label. ? Purchase ready-made thickened liquids. ? Thicken soup by pureeing, straining to remove chunks, and adding flour, potato flakes, or corn starch. ? Add commercial thickener to foods that become liquid at room temperature, such as milk shakes, yogurt, ice cream, gelatin, and sherbet.  Ask your health care provider whether you need to take a fiber supplement. Cooking  Cook meats so they stay tender and moist. Use methods like braising, stewing, or baking in liquid.  Cook  vegetables and fruit until they are soft enough to be mashed with a fork.  Peel soft, fresh fruits such as peaches, nectarines, and melons.  When making soup, make sure chunks of meat and vegetables are smaller than  inch.  Reheat leftover foods slowly so that a tough crust does not form. What foods are allowed? The items listed below may not be a complete list. Talk with your dietitian about what dietary choices are best for you. Grains Breads, muffins, pancakes, or waffles moistened with syrup, jelly, or butter. Dry cereals well-moistened with milk. Moist, cooked cereals. Well-cooked pasta and rice. Vegetables All soft-cooked vegetables. Shredded lettuce.  Fruits All canned and cooked fruits. Soft, peeled fresh fruits. Strawberries. Dairy Milk. Cream. Yogurt. Cottage cheese. Soft cheese without the rind. Meats and other protein foods Tender, moist ground meat, poultry, or fish. Meat cooked in gravy or sauces. Eggs. Sweets and desserts Ice cream. Milk shakes. Sherbet. Pudding. Fats and oils Butter. Margarine. Olive, canola, sunflower, and grapeseed oil. Smooth salad dressing. Smooth cream cheese. Mayonnaise. Gravy. What foods are not allowed? The items listed bemay not be a complete list. Talk with your dietitian about what dietary choices are best for you. Grains Coarse or dry cereals, such as bran, granola, and shredded wheat. Tough or chewy crusty breads, such as [place] bread or baguettes. Breads with nuts, seeds, or fruit. Vegetables All raw vegetables. Cooked corn. Cooked vegetables that are tough or stringy. Tough, crisp, fried potatoes and potato skins. Fruits Fresh fruits with skins or seeds, or both, such as apples, pears, and grapes. Stringy, high-pulp fruits, such as papaya, pineapple, coconut, and mango. Fruit leather and all dried fruit. Dairy Yogurt with nuts or coconut. Meats and other protein foods Hard, dry sausages. Dry meat, poultry, or fish. Meats with gristle.  Fish with bones. Fried meat or fish. Lunch meat and hotdogs. Nuts and seeds. Chunky peanut butter or other nut butters. Sweets and desserts Cakes or cookies that are very dry or chewy. Desserts with dried fruit, nuts, or coconut. Fried pastries. Very rich pastries. Fats and oils Cream cheese with fruit or nuts. Salad dressings with seeds or chunks. Summary  A soft-food eating plan includes foods that are safe and easy to swallow. Generally, the foods should be soft enough to be mashed with a fork.  Avoid foods that are dry, hard to chew, crunchy, sticky, stringy, or crispy.  Ask your health care provider whether you need to thicken your liquids and if you need to take a fiber supplement. This information is not intended to replace advice given to you by your health care provider. Make sure you discuss any questions you have with your health care provider. Document Released: 08/29/2007 Document Revised: 09/12/2018 Document Reviewed: 07/25/2016 Elsevier Patient Education  2020 [place].   Gastritis, Adult  Gastritis is swelling (inflammation) of the stomach. Gastritis can develop quickly (acute). It can also develop slowly over time (chronic). It is important to get help for this condition. If you do not get help, your stomach can bleed, and you can get sores (ulcers) in your stomach. What are the causes? This condition may be caused by:  Germs that get to your stomach.  Drinking too much alcohol.  Medicines you are taking.  Too much acid in the stomach.  A disease of the intestines or stomach.  Stress.  An allergic reaction.  Crohn's disease.  Some cancer treatments (radiation). Sometimes the cause of this condition is not known. What are the signs or symptoms? Symptoms of this condition include:  Pain in your stomach.  A burning feeling in your stomach.  Feeling sick to your stomach (nauseous).  Throwing up (vomiting).  Feeling too full after you  eat.  Weight loss.  Bad breath.  Throwing up blood.  Blood in your poop (stool). How is this diagnosed? This condition may be diagnosed with:  Your medical history and symptoms.  A physical exam.  Tests. These can include: ? Blood tests. ? Stool tests. ? A procedure to look inside your stomach (upper endoscopy). ? A test in which a sample of tissue is taken for testing (biopsy). How is  this treated? Treatment for this condition depends on what caused it. You may be given:  Antibiotic medicine, if your condition was caused by germs.  H2 blockers and similar medicines, if your condition was caused by too much acid. Follow these instructions at home: Medicines  Take over-the-counter and prescription medicines only as told by your doctor.  If you were prescribed an antibiotic medicine, take it as told by your doctor. Do not stop taking it even if you start to feel better. Eating and drinking   Eat small meals often, instead of large meals.  Avoid foods and drinks that make your symptoms worse.  Drink enough fluid to keep your pee (urine) pale yellow. Alcohol use  Do not drink alcohol if: ? Your doctor tells you not to drink. ? You are pregnant, may be pregnant, or are planning to become pregnant.  If you drink alcohol: ? Limit your use to:  0-1 drink a day for women.  0-2 drinks a day for men. ? Be aware of how much alcohol is in your drink. In the [place] one drink equals one 12 oz bottle of beer (355 mL), one 5 oz glass of wine (148 mL), or one 1 oz glass of hard liquor (44 mL). General instructions  Talk with your doctor about ways to manage stress. You can exercise or do deep breathing, meditation, or yoga.  Do not smoke or use products that have nicotine or tobacco. If you need help quitting, ask your doctor.  Keep all follow-up visits as told by your doctor. This is important. Contact a doctor if:  Your symptoms get worse.  Your symptoms go away and  then come back. Get help right away if:  You throw up blood or something that looks like coffee grounds.  You have black or dark red poop.  You throw up any time you try to drink fluids.  Your stomach pain gets worse.  You have a fever.  You do not feel better after one week. Summary  Gastritis is swelling (inflammation) of the stomach.  You must get help for this condition. If you do not get help, your stomach can bleed, and you can get sores (ulcers).  This condition is diagnosed with medical history, physical exam, or tests.  You can be treated with medicines for germs or medicines to block too much acid in your stomach. This information is not intended to replace advice given to you by your health care provider. Make sure you discuss any questions you have with your health care provider. Document Released: 11/08/2007 Document Revised: 10/09/2017 Document Reviewed: 10/09/2017 Elsevier Patient Education  2020 [employer].   Steps to Quit Smoking Smoking tobacco is the leading cause of preventable death. It can affect almost every organ in the body. Smoking puts you and people around you at risk for many serious, long-lasting (chronic) diseases. Quitting smoking can be hard, but it is one of the best things that you can do for your health. It is never too late to quit. How do I get ready to quit? When you decide to quit smoking, make a plan to help you succeed. Before you quit:  Pick a date to quit. Set a date within the next 2 weeks to give you time to prepare.  Write down the reasons why you are quitting. Keep this list in places where you will see it often.  Tell your family, friends, and co-workers that you are quitting. Their support is important.  Talk  with your doctor about the choices that may help you quit.  Find out if your health insurance will pay for these treatments.  Know the people, places, things, and activities that make you want to smoke (triggers).  Avoid them. What first steps can I take to quit smoking?  Throw away all cigarettes at home, at work, and in your car.  Throw away the things that you use when you smoke, such as ashtrays and lighters.  Clean your car. Make sure to empty the ashtray.  Clean your home, including curtains and carpets. What can I do to help me quit smoking? Talk with your doctor about taking medicines and seeing a counselor at the same time. You are more likely to succeed when you do both.  If you are pregnant or breastfeeding, talk with your doctor about counseling or other ways to quit smoking. Do not take medicine to help you quit smoking unless your doctor tells you to do so. To quit smoking: Quit right away  Quit smoking totally, instead of slowly cutting back on how much you smoke over a period of time.  Go to counseling. You are more likely to quit if you go to counseling sessions regularly. Take medicine You may take medicines to help you quit. Some medicines need a prescription, and some you can buy over-the-counter. Some medicines may contain a drug called nicotine to replace the nicotine in cigarettes. Medicines may:  Help you to stop having the desire to smoke (cravings).  Help to stop the problems that come when you stop smoking (withdrawal symptoms). Your doctor may ask you to use:  Nicotine patches, gum, or lozenges.  Nicotine inhalers or sprays.  Non-nicotine medicine that is taken by mouth. Find resources Find resources and other ways to help you quit smoking and remain smoke-free after you quit. These resources are most helpful when you use them often. They include:  Online chats with a Social worker.  Phone quitlines.  Printed Furniture conservator/restorer.  Support groups or group counseling.  Text messaging programs.  Mobile phone apps. Use apps on your mobile phone or tablet that can help you stick to your quit plan. There are many free apps for mobile phones and tablets as well as  websites. Examples include Quit Guide from the [employer] and smokefree.gov  What things can I do to make it easier to quit?   Talk to your family and friends. Ask them to support and encourage you.  Call a phone quitline (1-800-QUIT-NOW), reach out to support groups, or work with a Social worker.  Ask people who smoke to not smoke around you.  Avoid places that make you want to smoke, such as: ? Bars. ? Parties. ? Smoke-break areas at work.  Spend time with people who do not smoke.  Lower the stress in your life. Stress can make you want to smoke. Try these things to help your stress: ? Getting regular exercise. ? Doing deep-breathing exercises. ? Doing yoga. ? Meditating. ? Doing a body scan. To do this, close your eyes, focus on one area of your body at a time from head to toe. Notice which parts of your body are tense. Try to relax the muscles in those areas. How will I feel when I quit smoking? Day 1 to 3 weeks Within the first 24 hours, you may start to have some problems that come from quitting tobacco. These problems are very bad 2-3 days after you quit, but they do not often last for more  than 2-3 weeks. You may get these symptoms:  Mood swings.  Feeling restless, nervous, angry, or annoyed.  Trouble concentrating.  Dizziness.  Strong desire for high-sugar foods and nicotine.  Weight gain.  Trouble pooping (constipation).  Feeling like you may vomit (nausea).  Coughing or a sore throat.  Changes in how the medicines that you take for other issues work in your body.  Depression.  Trouble sleeping (insomnia). Week 3 and afterward After the first 2-3 weeks of quitting, you may start to notice more positive results, such as:  Better sense of smell and taste.  Less coughing and sore throat.  Slower heart rate.  Lower blood pressure.  Clearer skin.  Better breathing.  Fewer sick days. Quitting smoking can be hard. Do not give up if you fail the first time.  Some people need to try a few times before they succeed. Do your best to stick to your quit plan, and talk with your doctor if you have any questions or concerns. Summary  Smoking tobacco is the leading cause of preventable death. Quitting smoking can be hard, but it is one of the best things that you can do for your health.  When you decide to quit smoking, make a plan to help you succeed.  Quit smoking right away, not slowly over a period of time.  When you start quitting, seek help from your doctor, family, or friends. This information is not intended to replace advice given to you by your health care provider. Make sure you discuss any questions you have with your health care provider. Document Released: 03/18/2009 Document Revised: 08/09/2018 Document Reviewed: 08/10/2018 Elsevier Patient Education  2020 [employer].

## 2019-04-18 NOTE — Discharge Summary (Signed)
Physician Discharge Summary  Patient ID: David Miranda MRN: [id number] DOB/AGE: 1985/11/17 33 y.o.  Admit date: 04/12/2019 Discharge date: 04/21/2019  Admission Diagnoses: Small bowel obstruction  Discharge Diagnoses:  Active Problems:   Alcohol abuse   Leukocytosis   Abdominal pain, epigastric   Pancreatitis   Hypokalemia   Essential hypertension   Tobacco abuse   GERD (gastroesophageal reflux disease)   Pancreatic pseudocyst/cyst--alcoholic pancreatitis   Gastric outlet obstruction   Peritonitis (HCC)   SBO (small bowel obstruction) (HCC) Symptomatic acute on chronic anemia related to acute blood loss and chronic disease   Discharged Condition: good  Hospital Course: David Miranda is a 33 yo with a history of gastric outlet obstruction who had a loop gastrojejunostomy 10/30, and was discharged after about 5 days in the hospital. He returned to the hospital after falling down and hitting his abdomen per his report. He was found to have concern for a small bowel obstruction on his CT and severe electrolyte abnormalities consistent with vomiting with a hypokalemic, hypochloremic metabolic alkalosis.  He was brought into the hospital for resuscitation, and further review of his imaging.  He was noted to have what appeared to a twisting of his gastrojejunostomy and a bowel obstruction. He was taken back to the OR on 11/9, and underwent a revision gastrojejunostomy to roux en y gastroenterostomy reconstruction.  He did well post operatively. He did have some confusion initially after surgery but this improved with orientation. He was kept NPO with an NG tube in place until POD 3 when a UGI was performed with SBFT. The anastomosis was patent and no signs of leakage. He was started on a diet and advanced to soft/ full liquid diet. During his stay he was encouraged to quit smoking, and was warned of the dangers of NSAID use with gastroenterostomy anastomosis.  We also gave the patient blood for a  continual drifting H&H which is probably related to anemia of chronic disease coupled with acute on chronic anemia related to surgery. He received 2u pRBC preoperatively, and then 2 more prior to his discharge home due to symptomatic anemia.  Prior to discharge he was ambulating, having BMs, and had a repeat H&H performed. He did not want to stay to get his PICC line removed, and actually removed this himself. His H&H returned after his discharge with an appropriate rise to 10/ 32 from 7.6/24.   Consults: None  Significant Diagnostic Studies: CT a/p - gastrojejunostomy with twist and small bowel obstruction   Treatments: 04/14/19 - Revision of gastrojejunostomy to roux en y gastroenterostomy   Discharge Exam: Blood pressure 104/78, pulse 90, temperature 99 F (37.2 C), temperature source Oral, resp. rate 18, height 6' (1.829 m), weight 153 lb 3.5 oz (69.5 kg), SpO2 100 %.  Exam in AM prior to discharge.  General appearance: alert, cooperative and no distress Resp: normal work of breathing GI: soft, nondistended, appropriately tender, no erythema or drainage, staples intact Extremities: extremities normal, atraumatic, no cyanosis or edema  Disposition:   Discharge Instructions    Call MD for:  difficulty breathing, headache or visual disturbances   Complete by: As directed    Call MD for:  persistant dizziness or light-headedness   Complete by: As directed    Call MD for:  persistant nausea and vomiting   Complete by: As directed    Call MD for:  redness, tenderness, or signs of infection (pain, swelling, redness, odor or green/yellow discharge around incision site)   Complete by: As  directed    Call MD for:  severe uncontrolled pain   Complete by: As directed    Call MD for:  temperature >100.4   Complete by: As directed    Diet - low sodium heart healthy   Complete by: As directed    Increase activity slowly   Complete by: As directed      Allergies as of 04/18/2019   No Known  Allergies     Medication List    STOP taking these medications   ranitidine 300 MG tablet Commonly known as: ZANTAC     TAKE these medications   acetaminophen 325 MG tablet Commonly known as: Tylenol Take 2 tablets (650 mg total) by mouth every 4 (four) hours as needed for mild pain, fever or headache.   lipase/protease/amylase 36000 UNITS Cpep capsule Commonly known as: CREON Take 2 capsules (72,000 Units total) by mouth 3 (three) times daily with meals.   multivitamin with minerals Tabs tablet Take 1 tablet by mouth daily.   nicotine 14 mg/24hr patch Commonly known as: NICODERM CQ - dosed in mg/24 hours Place 1 patch (14 mg total) onto the skin daily.   ondansetron 4 MG tablet Commonly known as: ZOFRAN Take 1 tablet (4 mg total) by mouth every 6 (six) hours as needed for nausea.   oxyCODONE 5 MG immediate release tablet Commonly known as: Oxy IR/ROXICODONE Take 1 tablet (5 mg total) by mouth every 4 (four) hours as needed for up to 7 days for severe pain or breakthrough pain.   pantoprazole 40 MG tablet Commonly known as: PROTONIX Take 1 tablet (40 mg total) by mouth 2 (two) times daily.   senna-docusate 8.6-50 MG tablet Commonly known as: Senokot-S Take 2 tablets by mouth at bedtime.   traZODone 100 MG tablet Commonly known as: DESYREL Take 1 tablet (100 mg total) by mouth at bedtime.      Follow-up Information    [facility] Follow up on 04/22/2019.   Why: Please arrive by 11:00 for an appointment to establish care Contact information: [address] [place], [place]       Virl Cagey, MD Follow up on 04/30/2019.   Specialty: General Surgery Why: Staple removal; If you do not have an appt time by next week, Call the office to get the time.  Contact information: [address] [place] [address] [phone]           Signed: Virl Cagey 04/21/2019, 3:35 PM

## 2019-04-20 LAB — TYPE AND SCREEN
ABO/RH(D): B NEG
Antibody Screen: NEGATIVE
Unit division: 0
Unit division: 0

## 2019-04-20 LAB — BPAM RBC
Blood Product Expiration Date: 202012162359
Blood Product Expiration Date: 202012162359
ISSUE DATE / TIME: 202011131341
ISSUE DATE / TIME: 202011131643
Unit Type and Rh: 1700
Unit Type and Rh: 1700

## 2019-04-30 ENCOUNTER — Ambulatory Visit: Payer: Self-pay | Admitting: General Surgery

## 2019-05-06 ENCOUNTER — Ambulatory Visit (INDEPENDENT_AMBULATORY_CARE_PROVIDER_SITE_OTHER): Payer: Self-pay | Admitting: General Surgery

## 2019-05-06 ENCOUNTER — Other Ambulatory Visit: Payer: Self-pay

## 2019-05-06 ENCOUNTER — Encounter: Payer: Self-pay | Admitting: General Surgery

## 2019-05-06 ENCOUNTER — Encounter (HOSPITAL_COMMUNITY): Payer: Self-pay | Admitting: Emergency Medicine

## 2019-05-06 ENCOUNTER — Emergency Department (HOSPITAL_COMMUNITY)
Admission: EM | Admit: 2019-05-06 | Discharge: 2019-05-06 | Disposition: A | Discharge status: Home / Self Care | Payer: Self-pay | Attending: Emergency Medicine | Admitting: Emergency Medicine

## 2019-05-06 ENCOUNTER — Emergency Department (HOSPITAL_COMMUNITY): Payer: Self-pay

## 2019-05-06 VITALS — BP 116/81 | HR 51 | Temp 97.4°F | Resp 16 | Ht 72.0 in | Wt 141.0 lb

## 2019-05-06 DIAGNOSIS — G8918 Other acute postprocedural pain: Secondary | ICD-10-CM | POA: Insufficient documentation

## 2019-05-06 DIAGNOSIS — F17210 Nicotine dependence, cigarettes, uncomplicated: Secondary | ICD-10-CM | POA: Insufficient documentation

## 2019-05-06 DIAGNOSIS — K311 Adult hypertrophic pyloric stenosis: Secondary | ICD-10-CM

## 2019-05-06 DIAGNOSIS — R110 Nausea: Secondary | ICD-10-CM | POA: Insufficient documentation

## 2019-05-06 DIAGNOSIS — R1013 Epigastric pain: Secondary | ICD-10-CM | POA: Insufficient documentation

## 2019-05-06 LAB — COMPREHENSIVE METABOLIC PANEL
ALT: 10 U/L (ref 0–44)
AST: 14 U/L — ABNORMAL LOW (ref 15–41)
Albumin: 4.4 g/dL (ref 3.5–5.0)
Alkaline Phosphatase: 69 U/L (ref 38–126)
Anion gap: 14 (ref 5–15)
BUN: 15 mg/dL (ref 6–20)
CO2: 24 mmol/L (ref 22–32)
Calcium: 9.9 mg/dL (ref 8.9–10.3)
Chloride: 101 mmol/L (ref 98–111)
Creatinine, Ser: 0.95 mg/dL (ref 0.61–1.24)
GFR calc Af Amer: >60 mL/min (ref >60)
GFR calc non Af Amer: >60 mL/min (ref >60)
Glucose, Bld: 116 mg/dL — ABNORMAL HIGH (ref 70–99)
Potassium: 3.4 mmol/L — ABNORMAL LOW (ref 3.5–5.1)
Sodium: 139 mmol/L (ref 135–145)
Total Bilirubin: 0.3 mg/dL (ref 0.3–1.2)
Total Protein: 8.2 g/dL — ABNORMAL HIGH (ref 6.5–8.1)

## 2019-05-06 LAB — DIFFERENTIAL
Abs Immature Granulocytes: 0.06 10*3/uL (ref 0.00–0.07)
Basophils Absolute: 0.1 10*3/uL (ref 0.0–0.1)
Basophils Relative: 1 %
Eosinophils Absolute: 0.4 10*3/uL (ref 0.0–0.5)
Eosinophils Relative: 3 %
Immature Granulocytes: 0 %
Lymphocytes Relative: 16 %
Lymphs Abs: 2.4 10*3/uL (ref 0.7–4.0)
Monocytes Absolute: 1 10*3/uL (ref 0.1–1.0)
Monocytes Relative: 7 %
Neutro Abs: 10.8 10*3/uL — ABNORMAL HIGH (ref 1.7–7.7)
Neutrophils Relative %: 73 %

## 2019-05-06 LAB — CBC
HCT: 44.2 % (ref 39.0–52.0)
Hemoglobin: 13.9 g/dL (ref 13.0–17.0)
MCH: 24.5 pg — ABNORMAL LOW (ref 26.0–34.0)
MCHC: 31.4 g/dL (ref 30.0–36.0)
MCV: 77.8 fL — ABNORMAL LOW (ref 80.0–100.0)
Platelets: 393 10*3/uL (ref 150–400)
RBC: 5.68 MIL/uL (ref 4.22–5.81)
RDW: 23.7 % — ABNORMAL HIGH (ref 11.5–15.5)
WBC: 15 10*3/uL — ABNORMAL HIGH (ref 4.0–10.5)
nRBC: 0 % (ref 0.0–0.2)

## 2019-05-06 LAB — LIPASE, BLOOD: Lipase: 43 U/L (ref 11–51)

## 2019-05-06 LAB — ETHANOL: Alcohol, Ethyl (B): <10 mg/dL (ref <10)

## 2019-05-06 MED ORDER — SUCRALFATE 1 G PO TABS: 1.0000 g | ORAL_TABLET | Freq: Three times a day (TID) | ORAL | 1 refills | Status: DC

## 2019-05-06 MED ORDER — OMEPRAZOLE 40 MG PO CPDR: 40.0000 mg | DELAYED_RELEASE_CAPSULE | Freq: Two times a day (BID) | ORAL | 2 refills | Status: DC

## 2019-05-06 MED ORDER — HYDROMORPHONE HCL 1 MG/ML IJ SOLN
1.0000 mg | Freq: Once | INTRAMUSCULAR | Status: AC
  Administered 2019-05-06: 1 mg via INTRAVENOUS
  Filled 2019-05-06: qty 1

## 2019-05-06 MED ORDER — PANTOPRAZOLE SODIUM 40 MG IV SOLR
40.0000 mg | Freq: Once | INTRAVENOUS | Status: AC
  Administered 2019-05-06: 40 mg via INTRAVENOUS
  Filled 2019-05-06: qty 40

## 2019-05-06 MED ORDER — OXYCODONE HCL 5 MG PO TABS: 5.0000 mg | ORAL_TABLET | ORAL | 0 refills | Status: DC | PRN

## 2019-05-06 MED ORDER — ONDANSETRON HCL 4 MG/2ML IJ SOLN
4.0000 mg | Freq: Once | INTRAMUSCULAR | Status: AC
  Administered 2019-05-06: 4 mg via INTRAVENOUS
  Filled 2019-05-06: qty 2

## 2019-05-06 MED ORDER — IOHEXOL 9 MG/ML PO SOLN: 500.0000 mL | ORAL | Status: AC

## 2019-05-06 MED ORDER — SODIUM CHLORIDE 0.9 % IV BOLUS
1000.0000 mL | Freq: Once | INTRAVENOUS | Status: AC
  Administered 2019-05-06: 1000 mL via INTRAVENOUS

## 2019-05-06 MED ORDER — IOHEXOL 9 MG/ML PO SOLN
ORAL | Status: AC
  Filled 2019-05-06: qty 1000

## 2019-05-06 MED ORDER — IOHEXOL 300 MG/ML  SOLN
100.0000 mL | Freq: Once | INTRAMUSCULAR | Status: AC | PRN
  Administered 2019-05-06: 100 mL via INTRAVENOUS

## 2019-05-06 MED ORDER — HYDROCODONE-ACETAMINOPHEN 5-325 MG PO TABS: 1.0000 | ORAL_TABLET | ORAL | 0 refills | Status: DC | PRN

## 2019-05-06 NOTE — ED Provider Notes (Addendum)
[facility] EMERGENCY DEPARTMENT Provider Note   CSN: [id number] Arrival date & time: 05/06/19  [id number]     History   Chief Complaint Chief Complaint  Patient presents with  . Abdominal Pain    HPI David Miranda is a 33 y.o. male.     Patient s/p Roux en Y gastroenterostomy 04/14/19 due to gastric outlet obstruction, hx recurrent pancreatitis, c/o epigastric/upper abd pain 'since my surgery', constant, dull, mod-severe, slowly worsening, constant, persistent, no radiating. No specific exacerbating or alleviating factors.  +nausea. No diarrhea. Had bm yesterday. No abd distension. No redness or discharge from incision. No dysuria or gu c/o. No cough or uri symptoms. No chest pain or discomfort. Pt denies nsaid use, denies recent etoh use.   The history is provided by the patient.  Abdominal Pain Associated symptoms: nausea   Associated symptoms: no chest pain, no cough, no dysuria, no fever, no shortness of breath and no sore throat     Past Medical History:  Diagnosis Date  . GERD (gastroesophageal reflux disease)   . Pancreatitis     Patient Active Problem List   Diagnosis Date Noted  . SBO (small bowel obstruction) ([place])   . Peritonitis ([place]) 04/13/2019  . Gastric outlet obstruction   . Abdominal pain   . GI bleed 03/31/2019  . Emesis, persistent 03/30/2019  . Esophagitis 03/30/2019  . Pancreatic pseudocyst/cyst--alcoholic pancreatitis [id number]  . Acute necrotizing pancreatitis 07/24/2018  . Cannabis abuse 03/27/2018  . Acute kidney injury ([place])   . Hypomagnesemia   . Acute on chronic pancreatitis ([place]) 03/26/2018  . GERD (gastroesophageal reflux disease) 03/26/2018  . Acute renal failure (ARF) ([place]) 03/26/2018  . Hypokalemia 02/27/2018  . Essential hypertension 02/27/2018  . Tobacco abuse 02/27/2018  . Acute pancreatitis 02/27/2018  . Constipation   . Annular pancreas   . Pancreatitis   . Non-intractable vomiting with nausea   . Hepatic steatosis   .  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 Leukocytosis 12/27/2017  . Abdominal pain, epigastric 12/27/2017  . Acute alcoholic pancreatitis 0000000  . Alcohol abuse 12/26/2017    Past Surgical History:  Procedure Laterality Date  . ESOPHAGOGASTRODUODENOSCOPY (EGD) WITH PROPOFOL N/A 04/01/2019   Procedure: ESOPHAGOGASTRODUODENOSCOPY (EGD) WITH PROPOFOL;  Surgeon: Danie Binder, MD;  Location: [facility] ENDO SUITE;  Service: Endoscopy;  Laterality: N/A;  . GASTROJEJUNOSTOMY N/A 04/04/2019   Procedure: GASTROJEJUNOSTOMY;  Surgeon: Virl Cagey, MD;  Location: [facility] ORS;  Service: General;  Laterality: N/A;  . GASTROJEJUNOSTOMY  04/14/2019   Procedure: REVISION GASTROJEJUNOSTOMY TO ROUX-EN-Y;  Surgeon: Virl Cagey, MD;  Location: [facility] ORS;  Service: General;;  . NO PAST SURGERIES          Home Medications    Prior to Admission medications   Medication Sig Start Date End Date Taking? Authorizing Provider  acetaminophen (TYLENOL) 325 MG tablet Take 2 tablets (650 mg total) by mouth every 4 (four) hours as needed for mild pain, fever or headache. 04/08/19 04/07/20  Roxan Hockey, MD  lipase/protease/amylase (CREON) 36000 UNITS CPEP capsule Take 2 capsules (72,000 Units total) by mouth 3 (three) times daily with meals. Patient not taking: Reported on 04/13/2019 04/08/19   Roxan Hockey, MD  Multiple Vitamin (MULTIVITAMIN WITH MINERALS) TABS tablet Take 1 tablet by mouth daily. Patient not taking: Reported on 04/13/2019 04/08/19   Roxan Hockey, MD  nicotine (NICODERM CQ - DOSED IN MG/24 HOURS) 14 mg/24hr patch Place 1 patch (14 mg total) onto the skin daily. Patient not taking: Reported on  04/13/2019 04/08/19 05/08/19  Roxan Hockey, MD  ondansetron (ZOFRAN) 4 MG tablet Take 1 tablet (4 mg total) by mouth every 6 (six) hours as needed for nausea. Patient not taking: Reported on 04/13/2019 04/08/19   Virl Cagey, MD  pantoprazole (PROTONIX) 40 MG tablet Take 1 tablet (40 mg total)  by mouth 2 (two) times daily. 04/08/19   Virl Cagey, MD  senna-docusate (SENOKOT-S) 8.6-50 MG tablet Take 2 tablets by mouth at bedtime. Patient not taking: Reported on 04/13/2019 04/08/19 06/07/19  Roxan Hockey, MD  traZODone (DESYREL) 100 MG tablet Take 1 tablet (100 mg total) by mouth at bedtime. 04/08/19   Roxan Hockey, MD    Family History Family History  Problem Relation Age of Onset  . Cancer Other     Social History Social History   Tobacco Use  . Smoking status: Current Every Day Smoker    Packs/day: 1.00    Types: Cigarettes  . Smokeless tobacco: Never Used  Substance Use Topics  . Alcohol use: Yes    Comment: pint every few days  . Drug use: Not Currently    Types: Marijuana    Comment: occasionally     Allergies   Patient has no known allergies.   Review of Systems Review of Systems  Constitutional: Negative for fever.  HENT: Negative for sore throat.   Eyes: Negative for redness.  Respiratory: Negative for cough and shortness of breath.   Cardiovascular: Negative for chest pain.  Gastrointestinal: Positive for abdominal pain and nausea.  Endocrine: Negative for polyuria.  Genitourinary: Negative for dysuria and flank pain.  Musculoskeletal: Negative for back pain and neck pain.  Skin: Negative for rash.  Neurological: Negative for headaches.  Hematological: Does not bruise/bleed easily.  Psychiatric/Behavioral: Negative for confusion.     Physical Exam Updated Vital Signs BP (!) 127/97 (BP Location: Left Arm)   Pulse 66   Temp 98.7 F (37.1 C) (Oral)   Resp 15   SpO2 100%   Physical Exam Vitals signs and nursing note reviewed.  Constitutional:      Appearance: Normal appearance. He is well-developed.  HENT:     Head: Atraumatic.     Nose: Nose normal.     Mouth/Throat:     Mouth: Mucous membranes are moist.     Pharynx: Oropharynx is clear.  Eyes:     General: No scleral icterus.    Conjunctiva/sclera: Conjunctivae normal.      Pupils: Pupils are equal, round, and reactive to light.  Neck:     Musculoskeletal: Normal range of motion and neck supple. No neck rigidity.     Trachea: No tracheal deviation.  Cardiovascular:     Rate and Rhythm: Normal rate and regular rhythm.     Pulses: Normal pulses.     Heart sounds: Normal heart sounds. No murmur. No friction rub. No gallop.   Pulmonary:     Effort: Pulmonary effort is normal. No accessory muscle usage or respiratory distress.     Breath sounds: Normal breath sounds.  Abdominal:     General: Bowel sounds are normal. There is no distension.     Palpations: Abdomen is soft.     Tenderness: There is abdominal tenderness. There is no guarding.     Comments: Epigastric and upper abd tenderness. Surgical staples intact, no sign of infection to wound.   Genitourinary:    Comments: No cva tenderness. Musculoskeletal:        General: No swelling.  Skin:  General: Skin is warm and dry.     Findings: No rash.  Neurological:     Mental Status: He is alert.     Comments: Alert, speech clear.   Psychiatric:        Mood and Affect: Mood normal.      ED Treatments / Results  Labs (all labs ordered are listed, but only abnormal results are displayed) Results for orders placed or performed during the hospital encounter of 05/06/19  CBC  Result Value Ref Range   WBC 15.0 (H) 4.0 - 10.5 K/uL   RBC 5.68 4.22 - 5.81 MIL/uL   Hemoglobin 13.9 13.0 - 17.0 g/dL   HCT 44.2 39.0 - 52.0 %   MCV 77.8 (L) 80.0 - 100.0 fL   MCH 24.5 (L) 26.0 - 34.0 pg   MCHC 31.4 30.0 - 36.0 g/dL   RDW 23.7 (H) 11.5 - 15.5 %   Platelets 393 150 - 400 K/uL   nRBC 0.0 0.0 - 0.2 %  CMET  Result Value Ref Range   Sodium 139 135 - 145 mmol/L   Potassium 3.4 (L) 3.5 - 5.1 mmol/L   Chloride 101 98 - 111 mmol/L   CO2 24 22 - 32 mmol/L   Glucose, Bld 116 (H) 70 - 99 mg/dL   BUN 15 6 - 20 mg/dL   Creatinine, Ser 0.95 0.61 - 1.24 mg/dL   Calcium 9.9 8.9 - 10.3 mg/dL   Total Protein 8.2  (H) 6.5 - 8.1 g/dL   Albumin 4.4 3.5 - 5.0 g/dL   AST 14 (L) 15 - 41 U/L   ALT 10 0 - 44 U/L   Alkaline Phosphatase 69 38 - 126 U/L   Total Bilirubin 0.3 0.3 - 1.2 mg/dL   GFR calc non Af Amer >60 >60 mL/min   GFR calc Af Amer >60 >60 mL/min   Anion gap 14 5 - 15  Lipase  Result Value Ref Range   Lipase 43 11 - 51 U/L  Etoh  Result Value Ref Range   Alcohol, Ethyl (B) <10 <10 mg/dL     EKG None  Radiology No results found.  Procedures Procedures (including critical care time)  Medications Ordered in ED Medications  sodium chloride 0.9 % bolus 1,000 mL (has no administration in time range)  HYDROmorphone (DILAUDID) injection 1 mg (has no administration in time range)  ondansetron (ZOFRAN) injection 4 mg (has no administration in time range)     Initial Impression / Assessment and Plan / ED Course  I have reviewed the triage vital signs and the nursing notes.  Pertinent labs & imaging results that were available during my care of the patient were reviewed by me and considered in my medical decision making (see chart for details).  Iv ns bolus. Dilaudid 1 mg iv. zofran iv. Stat labs. Imaging ordered.  Reviewed nursing notes and prior charts for additional history.   Labs reviewed/interpreted by me - wbc elev. Chem normal.   Ct remains pending.   Pain initially improved w meds, then recurs. Recheck abd soft, upper abd tenderness.   Pt requests additional pain medication. Dilaudid 1 mg iv. protonix iv.   Await ct.   Pain improved w meds.   0720, signed out to Dr Rogene Houston that ct is pending, to check ct when resulted and recheck pt, and disposition appropriately.     Final Clinical Impressions(s) / ED Diagnoses   Final diagnoses:  None    ED Discharge Orders  None           Lajean Saver, MD 05/06/19 [phone]

## 2019-05-06 NOTE — ED Notes (Signed)
ED Provider at bedside. 

## 2019-05-06 NOTE — ED Triage Notes (Signed)
Pt from home via [place]. Pt C/O abdominal pain. Pt has had multiple recent bowel surgeries. Pt reports normal bowel movements and normal urination.

## 2019-05-06 NOTE — ED Provider Notes (Signed)
Patient CT scan without any significant postsurgical changes.  Reviewed verbally with Dr. Arnoldo Morale on-call for general surgery group.  Patient does have a little bit increase in white blood cell count today.  Nothing indicating readmission.  They will see him in the office today patient will be discharged G[facility] Organ him in the office by Dr. Constance Haw up to 12 noon today.  As it turns out patient had an appointment today at 1045.  But they will still be out of see him later.  Patient still has a staples in place.  Dr. Arnoldo Morale has about take those out in the office as well.  Dr. Constance Haw is expecting to see him.   Fredia Sorrow, MD 05/06/19 [phone]

## 2019-05-06 NOTE — Progress Notes (Addendum)
[facility] Note   HPI:  33 y.o. Male presents to clinic for post-op follow-up evaluation after his roux en y gastrojejunostomy. He has been eating and drinking and having regular Bms. He did go to the ED today for pain and says that he has a crampy pain in his abdomen and can only eat small meals. He also has sharp pain at his incision site. He denies any nausea or vomiting. He has had no fevers or chills.   His work up in the Ed consisted of a [place] and labs. His CT demonstrated no obstruction, and some thickening of the gastrojejunostomy anastomosis.  His WBC was 15 without fever or signs of infection. I asked the lab to add on a differential to this collection.   He continues to smoke, but is not drinking. He is not taking NSAIDs. He is out of pain medication. I last filled this 11/14- reviewed [place] PMP website.    Review of Systems:  Early satiety / small meals Regular Bms No fever  + reflux symptoms/ burning pain  All other review of systems: otherwise negative   Vital Signs:  BP 116/81 (BP Location: Left Arm, Patient Position: Sitting, Cuff Size: Normal)   Pulse (!) 51   Temp (!) 97.4 F (36.3 C) (Oral)   Resp 16   Ht 6' (1.829 m)   Wt 141 lb (64 kg)   SpO2 99%   BMI 19.12 kg/m    Physical Exam:  Physical Exam Vitals signs reviewed.  HENT:     Head: Normocephalic.     Mouth/Throat:     Mouth: Mucous membranes are moist.  Cardiovascular:     Rate and Rhythm: Normal rate.  Pulmonary:     Effort: Pulmonary effort is normal.  Abdominal:     General: There is no distension.     Palpations: Abdomen is soft.     Tenderness: There is abdominal tenderness.     Comments: Tender around incision, staples removed, no erythema or drainage or hernia  Skin:    General: Skin is warm and dry.  Neurological:     Mental Status: He is alert.     Laboratory studies:  CBC:  Results for HARVEL, EISENMAN (MRN [id number]) as of 05/07/2019 10:12  Ref. Range 05/06/2019 04:50   WBC Latest Ref Range: 4.0 - 10.5 K/uL 15.0 (H)  RBC Latest Ref Range: 4.22 - 5.81 MIL/uL 5.68  Hemoglobin Latest Ref Range: 13.0 - 17.0 g/dL 13.9  HCT Latest Ref Range: 39.0 - 52.0 % 44.2  MCV Latest Ref Range: 80.0 - 100.0 fL 77.8 (L)  MCH Latest Ref Range: 26.0 - 34.0 pg 24.5 (L)  MCHC Latest Ref Range: 30.0 - 36.0 g/dL 31.4  RDW Latest Ref Range: 11.5 - 15.5 % 23.7 (H)  Platelets Latest Ref Range: 150 - 400 K/uL 393  nRBC Latest Ref Range: 0.0 - 0.2 % 0.0  Neutrophils Latest Units: % 73  Lymphocytes Latest Units: % 16  Monocytes Relative Latest Units: % 7  Eosinophil Latest Units: % 3  Basophil Latest Units: % 1  Immature Granulocytes Latest Units: % 0  NEUT# Latest Ref Range: 1.7 - 7.7 K/uL 10.8 (H)  Lymphocyte # Latest Ref Range: 0.7 - 4.0 K/uL 2.4  Monocyte # Latest Ref Range: 0.1 - 1.0 K/uL 1.0  Eosinophils Absolute Latest Ref Range: 0.0 - 0.5 K/uL 0.4  Basophils Absolute Latest Ref Range: 0.0 - 0.1 K/uL 0.1  Abs Immature Granulocytes Latest Ref Range: 0.00 -  0.07 K/uL 0.06   BMP:  Lab Results  Component Value Date   GLUCOSE 116 (H) 05/06/2019   CO2 24 05/06/2019   BUN 15 05/06/2019   CREATININE 0.95 05/06/2019   CALCIUM 9.9 05/06/2019     Imaging:  CT a/p 05/06/19- personally reviewed- no bowel obstruction, patent anastomosis, some thickening of the wall of the jejunum just distal to the GJ, stomach more decompressed that prior   IMPRESSION: 1. Stable diffuse wall thickening of distal esophagus is noted concerning for esophagitis. 2. Stable 2 cm cystic abnormality seen in pancreatic head. Follow-up with MRI in 1 year is recommended to ensure stability. 3. Moderate gastric distention is noted. Status post gastrojejunostomy. There remains some wall thickening and inflammation involving the jejunum just distal to the gastrojejunal anastomosis concerning for inflammatory or infectious enteritis. 4. No other significant abnormality seen in the abdomen or  pelvis.   Assessment:  33 y.o. yo Male with some inflammation and swelling around the [place]. No signs of infection and I think this is likely related to post op swelling, his smoking, and he could be developing an ulcer.  Leukocytosis to 15 but no shift on the differential, I think this is more related to inflammation as there are no other signs of infection or fever.   We discussed smoking cessation. He says he is down to 4/day. We discussed no NSAIDs. We discussed that he is only taking Ranitidine ? I prescribed Protonix BID, but he is not on this medication. He says that omeprazole worked for him in the past. We discussed the need for a PPI instead of the H2 blocker. Also discussed carafate to help with the crampy pain.   Plan:  - Omeprazole 40mg  Bid for now for possible ulcer formation, Carafate QID   - Roxicodone initially prescribed but [facility] without supply, changed to Norco 5-325mg  q4 PRN # 30 - Pharmacy saying that patient only wanted the pain medications, and I discussed with them the need for him to get the Carafate. They have given him a coupon to drop this cost, and he is to purchase omeprazole over the counter.  Patient called office complaining about this, but I do not want to give him the pain meds without treating the other issues as this is not appropriate. The patient says he can afford the medication tomorrow, so he needs to return then.  -Continue soft diet   Future Appointments  Date Time Provider [place]  06/03/2019  9:15 AM Virl Cagey, MD [facility] None    All of the above recommendations were discussed with the patient, and all of patient's questions were answered to his expressed satisfaction.  Curlene Labrum, MD [facility] [address] [place], [place] [id number] [phone] (office)

## 2019-05-06 NOTE — Discharge Instructions (Addendum)
Spoke with Dr. Arnoldo Morale on-call for group.  States that Dr. Constance Haw will be out of see you in the office today here in [place] up to 12 noon.  She will also be a take the staples out.

## 2019-05-16 ENCOUNTER — Emergency Department (HOSPITAL_COMMUNITY): Payer: Self-pay

## 2019-05-16 ENCOUNTER — Observation Stay (HOSPITAL_COMMUNITY)
Admission: EM | Admit: 2019-05-16 | Discharge: 2019-05-17 | Disposition: A | Discharge status: Home / Self Care | Payer: Self-pay | Attending: Internal Medicine | Admitting: Internal Medicine

## 2019-05-16 ENCOUNTER — Other Ambulatory Visit: Payer: Self-pay

## 2019-05-16 ENCOUNTER — Encounter (HOSPITAL_COMMUNITY): Payer: Self-pay | Admitting: *Deleted

## 2019-05-16 DIAGNOSIS — F17210 Nicotine dependence, cigarettes, uncomplicated: Secondary | ICD-10-CM | POA: Insufficient documentation

## 2019-05-16 DIAGNOSIS — R1084 Generalized abdominal pain: Principal | ICD-10-CM | POA: Insufficient documentation

## 2019-05-16 DIAGNOSIS — Z20828 Contact with and (suspected) exposure to other viral communicable diseases: Secondary | ICD-10-CM | POA: Insufficient documentation

## 2019-05-16 DIAGNOSIS — Z79899 Other long term (current) drug therapy: Secondary | ICD-10-CM | POA: Insufficient documentation

## 2019-05-16 DIAGNOSIS — F1021 Alcohol dependence, in remission: Secondary | ICD-10-CM | POA: Insufficient documentation

## 2019-05-16 DIAGNOSIS — K219 Gastro-esophageal reflux disease without esophagitis: Secondary | ICD-10-CM | POA: Insufficient documentation

## 2019-05-16 DIAGNOSIS — K860 Alcohol-induced chronic pancreatitis: Secondary | ICD-10-CM | POA: Insufficient documentation

## 2019-05-16 DIAGNOSIS — R109 Unspecified abdominal pain: Secondary | ICD-10-CM | POA: Diagnosis present

## 2019-05-16 DIAGNOSIS — I10 Essential (primary) hypertension: Secondary | ICD-10-CM | POA: Insufficient documentation

## 2019-05-16 LAB — COMPREHENSIVE METABOLIC PANEL
ALT: 12 U/L (ref 0–44)
AST: 13 U/L — ABNORMAL LOW (ref 15–41)
Albumin: 3.4 g/dL — ABNORMAL LOW (ref 3.5–5.0)
Alkaline Phosphatase: 65 U/L (ref 38–126)
Anion gap: 8 (ref 5–15)
BUN: 12 mg/dL (ref 6–20)
CO2: 23 mmol/L (ref 22–32)
Calcium: 9 mg/dL (ref 8.9–10.3)
Chloride: 107 mmol/L (ref 98–111)
Creatinine, Ser: 0.61 mg/dL (ref 0.61–1.24)
GFR calc Af Amer: >60 mL/min (ref >60)
GFR calc non Af Amer: >60 mL/min (ref >60)
Glucose, Bld: 100 mg/dL — ABNORMAL HIGH (ref 70–99)
Potassium: 3.8 mmol/L (ref 3.5–5.1)
Sodium: 138 mmol/L (ref 135–145)
Total Bilirubin: <0.1 mg/dL — ABNORMAL LOW (ref 0.3–1.2)
Total Protein: 6.6 g/dL (ref 6.5–8.1)

## 2019-05-16 LAB — CBC WITH DIFFERENTIAL/PLATELET
Abs Immature Granulocytes: 0.03 10*3/uL (ref 0.00–0.07)
Basophils Absolute: 0.1 10*3/uL (ref 0.0–0.1)
Basophils Relative: 1 %
Eosinophils Absolute: 0.2 10*3/uL (ref 0.0–0.5)
Eosinophils Relative: 2 %
HCT: 36.3 % — ABNORMAL LOW (ref 39.0–52.0)
Hemoglobin: 11.1 g/dL — ABNORMAL LOW (ref 13.0–17.0)
Immature Granulocytes: 0 %
Lymphocytes Relative: 27 %
Lymphs Abs: 2.8 10*3/uL (ref 0.7–4.0)
MCH: 24.3 pg — ABNORMAL LOW (ref 26.0–34.0)
MCHC: 30.6 g/dL (ref 30.0–36.0)
MCV: 79.6 fL — ABNORMAL LOW (ref 80.0–100.0)
Monocytes Absolute: 0.7 10*3/uL (ref 0.1–1.0)
Monocytes Relative: 7 %
Neutro Abs: 6.7 10*3/uL (ref 1.7–7.7)
Neutrophils Relative %: 63 %
Platelets: 321 10*3/uL (ref 150–400)
RBC: 4.56 MIL/uL (ref 4.22–5.81)
RDW: 23.4 % — ABNORMAL HIGH (ref 11.5–15.5)
WBC: 10.4 10*3/uL (ref 4.0–10.5)
nRBC: 0 % (ref 0.0–0.2)

## 2019-05-16 LAB — MAGNESIUM: Magnesium: 1.9 mg/dL (ref 1.7–2.4)

## 2019-05-16 LAB — URINALYSIS, ROUTINE W REFLEX MICROSCOPIC
Bilirubin Urine: NEGATIVE
Glucose, UA: NEGATIVE mg/dL
Hgb urine dipstick: NEGATIVE
Ketones, ur: NEGATIVE mg/dL
Leukocytes,Ua: NEGATIVE
Nitrite: NEGATIVE
Protein, ur: NEGATIVE mg/dL
Specific Gravity, Urine: 1.008 (ref 1.005–1.030)
pH: 5 (ref 5.0–8.0)

## 2019-05-16 LAB — PHOSPHORUS: Phosphorus: 3.1 mg/dL (ref 2.5–4.6)

## 2019-05-16 LAB — ETHANOL: Alcohol, Ethyl (B): <10 mg/dL (ref <10)

## 2019-05-16 LAB — LIPASE, BLOOD: Lipase: 73 U/L — ABNORMAL HIGH (ref 11–51)

## 2019-05-16 MED ORDER — SODIUM CHLORIDE 0.9 % IV SOLN
INTRAVENOUS | Status: DC
  Administered 2019-05-16: 14:00:00 via INTRAVENOUS

## 2019-05-16 MED ORDER — FENTANYL CITRATE (PF) 100 MCG/2ML IJ SOLN
50.0000 ug | Freq: Once | INTRAMUSCULAR | Status: AC
  Administered 2019-05-16: 16:00:00 50 ug via INTRAVENOUS
  Filled 2019-05-16: qty 2

## 2019-05-16 MED ORDER — IOHEXOL 300 MG/ML  SOLN
100.0000 mL | Freq: Once | INTRAMUSCULAR | Status: AC | PRN
  Administered 2019-05-16: 17:00:00 100 mL via INTRAVENOUS

## 2019-05-16 MED ORDER — HYDROMORPHONE HCL 1 MG/ML IJ SOLN
1.0000 mg | INTRAMUSCULAR | Status: DC | PRN
  Administered 2019-05-16 – 2019-05-17 (×5): 1 mg via INTRAVENOUS
  Filled 2019-05-16 (×5): qty 1

## 2019-05-16 MED ORDER — LORAZEPAM 0.5 MG PO TABS
0.5000 mg | ORAL_TABLET | Freq: Once | ORAL | Status: AC
  Administered 2019-05-16: 21:00:00 0.5 mg via ORAL
  Filled 2019-05-16: qty 1

## 2019-05-16 MED ORDER — FOLIC ACID 5 MG/ML IJ SOLN
1.0000 mg | Freq: Every day | INTRAMUSCULAR | Status: DC
  Administered 2019-05-17: 11:00:00 1 mg via INTRAVENOUS
  Filled 2019-05-16 (×3): qty 0.2

## 2019-05-16 MED ORDER — ONDANSETRON HCL 4 MG/2ML IJ SOLN
4.0000 mg | Freq: Four times a day (QID) | INTRAMUSCULAR | Status: DC | PRN
  Administered 2019-05-17: 4 mg via INTRAVENOUS
  Filled 2019-05-16: qty 2

## 2019-05-16 MED ORDER — KCL IN DEXTROSE-NACL 20-5-0.9 MEQ/L-%-% IV SOLN
INTRAVENOUS | Status: DC
  Administered 2019-05-17: via INTRAVENOUS
  Filled 2019-05-16 (×3): qty 1000

## 2019-05-16 MED ORDER — ONDANSETRON HCL 4 MG PO TABS: 4.0000 mg | ORAL_TABLET | Freq: Four times a day (QID) | ORAL | Status: DC | PRN

## 2019-05-16 MED ORDER — ACETAMINOPHEN 650 MG RE SUPP: 650.0000 mg | Freq: Four times a day (QID) | RECTAL | Status: DC | PRN

## 2019-05-16 MED ORDER — ACETAMINOPHEN 325 MG PO TABS
650.0000 mg | ORAL_TABLET | Freq: Four times a day (QID) | ORAL | Status: DC | PRN
  Administered 2019-05-17: 10:00:00 650 mg via ORAL
  Filled 2019-05-16: qty 2

## 2019-05-16 MED ORDER — ONDANSETRON HCL 4 MG/2ML IJ SOLN
4.0000 mg | Freq: Once | INTRAMUSCULAR | Status: AC
  Administered 2019-05-16: 14:00:00 4 mg via INTRAVENOUS

## 2019-05-16 MED ORDER — SODIUM CHLORIDE 0.9 % IV BOLUS
1000.0000 mL | Freq: Once | INTRAVENOUS | Status: AC
  Administered 2019-05-16: 14:00:00 1000 mL via INTRAVENOUS

## 2019-05-16 MED ORDER — POLYETHYLENE GLYCOL 3350 17 G PO PACK: 17.0000 g | PACK | Freq: Every day | ORAL | Status: DC | PRN

## 2019-05-16 MED ORDER — HYDROMORPHONE HCL 1 MG/ML IJ SOLN
1.0000 mg | Freq: Once | INTRAMUSCULAR | Status: AC
  Administered 2019-05-16: 18:00:00 1 mg via INTRAVENOUS
  Filled 2019-05-16: qty 1

## 2019-05-16 MED ORDER — FENTANYL CITRATE (PF) 100 MCG/2ML IJ SOLN
50.0000 ug | Freq: Once | INTRAMUSCULAR | Status: AC
  Administered 2019-05-16: 14:00:00 50 ug via INTRAVENOUS
  Filled 2019-05-16: qty 2

## 2019-05-16 MED ORDER — ENSURE ENLIVE PO LIQD: 237.0000 mL | Freq: Two times a day (BID) | ORAL | Status: DC

## 2019-05-16 MED ORDER — THIAMINE HCL 100 MG/ML IJ SOLN
100.0000 mg | Freq: Every day | INTRAMUSCULAR | Status: DC
  Administered 2019-05-17: 08:00:00 100 mg via INTRAVENOUS
  Filled 2019-05-16: qty 2

## 2019-05-16 MED ORDER — FAMOTIDINE IN NACL 20-0.9 MG/50ML-% IV SOLN
20.0000 mg | Freq: Two times a day (BID) | INTRAVENOUS | Status: DC
  Administered 2019-05-16 – 2019-05-17 (×2): 20 mg via INTRAVENOUS
  Filled 2019-05-16 (×2): qty 50

## 2019-05-16 MED ORDER — ADULT MULTIVITAMIN W/MINERALS CH: 1.0000 | ORAL_TABLET | Freq: Every day | ORAL | Status: DC

## 2019-05-16 NOTE — ED Notes (Signed)
Attempted report. David Miranda, [address] [employer], states [name], nurse, will call back for report.

## 2019-05-16 NOTE — ED Provider Notes (Signed)
[facility] EMERGENCY DEPARTMENT Provider Note   CSN: [id number] Arrival date & time: 05/16/19  1028     History Chief Complaint  Patient presents with  . Abdominal Pain    David Miranda is a 33 y.o. male.  HPI    Patient presents concern of abdominal pain, nausea, vomiting. 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 He states that since that procedure he has had some degree of pain, but now over the past 2 or 3 days he has developed more severe pain, focally in the upper abdomen, midline, minimally radiating. There is associated anorexia, nausea, vomiting, minimal p.o. tolerance. He does continue to have gas. No fever, chills, cough, other pain. No relief with home medications including narcotics when he is able to tolerate them. Past Medical History:  Diagnosis Date  . GERD (gastroesophageal reflux disease)   . Pancreatitis     Patient Active Problem List   Diagnosis Date Noted  . SBO (small bowel obstruction) ([place])   . Peritonitis ([place]) 04/13/2019  . Gastric outlet obstruction   . Abdominal pain   . GI bleed 03/31/2019  . Emesis, persistent 03/30/2019  . Esophagitis 03/30/2019  . Pancreatic pseudocyst/cyst--alcoholic pancreatitis [id number]  . Acute necrotizing pancreatitis 07/24/2018  . Cannabis abuse 03/27/2018  . Acute kidney injury ([place])   . Hypomagnesemia   . Acute on chronic pancreatitis ([place]) 03/26/2018  . GERD (gastroesophageal reflux disease) 03/26/2018  . Acute renal failure (ARF) ([place]) 03/26/2018  . Hypokalemia 02/27/2018  . Essential hypertension 02/27/2018  . Tobacco abuse 02/27/2018  . Acute pancreatitis 02/27/2018  . Constipation   . Annular pancreas   . Pancreatitis   . Non-intractable vomiting with nausea   . Hepatic steatosis   . 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 Leukocytosis 12/27/2017  . Abdominal pain, epigastric 12/27/2017  . Acute alcoholic pancreatitis 0000000  . Alcohol abuse 12/26/2017    Past  Surgical History:  Procedure Laterality Date  . ESOPHAGOGASTRODUODENOSCOPY (EGD) WITH PROPOFOL N/A 04/01/2019   Procedure: ESOPHAGOGASTRODUODENOSCOPY (EGD) WITH PROPOFOL;  Surgeon: Danie Binder, MD;  Location: [facility] ENDO SUITE;  Service: Endoscopy;  Laterality: N/A;  . GASTROJEJUNOSTOMY N/A 04/04/2019   Procedure: GASTROJEJUNOSTOMY;  Surgeon: Virl Cagey, MD;  Location: [facility] ORS;  Service: General;  Laterality: N/A;  . GASTROJEJUNOSTOMY  04/14/2019   Procedure: REVISION GASTROJEJUNOSTOMY TO ROUX-EN-Y;  Surgeon: Virl Cagey, MD;  Location: [facility] ORS;  Service: General;;  . NO PAST SURGERIES         Family History  Problem Relation Age of Onset  . Cancer Other     Social History   Tobacco Use  . Smoking status: Current Every Day Smoker    Packs/day: 1.00    Types: Cigarettes  . Smokeless tobacco: Never Used  Substance Use Topics  . Alcohol use: Yes    Comment: pint every few days  . Drug use: Not Currently    Types: Marijuana    Comment: occasionally    Home Medications Prior to Admission medications   Medication Sig Start Date End Date Taking? Authorizing Provider  acetaminophen (TYLENOL) 325 MG tablet Take 2 tablets (650 mg total) by mouth every 4 (four) hours as needed for mild pain, fever or headache. 04/08/19 04/07/20  Roxan Hockey, MD  HYDROcodone-acetaminophen (NORCO) 5-325 MG tablet Take 1 tablet by mouth every 4 (four) hours as needed for severe pain. 05/06/19   Virl Cagey, MD  lipase/protease/amylase (CREON) 36000 UNITS CPEP capsule Take 2 capsules (72,000 Units  total) by mouth 3 (three) times daily with meals. Patient not taking: Reported on 04/13/2019 04/08/19   Roxan Hockey, MD  Multiple Vitamin (MULTIVITAMIN WITH MINERALS) TABS tablet Take 1 tablet by mouth daily. 04/08/19   Roxan Hockey, MD  omeprazole (PRILOSEC) 40 MG capsule Take 1 capsule (40 mg total) by mouth 2 (two) times daily. 05/06/19   Virl Cagey, MD  ondansetron (ZOFRAN) 4  MG tablet Take 1 tablet (4 mg total) by mouth every 6 (six) hours as needed for nausea. Patient not taking: Reported on 04/13/2019 04/08/19   Virl Cagey, MD  senna-docusate (SENOKOT-S) 8.6-50 MG tablet Take 2 tablets by mouth at bedtime. Patient not taking: Reported on 04/13/2019 04/08/19 06/07/19  Roxan Hockey, MD  sucralfate (CARAFATE) 1 g tablet Take 1 tablet (1 g total) by mouth 4 (four) times daily -  with meals and at bedtime. 05/06/19 06/05/19  Virl Cagey, MD  traZODone (DESYREL) 100 MG tablet Take 1 tablet (100 mg total) by mouth at bedtime. 04/08/19   Roxan Hockey, MD    Allergies    Patient has no known allergies.  Review of Systems   Review of Systems  Constitutional:       Per HPI, otherwise negative  HENT:       Per HPI, otherwise negative  Respiratory:       Per HPI, otherwise negative  Cardiovascular:       Per HPI, otherwise negative  Gastrointestinal: Positive for abdominal pain, nausea and vomiting.  Endocrine:       Negative aside from HPI  Genitourinary:       Neg aside from HPI   Musculoskeletal:       Per HPI, otherwise negative  Skin: Negative.   Neurological: Negative for syncope.    Physical Exam Updated Vital Signs BP 117/75 (BP Location: Left Arm)   Pulse 66   Temp 98.8 F (37.1 C) (Oral)   Resp 18   Ht 6' (1.829 m)   Wt 68 kg   SpO2 100%   BMI 20.34 kg/m   Physical Exam Vitals and nursing note reviewed.  Constitutional:      Appearance: He is ill-appearing and diaphoretic.     Comments: Uncomfortable appearing thin adult male awake and alert  HENT:     Head: Normocephalic and atraumatic.  Eyes:     Conjunctiva/sclera: Conjunctivae normal.  Cardiovascular:     Rate and Rhythm: Normal rate and regular rhythm.  Pulmonary:     Effort: Pulmonary effort is normal. No respiratory distress.     Breath sounds: No stridor.  Abdominal:     General: There is no distension.     Tenderness: There is generalized abdominal  tenderness and tenderness in the epigastric area and periumbilical area.     Comments: Midline scar just superior to the umbilicus about it that is tender, with guarding.  Skin:    General: Skin is warm.  Neurological:     Mental Status: He is alert and oriented to person, place, and time.     ED Results / Procedures / Treatments   Labs (all labs ordered are listed, but only abnormal results are displayed) Labs Reviewed  COMPREHENSIVE METABOLIC PANEL  LIPASE, BLOOD  CBC WITH DIFFERENTIAL/PLATELET  URINALYSIS, ROUTINE W REFLEX MICROSCOPIC    EKG None  Radiology No results found.  Procedures Procedures (including critical care time)  Medications Ordered in ED Medications  sodium chloride 0.9 % bolus 1,000 mL (has no administration in  time range)  0.9 %  sodium chloride infusion (has no administration in time range)  fentaNYL (SUBLIMAZE) injection 50 mcg (has no administration in time range)  ondansetron (ZOFRAN) injection 4 mg (has no administration in time range)    ED Course  I have reviewed the triage vital signs and the nursing notes.  Pertinent labs & imaging results that were available during my care of the patient were reviewed by me and considered in my medical decision making (see chart for details).    MDM Rules/Calculators/A&P     CHA2DS2/VAS Stroke Risk Points      N/A >= 2 Points: High Risk  1 - 1.99 Points: Medium Risk  0 Points: Low Risk    A final score could not be computed because of missing components.: Last  Change: N/A     This score determines the patient's risk of having a stroke if the  patient has atrial fibrillation.      This score is not applicable to this patient. Components are not  calculated.                   Chart review notable for prior similar ED visit within the past few weeks, surgery within the past 2 months. Initial labs reassuring aside from elevation in lipase.  This young adult male with history of recent Roux-en-Y  procedure presents with worsening abdominal pain, described as atypical compared to prior episodes of pancreatitis, or postsurgical pain. He is awake, alert, speaking clearly, not hypotensive, but speaking clearly. Patient found to have substantial abdominal pain, and with concern for postoperative infection versus obstruction versus pancreatitis, the patient has a CT scan pending. Dr. Laverta Baltimore is aware the patient, will follow his case.  Final Clinical Impression(s) / ED Diagnoses Final diagnoses:  Generalized abdominal pain     David Muskrat, MD 05/16/19 1526

## 2019-05-16 NOTE — H&P (Signed)
History and Physical    David Miranda FAO:[id number] DOB: [birth year] DOA: 05/16/2019  PCP: Patient, No Pcp Per   Patient coming from: Home  I have personally briefly reviewed patient's old medical records in [facility]  Chief Complaint: Abdominal Pain and Vomiting.   HPI: David Miranda is a 33 y.o. male with medical history significant for alcoholic pancreatitis, necrotizing pancreatitis, pseudocyst, hypertension.  Patient presented to the ED with complaints of abdominal pain of 2 days duration.  Abdominal pain is constant, mostly around his umbilicus.  He has had multiple episodes of vomiting since onset of his abdominal pain.  No loose stools.  His last bowel movement was yesterday, and it was hard. No fever no chills.  Reports he has not drank any alcohol since his abdominal surgery in November.  He reports his had tremors since quitting alcohol intake.  When asked how much he used to drink he replies " a lot".  Recent hospitalization 11/7-11/13 admitted again for worsening abdominal pain, with CT scan showing an enlarged 61-month that was fluid-filled in nature with pneumoperitoneum.  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  Hospitalization 10/25-11/3-for severe alcoholic esophagitis and gastritis/GOO, had gastrojejunostomy for gastric outlet obstruction 10/30.  Also with acute on chronic pancreatitis with history of pseudocyst.  ED Course: Stable vitals.  Unremarkable  CBC, CMP, lipase.  Abdominal CT pelvis with contrast-nonspecific findings concerning for liver contusion, mild wall thickening with adjacent inflammation of the gastrojejunostomy-infection, inflammatory or ischemic in origin.  Inflammatory change may be residual.  Edema.  Mild dilation of proximal jejunum to the level of the Roux-en-Y anastomosis, suspect a mild degree of adynamic ileus.  Low-grade partial obstruction is also possible.  Stable pancreatic cystic mass. EDP talked to Dr.  Lovell Sheehan on call for general surgery, findings likely post-op Inflammation, NG tube not advised, will see in a.m.  Patient is significant pain.  Hospitalist to admit for further management.  Review of Systems: As per HPI all other systems reviewed and negative.  Past Medical History:  Diagnosis Date  . GERD (gastroesophageal reflux disease)   . Pancreatitis     Past Surgical History:  Procedure Laterality Date  . ESOPHAGOGASTRODUODENOSCOPY (EGD) WITH PROPOFOL N/A 04/01/2019   Procedure: ESOPHAGOGASTRODUODENOSCOPY (EGD) WITH PROPOFOL;  Surgeon: West Bali, MD;  Location: [facility] ENDO SUITE;  Service: Endoscopy;  Laterality: N/A;  . GASTROJEJUNOSTOMY N/A 04/04/2019   Procedure: GASTROJEJUNOSTOMY;  Surgeon: Lucretia Roers, MD;  Location: [facility] ORS;  Service: General;  Laterality: N/A;  . GASTROJEJUNOSTOMY  04/14/2019   Procedure: REVISION GASTROJEJUNOSTOMY TO ROUX-EN-Y;  Surgeon: Lucretia Roers, MD;  Location: [facility] ORS;  Service: General;;  . NO PAST SURGERIES       reports that he has been smoking cigarettes. He has been smoking about 1.00 pack per day. He has never used smokeless tobacco. He reports current alcohol use. He reports previous drug use. Drug: Marijuana.  No Known Allergies  Family History  Problem Relation Age of Onset  . Cancer Other     Prior to Admission medications   Medication Sig Start Date End Date Taking? Authorizing Provider  HYDROcodone-acetaminophen (NORCO) 5-325 MG tablet Take 1 tablet by mouth every 4 (four) hours as needed for severe pain. 05/06/19  Yes Lucretia Roers, MD  lipase/protease/amylase (CREON) 36000 UNITS CPEP capsule Take 2 capsules (72,000 Units total) by mouth 3 (three) times daily with meals. 04/08/19  Yes Laveta Gilkey, [place], MD  omeprazole (PRILOSEC) 40 MG capsule Take 1 capsule (  40 mg total) by mouth 2 (two) times daily. 05/06/19  Yes Lucretia Roers, MD  sucralfate (CARAFATE) 1 g tablet Take 1 tablet (1 g total) by mouth 4 (four) times  daily -  with meals and at bedtime. 05/06/19 06/05/19 Yes Lucretia Roers, MD  traZODone (DESYREL) 100 MG tablet Take 1 tablet (100 mg total) by mouth at bedtime. 04/08/19  Yes Shon Hale, MD  acetaminophen (TYLENOL) 325 MG tablet Take 2 tablets (650 mg total) by mouth every 4 (four) hours as needed for mild pain, fever or headache. 04/08/19 04/07/20  Shon Hale, MD  Multiple Vitamin (MULTIVITAMIN WITH MINERALS) TABS tablet Take 1 tablet by mouth daily. 04/08/19   Shon Hale, MD  ondansetron (ZOFRAN) 4 MG tablet Take 1 tablet (4 mg total) by mouth every 6 (six) hours as needed for nausea. Patient not taking: Reported on 04/13/2019 04/08/19   Lucretia Roers, MD  senna-docusate (SENOKOT-S) 8.6-50 MG tablet Take 2 tablets by mouth at bedtime. Patient not taking: Reported on 04/13/2019 04/08/19 06/07/19  Shon Hale, MD    Physical Exam: Vitals:   05/16/19 1108 05/16/19 1109  BP: 117/75   Pulse: 66   Resp: 18   Temp: 98.8 F (37.1 C)   TempSrc: Oral   SpO2: 100%   Weight:  68 kg  Height:  6' (1.829 m)    Constitutional: writhing in severe pain, crying Vitals:   05/16/19 1108 05/16/19 1109  BP: 117/75   Pulse: 66   Resp: 18   Temp: 98.8 F (37.1 C)   TempSrc: Oral   SpO2: 100%   Weight:  68 kg  Height:  6' (1.829 m)   Eyes: PERRL, lids and conjunctivae normal ENMT: Mucous membranes are moist. Posterior pharynx clear of any exudate or lesions. Neck: normal, supple, no masses, no thyromegaly Respiratory: clear to auscultation bilaterally, no wheezing, no crackles. Normal respiratory effort. No accessory muscle use.  Cardiovascular: Regular rate and rhythm, no murmurs / rubs / gallops. No extremity edema. 2+ pedal pulses. No carotid bruits.  Abdomen: Abdomen soft, moderate abdominal tenderness, well-healed midline supraumbilical surgical scar, no masses palpated. No hepatosplenomegaly. Musculoskeletal: no clubbing / cyanosis. No joint deformity upper and lower  extremities. Good ROM, no contractures. Normal muscle tone.  Skin: no rashes, lesions, ulcers. No induration Neurologic: No gross cranial nerve deficits, moving all extremities spontaneously Psychiatric: Normal judgment and insight. Alert and oriented x 3.  Crying, in pain  Labs on Admission: I have personally reviewed following labs and imaging studies  CBC: Recent Labs  Lab 05/16/19 1309  WBC 10.4  NEUTROABS 6.7  HGB 11.1*  HCT 36.3*  MCV 79.6*  PLT 321   Basic Metabolic Panel: Recent Labs  Lab 05/16/19 1309  NA 138  K 3.8  CL 107  CO2 23  GLUCOSE 100*  BUN 12  CREATININE 0.61  CALCIUM 9.0   Liver Function Tests: Recent Labs  Lab 05/16/19 1309  AST 13*  ALT 12  ALKPHOS 65  BILITOT <0.1*  PROT 6.6  ALBUMIN 3.4*   Recent Labs  Lab 05/16/19 1309  LIPASE 73*   Urine analysis:    Component Value Date/Time   COLORURINE STRAW (A) 05/16/2019 1301   APPEARANCEUR CLEAR 05/16/2019 1301   LABSPEC 1.008 05/16/2019 1301   PHURINE 5.0 05/16/2019 1301   GLUCOSEU NEGATIVE 05/16/2019 1301   HGBUR NEGATIVE 05/16/2019 1301   BILIRUBINUR NEGATIVE 05/16/2019 1301   KETONESUR NEGATIVE 05/16/2019 1301   PROTEINUR NEGATIVE 05/16/2019 1301  NITRITE NEGATIVE 05/16/2019 1301   LEUKOCYTESUR NEGATIVE 05/16/2019 1301    Radiological Exams on Admission: CT Abdomen Pelvis W Contrast  Result Date: 05/16/2019 CLINICAL DATA:  Abdominal pain, nausea vomiting. History of a Roux-en-Y procedure last month. EXAM: CT ABDOMEN AND PELVIS WITH CONTRAST TECHNIQUE: Multidetector CT imaging of the abdomen and pelvis was performed using the standard protocol following bolus administration of intravenous contrast. CONTRAST:  OMNIPAQUE IOHEXOL 300 MG/ML  SOLN COMPARISON:  05/06/2019 FINDINGS: Lower chest: Heart normal in size. Mild hazy opacity in the dependent lung bases consistent with atelectasis. Lung bases otherwise clear. Mild distention of the distal esophagus.  Wall appears  prominent. Hepatobiliary: There is heterogeneous hypoattenuation at the liver most evident at the dome, between segments 7 and 8, but also more inferiorly along the lateral aspect of segment 7. This was not evident on the CT dated 04/13/2019. No defined liver mass. Small focus of stable focal fat adjacent to the falciform ligament. Gallbladder is unremarkable. No bile duct dilation. Pancreas: 2 cm cystic mass from the anterior superior pancreatic body, stable from the prior CTs. No other pancreatic masses. No inflammation. Spleen: Normal in size without focal abnormality. Adrenals/Urinary Tract: No adrenal masses. Kidneys are normal in size, orientation and position with symmetric enhancement and excretion. Two stable midpole left renal cysts. No other renal masses, no stones and no hydronephrosis. Ureters normal in course and in caliber. Bladder is unremarkable. Stomach/Bowel: Gastrojejunostomy. Anastomosis lies along the inferior margin of the greater curvature. The jejunum at this level shows mild wall thickening with adjacent hazy inflammation in mesentery. There is mild dilation of the proximal jejunum to the anastomosis of the Roux-en-Y loop in the central to lower abdomen. No other wall thickening or inflammation. The stomach is moderately distended. Fold superior prominent particularly along the greater curvature. Mild increased stool noted throughout the colon. No colonic dilation, wall thickening or inflammation. Normal appendix visualized. Vascular/Lymphatic: Mildly prominent mesenteric lymph nodes, none enlarged by size criteria. Vessels are unremarkable. Reproductive: Unremarkable. Other: Small amount of ascites most evident in the posterior pelvic recess. No abdominal wall hernia. Well-approximated midline incision. Musculoskeletal: No fracture or acute finding.  No bone lesion. IMPRESSION: 1. Abnormal areas of hypoattenuation in the liver, most evident at the dome of the of the right lobe, concerning  for liver contusion, but nonspecific. 2. Mild wall thickening with adjacent inflammation at the gastrojejunostomy and involving the proximal jejunum for a short length distal to the anastomosis. This may be due to infection or inflammation or be ischemic in origin. Some of the inflammatory change may be residual postoperative edema. 3. Mild dilation of the proximal jejunum to the level of the Roux-en-Y anastomosis in the low to mid abdomen. Suspect a mild degree of adynamic ileus. Low grade partial obstruction is also possible. 4. Small amount of ascites, increased compared to the prior CT. 5. Pancreatic cystic mass stable from the most recent prior CTs. This is likely a pseudocyst. Patient had pancreatitis on a scan dated 12/26/2017 with no cyst present at that time. Electronically Signed   By: Amie Portland M.D.   On: 05/16/2019 17:12    EKG: None  Assessment/Plan Active Problems:   Intractable abdominal pain   Intractable abdominal pain-vomiting, abdominal pain, without distention last bowel movement yesterday.  WBC 10.4.  Normal lipase 73.  History of alcoholic and necrotizing pancreatitis, pseudocyst.  Normal lipase, CT abdomen and pelvis with contrast (pls see detailed report) with nonspecific findings, mild wall thickening with adjacent  inflammation, possible mild degree of adynamic ileus. The degree of his abdominal pain is not correlating with CT findings. - EDP talked to Dr. Lovell Sheehan, likely postop findings, NG tube not advised, will see in a.m. - Bowel rest, n.p.o. - Given, continue D 5 N/s +20 KCl at 125 cc/h x 1 day -BMP, CBC a.m. -IV Dilaudid 1 mg every 4 hourly -Continuous pulse ox  Recent gastric outlet obstruction status post Roux-en-Y gastrojejunostomy 10/30 and then subsequent revision and 11/9.   History of alcohol abuse-reports no alcohol intake since surgery.  Reports chronic tremors since quitting alcohol intake.  -Ativan 0.5 g x 1 p.o. -Check magnesium,  phosphorus -Thiamine folate multivitamins  Hypertension-blood pressure stable.  Not on antihypertensives.   DVT prophylaxis: SCDs Code Status: Full code Family Communication: None at bedside Disposition Plan: Per rounding team Consults called: General surgery Admission status: Observation, MedSurg   Onnie Boer MD [facility]  05/16/2019, 9:22 PM

## 2019-05-16 NOTE — ED Triage Notes (Signed)
Pt c/o generalized abdominal pain for the last couple of days; pt states he has been vomiting and states he has not been able to sleep due to the pain; pt was brought in by [facility] and given fentnyl 12mcg IV en route

## 2019-05-16 NOTE — ED Provider Notes (Signed)
Blood pressure 117/75, pulse 66, temperature 98.8 F (37.1 C), temperature source Oral, resp. rate 18, height 6' (1.829 m), weight 68 kg, SpO2 100 %.  Assuming care from Dr. Vanita Panda.  In short, David Miranda is a 33 y.o. male with a chief complaint of Abdominal Pain .  Refer to the original H&P for additional details.  The current plan of care is to f/u CT and reassess.  05:43 PM  Reviewed the CT findings and discussed with Dr. Arnoldo Morale.  The inflammation and possible ileus discussed and not surgical.  The inflammation would be expected in the postop setting.  I suspect that the patient's pain is mostly related to pancreatitis type flare.  He has no complete bowel obstruction.  Dr. Arnoldo Morale does not advise NG tube.  I would back to reassess the patient and he is tearful and appears to be in significant pain.  I have ordered additional pain medicine but feel he will likely need admission for pain management, IV fluids.  Dr. Arnoldo Morale will consult in the morning.   Discussed patient's case with [facility] to request admission. Patient and family (if present) updated with plan. Care transferred to [facility] service.  I reviewed all nursing notes, vitals, pertinent old records, EKGs, labs, imaging (as available).     Margette Fast, MD 05/17/19 [phone]

## 2019-05-16 NOTE — ED Notes (Signed)
Attempted report. Nurse unavailable at this time.  

## 2019-05-17 LAB — BASIC METABOLIC PANEL
Anion gap: 10 (ref 5–15)
BUN: 5 mg/dL — ABNORMAL LOW (ref 6–20)
CO2: 22 mmol/L (ref 22–32)
Calcium: 8.7 mg/dL — ABNORMAL LOW (ref 8.9–10.3)
Chloride: 106 mmol/L (ref 98–111)
Creatinine, Ser: 0.57 mg/dL — ABNORMAL LOW (ref 0.61–1.24)
GFR calc Af Amer: >60 mL/min (ref >60)
GFR calc non Af Amer: >60 mL/min (ref >60)
Glucose, Bld: 104 mg/dL — ABNORMAL HIGH (ref 70–99)
Potassium: 3.5 mmol/L (ref 3.5–5.1)
Sodium: 138 mmol/L (ref 135–145)

## 2019-05-17 LAB — CBC
HCT: 35.1 % — ABNORMAL LOW (ref 39.0–52.0)
Hemoglobin: 10.8 g/dL — ABNORMAL LOW (ref 13.0–17.0)
MCH: 24 pg — ABNORMAL LOW (ref 26.0–34.0)
MCHC: 30.8 g/dL (ref 30.0–36.0)
MCV: 78 fL — ABNORMAL LOW (ref 80.0–100.0)
Platelets: 314 10*3/uL (ref 150–400)
RBC: 4.5 MIL/uL (ref 4.22–5.81)
RDW: 22.9 % — ABNORMAL HIGH (ref 11.5–15.5)
WBC: 8.5 10*3/uL (ref 4.0–10.5)
nRBC: 0 % (ref 0.0–0.2)

## 2019-05-17 LAB — SARS CORONAVIRUS 2 (TAT 6-24 HRS): SARS Coronavirus 2: NEGATIVE

## 2019-05-17 MED ORDER — TRAZODONE HCL 50 MG PO TABS
100.0000 mg | ORAL_TABLET | Freq: Once | ORAL | Status: AC
  Administered 2019-05-17: 04:00:00 100 mg via ORAL
  Filled 2019-05-17: qty 2

## 2019-05-17 MED ORDER — TRAZODONE HCL 100 MG PO TABS: 100.0000 mg | ORAL_TABLET | Freq: Every day | ORAL | 2 refills | Status: DC

## 2019-05-17 MED ORDER — METHOCARBAMOL 500 MG PO TABS: 500.0000 mg | ORAL_TABLET | Freq: Three times a day (TID) | ORAL | 0 refills | Status: DC | PRN

## 2019-05-17 MED ORDER — METHOCARBAMOL 500 MG PO TABS
500.0000 mg | ORAL_TABLET | Freq: Three times a day (TID) | ORAL | Status: DC | PRN
  Administered 2019-05-17: 10:00:00 500 mg via ORAL
  Filled 2019-05-17: qty 1

## 2019-05-17 NOTE — Discharge Summary (Signed)
Physician Discharge Summary  David Miranda [id number] DOB: August 13, 1985 DOA: 05/16/2019  PCP: Patient, No Pcp Per  Admit date: 05/16/2019 Discharge date: 05/17/2019  Time spent: 35 minutes  Recommendations for Outpatient Follow-up:  1. Patient will benefit of pain clinic management as an outpatient 2. Repeat complete metabolic panel to follow electrolytes, LFTs and renal function.   Discharge Diagnoses:  Active Problems:   Intractable abdominal pain Hypertension Chronic pancreatitis Gastroesophageal reflux disease  Discharge Condition: Stable and improved.  Patient discharged home with instruction to follow-up as previously scheduled with general surgery as an outpatient.  Diet recommendation: Small multiple amount, low-fat diet.  Filed Weights   05/16/19 1109 05/16/19 2349  Weight: 68 kg 67.1 kg    History of present illness:  As per H&P written by Dr. Denton Brick on 05/16/2019 33 y.o. male with medical history significant for alcoholic pancreatitis, necrotizing pancreatitis, pseudocyst, hypertension.  Patient presented to the ED with complaints of abdominal pain of 2 days duration.  Abdominal pain is constant, mostly around his umbilicus.  He has had multiple episodes of vomiting since onset of his abdominal pain.  No loose stools.  His last bowel movement was yesterday, and it was hard. No fever no chills.  Reports he has not drank any alcohol since his abdominal surgery in November.  He reports his had tremors since quitting alcohol intake.  When asked how much he used to drink he replies " a lot".  Recent hospitalization 11/7-11/13 admitted again for worsening abdominal pain, with CT scan showing an enlarged 66-month that was fluid-filled in nature with pneumoperitoneum.  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  Hospital Course:  1-intractable abdominal pain -CT scan demonstrating no acute on normalities to explain patient's  discomfort -Case discussed with general surgery who felt no postsurgical complications or abnormalities that would require intervention currently. -Patient pain most likely associated with chronic pancreatitis. -At time of discharge, no nausea, no vomiting, tolerating diet. -Still having pain medication but expressed to be better controlled. -Patient will continue home Creon, as needed Vicodin, and has been discharge on as needed Robaxin. -Instructed to follow small multiple meals throughout the day and to do his best to avoid fatty food.  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 -no obstruction seen on CT -patient having daily BM's -Continue patient follow-up with general surgery as previously scheduled. -Continue the use of PPI.  3-hx of alcohol abuse -patient expressed no further alcohol consumption since November admission. -congratulated and asked to maintain complete abstinence.  4-HTN -BP stable -not using antihypertensive regimen   5-GERD -continue PPI  6-chronic pancreatitis -Continue the use of Creon  7-insomnia -Continue the use of trazodone.  Procedures:  See below for x-ray report.  Consultations:  General surgery  Discharge Exam: Vitals:   05/17/19 0608 05/17/19 1416  BP: 106/86 122/80  Pulse: (!) 55 60  Resp:  20  Temp: 98 F (36.7 C) 97.6 F (36.4 C)  SpO2: 100% 100%    General: Afebrile, no chest pain, no further episode of nausea vomiting.  Tolerated diet prior to discharge.  Continued to experience abdominal pain, expressed to be better controlled. Cardiovascular: S1 and S2, no rubs, no gallops, no murmur. Respiratory: Clear to auscultation bilaterally Abdomen: Soft and flat.  Patient reports tenderness to palpation, migratory and diffuse.  No rigidity.  Positive bowel  sounds Extremities: No cyanosis, no clubbing.  Discharge Instructions   Discharge Instructions    Diet - low sodium heart healthy   Complete by: As directed    Discharge instructions   Complete by: As directed    Follow low fat/soft diet. Maintain adequate hydration. Take medications as prescribed. Follow-up as previously scheduled with general surgery.     Allergies as of 05/17/2019   No Known Allergies     Medication List    STOP taking these medications   ondansetron 4 MG tablet Commonly known as: ZOFRAN   sucralfate 1 g tablet Commonly known as: Carafate     TAKE these medications   acetaminophen 325 MG tablet Commonly known as: Tylenol Take 2 tablets (650 mg total) by mouth every 4 (four) hours as needed for mild pain, fever or headache.   HYDROcodone-acetaminophen 5-325 MG tablet Commonly known as: Norco Take 1 tablet by mouth every 4 (four) hours as needed for severe pain.   lipase/protease/amylase 36000 UNITS Cpep capsule Commonly known as: CREON Take 2 capsules (72,000 Units total) by mouth 3 (three) times daily with meals.   methocarbamol 500 MG tablet Commonly known as: ROBAXIN Take 1 tablet (500 mg total) by mouth every 8 (eight) hours as needed for muscle spasms.   multivitamin with minerals Tabs tablet Take 1 tablet by mouth daily.   omeprazole 40 MG capsule Commonly known as: PriLOSEC Take 1 capsule (40 mg total) by mouth 2 (two) times daily.   senna-docusate 8.6-50 MG tablet Commonly known as: Senokot-S Take 2 tablets by mouth at bedtime.   traZODone 100 MG tablet Commonly known as: DESYREL Take 1 tablet (100 mg total) by mouth at bedtime.      No Known Allergies   The results of significant diagnostics from this hospitalization (including imaging, microbiology, ancillary and laboratory) are listed below for reference.    Significant Diagnostic Studies: CT Abdomen Pelvis W Contrast  Result Date: 05/16/2019 CLINICAL DATA:   Abdominal pain, nausea vomiting. History of a Roux-en-Y procedure last month. EXAM: CT ABDOMEN AND PELVIS WITH CONTRAST TECHNIQUE: Multidetector CT imaging of the abdomen and pelvis was performed using the standard protocol following bolus administration of intravenous contrast. CONTRAST:  118mL OMNIPAQUE IOHEXOL 300 MG/ML  SOLN COMPARISON:  05/06/2019 FINDINGS: Lower chest: Heart normal in size. Mild hazy opacity in the dependent lung bases consistent with atelectasis. Lung bases otherwise clear. Mild distention of the distal esophagus.  Wall appears prominent. Hepatobiliary: There is heterogeneous hypoattenuation at the liver most evident at the dome, between segments 7 and 8, but also more inferiorly along the lateral aspect of segment 7. This was not evident on the CT dated 04/13/2019. No defined liver mass. Small focus of stable focal fat adjacent to the falciform ligament. Gallbladder is unremarkable. No bile duct dilation. Pancreas: 2 cm cystic mass from the anterior superior pancreatic body, stable from the prior CTs. No other pancreatic masses. No inflammation. Spleen: Normal in size without focal abnormality. Adrenals/Urinary Tract: No adrenal masses. Kidneys are normal in size, orientation and position with symmetric enhancement and excretion. Two stable midpole left renal cysts. No other renal masses, no stones and no hydronephrosis. Ureters normal in course and in caliber. Bladder is unremarkable. Stomach/Bowel: Gastrojejunostomy. Anastomosis lies along the inferior margin of the greater curvature. The jejunum at this level shows mild wall thickening with adjacent hazy inflammation in mesentery. There is mild dilation of the proximal jejunum to the anastomosis of the Roux-en-Y loop in the central to lower abdomen. No other wall thickening or  inflammation. The stomach is moderately distended. Fold superior prominent particularly along the greater curvature. Mild increased stool noted throughout the  colon. No colonic dilation, wall thickening or inflammation. Normal appendix visualized. Vascular/Lymphatic: Mildly prominent mesenteric lymph nodes, none enlarged by size criteria. Vessels are unremarkable. Reproductive: Unremarkable. Other: Small amount of ascites most evident in the posterior pelvic recess. No abdominal wall hernia. Well-approximated midline incision. Musculoskeletal: No fracture or acute finding.  No bone lesion. IMPRESSION: 1. Abnormal areas of hypoattenuation in the liver, most evident at the dome of the of the right lobe, concerning for liver contusion, but nonspecific. 2. Mild wall thickening with adjacent inflammation at the gastrojejunostomy and involving the proximal jejunum for a short length distal to the anastomosis. This may be due to infection or inflammation or be ischemic in origin. Some of the inflammatory change may be residual postoperative edema. 3. Mild dilation of the proximal jejunum to the level of the Roux-en-Y anastomosis in the low to mid abdomen. Suspect a mild degree of adynamic ileus. Low grade partial obstruction is also possible. 4. Small amount of ascites, increased compared to the prior CT. 5. Pancreatic cystic mass stable from the most recent prior CTs. This is likely a pseudocyst. Patient had pancreatitis on a scan dated 12/26/2017 with no cyst present at that time. Electronically Signed   By: Lajean Manes M.D.   On: 05/16/2019 17:12   CT Abdomen Pelvis W Contrast  Result Date: 05/06/2019 CLINICAL DATA:  Generalized abdominal pain. EXAM: CT ABDOMEN AND PELVIS WITH CONTRAST TECHNIQUE: Multidetector CT imaging of the abdomen and pelvis was performed using the standard protocol following bolus administration of intravenous contrast. CONTRAST:  185mL OMNIPAQUE IOHEXOL 300 MG/ML  SOLN COMPARISON:  April 13, 2019. FINDINGS: Lower chest: Visualized lung bases are unremarkable. Stable diffuse wall thickening of distal esophagus is noted concerning for  esophagitis. Hepatobiliary: No focal liver abnormality is seen. No gallstones, gallbladder wall thickening, or biliary dilatation. Pancreas: Stable 2 cm cystic abnormality seen in pancreatic head. No inflammation or ductal dilatation is noted. Spleen: Normal in size without focal abnormality. Adrenals/Urinary Tract: Adrenal glands are unremarkable. Kidneys are normal, without renal calculi, focal lesion, or hydronephrosis. Bladder is unremarkable. Stomach/Bowel: Moderate gastric distention is noted. Findings consistent with previous gastrojejunostomy. There is no definite evidence of bowel obstruction. The appendix appears normal. There remains some wall thickening and inflammation involving the jejunum just distal to the gastrojejunal anastomosis. Vascular/Lymphatic: No significant vascular findings are present. No enlarged abdominal or pelvic lymph nodes. Reproductive: Prostate is unremarkable. Other: No abdominal wall hernia or abnormality. No abdominopelvic ascites. Musculoskeletal: No acute or significant osseous findings. IMPRESSION: 1. Stable diffuse wall thickening of distal esophagus is noted concerning for esophagitis. 2. Stable 2 cm cystic abnormality seen in pancreatic head. Follow-up with MRI in 1 year is recommended to ensure stability. 3. Moderate gastric distention is noted. Status post gastrojejunostomy. There remains some wall thickening and inflammation involving the jejunum just distal to the gastrojejunal anastomosis concerning for inflammatory or infectious enteritis. 4. No other significant abnormality seen in the abdomen or pelvis. Electronically Signed   By: Marijo Conception M.D.   On: 05/06/2019 08:29    Microbiology: Recent Results (from the past 240 hour(s))  SARS CORONAVIRUS 2 (TAT 6-24 HRS) Nasopharyngeal Nasopharyngeal Swab     Status: None   Collection Time: 05/16/19 11:13 PM   Specimen: Nasopharyngeal Swab  Result Value Ref Range Status   SARS Coronavirus 2 NEGATIVE NEGATIVE  Final  Comment: (NOTE) SARS-CoV-2 target nucleic acids are NOT DETECTED. The SARS-CoV-2 RNA is generally detectable in upper and lower respiratory specimens during the acute phase of infection. Negative results do not preclude SARS-CoV-2 infection, do not rule out co-infections with other pathogens, and should not be used as the sole basis for treatment or other patient management decisions. Negative results must be combined with clinical observations, patient history, and epidemiological information. The expected result is Negative. Fact Sheet for Patients: SugarRoll.be Fact Sheet for Healthcare Providers: https://www.woods-mathews.com/ This test is not yet approved or cleared by the [place] FDA and  has been authorized for detection and/or diagnosis of SARS-CoV-2 by FDA under an Emergency Use Authorization (EUA). This EUA will remain  in effect (meaning this test can be used) for the duration of the COVID-19 declaration under Section 56 4(b)(1) of the Act, 21 U.S.C. section 360bbb-3(b)(1), unless the authorization is terminated or revoked sooner. Performed at [facility], [place] [address] [place], [place] [address]      Labs: Basic Metabolic Panel: Recent Labs  Lab 05/16/19 1309 05/17/19 0536  NA 138 138  K 3.8 3.5  CL 107 106  CO2 23 22  GLUCOSE 100* 104*  BUN 12 5*  CREATININE 0.61 0.57*  CALCIUM 9.0 8.7*  MG 1.9  --   PHOS 3.1  --    Liver Function Tests: Recent Labs  Lab 05/16/19 1309  AST 13*  ALT 12  ALKPHOS 65  BILITOT <0.1*  PROT 6.6  ALBUMIN 3.4*   Recent Labs  Lab 05/16/19 1309  LIPASE 73*   CBC: Recent Labs  Lab 05/16/19 1309 05/17/19 0536  WBC 10.4 8.5  NEUTROABS 6.7  --   HGB 11.1* 10.8*  HCT 36.3* 35.1*  MCV 79.6* 78.0*  PLT 321 314    Signed:  Barton Dubois MD.  [facility] 05/17/2019, 3:23 PM

## 2019-05-17 NOTE — Progress Notes (Signed)
Nsg Discharge Note  Admit Date:  05/16/2019 Discharge date: 05/17/2019   Devonta Bohren to be D/C'd home per MD order.  AVS completed.  Copy for chart, and copy for patient signed, and dated. Patient/caregiver able to verbalize understanding.  Discharge Medication: Allergies as of 05/17/2019   No Known Allergies     Medication List    STOP taking these medications   ondansetron 4 MG tablet Commonly known as: ZOFRAN   sucralfate 1 g tablet Commonly known as: Carafate     TAKE these medications   acetaminophen 325 MG tablet Commonly known as: Tylenol Take 2 tablets (650 mg total) by mouth every 4 (four) hours as needed for mild pain, fever or headache.   HYDROcodone-acetaminophen 5-325 MG tablet Commonly known as: Norco Take 1 tablet by mouth every 4 (four) hours as needed for severe pain.   lipase/protease/amylase 36000 UNITS Cpep capsule Commonly known as: CREON Take 2 capsules (72,000 Units total) by mouth 3 (three) times daily with meals.   methocarbamol 500 MG tablet Commonly known as: ROBAXIN Take 1 tablet (500 mg total) by mouth every 8 (eight) hours as needed for muscle spasms.   multivitamin with minerals Tabs tablet Take 1 tablet by mouth daily.   omeprazole 40 MG capsule Commonly known as: PriLOSEC Take 1 capsule (40 mg total) by mouth 2 (two) times daily.   senna-docusate 8.6-50 MG tablet Commonly known as: Senokot-S Take 2 tablets by mouth at bedtime.   traZODone 100 MG tablet Commonly known as: DESYREL Take 1 tablet (100 mg total) by mouth at bedtime.       Discharge Assessment: Vitals:   05/17/19 0608 05/17/19 1416  BP: 106/86 122/80  Pulse: (!) 55 60  Resp:  20  Temp: 98 F (36.7 C) 97.6 F (36.4 C)  SpO2: 100% 100%   Skin clean, dry and intact without evidence of skin break down, no evidence of skin tears noted. IV catheter discontinued intact. Site without signs and symptoms of complications - no redness or edema noted at insertion  site, patient denies c/o pain - only slight tenderness at site.  Dressing with slight pressure applied.  D/c Instructions-Education: Discharge instructions given to patient/family with verbalized understanding. D/c education completed with patient/family including follow up instructions, medication list, d/c activities limitations if indicated, with other d/c instructions as indicated by MD - patient able to verbalize understanding, all questions fully answered. Patient instructed to return to ED, call 911, or call MD for any changes in condition.  Patient escorted via [place], and D/C home via private auto.  Carney Corners, RN 05/17/2019 3:27 PM

## 2019-05-17 NOTE — Consult Note (Signed)
Reason for Consult: Abdominal pain, nausea Referring Physician: Dr. Harrie Jeans Schneekloth is an 33 y.o. male.  HPI: Patient is a 33 year old white male well-known to our service, status post gastrojejunostomy for gastroparesis and chronic pancreatitis who presents to the emergency room with intractable abdominal pain and nausea.  Patient has had multiple visits to the emergency room for abdominal pain.  CT scan of the abdomen revealed a pancreatic pseudocyst and normal postoperative changes, status post gastrojejunostomy.  Patient states he has been having daily bowel movements.  He states he had an episode of emesis yesterday evening, but that was before he came to the hospital.  This morning, he points to various areas of his abdomen and upper chest that are causing him pain.  He states that sometimes he has lower abdominal pain after having bowel movements.  He is hungry and wants to take a shower.  He states his pain level is 10 out of 10 and would like muscle relaxants and Ativan in addition to narcotics.  He denies any fever or chills.  Past Medical History:  Diagnosis Date  . GERD (gastroesophageal reflux disease)   . Pancreatitis     Past Surgical History:  Procedure Laterality Date  . ESOPHAGOGASTRODUODENOSCOPY (EGD) WITH PROPOFOL N/A 04/01/2019   Procedure: ESOPHAGOGASTRODUODENOSCOPY (EGD) WITH PROPOFOL;  Surgeon: Danie Binder, MD;  Location: [facility];  Service: Endoscopy;  Laterality: N/A;  . GASTROJEJUNOSTOMY N/A 04/04/2019   Procedure: GASTROJEJUNOSTOMY;  Surgeon: Virl Cagey, MD;  Location: [facility];  Service: General;  Laterality: N/A;  . GASTROJEJUNOSTOMY  04/14/2019   Procedure: REVISION GASTROJEJUNOSTOMY TO ROUX-EN-Y;  Surgeon: Virl Cagey, MD;  Location: [facility];  Service: General;;  . NO PAST SURGERIES      Family History  Problem Relation Age of Onset  . Cancer Other     Social History:  reports that he has been smoking cigarettes. He has been smoking  about 1.00 pack per day. He has never used smokeless tobacco. He reports current alcohol use. He reports previous drug use. Drug: Marijuana.  Allergies: No Known Allergies  Medications: I have reviewed the patient's current medications.  Results for orders placed or performed during the hospital encounter of 05/16/19 (from the past 48 hour(s))  Urinalysis, Routine w reflex microscopic     Status: Abnormal   Collection Time: 05/16/19  1:01 PM  Result Value Ref Range   Color, Urine STRAW (A) YELLOW   APPearance CLEAR CLEAR   Specific Gravity, Urine 1.008 1.005 - 1.030   pH 5.0 5.0 - 8.0   Glucose, UA NEGATIVE NEGATIVE mg/dL   Hgb urine dipstick NEGATIVE NEGATIVE   Bilirubin Urine NEGATIVE NEGATIVE   Ketones, ur NEGATIVE NEGATIVE mg/dL   Protein, ur NEGATIVE NEGATIVE mg/dL   Nitrite NEGATIVE NEGATIVE   Leukocytes,Ua NEGATIVE NEGATIVE    Comment: Performed at [facility], [address] [place],  [address]  Comprehensive metabolic panel     Status: Abnormal   Collection Time: 05/16/19  1:09 PM  Result Value Ref Range   Sodium 138 135 - 145 mmol/L   Potassium 3.8 3.5 - 5.1 mmol/L   Chloride 107 98 - 111 mmol/L   CO2 23 22 - 32 mmol/L   Glucose, Bld 100 (H) 70 - 99 mg/dL   BUN 12 6 - 20 mg/dL   Creatinine, Ser 0.61 0.61 - 1.24 mg/dL   Calcium 9.0 8.9 - 10.3 mg/dL   Total Protein 6.6 6.5 - 8.1 g/dL  Albumin 3.4 (L) 3.5 - 5.0 g/dL   AST 13 (L) 15 - 41 U/L   ALT 12 0 - 44 U/L   Alkaline Phosphatase 65 38 - 126 U/L   Total Bilirubin <0.1 (L) 0.3 - 1.2 mg/dL   GFR calc non Af Amer >60 >60 mL/min   GFR calc Af Amer >60 >60 mL/min   Anion gap 8 5 - 15    Comment: Performed at [facility], [address], [place], [place] [address]  Lipase, blood     Status: Abnormal   Collection Time: 05/16/19  1:09 PM  Result Value Ref Range   Lipase 73 (H) 11 - 51 U/L    Comment: Performed at [facility], [address], [place], [place] [address]  CBC WITH DIFFERENTIAL     Status:  Abnormal   Collection Time: 05/16/19  1:09 PM  Result Value Ref Range   WBC 10.4 4.0 - 10.5 K/uL   RBC 4.56 4.22 - 5.81 MIL/uL   Hemoglobin 11.1 (L) 13.0 - 17.0 g/dL   HCT 36.3 (L) 39.0 - 52.0 %   MCV 79.6 (L) 80.0 - 100.0 fL   MCH 24.3 (L) 26.0 - 34.0 pg   MCHC 30.6 30.0 - 36.0 g/dL   RDW 23.4 (H) 11.5 - 15.5 %   Platelets 321 150 - 400 K/uL   nRBC 0.0 0.0 - 0.2 %   Neutrophils Relative % 63 %   Neutro Abs 6.7 1.7 - 7.7 K/uL   Lymphocytes Relative 27 %   Lymphs Abs 2.8 0.7 - 4.0 K/uL   Monocytes Relative 7 %   Monocytes Absolute 0.7 0.1 - 1.0 K/uL   Eosinophils Relative 2 %   Eosinophils Absolute 0.2 0.0 - 0.5 K/uL   Basophils Relative 1 %   Basophils Absolute 0.1 0.0 - 0.1 K/uL   Immature Granulocytes 0 %   Abs Immature Granulocytes 0.03 0.00 - 0.07 K/uL    Comment: Performed at [facility], [address], [place], [place] [address]  Magnesium     Status: None   Collection Time: 05/16/19  1:09 PM  Result Value Ref Range   Magnesium 1.9 1.7 - 2.4 mg/dL    Comment: Performed at [facility], [address], [place], [place] [address]  Phosphorus     Status: None   Collection Time: 05/16/19  1:09 PM  Result Value Ref Range   Phosphorus 3.1 2.5 - 4.6 mg/dL    Comment: Performed at [facility], [address] [place], [place] [address]  Ethanol     Status: None   Collection Time: 05/16/19 10:43 PM  Result Value Ref Range   Alcohol, Ethyl (B) <10 <10 mg/dL    Comment: (NOTE) Lowest detectable limit for serum alcohol is 10 mg/dL. For medical purposes only. Performed at [facility], [address], [place],  [id number]   Basic metabolic panel     Status: Abnormal   Collection Time: 05/17/19  5:36 AM  Result Value Ref Range   Sodium 138 135 - 145 mmol/L   Potassium 3.5 3.5 - 5.1 mmol/L   Chloride 106 98 - 111 mmol/L   CO2 22 22 - 32 mmol/L   Glucose, Bld 104 (H) 70 - 99 mg/dL   BUN 5 (L) 6 - 20 mg/dL   Creatinine, Ser 0.57 (L) 0.61 - 1.24 mg/dL   Calcium 8.7 (L)  8.9 - 10.3 mg/dL   GFR calc non Af Amer >60 >60 mL/min   GFR  calc Af Amer >60 >60 mL/min   Anion gap 10 5 - 15    Comment: Performed at [facility], [address], [place], [place] [address]  CBC     Status: Abnormal   Collection Time: 05/17/19  5:36 AM  Result Value Ref Range   WBC 8.5 4.0 - 10.5 K/uL   RBC 4.50 4.22 - 5.81 MIL/uL   Hemoglobin 10.8 (L) 13.0 - 17.0 g/dL   HCT 35.1 (L) 39.0 - 52.0 %   MCV 78.0 (L) 80.0 - 100.0 fL   MCH 24.0 (L) 26.0 - 34.0 pg   MCHC 30.8 30.0 - 36.0 g/dL   RDW 22.9 (H) 11.5 - 15.5 %   Platelets 314 150 - 400 K/uL   nRBC 0.0 0.0 - 0.2 %    Comment: Performed at [facility], [address], [place], [place] [address]    CT Abdomen Pelvis W Contrast  Result Date: 05/16/2019 CLINICAL DATA:  Abdominal pain, nausea vomiting. History of a Roux-en-Y procedure last month. EXAM: CT ABDOMEN AND PELVIS WITH CONTRAST TECHNIQUE: Multidetector CT imaging of the abdomen and pelvis was performed using the standard protocol following bolus administration of intravenous contrast. CONTRAST:  175mL OMNIPAQUE IOHEXOL 300 MG/ML  SOLN COMPARISON:  05/06/2019 FINDINGS: Lower chest: Heart normal in size. Mild hazy opacity in the dependent lung bases consistent with atelectasis. Lung bases otherwise clear. Mild distention of the distal esophagus.  Wall appears prominent. Hepatobiliary: There is heterogeneous hypoattenuation at the liver most evident at the dome, between segments 7 and 8, but also more inferiorly along the lateral aspect of segment 7. This was not evident on the CT dated 04/13/2019. No defined liver mass. Small focus of stable focal fat adjacent to the falciform ligament. Gallbladder is unremarkable. No bile duct dilation. Pancreas: 2 cm cystic mass from the anterior superior pancreatic body, stable from the prior CTs. No other pancreatic masses. No inflammation. Spleen: Normal in size without focal abnormality. Adrenals/Urinary Tract: No adrenal masses. Kidneys are  normal in size, orientation and position with symmetric enhancement and excretion. Two stable midpole left renal cysts. No other renal masses, no stones and no hydronephrosis. Ureters normal in course and in caliber. Bladder is unremarkable. Stomach/Bowel: Gastrojejunostomy. Anastomosis lies along the inferior margin of the greater curvature. The jejunum at this level shows mild wall thickening with adjacent hazy inflammation in mesentery. There is mild dilation of the proximal jejunum to the anastomosis of the Roux-en-Y loop in the central to lower abdomen. No other wall thickening or inflammation. The stomach is moderately distended. Fold superior prominent particularly along the greater curvature. Mild increased stool noted throughout the colon. No colonic dilation, wall thickening or inflammation. Normal appendix visualized. Vascular/Lymphatic: Mildly prominent mesenteric lymph nodes, none enlarged by size criteria. Vessels are unremarkable. Reproductive: Unremarkable. Other: Small amount of ascites most evident in the posterior pelvic recess. No abdominal wall hernia. Well-approximated midline incision. Musculoskeletal: No fracture or acute finding.  No bone lesion. IMPRESSION: 1. Abnormal areas of hypoattenuation in the liver, most evident at the dome of the of the right lobe, concerning for liver contusion, but nonspecific. 2. Mild wall thickening with adjacent inflammation at the gastrojejunostomy and involving the proximal jejunum for a short length distal to the anastomosis. This may be due to infection or inflammation or be ischemic in origin. Some of the inflammatory change may be residual postoperative edema. 3. Mild dilation of the proximal jejunum to the level of the Roux-en-Y anastomosis in the low to  mid abdomen. Suspect a mild degree of adynamic ileus. Low grade partial obstruction is also possible. 4. Small amount of ascites, increased compared to the prior CT. 5. Pancreatic cystic mass stable  from the most recent prior CTs. This is likely a pseudocyst. Patient had pancreatitis on a scan dated 12/26/2017 with no cyst present at that time. Electronically Signed   By: Lajean Manes M.D.   On: 05/16/2019 17:12    ROS:  Pertinent items are noted in HPI.  Blood pressure 106/86, pulse (!) 55, temperature 98 F (36.7 C), resp. rate 16, height 6' (1.829 m), weight 67.1 kg, SpO2 100 %. Physical Exam: Anxious white male in no acute distress, but sits up and complains of pain Head is normocephalic, atraumatic Lungs clear to auscultation with good breath sounds bilaterally Heart examination reveals regular rate and rhythm without S3, S4, murmurs Abdomen is soft and flat.  He has migratory pain to palpation.  No rigidity is noted.  CT scan images personally reviewed Elevated lipase noted Assessment/Plan: Impression: Chronic abdominal pain secondary to pancreatitis.  No postoperative surgical complications noted. Plan: Nothing to offer from the surgical standpoint at this time.  Discussed with Dr. Dyann Kief.  Patient has a follow-up visit with Dr. Constance Haw on 06/03/2019.  Aviva Signs 05/17/2019, 10:38 AM

## 2019-05-23 ENCOUNTER — Other Ambulatory Visit: Payer: Self-pay

## 2019-05-23 ENCOUNTER — Encounter (HOSPITAL_COMMUNITY): Payer: Self-pay | Admitting: Emergency Medicine

## 2019-05-23 ENCOUNTER — Emergency Department (HOSPITAL_COMMUNITY)
Admission: EM | Admit: 2019-05-23 | Discharge: 2019-05-23 | Disposition: A | Discharge status: Home / Self Care | Payer: Self-pay | Attending: Emergency Medicine | Admitting: Emergency Medicine

## 2019-05-23 DIAGNOSIS — F17210 Nicotine dependence, cigarettes, uncomplicated: Secondary | ICD-10-CM | POA: Insufficient documentation

## 2019-05-23 DIAGNOSIS — R1013 Epigastric pain: Secondary | ICD-10-CM | POA: Insufficient documentation

## 2019-05-23 DIAGNOSIS — Z79899 Other long term (current) drug therapy: Secondary | ICD-10-CM | POA: Insufficient documentation

## 2019-05-23 LAB — CBC WITH DIFFERENTIAL/PLATELET
Abs Immature Granulocytes: 0.09 10*3/uL — ABNORMAL HIGH (ref 0.00–0.07)
Basophils Absolute: 0.1 10*3/uL (ref 0.0–0.1)
Basophils Relative: 1 %
Eosinophils Absolute: 0.1 10*3/uL (ref 0.0–0.5)
Eosinophils Relative: 1 %
HCT: 39 % (ref 39.0–52.0)
Hemoglobin: 12.1 g/dL — ABNORMAL LOW (ref 13.0–17.0)
Immature Granulocytes: 1 %
Lymphocytes Relative: 23 %
Lymphs Abs: 2.8 10*3/uL (ref 0.7–4.0)
MCH: 24.2 pg — ABNORMAL LOW (ref 26.0–34.0)
MCHC: 31 g/dL (ref 30.0–36.0)
MCV: 78.2 fL — ABNORMAL LOW (ref 80.0–100.0)
Monocytes Absolute: 0.9 10*3/uL (ref 0.1–1.0)
Monocytes Relative: 7 %
Neutro Abs: 8.5 10*3/uL — ABNORMAL HIGH (ref 1.7–7.7)
Neutrophils Relative %: 67 %
Platelets: 499 10*3/uL — ABNORMAL HIGH (ref 150–400)
RBC: 4.99 MIL/uL (ref 4.22–5.81)
RDW: 23.7 % — ABNORMAL HIGH (ref 11.5–15.5)
WBC: 12.5 10*3/uL — ABNORMAL HIGH (ref 4.0–10.5)
nRBC: 0 % (ref 0.0–0.2)

## 2019-05-23 LAB — COMPREHENSIVE METABOLIC PANEL
ALT: 14 U/L (ref 0–44)
AST: 15 U/L (ref 15–41)
Albumin: 3.8 g/dL (ref 3.5–5.0)
Alkaline Phosphatase: 73 U/L (ref 38–126)
Anion gap: 9 (ref 5–15)
BUN: 18 mg/dL (ref 6–20)
CO2: 24 mmol/L (ref 22–32)
Calcium: 9.2 mg/dL (ref 8.9–10.3)
Chloride: 105 mmol/L (ref 98–111)
Creatinine, Ser: 0.77 mg/dL (ref 0.61–1.24)
GFR calc Af Amer: >60 mL/min (ref >60)
GFR calc non Af Amer: >60 mL/min (ref >60)
Glucose, Bld: 120 mg/dL — ABNORMAL HIGH (ref 70–99)
Potassium: 4.4 mmol/L (ref 3.5–5.1)
Sodium: 138 mmol/L (ref 135–145)
Total Bilirubin: 0.3 mg/dL (ref 0.3–1.2)
Total Protein: 7.1 g/dL (ref 6.5–8.1)

## 2019-05-23 LAB — URINALYSIS, ROUTINE W REFLEX MICROSCOPIC
Bilirubin Urine: NEGATIVE
Glucose, UA: NEGATIVE mg/dL
Hgb urine dipstick: NEGATIVE
Ketones, ur: NEGATIVE mg/dL
Leukocytes,Ua: NEGATIVE
Nitrite: NEGATIVE
Protein, ur: NEGATIVE mg/dL
Specific Gravity, Urine: 1.012 (ref 1.005–1.030)
pH: 5 (ref 5.0–8.0)

## 2019-05-23 LAB — LIPASE, BLOOD: Lipase: 98 U/L — ABNORMAL HIGH (ref 11–51)

## 2019-05-23 LAB — ETHANOL: Alcohol, Ethyl (B): <10 mg/dL (ref <10)

## 2019-05-23 MED ORDER — DICYCLOMINE HCL 10 MG/ML IM SOLN
20.0000 mg | Freq: Once | INTRAMUSCULAR | Status: AC
  Administered 2019-05-23: 20 mg via INTRAMUSCULAR
  Filled 2019-05-23: qty 2

## 2019-05-23 MED ORDER — SODIUM CHLORIDE 0.9 % IV BOLUS
1000.0000 mL | Freq: Once | INTRAVENOUS | Status: AC
  Administered 2019-05-23: 1000 mL via INTRAVENOUS

## 2019-05-23 MED ORDER — ONDANSETRON HCL 4 MG/2ML IJ SOLN
4.0000 mg | Freq: Once | INTRAMUSCULAR | Status: AC
  Administered 2019-05-23: 4 mg via INTRAVENOUS
  Filled 2019-05-23: qty 2

## 2019-05-23 MED ORDER — HYDROCODONE-ACETAMINOPHEN 5-325 MG PO TABS
2.0000 | ORAL_TABLET | Freq: Once | ORAL | Status: AC
  Administered 2019-05-23: 2 via ORAL
  Filled 2019-05-23: qty 2

## 2019-05-23 MED ORDER — DICYCLOMINE HCL 20 MG PO TABS: 20.0000 mg | ORAL_TABLET | Freq: Three times a day (TID) | ORAL | 0 refills | Status: DC | PRN

## 2019-05-23 MED ORDER — SUCRALFATE 1 GM/10ML PO SUSP: 1.0000 g | Freq: Four times a day (QID) | ORAL | 0 refills | Status: DC

## 2019-05-23 MED ORDER — HALOPERIDOL LACTATE 5 MG/ML IJ SOLN
1.0000 mg | Freq: Once | INTRAMUSCULAR | Status: AC
  Administered 2019-05-23: 1 mg via INTRAVENOUS
  Filled 2019-05-23: qty 1

## 2019-05-23 MED ORDER — PANTOPRAZOLE SODIUM 40 MG IV SOLR
40.0000 mg | Freq: Once | INTRAVENOUS | Status: AC
  Administered 2019-05-23: 40 mg via INTRAVENOUS
  Filled 2019-05-23: qty 40

## 2019-05-23 MED ORDER — SUCRALFATE 1 GM/10ML PO SUSP
1.0000 g | Freq: Four times a day (QID) | ORAL | Status: DC
  Administered 2019-05-23: 1 g via ORAL
  Filled 2019-05-23: qty 10

## 2019-05-23 MED ORDER — HYDROMORPHONE HCL 1 MG/ML IJ SOLN
1.0000 mg | Freq: Once | INTRAMUSCULAR | Status: AC
  Administered 2019-05-23: 1 mg via INTRAVENOUS
  Filled 2019-05-23: qty 1

## 2019-05-23 MED ORDER — SODIUM CHLORIDE 0.9 % IV SOLN
1000.0000 mL | Freq: Once | INTRAVENOUS | Status: AC
  Administered 2019-05-23: 1000 mL via INTRAVENOUS

## 2019-05-23 NOTE — Discharge Instructions (Addendum)
Please be sure to call Dr. Nona Dell office to schedule an appointment to have your abdominal pain further evaluated. Please continue to take the Omeprazole, Carafate, and you can try the Bentyl as well. You can take Tylenol for pain. I recommend taking this scheduled to stay on top of the pain. Please avoid ibuprofen/Aleve as these can cause more harm.   Take care.

## 2019-05-23 NOTE — ED Triage Notes (Signed)
Patient had a testicle blockage 3 weeks ago with surgery. Patient complaining of abdominal pain and emesis ongoing x 2 days.

## 2019-05-23 NOTE — ED Provider Notes (Signed)
[facility] EMERGENCY DEPARTMENT Provider Note   CSN: [id number] Arrival date & time: 05/23/19  5701     History Chief Complaint  Patient presents with  . Abdominal Pain    emesis    David Miranda is a 33 y.o. 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  Described as constant ache with occasional sharp pain. Notes nausea and vomiting. Unable to tolerate PO. Pain is radiating to back and shoulders.  Has doctor appointment on 12/29 but pain is so severe notes "cant make it till then". Normal stools. Last able to tolerate solids was noon yesterday. Notes vomiting improves the pain. Notes the pain seems to be getting worse since he was seen on 12/11. He was admitted at that time for pain control and IV fluids. At time of discharge he was tolerating PO without nausea, vomiting. He was discharged with Creon but was unable to afford the medicine. He notes the pain is not always this severe, but today it is very severe. He notes he has been taking his PPI and H2 blocker as prescribed.    Past Medical History:  Diagnosis Date  . GERD (gastroesophageal reflux disease)   . Pancreatitis     Patient Active Problem List   Diagnosis Date Noted  . Intractable abdominal pain 05/16/2019  . SBO (small bowel obstruction) ([place])   . Peritonitis ([place]) 04/13/2019  . Gastric outlet obstruction   . Abdominal pain   . GI bleed 03/31/2019  . Emesis, persistent 03/30/2019  . Esophagitis 03/30/2019  . Pancreatic pseudocyst/cyst--alcoholic pancreatitis 77/93/9030  . Acute necrotizing pancreatitis 07/24/2018  . Cannabis abuse 03/27/2018  . Acute kidney injury ([place])   . Hypomagnesemia   . Acute on chronic pancreatitis ([place]) 03/26/2018  . GERD (gastroesophageal reflux disease) 03/26/2018  . Acute  renal failure (ARF) ([place]) 03/26/2018  . Hypokalemia 02/27/2018  . Essential hypertension 02/27/2018  . Tobacco abuse 02/27/2018  . Acute pancreatitis 02/27/2018  . Constipation   . Annular pancreas   . Pancreatitis   . Non-intractable vomiting with nausea   . Hepatic steatosis   . 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 Leukocytosis 12/27/2017  . Abdominal pain, epigastric 12/27/2017  . Acute alcoholic pancreatitis 02/25/3006  . Alcohol abuse 12/26/2017    Past Surgical History:  Procedure Laterality Date  . ESOPHAGOGASTRODUODENOSCOPY (EGD) WITH PROPOFOL N/A 04/01/2019   Procedure: ESOPHAGOGASTRODUODENOSCOPY (EGD) WITH PROPOFOL;  Surgeon: Danie Binder, MD;  Location: [facility];  Service: Endoscopy;  Laterality: N/A;  . GASTROJEJUNOSTOMY N/A 04/04/2019   Procedure: GASTROJEJUNOSTOMY;  Surgeon: Virl Cagey, MD;  Location: [facility];  Service: General;  Laterality: N/A;  . GASTROJEJUNOSTOMY  04/14/2019   Procedure: REVISION GASTROJEJUNOSTOMY TO ROUX-EN-Y;  Surgeon: Virl Cagey, MD;  Location: [facility];  Service: General;;  . NO PAST SURGERIES    . TESTICLE SURGERY         Family History  Problem Relation Age of Onset  . Cancer Other     Social History   Tobacco Use  . Smoking status: Current Every Day Smoker    Packs/day: 1.00    Types: Cigarettes  . Smokeless tobacco: Never Used  Substance Use Topics  . Alcohol use: Yes    Comment: pint every few days  . Drug use: Not Currently    Types: Marijuana    Comment: occasionally  Home Medications Prior to Admission medications   Medication Sig Start Date End Date Taking? Authorizing Provider  acetaminophen (TYLENOL) 325 MG tablet Take 2 tablets (650 mg total) by mouth every 4 (four) hours as needed for mild pain, fever or headache. 04/08/19 04/07/20  Roxan Hockey, MD  dicyclomine (BENTYL) 20 MG tablet Take 1 tablet (20 mg total) by mouth 3 (three) times daily as needed for spasms. 05/23/19 05/22/20   Sunny Gains P, DO  HYDROcodone-acetaminophen (NORCO) 5-325 MG tablet Take 1 tablet by mouth every 4 (four) hours as needed for severe pain. 05/06/19   Virl Cagey, MD  lipase/protease/amylase (CREON) 36000 UNITS CPEP capsule Take 2 capsules (72,000 Units total) by mouth 3 (three) times daily with meals. 04/08/19   Roxan Hockey, MD  methocarbamol (ROBAXIN) 500 MG tablet Take 1 tablet (500 mg total) by mouth every 8 (eight) hours as needed for muscle spasms. 05/17/19   Barton Dubois, MD  Multiple Vitamin (MULTIVITAMIN WITH MINERALS) TABS tablet Take 1 tablet by mouth daily. 04/08/19   Roxan Hockey, MD  omeprazole (PRILOSEC) 40 MG capsule Take 1 capsule (40 mg total) by mouth 2 (two) times daily. 05/06/19   Virl Cagey, MD  senna-docusate (SENOKOT-S) 8.6-50 MG tablet Take 2 tablets by mouth at bedtime. Patient not taking: Reported on 04/13/2019 04/08/19 06/07/19  Roxan Hockey, MD  sucralfate (CARAFATE) 1 GM/10ML suspension Take 10 mLs (1 g total) by mouth 4 (four) times daily. 05/23/19 06/22/19  Kerrington Sova P, DO  traZODone (DESYREL) 100 MG tablet Take 1 tablet (100 mg total) by mouth at bedtime. 05/17/19   Barton Dubois, MD    Allergies    Patient has no known allergies.  Review of Systems   Review of Systems  Constitutional: Positive for appetite change. Negative for chills and fever.  HENT: Negative for sore throat.   Respiratory: Negative for cough and shortness of breath.   Cardiovascular: Negative for chest pain.  Gastrointestinal: Positive for abdominal pain, nausea and vomiting. Negative for blood in stool, constipation and diarrhea.  Musculoskeletal: Positive for back pain.    Physical Exam Updated Vital Signs BP 101/78   Pulse 65   Temp 97.9 F (36.6 C) (Oral)   Resp 16   Ht 6' (1.829 m)   Wt 68 kg   SpO2 100%   BMI 20.34 kg/m   Physical Exam Constitutional:      General: He is in acute distress.     Appearance: He is not ill-appearing or  toxic-appearing.  HENT:     Head: Normocephalic and atraumatic.  Cardiovascular:     Rate and Rhythm: Normal rate and regular rhythm.     Heart sounds: Normal heart sounds.  Pulmonary:     Effort: Pulmonary effort is normal.     Breath sounds: Normal breath sounds.  Abdominal:     General: Abdomen is flat. A surgical scar is present. Bowel sounds are normal. There is no distension.     Palpations: Abdomen is soft. There is no hepatomegaly or splenomegaly.     Tenderness: There is abdominal tenderness in the epigastric area and left upper quadrant. There is guarding.     Hernia: No hernia is present.  Skin:    General: Skin is warm and dry.     Capillary Refill: Capillary refill takes less than 2 seconds.  Neurological:     Mental Status: He is alert.     ED Results / Procedures / Treatments   Labs (all labs  ordered are listed, but only abnormal results are displayed) Labs Reviewed  COMPREHENSIVE METABOLIC PANEL - Abnormal; Notable for the following components:      Result Value   Glucose, Bld 120 (*)    All other components within normal limits  LIPASE, BLOOD - Abnormal; Notable for the following components:   Lipase 98 (*)    All other components within normal limits  CBC WITH DIFFERENTIAL/PLATELET - Abnormal; Notable for the following components:   WBC 12.5 (*)    Hemoglobin 12.1 (*)    MCV 78.2 (*)    MCH 24.2 (*)    RDW 23.7 (*)    Platelets 499 (*)    Neutro Abs 8.5 (*)    Abs Immature Granulocytes 0.09 (*)    All other components within normal limits  URINALYSIS, ROUTINE W REFLEX MICROSCOPIC - Abnormal; Notable for the following components:   Color, Urine STRAW (*)    All other components within normal limits  ETHANOL    EKG None  Radiology No results found.  Procedures Procedures (including critical care time)  Medications Ordered in ED Medications  sucralfate (CARAFATE) 1 GM/10ML suspension 1 g (1 g Oral Given 05/23/19 1026)  HYDROmorphone  (DILAUDID) injection 1 mg (1 mg Intravenous Given 05/23/19 0745)  ondansetron (ZOFRAN) injection 4 mg (4 mg Intravenous Given 05/23/19 0745)  0.9 %  sodium chloride infusion (0 mLs Intravenous Stopped 05/23/19 1121)  haloperidol lactate (HALDOL) injection 1 mg (1 mg Intravenous Given 05/23/19 0812)  dicyclomine (BENTYL) injection 20 mg (20 mg Intramuscular Given 05/23/19 0815)  sodium chloride 0.9 % bolus 1,000 mL (0 mLs Intravenous Stopped 05/23/19 1121)  pantoprazole (PROTONIX) injection 40 mg (40 mg Intravenous Given 05/23/19 1007)  HYDROcodone-acetaminophen (NORCO/VICODIN) 5-325 MG per tablet 2 tablet (2 tablets Oral Given 05/23/19 1120)    ED Course  I have reviewed the triage vital signs and the nursing notes.  Pertinent labs & imaging results that were available during my care of the patient were reviewed by me and considered in my medical decision making (see chart for details).  Patient is a 33 y.o. male with significant PMH for tobacco use, recurrent alcoholic pancreatitis, alcohol abuse, necrotizing pancreatitis, hepatic steatosis, GERD, GI bleed, peritonitis, SBO requiring surgical Roux en Y gastroenterostomy on 04/14/19 presenting today with recurrent and persistent epigastric/LUQ/periumbilical abdominal pain. Normoactive bowel sounds. Abdomen soft but diffusely tender to palpation. Otherwise  hemodynamically appropriate and stable on room air without fever normal saturations.  No respiratory distress.  Normal cardiac exam. Normal capillary refill.  Patient overall well-hydrated and well-appearing at time of my exam.  Labs notable for mild leukocytosis of 12.5 with ANC of 8.5. Hgb improved to 12.1 (up from 10.8 on 12/11). CMP WNL. Lipase more elevated from prior, 73>98.  IV fluids, IV 36m Dilaudid, 191mHaldol, and 2017mM Bentyl given for pain. Will reassess.  Reassesed patient. Pain improved.   Reviewed nursing notes and prior charts for additional history. Given recent CT  abd/pelvis just 7 days ago that was unremarkable for cause of severe pain, do not think that further imaging is warranted at this time. Particularly given improvement in pain. Discussed management plan with patient. He agreed to follow up with GI outpatient for further evaluation. He will also plan to follow up with his gerneral surgeon. Rx for Bentyl and Carafate provided. Patient instructed to continue his Omeprazole 44m4md as prescribed. He voiced understanding and agreed to this plan. Strict return precautions were discussed.   I have  considered the following causes of his severe abdominal pain: gastric ulcer, pancreatitis, obstruction, ileus, ischemia, and other serious bacterial illnesses. Most likely differential includes gastric ulcer. Patient's presentation and/or labs are not consistent with any of the remaining potential causes.   Return precautions discussed with family prior to discharge and they were advised to follow with pcp as needed if symptoms worsen or fail to improve.    MDM Rules/Calculators/A&P                      Reviewed nursing notes and prior charts for additional history.   Patient seen on 12/1 for similar pain. Labs were notable for leukocytosis of 15 without fever or signs of infection. Labs otherwise WNL including normal lipase, negative alcohol level, normal liver enzymes, alk phos, electrolytes and kidney function.  CT abdomen/pelvis without obstruction but notable for post-surgical changes including some thickening of the gastrojejunostomy anastomosis. Surgery on call felt no indication for admission. Follow up outpatient.  Followed up at Dr. Constance Haw on 12/1. Felt pain could be secondary to post-op swelling around GJ vs ulcer. He was instructed to take Omeprazole 53m BID and Carafate QID. He was given Norco for pain. Patient does endorse compliance with these medications.   Presents again on 12/11 with similar and persistent pain. Labs at that time notable for  mildly elevated lipase of 73, normal CMP, mildly decreased hemoglobin of 11.1 (down from 13.9 on 12/1), otherwise normal electrolytes, negative alcohol. CT abdomen/pelvis concerning for possible ileus vs partial obstruction. He was admitted for IV fluids and pain control. Discharged on 12/12 with improved pain, tolerating PO. Discharged with Creon and PRN Vicodin and Robaxin. He was unable to afford the Creon.   Final Clinical Impression(s) / ED Diagnoses Final diagnoses:  Epigastric pain    Rx / DC Orders ED Discharge Orders         Ordered    dicyclomine (BENTYL) 20 MG tablet  3 times daily PRN     05/23/19 1229    sucralfate (CARAFATE) 1 GM/10ML suspension  4 times daily     05/23/19 [id number] DO 05/23/19 1337    ZElnora Morrison MD 05/23/19 1539

## 2019-05-23 NOTE — ED Notes (Signed)
Given fluids.

## 2019-06-03 ENCOUNTER — Ambulatory Visit: Payer: Self-pay | Admitting: General Surgery

## 2019-07-16 ENCOUNTER — Emergency Department (HOSPITAL_COMMUNITY)
Admission: EM | Admit: 2019-07-16 | Discharge: 2019-07-16 | Disposition: A | Discharge status: Home / Self Care | Payer: Self-pay | Attending: Emergency Medicine | Admitting: Emergency Medicine

## 2019-07-16 ENCOUNTER — Encounter (HOSPITAL_COMMUNITY): Payer: Self-pay | Admitting: Emergency Medicine

## 2019-07-16 ENCOUNTER — Emergency Department (HOSPITAL_COMMUNITY): Payer: Self-pay

## 2019-07-16 ENCOUNTER — Other Ambulatory Visit: Payer: Self-pay

## 2019-07-16 DIAGNOSIS — Z79899 Other long term (current) drug therapy: Secondary | ICD-10-CM | POA: Insufficient documentation

## 2019-07-16 DIAGNOSIS — R1011 Right upper quadrant pain: Secondary | ICD-10-CM | POA: Insufficient documentation

## 2019-07-16 DIAGNOSIS — D473 Essential (hemorrhagic) thrombocythemia: Secondary | ICD-10-CM | POA: Insufficient documentation

## 2019-07-16 DIAGNOSIS — F1910 Other psychoactive substance abuse, uncomplicated: Secondary | ICD-10-CM | POA: Insufficient documentation

## 2019-07-16 DIAGNOSIS — I10 Essential (primary) hypertension: Secondary | ICD-10-CM | POA: Insufficient documentation

## 2019-07-16 DIAGNOSIS — D75839 Thrombocytosis, unspecified: Secondary | ICD-10-CM

## 2019-07-16 DIAGNOSIS — F17210 Nicotine dependence, cigarettes, uncomplicated: Secondary | ICD-10-CM | POA: Insufficient documentation

## 2019-07-16 DIAGNOSIS — Z934 Other artificial openings of gastrointestinal tract status: Secondary | ICD-10-CM | POA: Insufficient documentation

## 2019-07-16 LAB — CBC
HCT: 37.2 % — ABNORMAL LOW (ref 39.0–52.0)
Hemoglobin: 11 g/dL — ABNORMAL LOW (ref 13.0–17.0)
MCH: 21.9 pg — ABNORMAL LOW (ref 26.0–34.0)
MCHC: 29.6 g/dL — ABNORMAL LOW (ref 30.0–36.0)
MCV: 74.1 fL — ABNORMAL LOW (ref 80.0–100.0)
Platelets: 924 10*3/uL (ref 150–400)
RBC: 5.02 MIL/uL (ref 4.22–5.81)
RDW: 20.7 % — ABNORMAL HIGH (ref 11.5–15.5)
WBC: 18 10*3/uL — ABNORMAL HIGH (ref 4.0–10.5)
nRBC: 0 % (ref 0.0–0.2)

## 2019-07-16 LAB — COMPREHENSIVE METABOLIC PANEL
ALT: 15 U/L (ref 0–44)
AST: 18 U/L (ref 15–41)
Albumin: 4.2 g/dL (ref 3.5–5.0)
Alkaline Phosphatase: 99 U/L (ref 38–126)
Anion gap: 16 — ABNORMAL HIGH (ref 5–15)
BUN: 8 mg/dL (ref 6–20)
CO2: 25 mmol/L (ref 22–32)
Calcium: 10.2 mg/dL (ref 8.9–10.3)
Chloride: 100 mmol/L (ref 98–111)
Creatinine, Ser: 0.69 mg/dL (ref 0.61–1.24)
GFR calc Af Amer: >60 mL/min (ref >60)
GFR calc non Af Amer: >60 mL/min (ref >60)
Glucose, Bld: 152 mg/dL — ABNORMAL HIGH (ref 70–99)
Potassium: 4.2 mmol/L (ref 3.5–5.1)
Sodium: 141 mmol/L (ref 135–145)
Total Bilirubin: 0.3 mg/dL (ref 0.3–1.2)
Total Protein: 9.1 g/dL — ABNORMAL HIGH (ref 6.5–8.1)

## 2019-07-16 LAB — DIFFERENTIAL
Abs Immature Granulocytes: 0.14 10*3/uL — ABNORMAL HIGH (ref 0.00–0.07)
Basophils Absolute: 0.1 10*3/uL (ref 0.0–0.1)
Basophils Relative: 1 %
Eosinophils Absolute: 0.1 10*3/uL (ref 0.0–0.5)
Eosinophils Relative: 1 %
Immature Granulocytes: 1 %
Lymphocytes Relative: 21 %
Lymphs Abs: 3.6 10*3/uL (ref 0.7–4.0)
Monocytes Absolute: 1 10*3/uL (ref 0.1–1.0)
Monocytes Relative: 6 %
Neutro Abs: 12.5 10*3/uL — ABNORMAL HIGH (ref 1.7–7.7)
Neutrophils Relative %: 70 %

## 2019-07-16 LAB — URINALYSIS, ROUTINE W REFLEX MICROSCOPIC
Bilirubin Urine: NEGATIVE
Glucose, UA: NEGATIVE mg/dL
Hgb urine dipstick: NEGATIVE
Ketones, ur: NEGATIVE mg/dL
Leukocytes,Ua: NEGATIVE
Nitrite: NEGATIVE
Protein, ur: NEGATIVE mg/dL
Specific Gravity, Urine: 1.017 (ref 1.005–1.030)
pH: 8 (ref 5.0–8.0)

## 2019-07-16 LAB — RAPID URINE DRUG SCREEN, HOSP PERFORMED
Amphetamines: POSITIVE — AB
Barbiturates: NOT DETECTED
Benzodiazepines: NOT DETECTED
Cocaine: POSITIVE — AB
Opiates: NOT DETECTED
Tetrahydrocannabinol: POSITIVE — AB

## 2019-07-16 LAB — LIPASE, BLOOD: Lipase: 41 U/L (ref 11–51)

## 2019-07-16 MED ORDER — SODIUM CHLORIDE 0.9% FLUSH: 3.0000 mL | Freq: Once | INTRAVENOUS | Status: DC

## 2019-07-16 MED ORDER — SODIUM CHLORIDE 0.9 % IV BOLUS
1000.0000 mL | Freq: Once | INTRAVENOUS | Status: AC
  Administered 2019-07-16: 19:00:00 1000 mL via INTRAVENOUS

## 2019-07-16 MED ORDER — IOHEXOL 300 MG/ML  SOLN
100.0000 mL | Freq: Once | INTRAMUSCULAR | Status: AC | PRN
  Administered 2019-07-16: 20:00:00 100 mL via INTRAVENOUS

## 2019-07-16 MED ORDER — ONDANSETRON HCL 4 MG/2ML IJ SOLN
4.0000 mg | Freq: Once | INTRAMUSCULAR | Status: AC
  Administered 2019-07-16: 20:00:00 4 mg via INTRAVENOUS
  Filled 2019-07-16: qty 2

## 2019-07-16 MED ORDER — FENTANYL CITRATE (PF) 100 MCG/2ML IJ SOLN
50.0000 ug | Freq: Once | INTRAMUSCULAR | Status: AC
  Administered 2019-07-16: 50 ug via INTRAVENOUS
  Filled 2019-07-16: qty 2

## 2019-07-16 MED ORDER — HYDROMORPHONE HCL 1 MG/ML IJ SOLN
1.0000 mg | Freq: Once | INTRAMUSCULAR | Status: AC
  Administered 2019-07-16: 1 mg via INTRAVENOUS
  Filled 2019-07-16: qty 1

## 2019-07-16 NOTE — ED Triage Notes (Signed)
Patient reports right sided abdominal pain that started 2 days ago. Patient reports nausea from pain.

## 2019-07-16 NOTE — ED Notes (Signed)
In room to discharge pt and to go over discharge paperwork. Pt cussing and stating he was going to another hospital because he wasn't given anything for pain. Pt also refused to sign his discharge paperwork or to take his discharge instructions. Security in room to escort pt out.

## 2019-07-16 NOTE — ED Notes (Signed)
Date and time results received: 07/16/19 1930 (use smartphrase ".now" to insert current time)  Test: platelets Critical Value: 924  Name of Provider Notified: Percell Miller, [place]  Orders Received? Or Actions Taken?: Actions Taken: no orders received

## 2019-07-16 NOTE — ED Notes (Signed)
NT to room to obtain urine sample. NT stated that pt was rummaging around in garbage can and putting stuff in his mouth, staff unsure of what he was putting in his mouth; stating he was still in pain.  EDP notified of patient's behavior.

## 2019-07-16 NOTE — ED Provider Notes (Signed)
[facility] EMERGENCY DEPARTMENT Provider Note   CSN: [id number] Arrival date & time: 07/16/19  1818     History Chief Complaint  Patient presents with  . Abdominal Pain    David Miranda is a 34 y.o. male.  34 year old male with past medical history of acute alcoholic pancreatitis, SBO, gastric outlet obstruction with prior abdominal surgery including gastrojejunostomy in December 2020 presents with complaint of right upper quadrant pain.  Patient states that he has had pain in his stomach since his surgery in December however today he is having severe right upper quadrant pain that is unlike any other pain he has had before.  Patient reports he is constipated, last bowel movement was yesterday, denies nausea or vomiting, fevers or chills.  Pain is constant, sharp in nature, worse with movement, is holding his right hip flexed and states it hurts too much in his stomach to straighten his leg out.  No other complaints or concerns today.        Past Medical History:  Diagnosis Date  . GERD (gastroesophageal reflux disease)   . Pancreatitis     Patient Active Problem List   Diagnosis Date Noted  . Intractable abdominal pain 05/16/2019  . SBO (small bowel obstruction) ([place])   . Peritonitis ([place]) 04/13/2019  . Gastric outlet obstruction   . Abdominal pain   . GI bleed 03/31/2019  . Emesis, persistent 03/30/2019  . Esophagitis 03/30/2019  . Pancreatic pseudocyst/cyst--alcoholic pancreatitis [id number]  . Acute necrotizing pancreatitis 07/24/2018  . Cannabis abuse 03/27/2018  . Acute kidney injury ([place])   . Hypomagnesemia   . Acute on chronic pancreatitis ([place]) 03/26/2018  . GERD (gastroesophageal reflux disease) 03/26/2018  . Acute renal failure (ARF) ([place]) 03/26/2018  . Hypokalemia 02/27/2018  . Essential hypertension 02/27/2018  . Tobacco abuse 02/27/2018  . Acute pancreatitis 02/27/2018  . Constipation   . Annular pancreas   . Pancreatitis   . Non-intractable  vomiting with nausea   . Hepatic steatosis   . 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 Leukocytosis 12/27/2017  . Abdominal pain, epigastric 12/27/2017  . Acute alcoholic pancreatitis 0000000  . Alcohol abuse 12/26/2017    Past Surgical History:  Procedure Laterality Date  . ESOPHAGOGASTRODUODENOSCOPY (EGD) WITH PROPOFOL N/A 04/01/2019   Procedure: ESOPHAGOGASTRODUODENOSCOPY (EGD) WITH PROPOFOL;  Surgeon: Danie Binder, MD;  Location: [facility] ENDO SUITE;  Service: Endoscopy;  Laterality: N/A;  . GASTROJEJUNOSTOMY N/A 04/04/2019   Procedure: GASTROJEJUNOSTOMY;  Surgeon: Virl Cagey, MD;  Location: [facility] ORS;  Service: General;  Laterality: N/A;  . GASTROJEJUNOSTOMY  04/14/2019   Procedure: REVISION GASTROJEJUNOSTOMY TO ROUX-EN-Y;  Surgeon: Virl Cagey, MD;  Location: [facility] ORS;  Service: General;;  . NO PAST SURGERIES    . TESTICLE SURGERY         Family History  Problem Relation Age of Onset  . Cancer Other     Social History   Tobacco Use  . Smoking status: Current Every Day Smoker    Packs/day: 1.00    Types: Cigarettes  . Smokeless tobacco: Never Used  Substance Use Topics  . Alcohol use: Not Currently    Comment: pint every few days  . Drug use: Not Currently    Types: Marijuana    Comment: occasionally    Home Medications Prior to Admission medications   Medication Sig Start Date End Date Taking? Authorizing Provider  acetaminophen (TYLENOL) 325 MG tablet Take 2 tablets (650 mg total) by mouth every 4 (four) hours as needed for mild  pain, fever or headache. 04/08/19 04/07/20 Yes Emokpae, Courage, MD  dicyclomine (BENTYL) 20 MG tablet Take 1 tablet (20 mg total) by mouth 3 (three) times daily as needed for spasms. Patient not taking: Reported on 07/16/2019 05/23/19 05/22/20  Mina Marble P, DO  HYDROcodone-acetaminophen (NORCO) 5-325 MG tablet Take 1 tablet by mouth every 4 (four) hours as needed for severe pain. Patient not taking: Reported on 07/16/2019  05/06/19   Virl Cagey, MD  lipase/protease/amylase (CREON) 36000 UNITS CPEP capsule Take 2 capsules (72,000 Units total) by mouth 3 (three) times daily with meals. Patient not taking: Reported on 07/16/2019 04/08/19   Roxan Hockey, MD  methocarbamol (ROBAXIN) 500 MG tablet Take 1 tablet (500 mg total) by mouth every 8 (eight) hours as needed for muscle spasms. Patient not taking: Reported on 07/16/2019 05/17/19   Barton Dubois, MD  Multiple Vitamin (MULTIVITAMIN WITH MINERALS) TABS tablet Take 1 tablet by mouth daily. Patient not taking: Reported on 07/16/2019 04/08/19   Roxan Hockey, MD  omeprazole (PRILOSEC) 40 MG capsule Take 1 capsule (40 mg total) by mouth 2 (two) times daily. Patient not taking: Reported on 07/16/2019 05/06/19   Virl Cagey, MD  sucralfate (CARAFATE) 1 GM/10ML suspension Take 10 mLs (1 g total) by mouth 4 (four) times daily. Patient not taking: Reported on 07/16/2019 05/23/19 06/22/19  Mina Marble P, DO  traZODone (DESYREL) 100 MG tablet Take 1 tablet (100 mg total) by mouth at bedtime. Patient not taking: Reported on 07/16/2019 05/17/19   Barton Dubois, MD    Allergies    Patient has no known allergies.  Review of Systems   Review of Systems  Constitutional: Negative for chills, diaphoresis and fever.  Respiratory: Negative for shortness of breath.   Cardiovascular: Negative for chest pain.  Gastrointestinal: Positive for abdominal pain and constipation. Negative for diarrhea, nausea and vomiting.  Genitourinary: Negative for difficulty urinating and dysuria.  Musculoskeletal: Negative for arthralgias and myalgias.  Skin: Negative for rash and wound.  Allergic/Immunologic: Negative for immunocompromised state.  Neurological: Negative for weakness.  Hematological: Negative for adenopathy.  Psychiatric/Behavioral: Negative for confusion.  All other systems reviewed and are negative.   Physical Exam Updated Vital Signs BP 140/86   Pulse 64    Temp 97.9 F (36.6 C) (Oral)   Resp 20   Ht 6' (1.829 m)   Wt 70.3 kg   SpO2 100%   BMI 21.02 kg/m   Physical Exam Vitals and nursing note reviewed.  Constitutional:      General: He is not in acute distress.    Appearance: He is well-developed. He is not diaphoretic.  HENT:     Head: Normocephalic and atraumatic.  Cardiovascular:     Rate and Rhythm: Normal rate and regular rhythm.  Pulmonary:     Effort: Pulmonary effort is normal.     Breath sounds: Normal breath sounds.  Abdominal:     General: A surgical scar is present.     Tenderness: There is abdominal tenderness. There is guarding.  Skin:    General: Skin is warm and dry.     Findings: No erythema or rash.  Neurological:     Mental Status: He is alert and oriented to person, place, and time.  Psychiatric:        Behavior: Behavior normal.     ED Results / Procedures / Treatments   Labs (all labs ordered are listed, but only abnormal results are displayed) Labs Reviewed  COMPREHENSIVE METABOLIC PANEL -  Abnormal; Notable for the following components:      Result Value   Glucose, Bld 152 (*)    Total Protein 9.1 (*)    Anion gap 16 (*)    All other components within normal limits  CBC - Abnormal; Notable for the following components:   WBC 18.0 (*)    Hemoglobin 11.0 (*)    HCT 37.2 (*)    MCV 74.1 (*)    MCH 21.9 (*)    MCHC 29.6 (*)    RDW 20.7 (*)    Platelets 924 (*)    All other components within normal limits  RAPID URINE DRUG SCREEN, HOSP PERFORMED - Abnormal; Notable for the following components:   Cocaine POSITIVE (*)    Amphetamines POSITIVE (*)    Tetrahydrocannabinol POSITIVE (*)    All other components within normal limits  LIPASE, BLOOD  URINALYSIS, ROUTINE W REFLEX MICROSCOPIC  PATHOLOGIST SMEAR REVIEW  DIFFERENTIAL    EKG None  Radiology CT Abdomen Pelvis W Contrast  Result Date: 07/16/2019 CLINICAL DATA:  Right-sided abdominal pain for 2 days, nausea EXAM: CT ABDOMEN AND  PELVIS WITH CONTRAST TECHNIQUE: Multidetector CT imaging of the abdomen and pelvis was performed using the standard protocol following bolus administration of intravenous contrast. CONTRAST:  147mL OMNIPAQUE IOHEXOL 300 MG/ML  SOLN COMPARISON:  05/16/2019 FINDINGS: Lower chest: No acute pleural or parenchymal lung disease. Hepatobiliary: No focal liver abnormality is seen. No gallstones, gallbladder wall thickening, or biliary dilatation. Pancreas: 1.8 x 1.5 cm cystic structure within the pancreatic body reference image 27, not significantly changed since prior exam. Remainder of the pancreas is unremarkable. Spleen: Normal in size without focal abnormality. Adrenals/Urinary Tract: Stable left renal cortical cyst. Otherwise the kidneys enhance normally. The adrenals are unremarkable. No urinary tract calculi. The bladder is unremarkable. Stomach/Bowel: Gastrojejunostomy again identified. There is marked wall thickening involving the jejunal limb at the gastrojejunostomy site, with significant upper abdominal fat stranding. No evidence of bowel obstruction or ileus. Normal appendix right lower quadrant. Moderate fecal retention. Vascular/Lymphatic: Small reactive lymph nodes are seen surrounding the inflamed jejunum at the gastrojejunostomy. No other pathologic adenopathy. Vascular structures are grossly normal. Reproductive: Prostate is unremarkable. Other: No abdominal wall hernia or abnormality. No abdominopelvic ascites. Musculoskeletal: No acute or destructive bony lesions. Reconstructed images demonstrate no additional findings. IMPRESSION: 1. Progressive inflammatory changes involving the jejunum at the gastrojejunostomy site central upper abdomen. This may be infectious or inflammatory. 2. Moderate fecal retention.  No bowel obstruction. 3. Stable pancreatic cyst. Electronically Signed   By: Randa Ngo M.D.   On: 07/16/2019 20:40    Procedures Procedures (including critical care time)  Medications  Ordered in ED Medications  sodium chloride flush (NS) 0.9 % injection 3 mL (has no administration in time range)  sodium chloride 0.9 % bolus 1,000 mL (0 mLs Intravenous Stopped 07/16/19 2123)  HYDROmorphone (DILAUDID) injection 1 mg (1 mg Intravenous Given 07/16/19 1930)  ondansetron (ZOFRAN) injection 4 mg (4 mg Intravenous Given 07/16/19 1930)  iohexol (OMNIPAQUE) 300 MG/ML solution 100 mL (100 mLs Intravenous Contrast Given 07/16/19 2014)  fentaNYL (SUBLIMAZE) injection 50 mcg (50 mcg Intravenous Given 07/16/19 2001)    ED Course  I have reviewed the triage vital signs and the nursing notes.  Pertinent labs & imaging results that were available during my care of the patient were reviewed by me and considered in my medical decision making (see chart for details).  Clinical Course as of Jul 15 2144  Wed  Jul 16, 2019  2113 Elevated platelets tonight, review of prior results, patient has had platelet counts in the 600-800 range.    [LM]  6023 34 year old male with history listed above presents with severe abdominal pain.  On initial presentation patient is screaming, holding his right hip flexed, guarding his abdomen and will not relax for exam, states that he cannot move his leg secondary to pain in his abdomen. He has not had any associated symptoms including nausea, vomiting, changes in bowel or bladder habits or fevers. Patient was given IV Dilaudid with fluids while awaiting CT scan as he states that this is different from his previous severe pain episodes.  Patient refuses to go to CT, states that he needs more pain medication before he can have a CT scan done and was given IV fentanyl. CT scan is unchanged from his multiple prior CT scans.  Patient's vital signs have improved with pain medication and IV fluids.  Urinalysis is normal, CMP without significant findings, CBC with leukocytosis with white count of 18,000, hemoglobin of 11, platelets of 924.  See prior note regarding patient's  elevated platelet count. Patient was found by nursing staff taking through the trash and putting things in his mouth.  A urine drug screen was added and is positive for cocaine, amphetamines, marijuana. On recheck, patient is resting comfortably in the bed, arouses to verbal stimuli and hands complaining of sharp severe abdominal pain again.  Case discussed with Dr. Lacinda Axon, ER attending who has seen patient.  Review of prior medical records, patient has been seen multiple times previously with same symptoms, question malingering as patient has had several similar presentations without source of pain found.  Recommend patient follow-up with his surgeon.   [LM]    Clinical Course User Index [LM] Roque Lias   MDM Rules/Calculators/A&P                      Final Clinical Impression(s) / ED Diagnoses Final diagnoses:  Thrombocytosis ([place])  Right upper quadrant abdominal pain  Polysubstance abuse [facility])    Rx / DC Orders ED Discharge Orders    None       Roque Lias 07/16/19 2146    Nat Christen, MD 07/17/19 [phone]

## 2019-07-16 NOTE — ED Notes (Signed)
CT attempted to get pt for a scan. Pt refused saying he wants to talk to provider first for pain meds.  Provider notified

## 2019-07-16 NOTE — Discharge Instructions (Addendum)
Follow-up with your surgeon's office. Recommend stool softener to help with constipation seen on CT scan.

## 2019-07-18 LAB — PATHOLOGIST SMEAR REVIEW
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
## 2019-08-29 ENCOUNTER — Encounter (HOSPITAL_COMMUNITY): Payer: Self-pay

## 2019-08-29 ENCOUNTER — Emergency Department (HOSPITAL_COMMUNITY): Payer: Self-pay

## 2019-08-29 ENCOUNTER — Inpatient Hospital Stay (HOSPITAL_COMMUNITY)
Admission: EM | Admit: 2019-08-29 | Discharge: 2019-09-02 | DRG: 380 | Disposition: A | Discharge status: Home / Self Care | Payer: Self-pay | Attending: Family Medicine | Admitting: Family Medicine

## 2019-08-29 ENCOUNTER — Other Ambulatory Visit: Payer: Self-pay

## 2019-08-29 DIAGNOSIS — F17210 Nicotine dependence, cigarettes, uncomplicated: Secondary | ICD-10-CM | POA: Diagnosis present

## 2019-08-29 DIAGNOSIS — G8929 Other chronic pain: Secondary | ICD-10-CM | POA: Diagnosis present

## 2019-08-29 DIAGNOSIS — K922 Gastrointestinal hemorrhage, unspecified: Secondary | ICD-10-CM | POA: Diagnosis present

## 2019-08-29 DIAGNOSIS — F1010 Alcohol abuse, uncomplicated: Secondary | ICD-10-CM | POA: Diagnosis present

## 2019-08-29 DIAGNOSIS — F1210 Cannabis abuse, uncomplicated: Secondary | ICD-10-CM | POA: Diagnosis present

## 2019-08-29 DIAGNOSIS — R001 Bradycardia, unspecified: Secondary | ICD-10-CM | POA: Diagnosis present

## 2019-08-29 DIAGNOSIS — I10 Essential (primary) hypertension: Secondary | ICD-10-CM | POA: Diagnosis present

## 2019-08-29 DIAGNOSIS — K2901 Acute gastritis with bleeding: Secondary | ICD-10-CM

## 2019-08-29 DIAGNOSIS — D75839 Thrombocytosis, unspecified: Secondary | ICD-10-CM

## 2019-08-29 DIAGNOSIS — K279 Peptic ulcer, site unspecified, unspecified as acute or chronic, without hemorrhage or perforation: Secondary | ICD-10-CM | POA: Diagnosis present

## 2019-08-29 DIAGNOSIS — R109 Unspecified abdominal pain: Secondary | ICD-10-CM

## 2019-08-29 DIAGNOSIS — F1410 Cocaine abuse, uncomplicated: Secondary | ICD-10-CM | POA: Diagnosis present

## 2019-08-29 DIAGNOSIS — D473 Essential (hemorrhagic) thrombocythemia: Secondary | ICD-10-CM | POA: Diagnosis present

## 2019-08-29 DIAGNOSIS — Z720 Tobacco use: Secondary | ICD-10-CM | POA: Diagnosis present

## 2019-08-29 DIAGNOSIS — Z79899 Other long term (current) drug therapy: Secondary | ICD-10-CM

## 2019-08-29 DIAGNOSIS — K863 Pseudocyst of pancreas: Secondary | ICD-10-CM | POA: Diagnosis present

## 2019-08-29 DIAGNOSIS — K2211 Ulcer of esophagus with bleeding: Principal | ICD-10-CM | POA: Diagnosis present

## 2019-08-29 DIAGNOSIS — Z9119 Patient's noncompliance with other medical treatment and regimen: Secondary | ICD-10-CM

## 2019-08-29 DIAGNOSIS — K5900 Constipation, unspecified: Secondary | ICD-10-CM | POA: Diagnosis present

## 2019-08-29 DIAGNOSIS — F1910 Other psychoactive substance abuse, uncomplicated: Secondary | ICD-10-CM

## 2019-08-29 DIAGNOSIS — M62838 Other muscle spasm: Secondary | ICD-10-CM | POA: Diagnosis present

## 2019-08-29 DIAGNOSIS — Z20822 Contact with and (suspected) exposure to covid-19: Secondary | ICD-10-CM | POA: Diagnosis present

## 2019-08-29 DIAGNOSIS — K2100 Gastro-esophageal reflux disease with esophagitis, without bleeding: Secondary | ICD-10-CM | POA: Diagnosis present

## 2019-08-29 DIAGNOSIS — Z9884 Bariatric surgery status: Secondary | ICD-10-CM

## 2019-08-29 DIAGNOSIS — D509 Iron deficiency anemia, unspecified: Secondary | ICD-10-CM

## 2019-08-29 DIAGNOSIS — F1990 Other psychoactive substance use, unspecified, uncomplicated: Secondary | ICD-10-CM

## 2019-08-29 DIAGNOSIS — T39395A Adverse effect of other nonsteroidal anti-inflammatory drugs [NSAID], initial encounter: Secondary | ICD-10-CM | POA: Diagnosis present

## 2019-08-29 DIAGNOSIS — Z980 Intestinal bypass and anastomosis status: Secondary | ICD-10-CM

## 2019-08-29 DIAGNOSIS — R140 Abdominal distension (gaseous): Secondary | ICD-10-CM | POA: Diagnosis present

## 2019-08-29 DIAGNOSIS — K219 Gastro-esophageal reflux disease without esophagitis: Secondary | ICD-10-CM | POA: Diagnosis present

## 2019-08-29 DIAGNOSIS — K8591 Acute pancreatitis with uninfected necrosis, unspecified: Secondary | ICD-10-CM | POA: Diagnosis present

## 2019-08-29 DIAGNOSIS — D62 Acute posthemorrhagic anemia: Secondary | ICD-10-CM | POA: Diagnosis present

## 2019-08-29 DIAGNOSIS — K2920 Alcoholic gastritis without bleeding: Secondary | ICD-10-CM | POA: Diagnosis present

## 2019-08-29 DIAGNOSIS — K860 Alcohol-induced chronic pancreatitis: Secondary | ICD-10-CM | POA: Diagnosis present

## 2019-08-29 LAB — CBC
HCT: 39.4 % (ref 39.0–52.0)
HCT: 23.7 % — ABNORMAL LOW (ref 39.0–52.0)
Hemoglobin: 12.9 g/dL — ABNORMAL LOW (ref 13.0–17.0)
Hemoglobin: 7 g/dL — ABNORMAL LOW (ref 13.0–17.0)
MCH: 29.7 pg (ref 26.0–34.0)
MCH: 21.5 pg — ABNORMAL LOW (ref 26.0–34.0)
MCHC: 32.7 g/dL (ref 30.0–36.0)
MCHC: 29.5 g/dL — ABNORMAL LOW (ref 30.0–36.0)
MCV: 90.8 fL (ref 80.0–100.0)
MCV: 72.7 fL — ABNORMAL LOW (ref 80.0–100.0)
Platelets: 131 10*3/uL — ABNORMAL LOW (ref 150–400)
Platelets: 600 10*3/uL — ABNORMAL HIGH (ref 150–400)
RBC: 4.34 MIL/uL (ref 4.22–5.81)
RBC: 3.26 MIL/uL — ABNORMAL LOW (ref 4.22–5.81)
RDW: 12.2 % (ref 11.5–15.5)
RDW: 23.1 % — ABNORMAL HIGH (ref 11.5–15.5)
WBC: 4.3 10*3/uL (ref 4.0–10.5)
WBC: 9.1 10*3/uL (ref 4.0–10.5)
nRBC: 0 % (ref 0.0–0.2)
nRBC: 0 % (ref 0.0–0.2)

## 2019-08-29 LAB — CBC WITH DIFFERENTIAL/PLATELET
Abs Immature Granulocytes: 0.09 10*3/uL — ABNORMAL HIGH (ref 0.00–0.07)
Basophils Absolute: 0.1 10*3/uL (ref 0.0–0.1)
Basophils Relative: 1 %
Eosinophils Absolute: 0.3 10*3/uL (ref 0.0–0.5)
Eosinophils Relative: 2 %
HCT: 20.3 % — ABNORMAL LOW (ref 39.0–52.0)
Hemoglobin: 5.8 g/dL — CL (ref 13.0–17.0)
Immature Granulocytes: 1 %
Lymphocytes Relative: 21 %
Lymphs Abs: 2.8 10*3/uL (ref 0.7–4.0)
MCH: 18.8 pg — ABNORMAL LOW (ref 26.0–34.0)
MCHC: 28.6 g/dL — ABNORMAL LOW (ref 30.0–36.0)
MCV: 65.9 fL — ABNORMAL LOW (ref 80.0–100.0)
Monocytes Absolute: 1 10*3/uL (ref 0.1–1.0)
Monocytes Relative: 8 %
Neutro Abs: 9.3 10*3/uL — ABNORMAL HIGH (ref 1.7–7.7)
Neutrophils Relative %: 67 %
Platelets: 805 10*3/uL — ABNORMAL HIGH (ref 150–400)
RBC: 3.08 MIL/uL — ABNORMAL LOW (ref 4.22–5.81)
RDW: 20.5 % — ABNORMAL HIGH (ref 11.5–15.5)
WBC: 13.6 10*3/uL — ABNORMAL HIGH (ref 4.0–10.5)
nRBC: 0.1 % (ref 0.0–0.2)

## 2019-08-29 LAB — COMPREHENSIVE METABOLIC PANEL WITH GFR
ALT: 10 U/L (ref 0–44)
AST: 12 U/L — ABNORMAL LOW (ref 15–41)
Albumin: 3.7 g/dL (ref 3.5–5.0)
Alkaline Phosphatase: 62 U/L (ref 38–126)
Anion gap: 10 (ref 5–15)
BUN: 19 mg/dL (ref 6–20)
CO2: 26 mmol/L (ref 22–32)
Calcium: 9.3 mg/dL (ref 8.9–10.3)
Chloride: 103 mmol/L (ref 98–111)
Creatinine, Ser: 0.83 mg/dL (ref 0.61–1.24)
GFR calc Af Amer: >60 mL/min
GFR calc non Af Amer: >60 mL/min
Glucose, Bld: 127 mg/dL — ABNORMAL HIGH (ref 70–99)
Potassium: 3.8 mmol/L (ref 3.5–5.1)
Sodium: 139 mmol/L (ref 135–145)
Total Bilirubin: 0.3 mg/dL (ref 0.3–1.2)
Total Protein: 7.4 g/dL (ref 6.5–8.1)

## 2019-08-29 LAB — URINALYSIS, ROUTINE W REFLEX MICROSCOPIC
Bilirubin Urine: NEGATIVE
Glucose, UA: NEGATIVE mg/dL
Hgb urine dipstick: NEGATIVE
Ketones, ur: NEGATIVE mg/dL
Leukocytes,Ua: NEGATIVE
Nitrite: NEGATIVE
Protein, ur: NEGATIVE mg/dL
Specific Gravity, Urine: 1.017 (ref 1.005–1.030)
pH: 7 (ref 5.0–8.0)

## 2019-08-29 LAB — GLUCOSE, CAPILLARY: Glucose-Capillary: 97 mg/dL (ref 70–99)

## 2019-08-29 LAB — RAPID URINE DRUG SCREEN, HOSP PERFORMED
Amphetamines: POSITIVE — AB
Barbiturates: NOT DETECTED
Benzodiazepines: NOT DETECTED
Cocaine: POSITIVE — AB
Opiates: POSITIVE — AB
Tetrahydrocannabinol: NOT DETECTED

## 2019-08-29 LAB — HEMOGLOBIN AND HEMATOCRIT, BLOOD
HCT: 25.2 % — ABNORMAL LOW (ref 39.0–52.0)
Hemoglobin: 7.2 g/dL — ABNORMAL LOW (ref 13.0–17.0)

## 2019-08-29 LAB — RESPIRATORY PANEL BY RT PCR (FLU A&B, COVID)
Influenza A by PCR: NEGATIVE
Influenza B by PCR: NEGATIVE
SARS Coronavirus 2 by RT PCR: NEGATIVE

## 2019-08-29 LAB — PREPARE RBC (CROSSMATCH)

## 2019-08-29 LAB — MAGNESIUM: Magnesium: 2.1 mg/dL (ref 1.7–2.4)

## 2019-08-29 LAB — LIPASE, BLOOD: Lipase: 27 U/L (ref 11–51)

## 2019-08-29 LAB — ETHANOL: Alcohol, Ethyl (B): <10 mg/dL (ref <10)

## 2019-08-29 LAB — POC OCCULT BLOOD, ED: Fecal Occult Bld: NEGATIVE

## 2019-08-29 MED ORDER — HYDROMORPHONE HCL 1 MG/ML IJ SOLN: 0.5000 mg | INTRAMUSCULAR | Status: DC | PRN

## 2019-08-29 MED ORDER — HYDROMORPHONE HCL 1 MG/ML IJ SOLN
1.0000 mg | INTRAMUSCULAR | Status: DC | PRN
  Administered 2019-08-29 – 2019-08-30 (×6): 1 mg via INTRAVENOUS
  Filled 2019-08-29 (×6): qty 1

## 2019-08-29 MED ORDER — THIAMINE HCL 100 MG PO TABS
100.0000 mg | ORAL_TABLET | Freq: Every day | ORAL | Status: DC
  Administered 2019-08-30 – 2019-08-31 (×2): 100 mg via ORAL
  Filled 2019-08-29 (×5): qty 1

## 2019-08-29 MED ORDER — LIDOCAINE VISCOUS HCL 2 % MT SOLN
15.0000 mL | Freq: Once | OROMUCOSAL | Status: AC
  Administered 2019-08-29: 15 mL via ORAL
  Filled 2019-08-29: qty 15

## 2019-08-29 MED ORDER — HYDROXYZINE HCL 25 MG PO TABS
25.0000 mg | ORAL_TABLET | Freq: Three times a day (TID) | ORAL | Status: DC
  Administered 2019-08-29 – 2019-09-02 (×12): 25 mg via ORAL
  Filled 2019-08-29 (×13): qty 1

## 2019-08-29 MED ORDER — BISACODYL 10 MG RE SUPP
10.0000 mg | Freq: Every day | RECTAL | Status: AC
  Filled 2019-08-29: qty 1

## 2019-08-29 MED ORDER — SODIUM CHLORIDE 0.9 % IV SOLN
8.0000 mg/h | INTRAVENOUS | Status: DC
  Filled 2019-08-29 (×9): qty 80

## 2019-08-29 MED ORDER — LACTATED RINGERS IV SOLN: INTRAVENOUS | Status: DC

## 2019-08-29 MED ORDER — PHENOBARBITAL 32.4 MG PO TABS
32.4000 mg | ORAL_TABLET | Freq: Three times a day (TID) | ORAL | Status: AC
  Administered 2019-08-30 (×2): 32.4 mg via ORAL
  Filled 2019-08-29 (×3): qty 1

## 2019-08-29 MED ORDER — SODIUM CHLORIDE 0.9 % IV SOLN
10.0000 mL/h | Freq: Once | INTRAVENOUS | Status: AC
  Administered 2019-08-29: 10 mL/h via INTRAVENOUS

## 2019-08-29 MED ORDER — PHENOBARBITAL SODIUM 130 MG/ML IJ SOLN
130.0000 mg | Freq: Once | INTRAMUSCULAR | Status: AC
  Administered 2019-08-29: 130 mg via INTRAMUSCULAR
  Filled 2019-08-29: qty 1

## 2019-08-29 MED ORDER — ADULT MULTIVITAMIN W/MINERALS CH: 1.0000 | ORAL_TABLET | Freq: Every day | ORAL | Status: DC

## 2019-08-29 MED ORDER — LINACLOTIDE 145 MCG PO CAPS
145.0000 ug | ORAL_CAPSULE | Freq: Every day | ORAL | Status: DC
  Administered 2019-08-30 – 2019-09-02 (×4): 145 ug via ORAL
  Filled 2019-08-29 (×4): qty 1

## 2019-08-29 MED ORDER — SODIUM CHLORIDE 0.9 % IV BOLUS
1000.0000 mL | Freq: Once | INTRAVENOUS | Status: AC
  Administered 2019-08-29: 1000 mL via INTRAVENOUS

## 2019-08-29 MED ORDER — FOLIC ACID 1 MG PO TABS
1.0000 mg | ORAL_TABLET | Freq: Every day | ORAL | Status: DC
  Administered 2019-08-29 – 2019-09-02 (×5): 1 mg via ORAL
  Filled 2019-08-29 (×6): qty 1

## 2019-08-29 MED ORDER — ONDANSETRON HCL 4 MG/2ML IJ SOLN
4.0000 mg | Freq: Once | INTRAMUSCULAR | Status: AC
  Administered 2019-08-29: 4 mg via INTRAVENOUS
  Filled 2019-08-29: qty 2

## 2019-08-29 MED ORDER — SODIUM CHLORIDE 0.9% FLUSH: 3.0000 mL | INTRAVENOUS | Status: DC | PRN

## 2019-08-29 MED ORDER — HYDROMORPHONE HCL 1 MG/ML IJ SOLN: 2.0000 mg | INTRAMUSCULAR | Status: DC | PRN

## 2019-08-29 MED ORDER — PHENOBARBITAL 32.4 MG PO TABS
32.4000 mg | ORAL_TABLET | Freq: Two times a day (BID) | ORAL | Status: AC
  Administered 2019-08-31 (×2): 32.4 mg via ORAL
  Filled 2019-08-29 (×2): qty 1

## 2019-08-29 MED ORDER — PANTOPRAZOLE SODIUM 40 MG IV SOLR
40.0000 mg | Freq: Two times a day (BID) | INTRAVENOUS | Status: DC
  Administered 2019-08-29 – 2019-09-02 (×9): 40 mg via INTRAVENOUS
  Filled 2019-08-29 (×9): qty 40

## 2019-08-29 MED ORDER — SODIUM CHLORIDE 0.9 % IV SOLN
80.0000 mg | Freq: Once | INTRAVENOUS | Status: AC
  Administered 2019-08-29: 80 mg via INTRAVENOUS
  Filled 2019-08-29 (×2): qty 80

## 2019-08-29 MED ORDER — ONDANSETRON HCL 4 MG PO TABS: 4.0000 mg | ORAL_TABLET | Freq: Four times a day (QID) | ORAL | Status: DC | PRN

## 2019-08-29 MED ORDER — PHENOBARBITAL 32.4 MG PO TABS
64.8000 mg | ORAL_TABLET | Freq: Three times a day (TID) | ORAL | Status: AC
  Administered 2019-08-30: 64.8 mg via ORAL
  Filled 2019-08-29: qty 2

## 2019-08-29 MED ORDER — METHOCARBAMOL 500 MG PO TABS
750.0000 mg | ORAL_TABLET | Freq: Four times a day (QID) | ORAL | Status: DC
  Administered 2019-08-29 – 2019-09-02 (×15): 750 mg via ORAL
  Filled 2019-08-29 (×16): qty 2

## 2019-08-29 MED ORDER — TRAZODONE HCL 50 MG PO TABS
150.0000 mg | ORAL_TABLET | Freq: Every day | ORAL | Status: DC
  Administered 2019-08-30 – 2019-09-01 (×3): 150 mg via ORAL
  Filled 2019-08-29 (×4): qty 3

## 2019-08-29 MED ORDER — POLYETHYLENE GLYCOL 3350 17 G PO PACK
17.0000 g | PACK | Freq: Every day | ORAL | Status: DC | PRN
  Administered 2019-08-29: 17 g via ORAL
  Filled 2019-08-29: qty 1

## 2019-08-29 MED ORDER — LORAZEPAM 1 MG PO TABS: 1.0000 mg | ORAL_TABLET | ORAL | Status: AC | PRN

## 2019-08-29 MED ORDER — PHENOBARBITAL 32.4 MG PO TABS
32.4000 mg | ORAL_TABLET | ORAL | Status: DC
  Administered 2019-08-29: 32.4 mg via ORAL

## 2019-08-29 MED ORDER — HYDROMORPHONE HCL 1 MG/ML IJ SOLN
1.0000 mg | Freq: Once | INTRAMUSCULAR | Status: AC
  Administered 2019-08-29: 1 mg via INTRAVENOUS
  Filled 2019-08-29: qty 1

## 2019-08-29 MED ORDER — ONDANSETRON HCL 4 MG/2ML IJ SOLN
4.0000 mg | Freq: Four times a day (QID) | INTRAMUSCULAR | Status: DC | PRN
  Administered 2019-08-29 – 2019-09-02 (×6): 4 mg via INTRAVENOUS
  Filled 2019-08-29 (×7): qty 2

## 2019-08-29 MED ORDER — GABAPENTIN 300 MG PO CAPS
300.0000 mg | ORAL_CAPSULE | Freq: Three times a day (TID) | ORAL | Status: DC
  Administered 2019-08-29 – 2019-09-02 (×12): 300 mg via ORAL
  Filled 2019-08-29 (×13): qty 1

## 2019-08-29 MED ORDER — ACETAMINOPHEN 650 MG RE SUPP: 650.0000 mg | Freq: Four times a day (QID) | RECTAL | Status: DC | PRN

## 2019-08-29 MED ORDER — THIAMINE HCL 100 MG/ML IJ SOLN
100.0000 mg | Freq: Every day | INTRAMUSCULAR | Status: DC
  Administered 2019-09-01 – 2019-09-02 (×2): 100 mg via INTRAVENOUS
  Filled 2019-08-29 (×3): qty 2

## 2019-08-29 MED ORDER — ACETAMINOPHEN 325 MG PO TABS: 650.0000 mg | ORAL_TABLET | Freq: Four times a day (QID) | ORAL | Status: DC | PRN

## 2019-08-29 MED ORDER — PHENOBARBITAL 32.4 MG PO TABS
16.2000 mg | ORAL_TABLET | Freq: Two times a day (BID) | ORAL | Status: DC
  Administered 2019-09-01 – 2019-09-02 (×3): 16.2 mg via ORAL
  Filled 2019-08-29 (×3): qty 1

## 2019-08-29 MED ORDER — FOLIC ACID 1 MG PO TABS: 1.0000 mg | ORAL_TABLET | Freq: Every day | ORAL | Status: DC

## 2019-08-29 MED ORDER — LORAZEPAM 2 MG/ML IJ SOLN: 1.0000 mg | INTRAMUSCULAR | Status: AC | PRN

## 2019-08-29 MED ORDER — LACTULOSE 10 GM/15ML PO SOLN
30.0000 g | ORAL | Status: AC
  Administered 2019-08-29 (×2): 30 g via ORAL
  Filled 2019-08-29 (×2): qty 60

## 2019-08-29 MED ORDER — THIAMINE HCL 100 MG PO TABS
100.0000 mg | ORAL_TABLET | Freq: Every day | ORAL | Status: DC
  Administered 2019-08-29: 100 mg via ORAL
  Filled 2019-08-29: qty 1

## 2019-08-29 MED ORDER — BISACODYL 10 MG RE SUPP
10.0000 mg | Freq: Every day | RECTAL | Status: DC | PRN
  Filled 2019-08-29: qty 1

## 2019-08-29 MED ORDER — ADULT MULTIVITAMIN W/MINERALS CH
1.0000 | ORAL_TABLET | Freq: Every day | ORAL | Status: DC
  Administered 2019-08-29 – 2019-09-02 (×5): 1 via ORAL
  Filled 2019-08-29 (×5): qty 1

## 2019-08-29 MED ORDER — SODIUM CHLORIDE 0.9 % IV SOLN: 250.0000 mL | INTRAVENOUS | Status: DC | PRN

## 2019-08-29 MED ORDER — ALUM & MAG HYDROXIDE-SIMETH 200-200-20 MG/5ML PO SUSP
30.0000 mL | Freq: Once | ORAL | Status: AC
  Administered 2019-08-29: 30 mL via ORAL
  Filled 2019-08-29: qty 30

## 2019-08-29 MED ORDER — HYDROMORPHONE HCL 1 MG/ML IJ SOLN: 1.0000 mg | INTRAMUSCULAR | Status: DC | PRN

## 2019-08-29 MED ORDER — SUCRALFATE 1 GM/10ML PO SUSP
1.0000 g | Freq: Four times a day (QID) | ORAL | Status: DC
  Administered 2019-08-29 – 2019-08-30 (×5): 1 g via ORAL
  Filled 2019-08-29 (×6): qty 10

## 2019-08-29 MED ORDER — NICOTINE 14 MG/24HR TD PT24
14.0000 mg | MEDICATED_PATCH | Freq: Every day | TRANSDERMAL | Status: DC
  Administered 2019-08-29 – 2019-09-01 (×4): 14 mg via TRANSDERMAL
  Filled 2019-08-29 (×5): qty 1

## 2019-08-29 MED ORDER — HYDROXYZINE HCL 25 MG PO TABS: 50.0000 mg | ORAL_TABLET | Freq: Three times a day (TID) | ORAL | Status: DC

## 2019-08-29 MED ORDER — PANTOPRAZOLE SODIUM 40 MG IV SOLR
40.0000 mg | Freq: Once | INTRAVENOUS | Status: AC
  Administered 2019-08-29: 40 mg via INTRAVENOUS
  Filled 2019-08-29: qty 40

## 2019-08-29 MED ORDER — SODIUM CHLORIDE 0.9% FLUSH
3.0000 mL | Freq: Two times a day (BID) | INTRAVENOUS | Status: DC
  Administered 2019-08-29 – 2019-09-02 (×7): 3 mL via INTRAVENOUS

## 2019-08-29 MED ORDER — TRAMADOL HCL 50 MG PO TABS: 50.0000 mg | ORAL_TABLET | Freq: Three times a day (TID) | ORAL | Status: DC | PRN

## 2019-08-29 NOTE — ED Triage Notes (Signed)
Pt reports abd pain and constipation for a week. Pt says he has not had a good BM in a week. Reports feeling bloated.Pt reports passing two small, hard balls of stool yesterday. Reports abdominal pain as "burning". Reports he has not been drinking or using drugs.

## 2019-08-29 NOTE — TOC Transition Note (Signed)
Transition of Care [facility]) - CM/SW Discharge Note   Patient Details  Name: David Miranda MRN: [id number] Date of Birth: 04/29/1986  [facility]) CM/SW Contact:  Kayshawn Ozburn, Chauncey Reading, RN Phone Number: 08/29/2019, 3:44 PM   Clinical Narrative: " 34 y.o. 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    CM consulted for substance abuse resources. Discussed with patient. He reports he has still has resource paperwork from previous admissions. He does not need resources again and is not interested in rehab or treatment.   Patient has been referred to [facility] multiple times in the past for help with PCP.    Final next level of care: Home/Self Care        Discharge Plan and Services   Discharge Planning Services: CM Consult                                 Social Determinants of Health (SDOH) Interventions     Readmission Risk Interventions Readmission Risk Prevention Plan 04/14/2019 04/08/2019  Transportation Screening Complete Complete  PCP or Specialist Appt within 5-7 Days - Complete  Home Care Screening - Complete  Medication Review (RN CM) - Complete  Palliative Care Screening Not Applicable -  Medication Review (RN Care Manager) Complete -  Some recent data might be hidden

## 2019-08-29 NOTE — Consult Note (Addendum)
Referring Provider: No ref. provider found Primary Care Physician:  Patient, No Pcp Per Primary Gastroenterologist:  (previously) Dr. Oneida Alar - d/c from practice  Date of Admission: 08/29/19 Date of Consultation: 08/29/19  Reason for Consultation:  Severe anemia  HPI:  David Miranda is a 34 y.o. 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  He initially presented to the ER early this morning with complaints of abdominal pain, constipation, no good bowel movement in about a week.  Also with bloating and too hard, small stools yesterday.  Epigastric burning as well.  Denies any recent use of alcohol or recreational drugs.  Urine drug screen in February 2021 was positive for amphetamines, cocaine, THC.  Vitals are stable in the ER.  Labs significant for normal electrolytes, normal LFTs, normal kidney function.  Hemoglobin critically low at 5.8 with platelets of 805.  Fecal occult blood negative but no stool present in the vault.  Abdominal x-ray with moderate amount of colonic stool and nonobstructive bowel gas pattern.  He does admit intermittent Alka-Seltzer and recent ibuprofen 4 times daily.  No significant symptomatic improvement with GI cocktail.  Urine drug screen and UA not collected yet.  Lipase normal.  Ethanol negative.  SARS-CoV-2 by respiratory panel negative.  The patient was typed and crossed and ordered for 2 units PRBCs.  Tohis morning when I netered the room with the nurse, the patient was soundly sleeping. He was awoken by the nurse checking his tele monitor at which point he began wincing and saying he's having severe abdominal pain. States he had a bowel movement 2 days ago but "small, hard, and black." States he uses AlkaSeltzer 4 times a day and Ibuprofen daily (unknown number of times a day).  States he used cocaine 3 days ago "but it's out of my system because I don't do it every day." Denies other drugs to me. Told his RN he did heroine 4 days ago but no cocain. Still "unable" to provide a urine sample. Denies N/V. States he did spit up "A little bit of blood" yesterday. No other GI complaints.  Past Medical History:  Diagnosis Date  . GERD (gastroesophageal reflux disease)   . Pancreatitis     Past Surgical History:  Procedure Laterality Date  . ESOPHAGOGASTRODUODENOSCOPY (EGD) WITH PROPOFOL N/A 04/01/2019   Procedure: ESOPHAGOGASTRODUODENOSCOPY (EGD) WITH PROPOFOL;  Surgeon: Danie Binder, MD;  Location: [facility];  Service: Endoscopy;  Laterality: N/A;  . GASTROJEJUNOSTOMY N/A 04/04/2019   Procedure: GASTROJEJUNOSTOMY;  Surgeon: Virl Cagey, MD;  Location: [facility];  Service: General;  Laterality: N/A;  . GASTROJEJUNOSTOMY  04/14/2019   Procedure: REVISION GASTROJEJUNOSTOMY TO ROUX-EN-Y;  Surgeon: Virl Cagey, MD;  Location: [facility];  Service: General;;  . NO PAST SURGERIES    . TESTICLE SURGERY      Prior to Admission medications   Medication Sig Start Date End Date Taking? Authorizing Provider  acetaminophen (TYLENOL) 325 MG tablet Take 2 tablets (650 mg total) by mouth every 4 (four) hours as needed for mild pain, fever or headache. 04/08/19 04/07/20  Roxan Hockey, MD  dicyclomine (BENTYL) 20 MG tablet Take 1 tablet (20 mg total) by mouth 3 (three) times daily as needed for spasms. Patient not taking: Reported on 07/16/2019 05/23/19 05/22/20  Mina Marble P, DO  HYDROcodone-acetaminophen (NORCO) 5-325 MG tablet Take 1 tablet by mouth every 4 (  four) hours as needed for severe pain. Patient not taking: Reported on 07/16/2019 05/06/19   Virl Cagey, MD  lipase/protease/amylase (CREON) 36000 UNITS CPEP capsule Take 2 capsules (72,000 Units total) by mouth 3 (three) times daily with meals. Patient not taking: Reported on 07/16/2019 04/08/19   Roxan Hockey, MD  methocarbamol (ROBAXIN) 500 MG tablet Take 1 tablet (500 mg total) by mouth every 8 (eight) hours as needed for muscle spasms. Patient not taking: Reported on 07/16/2019 05/17/19   Barton Dubois, MD  Multiple Vitamin (MULTIVITAMIN WITH MINERALS) TABS tablet Take 1 tablet by mouth daily. Patient not taking: Reported on 07/16/2019 04/08/19   Roxan Hockey, MD  omeprazole (PRILOSEC) 40 MG capsule Take 1 capsule (40 mg total) by mouth 2 (two) times daily. Patient not taking: Reported on 07/16/2019 05/06/19   Virl Cagey, MD  sucralfate (CARAFATE) 1 GM/10ML suspension Take 10 mLs (1 g total) by mouth 4 (four) times daily. Patient not taking: Reported on 07/16/2019 05/23/19 06/22/19  Mina Marble P, DO  traZODone (DESYREL) 100 MG tablet Take 1 tablet (100 mg total) by mouth at bedtime. Patient not taking: Reported on 07/16/2019 05/17/19   Barton Dubois, MD    Current Facility-Administered Medications  Medication Dose Route Frequency Provider Last Rate Last Admin  . 0.9 %  sodium chloride infusion  250 mL Intravenous PRN Lynetta Mare, MD      . acetaminophen (TYLENOL) tablet 650 mg  650 mg Oral Q6H PRN Lynetta Mare, MD       Or  . acetaminophen (TYLENOL) suppository 650 mg  650 mg Rectal Q6H PRN Lynetta Mare, MD      . bisacodyl (DULCOLAX) suppository 10 mg  10 mg Rectal Daily PRN Lynetta Mare, MD      . folic acid (FOLVITE) tablet 1 mg  1 mg Oral Daily Lynetta Mare, MD      . HYDROmorphone (DILAUDID) injection 0.5 mg  0.5 mg Intravenous Q4H PRN Lynetta Mare, MD      . HYDROmorphone (DILAUDID) injection 1 mg  1 mg Intravenous Q4H PRN Lynetta Mare, MD   1 mg at 08/29/19 0727  . lactated ringers infusion   Intravenous Continuous Lynetta Mare, MD 75 mL/hr at 08/29/19 0742 New Bag at 08/29/19 0742  . multivitamin with minerals tablet 1 tablet  1 tablet Oral Daily Lynetta Mare, MD      . ondansetron [facility]) tablet 4 mg  4 mg Oral Q6H PRN  Lynetta Mare, MD       Or  . ondansetron [facility]) injection 4 mg  4 mg Intravenous Q6H PRN Lynetta Mare, MD   4 mg at 08/29/19 8366  . pantoprazole (PROTONIX) 80 mg in sodium chloride 0.9 % 100 mL (0.8 mg/mL) infusion  8 mg/hr Intravenous Continuous Lynetta Mare, MD      . pantoprazole (PROTONIX) 80 mg in sodium chloride 0.9 % 100 mL IVPB  80 mg Intravenous Once Lynetta Mare, MD      . polyethylene glycol (MIRALAX / GLYCOLAX) packet 17 g  17 g Oral Daily PRN Lynetta Mare, MD      . sodium chloride flush (NS) 0.9 % injection 3 mL  3 mL Intravenous Q12H Lynetta Mare, MD      . sodium chloride flush (NS) 0.9 % injection 3 mL  3 mL Intravenous PRN Lynetta Mare, MD      . sucralfate (CARAFATE) 1  GM/10ML suspension 1 g  1 g Oral QID Lynetta Mare, MD      . thiamine tablet 100 mg  100 mg Oral Daily Lynetta Mare, MD        Allergies as of 08/29/2019  . (No Known Allergies)    Family History  Problem Relation Age of Onset  . Cancer Other     Social History   Socioeconomic History  . Marital status: Single    Spouse name: Not on file  . Number of children: Not on file  . Years of education: Not on file  . Highest education level: Not on file  Occupational History  . Not on file  Tobacco Use  . Smoking status: Current Every Day Smoker    Packs/day: 1.00    Types: Cigarettes  . Smokeless tobacco: Never Used  Substance and Sexual Activity  . Alcohol use: Not Currently    Comment: pint every few days  . Drug use: Not Currently    Types: Marijuana    Comment: occasionally  . Sexual activity: Not on file  Other Topics Concern  . Not on file  Social History Narrative  . Not on file   Social Determinants of Health   Financial Resource Strain: High Risk  . Difficulty of Paying Living Expenses: Hard  Food Insecurity:   . Worried About Charity fundraiser in the Last Year:   . Arboriculturist in the Last Year:   Transportation Needs:   .  Film/video editor (Medical):   Marland Kitchen Lack of Transportation (Non-Medical):   Physical Activity:   . Days of Exercise per Week:   . Minutes of Exercise per Session:   Stress:   . Feeling of Stress :   Social Connections:   . Frequency of Communication with Friends and Family:   . Frequency of Social Gatherings with Friends and Family:   . Attends Religious Services:   . Active Member of Clubs or Organizations:   . Attends Archivist Meetings:   Marland Kitchen Marital Status:   Intimate Partner Violence:   . Fear of Current or Ex-Partner:   . Emotionally Abused:   Marland Kitchen Physically Abused:   . Sexually Abused:     Review of Systems: Review of Systems  Constitutional: Positive for malaise/fatigue.  HENT: Negative for hearing loss.   Respiratory: Negative for cough and shortness of breath.   Cardiovascular: Negative for chest pain and palpitations.  Gastrointestinal: Positive for abdominal pain, constipation, heartburn, melena and nausea.  Skin: Negative for itching and rash.  Neurological: Negative for loss of consciousness.  Psychiatric/Behavioral: Positive for substance abuse.     Vital signs in last 24 hours: Temp:  [97.6 F (36.4 C)-98 F (36.7 C)] 97.8 F (36.6 C) (03/26 0800) Pulse Rate:  [49-75] 50 (03/26 0800) Resp:  [10-20] 14 (03/26 0800) BP: (93-118)/(55-71) 93/57 (03/26 0800) SpO2:  [97 %-100 %] 100 % (03/26 0730) Weight:  [70.3 kg] 70.3 kg (03/26 0403)   Physical Exam Vitals and nursing note reviewed.  Constitutional:      Appearance: He is well-developed.  HENT:     Head: Normocephalic and atraumatic.  Eyes:     General: No scleral icterus. Cardiovascular:     Rate and Rhythm: Normal rate and regular rhythm.     Pulses:          Posterior tibial pulses are 2+ on the right side and 2+ on the left side.  Heart sounds: Normal heart sounds.  Pulmonary:     Effort: Pulmonary effort is normal. No respiratory distress.     Breath sounds: Normal breath  sounds.  Abdominal:     General: Abdomen is flat. Bowel sounds are normal. There is no distension.     Palpations: Abdomen is soft. There is no hepatomegaly, splenomegaly or mass.     Tenderness: There is abdominal tenderness in the right lower quadrant, epigastric area, suprapubic area and left lower quadrant. There is no rebound.     Hernia: No hernia is present.  Skin:    General: Skin is warm and dry.     Coloration: Skin is not cyanotic, jaundiced or pale.  Neurological:     Mental Status: He is alert and oriented to person, place, and time.  Psychiatric:        Attention and Perception: Attention normal.        Mood and Affect: Mood is anxious.        Speech: Speech is rapid and pressured.        Thought Content: Thought content normal.      Intake/Output from previous day: 03/25 0701 - 03/26 0700 In: 1325 [I.V.:10; Blood:315; IV Piggyback:1000] Out: -  Intake/Output this shift: Total I/O In: 315 [Blood:315] Out: -   Lab Results: Recent Labs    08/29/19 0407  WBC 13.6*  HGB 5.8*  HCT 20.3*  PLT 805*   BMET Recent Labs    08/29/19 0407  NA 139  K 3.8  CL 103  CO2 26  GLUCOSE 127*  BUN 19  CREATININE 0.83  CALCIUM 9.3   LFT Recent Labs    08/29/19 0407  PROT 7.4  ALBUMIN 3.7  AST 12*  ALT 10  ALKPHOS 62  BILITOT 0.3   PT/INR No results for input(s): LABPROT, INR in the last 72 hours. Hepatitis Panel No results for input(s): HEPBSAG, HCVAB, HEPAIGM, HEPBIGM in the last 72 hours. C-Diff No results for input(s): CDIFFTOX in the last 72 hours.  Studies/Results: DG ABD ACUTE 2+V W 1V CHEST  Result Date: 08/29/2019 CLINICAL DATA:  Abdominal pain EXAM: DG ABDOMEN ACUTE W/ 1V CHEST COMPARISON:  None. FINDINGS: There is no evidence of dilated bowel loops or free intraperitoneal air. Moderate amount of colonic stool. No radiopaque calculi or other significant radiographic abnormality is seen. Heart size and mediastinal contours are within normal  limits. Both lungs are clear. IMPRESSION: Moderate amount of colonic stool. Nonobstructive bowel gas pattern. No acute cardiopulmonary disease. Electronically Signed   By: Prudencio Pair M.D.   On: 08/29/2019 04:36    Impression: 34 year old male with a history of polysubstance abuse including cocaine and possibly heroin.  He was previously seen by our office but was discharged due to noncompliance and not showing to his appointments.  History of recurrent pancreatitis, necrotizing pancreatitis, pseudocyst, gastric outlet obstruction status post Roux-en-Y gastroenterostomy.  He presented with lower abdominal pain, constipation, epigastric pain, black stools, severe anemia.  Labs confirmed severe anemia.  Still in significant pain today per the patient.  Anemia: On presentation the emergency department the patient's hemoglobin was 5.8.  He admits Alka-Seltzer and/or ibuprofen both daily, Alka-Seltzer 4 times a day and unknown number of ibuprofen doses daily.  He is supposed to be on a twice daily PPI at home but states he does not take this because he does not have insurance.  He did admit to "spitting up" a small amount of blood yesterday.  Given his  presentation differentials include erosive reflux esophagitis, gastritis, duodenitis, peptic ulcer disease, duodenal ulcer.  He will need an upper endoscopy at some point.  However, his care is complicated by likely recent drug use, unknown as the patient has not provided a urine sample.  We will continue to encourage this.  Further recommendations to follow discussion with Dr. Parks Ranger and anesthesia.  Constipation: The patient describes constipation with minimal bowel movements over the last week.  He did have a bowel movement yesterday but "like rabbit pellets" with hard stools, straining, black stools.  This is likely contributing to his abdominal pain, especially his lower abdominal pain.  I will start him on Linzess to see if we can improve his bowel movements  and subsequent lower abdominal pain.  Of note abdominal x-ray shows significant stool burden but without obstructive bowel gas pattern.  Abdominal pain: Burning abdominal pain in the epigastrium and lower abdominal pain as well.  Likely multifactorial in nature in the setting of significant constipation and likely upper GI bleed due to to multiple ibuprofen products daily with likely ulcerative process.  Further constipation management as per above.  He is on a PPI twice daily and we will continue this which should help his upper abdominal pain.  He will need an EGD when we are able to discern his recent drug use and risks with anesthesia.  Plan: 1. UDS ASAP 2. NPO for now 3. Pain management per hospitalist 4. Continue PPI gtt 5. Will likely need EGD on propofol/MAC pending UDS 6. Linzess 145 mcg daily 7. Monitor hgb closely 8. Transfuse as necessary 9. Supportive measures   Thank you for allowing [place] to participate in the care of [facility]  Walden Field, DNP, AGNP-C Adult & Gerontological Nurse Practitioner [facility]    LOS: 0 days     08/29/2019, 8:21 AM

## 2019-08-29 NOTE — ED Notes (Signed)
X-Ray at bedside.

## 2019-08-29 NOTE — Progress Notes (Signed)
Patient Demographics:    David Miranda, is a 34 y.o. male, DOB - April 15, 1986, [id number]  Admit date - 08/29/2019   Admitting Physician Lynetta Mare, MD  Outpatient Primary MD for the patient is Patient, No Pcp Per  LOS - 0   Chief Complaint  Patient presents with  . Abdominal Pain    no BM for a week        Subjective:    [name] today has no fevers, no emesis,  No chest pain, abdominal pain noted --Constipation concerns, -Bradycardia noted, BP stable - (patient sleeps on and off--- only to wake up and start asking for pain medications)   Assessment  & Plan :    Principal Problem:   GI bleed Active Problems:   Alcohol abuse   Constipation   Essential hypertension   Tobacco abuse   GERD (gastroesophageal reflux disease)   Microcytic anemia   Polysubstance abuse (HCC)   Excessive use of nonsteroidal anti-inflammatory drug (NSAID)  Brief summary 34 y.o.malewith medical history significant ofalcohol abuse, chronic anemia, chronic alcoholic pancreatitis, recent gastric outlet obstruction status post gastrojejunostomy for decompression on 04/04/2019, with subsequent revision surgery and  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, as well as polysubstance abuse including methamphetamine, cocaine THC and heroin readmitted on 08/29/2019 with symptomatic anemia with hemoglobin of 5.8 in the setting of NSAID and polysubstance abuse -Prior EGD in October 2020 with esophagitis and gastritis   A/p 1) acute on chronic symptomatic microcytic anemia secondary to GI blood loss-the patient with alcoholic esophagitis and gastritis as well as ongoing NSAIDs use---- --acute blood loss anemia superimposed on chronic anemia --hemoglobin up to 7.2 after transfusion of 2 units of PRBC--- -stool occult blood negative -GI consult appreciated, -Anesthesia not  comfortable with sedation and EGD at this time due to UDS being positive for cocaine and amphetamines -Monitor H&H and transfuse as needed -Continue IV Protonix -N.p.o. for now  2)SevereAlcoholicEsophagitis andGastritis --- s/p EGD on 04/01/2019 with erosive esophagitis with bleeding-patient remains noncompliant with PPI due to polysubstance abuse  3)chronic alcoholicPancreatitis with history of pseudocyst-- as needed Zofran ---Continue Protonix --- May restartCreon when oral intake resumes ---Patient is advised to have abdominal imaging surveillance of his pancreatic pseudocyst every 6 months for the next 2 years to confirm stability  4)History of alcoholAbuse--- -lorazepam per CIWA protocol, multivitamin as ordered --Outpatient alcohol rehab program advised  5)Thrombocytosis--suspect this is reactive, hydrate,  6) polysubstance abuse--- UDS positive for cocaine, amphetamines, THC and opiates (patient admits to heroin use) --- Patient continues to decline outpatient rehab --Give phenobarbital detox protocol --Methocarbamol, hydroxyzine and gabapentin and trazodone as ordered to help blunt withdrawal symptoms  7) nicotine dependence----please give nicotine patch  Disposition/Need for in-Hospital Stay- patient unable to be discharged at this time due to --- acute symptomatic anemia due to GI blood loss--requiring transfusion and further monitoring especially given bradycardia  Code Status : Full  Family Communication:   NA (patient is alert, awake and coherent)  Consults  :  Gi  DVT Prophylaxis  :  SCDs ///GI bleed  Lab Results  Component Value Date   PLT 131 (L) 08/29/2019    Inpatient Medications  Scheduled Meds: . folic acid  1  mg Oral Daily  . [START ON 08/30/2019] linaclotide  145 mcg Oral QAC breakfast  . multivitamin with minerals  1 tablet Oral Daily  . pantoprazole (PROTONIX) IV  40 mg Intravenous Q12H  . sodium chloride flush  3 mL Intravenous Q12H  .  sucralfate  1 g Oral QID  . thiamine  100 mg Oral Daily   Continuous Infusions: . sodium chloride    . lactated ringers 75 mL/hr at 08/29/19 0742   PRN Meds:.sodium chloride, acetaminophen **OR** acetaminophen, bisacodyl, HYDROmorphone (DILAUDID) injection, HYDROmorphone (DILAUDID) injection, ondansetron **OR** ondansetron (ZOFRAN) IV, polyethylene glycol, sodium chloride flush    Anti-infectives (From admission, onward)   None        Objective:   Vitals:   08/29/19 0800 08/29/19 0815 08/29/19 1030 08/29/19 1327  BP: (!) 93/57 (!) 93/55 101/64 (!) 93/59  Pulse: (!) 50 (!) 48 (!) 41 (!) 54  Resp: 14 15 16 18   Temp: 97.8 F (36.6 C) 98 F (36.7 C) 98 F (36.7 C) (!) 97.3 F (36.3 C)  TempSrc: Oral Oral Oral Oral  SpO2: 98% 100% 100% 100%  Weight:        Wt Readings from Last 3 Encounters:  08/29/19 70.3 kg  07/16/19 70.3 kg  05/23/19 68 kg     Intake/Output Summary (Last 24 hours) at 08/29/2019 1445 Last data filed at 08/29/2019 1030 Gross per 24 hour  Intake 1640 ml  Output -  Net 1640 ml     Physical Exam  Gen:- Awake Alert,  In no apparent distress  HEENT:- [place].AT, No sclera icterus Neck-Supple Neck,No JVD,.  Lungs-  CTAB , fair symmetrical air movement CV- S1, S2 normal, regular , bradycardic Abd-  +ve B.Sounds, Abd Soft, epigastric and periumbilical tenderness, no rebound or guarding Extremity/Skin:- No  edema, pedal pulses present  Psych-affect is appropriate, oriented x3 (patient sleeps on and off--- only to wake up and start asking for pain medications) Neuro-no new focal deficits, no tremors   Data Review:   Micro Results Recent Results (from the past 240 hour(s))  Respiratory Panel by RT PCR (Flu A&B, Covid) - Nasopharyngeal Swab     Status: None   Collection Time: 08/29/19  4:55 AM   Specimen: Nasopharyngeal Swab  Result Value Ref Range Status   SARS Coronavirus 2 by RT PCR NEGATIVE NEGATIVE Final    Comment: (NOTE) SARS-CoV-2 target  nucleic acids are NOT DETECTED. The SARS-CoV-2 RNA is generally detectable in upper respiratoy specimens during the acute phase of infection. The lowest concentration of SARS-CoV-2 viral copies this assay can detect is 131 copies/mL. A negative result does not preclude SARS-Cov-2 infection and should not be used as the sole basis for treatment or other patient management decisions. A negative result may occur with  improper specimen collection/handling, submission of specimen other than nasopharyngeal swab, presence of viral mutation(s) within the areas targeted by this assay, and inadequate number of viral copies (<131 copies/mL). A negative result must be combined with clinical observations, patient history, and epidemiological information. The expected result is Negative. Fact Sheet for Patients:  PinkCheek.be Fact Sheet for Healthcare Providers:  GravelBags.it This test is not yet ap proved or cleared by the [place] FDA and  has been authorized for detection and/or diagnosis of SARS-CoV-2 by FDA under an Emergency Use Authorization (EUA). This EUA will remain  in effect (meaning this test can be used) for the duration of the COVID-19 declaration under Section 564(b)(1) of the Act, 21 U.S.C. section  360bbb-3(b)(1), unless the authorization is terminated or revoked sooner.    Influenza A by PCR NEGATIVE NEGATIVE Final   Influenza B by PCR NEGATIVE NEGATIVE Final    Comment: (NOTE) The Xpert Xpress SARS-CoV-2/FLU/RSV assay is intended as an aid in  the diagnosis of influenza from Nasopharyngeal swab specimens and  should not be used as a sole basis for treatment. Nasal washings and  aspirates are unacceptable for Xpert Xpress SARS-CoV-2/FLU/RSV  testing. Fact Sheet for Patients: PinkCheek.be Fact Sheet for Healthcare Providers: GravelBags.it This test is not  yet approved or cleared by the [place] FDA and  has been authorized for detection and/or diagnosis of SARS-CoV-2 by  FDA under an Emergency Use Authorization (EUA). This EUA will remain  in effect (meaning this test can be used) for the duration of the  Covid-19 declaration under Section 564(b)(1) of the Act, 21  U.S.C. section 360bbb-3(b)(1), unless the authorization is  terminated or revoked. Performed at [facility], [address] [place], [place] [address]     Radiology Reports DG ABD ACUTE 2+V W 1V CHEST  Result Date: 08/29/2019 CLINICAL DATA:  Abdominal pain EXAM: DG ABDOMEN ACUTE W/ 1V CHEST COMPARISON:  None. FINDINGS: There is no evidence of dilated bowel loops or free intraperitoneal air. Moderate amount of colonic stool. No radiopaque calculi or other significant radiographic abnormality is seen. Heart size and mediastinal contours are within normal limits. Both lungs are clear. IMPRESSION: Moderate amount of colonic stool. Nonobstructive bowel gas pattern. No acute cardiopulmonary disease. Electronically Signed   By: Prudencio Pair M.D.   On: 08/29/2019 04:36     CBC Recent Labs  Lab 08/29/19 0407 08/29/19 0933 08/29/19 1411  WBC 13.6* 4.3  --   HGB 5.8* 12.9* 7.2*  HCT 20.3* 39.4 25.2*  PLT 805* 131*  --   MCV 65.9* 90.8  --   MCH 18.8* 29.7  --   MCHC 28.6* 32.7  --   RDW 20.5* 12.2  --   LYMPHSABS 2.8  --   --   MONOABS 1.0  --   --   EOSABS 0.3  --   --   BASOSABS 0.1  --   --     Chemistries  Recent Labs  Lab 08/29/19 0407  NA 139  K 3.8  CL 103  CO2 26  GLUCOSE 127*  BUN 19  CREATININE 0.83  CALCIUM 9.3  AST 12*  ALT 10  ALKPHOS 62  BILITOT 0.3   ------------------------------------------------------------------------------------------------------------------ No results for input(s): CHOL, HDL, LDLCALC, TRIG, CHOLHDL, LDLDIRECT in the last 72 hours.  Lab Results  Component Value Date   HGBA1C 5.2 12/27/2017    ------------------------------------------------------------------------------------------------------------------ No results for input(s): TSH, T4TOTAL, T3FREE, THYROIDAB in the last 72 hours.  Invalid input(s): FREET3 ------------------------------------------------------------------------------------------------------------------ No results for input(s): VITAMINB12, FOLATE, FERRITIN, TIBC, IRON, RETICCTPCT in the last 72 hours.  Coagulation profile No results for input(s): INR, PROTIME in the last 168 hours.  No results for input(s): DDIMER in the last 72 hours.  Cardiac Enzymes No results for input(s): CKMB, TROPONINI, MYOGLOBIN in the last 168 hours.  Invalid input(s): CK ------------------------------------------------------------------------------------------------------------------ No results found for: BNP   Roxan Hockey M.D on 08/29/2019 at 2:45 PM  Go to [email or web address] - for contact info  [facility] - Office  [phone]

## 2019-08-29 NOTE — Progress Notes (Signed)
CBC from 0938 invalid per lab.  Phone call received @1350  08/29/19. Notified MD 1352, 08/29/2019

## 2019-08-29 NOTE — Progress Notes (Signed)
**Note De-identified  Obfuscation** EKG complete and placed in patient chart.  RN notified 

## 2019-08-29 NOTE — H&P (Signed)
History and Physical    Patient Demographics:    David Miranda [id number] DOB: 1985-10-14 DOA: 08/29/2019  PCP: Patient, No Pcp Per  Patient coming from: Home  I have personally briefly reviewed patient's old medical records in [place]  Chief Complaint: Abdominal pain   Assessment & Plan:     Assessment/Plan Principal Problem:   GI bleed Active Problems:   Alcohol abuse   Constipation   Essential hypertension   Tobacco abuse   GERD (gastroesophageal reflux disease)     Principal Problem: Acute on chronic microcytic anemia Hemoglobin 5.8 on presentation.  Previous hemoglobin was 11.0 in February.  Significantly low MCV noted.  Appears to be gradually worsening anemia secondary to slow GI bleed. -We will transfuse with a goal of around 8 -Monitor CBC closely  Other Active Problems: Upper GI bleed Patient presented with worsening anemia as noted above.  Reports having melena and mild hematemesis over the last 48 hours. Has a history of gastric bypass surgery with revision of gastrojejunostomy to Roux-en-Y gastroenterostomy 11/9.  Reports using NSAIDs at home for pain control.  Possibly ulcer at gastrostomy site versus NSAID induced gastritis. -We will place on PPI drip, sucralfate -GI consult in a.m. -Keep n.p.o. -Gentle IV fluid resuscitation  History of alcohol abuse Reports no alcohol intake since November.  No evidence of alcohol withdrawal currently. -We will monitor electrolytes -Multivitamin, thiamine, folic acid  Polysubstance abuse / Nicotine dependence Continues to smoke and also has a history of recreational drug use.  Urine drug screen in February was positive for amphetamines, cocaine, THC. -Cessation counseling has been provided  History of recurrent pancreatitis -Continue Creon when able to tolerate p.o.  DVT prophylaxis: SCDs Code Status:  Full code Family Communication: N/A  Disposition Plan: Admitted as inpatient for GI bleed,  anemia Consults called: N/A Admission status: Inpatient status    HPI:     HPI: David Miranda is a 34 y.o. 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  Patient states he has been feeling constipated and has not had a good bowel movement in about a week.  Has been feeling bloated.  Reports passing 2 small hard bowel movements yesterday.  Also has some epigastric burning.  Denies any recent use of alcohol or recreational drugs.  Urine drug screen in February 2021 was positive for amphetamines, cocaine, THC.  Alcohol level is negative today.  No fever, chills, shortness of breath, palpitations, dysuria, seizures, syncope, dizziness, lightheadedness, vomiting, diarrhea. ED Course:  Vital Signs reviewed on presentation, significant for temperature 97.8, heart rate 60, blood pressure 107/62, saturation 100% on room air. Labs reviewed, significant for sodium 139, potassium 3.8, BUN 19, creatinine 0.8, LFTs within normal limits, WBC count 13.6, hemoglobin 5.8, hematocrit 20, MCV 65, platelets 805.  Fecal occult blood is actually negative but no stool was present in the vault. Imaging personally Reviewed, abdominal x-ray shows moderate amount of colonic stool.  Nonobstructive bowel gas pattern.  No acute cardiopulmonary disease.    Review of systems:    Review of Systems: As per HPI otherwise 10 point review of systems negative.  All other review of systems is negative except the ones noted above in the HPI.    Past Medical and Surgical History:  Reviewed by me  Past Medical History:  Diagnosis Date  . GERD (gastroesophageal reflux  disease)   . Pancreatitis     Past Surgical History:  Procedure Laterality Date  . ESOPHAGOGASTRODUODENOSCOPY (EGD) WITH PROPOFOL N/A 04/01/2019    Procedure: ESOPHAGOGASTRODUODENOSCOPY (EGD) WITH PROPOFOL;  Surgeon: Danie Binder, MD;  Location: [facility];  Service: Endoscopy;  Laterality: N/A;  . GASTROJEJUNOSTOMY N/A 04/04/2019   Procedure: GASTROJEJUNOSTOMY;  Surgeon: Virl Cagey, MD;  Location: [facility] ORS;  Service: General;  Laterality: N/A;  . GASTROJEJUNOSTOMY  04/14/2019   Procedure: REVISION GASTROJEJUNOSTOMY TO ROUX-EN-Y;  Surgeon: Virl Cagey, MD;  Location: [facility];  Service: General;;  . NO PAST SURGERIES    . TESTICLE SURGERY       Social History:  Reviewed by me   reports that he has been smoking cigarettes. He has been smoking about 1.00 pack per day. He has never used smokeless tobacco. He reports previous alcohol use. He reports previous drug use. Drug: Marijuana.  Allergies:    No Known Allergies  Family History :   Family History  Problem Relation Age of Onset  . Cancer Other    Family history reviewed, noted as above, not pertinent to current presentation.   Home Medications:    Prior to Admission medications   Medication Sig Start Date End Date Taking? Authorizing Provider  acetaminophen (TYLENOL) 325 MG tablet Take 2 tablets (650 mg total) by mouth every 4 (four) hours as needed for mild pain, fever or headache. 04/08/19 04/07/20  Roxan Hockey, MD  dicyclomine (BENTYL) 20 MG tablet Take 1 tablet (20 mg total) by mouth 3 (three) times daily as needed for spasms. Patient not taking: Reported on 07/16/2019 05/23/19 05/22/20  Mina Marble P, DO  HYDROcodone-acetaminophen (NORCO) 5-325 MG tablet Take 1 tablet by mouth every 4 (four) hours as needed for severe pain. Patient not taking: Reported on 07/16/2019 05/06/19   Virl Cagey, MD  lipase/protease/amylase (CREON) 36000 UNITS CPEP capsule Take 2 capsules (72,000 Units total) by mouth 3 (three) times daily with meals. Patient not taking: Reported on 07/16/2019 04/08/19   Roxan Hockey, MD  methocarbamol (ROBAXIN) 500 MG tablet  Take 1 tablet (500 mg total) by mouth every 8 (eight) hours as needed for muscle spasms. Patient not taking: Reported on 07/16/2019 05/17/19   Barton Dubois, MD  Multiple Vitamin (MULTIVITAMIN WITH MINERALS) TABS tablet Take 1 tablet by mouth daily. Patient not taking: Reported on 07/16/2019 04/08/19   Roxan Hockey, MD  omeprazole (PRILOSEC) 40 MG capsule Take 1 capsule (40 mg total) by mouth 2 (two) times daily. Patient not taking: Reported on 07/16/2019 05/06/19   Virl Cagey, MD  sucralfate (CARAFATE) 1 GM/10ML suspension Take 10 mLs (1 g total) by mouth 4 (four) times daily. Patient not taking: Reported on 07/16/2019 05/23/19 06/22/19  Mina Marble P, DO  traZODone (DESYREL) 100 MG tablet Take 1 tablet (100 mg total) by mouth at bedtime. Patient not taking: Reported on 07/16/2019 05/17/19   Barton Dubois, MD    Physical Exam:    Physical Exam: Vitals:   08/29/19 0403 08/29/19 0405  BP:  118/71  Pulse:  75  Resp:  20  Temp:  97.8 F (36.6 C)  TempSrc:  Oral  SpO2:  97%  Weight: 70.3 kg     Constitutional: NAD, calm, comfortable Vitals:   08/29/19 0403 08/29/19 0405  BP:  118/71  Pulse:  75  Resp:  20  Temp:  97.8 F (36.6 C)  TempSrc:  Oral  SpO2:  97%  Weight: 70.3 kg  Eyes: PERRL, lids and conjunctivae normal ENMT: Mucous membranes are moist. Posterior pharynx clear of any exudate or lesions.Normal dentition.  Neck: normal, supple, no masses, no thyromegaly Respiratory: clear to auscultation bilaterally, no wheezing, no crackles. Normal respiratory effort. No accessory muscle use.  Cardiovascular: Regular rate and rhythm, no murmurs / rubs / gallops. No extremity edema. 2+ pedal pulses. No carotid bruits.  Abdomen: Significant epigastric tenderness with guarding, no masses palpated. No hepatosplenomegaly. Bowel sounds positive.  Musculoskeletal: no clubbing / cyanosis. No joint deformity upper and lower extremities. Good ROM, no contractures. Normal muscle  tone.  Skin: no rashes, lesions, ulcers. No induration Neurologic: CN 2-12 grossly intact. Sensation intact, DTR normal. Strength 5/5 in all 4.  Psychiatric: Normal judgment and insight. Alert and oriented x 3. Normal mood.    Decubitus Ulcers: Not present on admission Catheters and tubes: None  Data Review:    Labs on Admission: I have personally reviewed following labs and imaging studies  CBC: Recent Labs  Lab 08/29/19 0407  WBC 13.6*  NEUTROABS 9.3*  HGB 5.8*  HCT 20.3*  MCV 65.9*  PLT 123456*   Basic Metabolic Panel: Recent Labs  Lab 08/29/19 0407  NA 139  K 3.8  CL 103  CO2 26  GLUCOSE 127*  BUN 19  CREATININE 0.83  CALCIUM 9.3   GFR: Estimated Creatinine Clearance: 125.9 mL/min (by C-G formula based on SCr of 0.83 mg/dL). Liver Function Tests: Recent Labs  Lab 08/29/19 0407  AST 12*  ALT 10  ALKPHOS 62  BILITOT 0.3  PROT 7.4  ALBUMIN 3.7   Recent Labs  Lab 08/29/19 0407  LIPASE 27   No results for input(s): AMMONIA in the last 168 hours. Coagulation Profile: No results for input(s): INR, PROTIME in the last 168 hours. Cardiac Enzymes: No results for input(s): CKTOTAL, CKMB, CKMBINDEX, TROPONINI in the last 168 hours. BNP (last 3 results) No results for input(s): PROBNP in the last 8760 hours. HbA1C: No results for input(s): HGBA1C in the last 72 hours. CBG: No results for input(s): GLUCAP in the last 168 hours. Lipid Profile: No results for input(s): CHOL, HDL, LDLCALC, TRIG, CHOLHDL, LDLDIRECT in the last 72 hours. Thyroid Function Tests: No results for input(s): TSH, T4TOTAL, FREET4, T3FREE, THYROIDAB in the last 72 hours. Anemia Panel: No results for input(s): VITAMINB12, FOLATE, FERRITIN, TIBC, IRON, RETICCTPCT in the last 72 hours. Urine analysis:    Component Value Date/Time   COLORURINE YELLOW 07/16/2019 2110   APPEARANCEUR CLEAR 07/16/2019 2110   LABSPEC 1.017 07/16/2019 2110   PHURINE 8.0 07/16/2019 2110   GLUCOSEU NEGATIVE  07/16/2019 2110   HGBUR NEGATIVE 07/16/2019 2110   [place] NEGATIVE 07/16/2019 2110   [place] NEGATIVE 07/16/2019 2110   PROTEINUR NEGATIVE 07/16/2019 2110   NITRITE NEGATIVE 07/16/2019 2110   LEUKOCYTESUR NEGATIVE 07/16/2019 2110     Imaging Results:      Radiological Exams on Admission: DG ABD ACUTE 2+V W 1V CHEST  Result Date: 08/29/2019 CLINICAL DATA:  Abdominal pain EXAM: DG ABDOMEN ACUTE W/ 1V CHEST COMPARISON:  None. FINDINGS: There is no evidence of dilated bowel loops or free intraperitoneal air. Moderate amount of colonic stool. No radiopaque calculi or other significant radiographic abnormality is seen. Heart size and mediastinal contours are within normal limits. Both lungs are clear. IMPRESSION: Moderate amount of colonic stool. Nonobstructive bowel gas pattern. No acute cardiopulmonary disease. Electronically Signed   By: Prudencio Pair M.D.   On: 08/29/2019 04:36  Lynetta Mare MD [facility]  If 7PM-7AM, please contact night-coverage   08/29/2019, 5:10 AM

## 2019-08-29 NOTE — ED Provider Notes (Addendum)
[facility] EMERGENCY DEPARTMENT Provider Note   CSN: [id number] Arrival date & time: 08/29/19  0354   History Chief Complaint  Patient presents with  . Abdominal Pain    no BM for a week    David Miranda is a 34 y.o. male.  The history is provided by the patient.  He has history of hypertension, alcohol abuse, pancreatitis and comes in because of severe abdominal pain.  He had surgery for gastric outlet obstruction last October and he has been having some degree of pain ever since then.  However, pain has gotten much worse in the last week.  There has been mild nausea but he has not been vomiting.  He states that he last had a normal bowel movement about a week ago, but has had several small bowel movements since then.  He is passing small amount of flatus.  He has taken Alka-Seltzer which gives slight, temporary relief.  He states that he has not taken any other medications because he does not have insurance.  He denies current ethanol or drug use states it has been a year since his last ethanol use.  Past Medical History:  Diagnosis Date  . GERD (gastroesophageal reflux disease)   . Pancreatitis     Patient Active Problem List   Diagnosis Date Noted  . Intractable abdominal pain 05/16/2019  . SBO (small bowel obstruction) ([place])   . Peritonitis ([place]) 04/13/2019  . Gastric outlet obstruction   . Abdominal pain   . GI bleed 03/31/2019  . Emesis, persistent 03/30/2019  . Esophagitis 03/30/2019  . Pancreatic pseudocyst/cyst--alcoholic pancreatitis [id number]  . Acute necrotizing pancreatitis 07/24/2018  . Cannabis abuse 03/27/2018  . Acute kidney injury ([place])   . Hypomagnesemia   . Acute on chronic pancreatitis ([place]) 03/26/2018  . GERD (gastroesophageal reflux disease) 03/26/2018  . Acute renal failure (ARF) ([place]) 03/26/2018  . Hypokalemia 02/27/2018  . Essential hypertension 02/27/2018  . Tobacco abuse 02/27/2018  . Acute pancreatitis 02/27/2018  . Constipation   .  Annular pancreas   . Pancreatitis   . Non-intractable vomiting with nausea   . Hepatic steatosis   . 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 Leukocytosis 12/27/2017  . Abdominal pain, epigastric 12/27/2017  . Acute alcoholic pancreatitis 0000000  . Alcohol abuse 12/26/2017    Past Surgical History:  Procedure Laterality Date  . ESOPHAGOGASTRODUODENOSCOPY (EGD) WITH PROPOFOL N/A 04/01/2019   Procedure: ESOPHAGOGASTRODUODENOSCOPY (EGD) WITH PROPOFOL;  Surgeon: Danie Binder, MD;  Location: [facility] ENDO SUITE;  Service: Endoscopy;  Laterality: N/A;  . GASTROJEJUNOSTOMY N/A 04/04/2019   Procedure: GASTROJEJUNOSTOMY;  Surgeon: Virl Cagey, MD;  Location: [facility] ORS;  Service: General;  Laterality: N/A;  . GASTROJEJUNOSTOMY  04/14/2019   Procedure: REVISION GASTROJEJUNOSTOMY TO ROUX-EN-Y;  Surgeon: Virl Cagey, MD;  Location: [facility] ORS;  Service: General;;  . NO PAST SURGERIES    . TESTICLE SURGERY         Family History  Problem Relation Age of Onset  . Cancer Other     Social History   Tobacco Use  . Smoking status: Current Every Day Smoker    Packs/day: 1.00    Types: Cigarettes  . Smokeless tobacco: Never Used  Substance Use Topics  . Alcohol use: Not Currently    Comment: pint every few days  . Drug use: Not Currently    Types: Marijuana    Comment: occasionally    Home Medications Prior to Admission medications   Medication Sig Start Date End Date  Taking? Authorizing Provider  acetaminophen (TYLENOL) 325 MG tablet Take 2 tablets (650 mg total) by mouth every 4 (four) hours as needed for mild pain, fever or headache. 04/08/19 04/07/20  Roxan Hockey, MD  dicyclomine (BENTYL) 20 MG tablet Take 1 tablet (20 mg total) by mouth 3 (three) times daily as needed for spasms. Patient not taking: Reported on 07/16/2019 05/23/19 05/22/20  Mina Marble P, DO  HYDROcodone-acetaminophen (NORCO) 5-325 MG tablet Take 1 tablet by mouth every 4 (four) hours as needed for  severe pain. Patient not taking: Reported on 07/16/2019 05/06/19   Virl Cagey, MD  lipase/protease/amylase (CREON) 36000 UNITS CPEP capsule Take 2 capsules (72,000 Units total) by mouth 3 (three) times daily with meals. Patient not taking: Reported on 07/16/2019 04/08/19   Roxan Hockey, MD  methocarbamol (ROBAXIN) 500 MG tablet Take 1 tablet (500 mg total) by mouth every 8 (eight) hours as needed for muscle spasms. Patient not taking: Reported on 07/16/2019 05/17/19   Barton Dubois, MD  Multiple Vitamin (MULTIVITAMIN WITH MINERALS) TABS tablet Take 1 tablet by mouth daily. Patient not taking: Reported on 07/16/2019 04/08/19   Roxan Hockey, MD  omeprazole (PRILOSEC) 40 MG capsule Take 1 capsule (40 mg total) by mouth 2 (two) times daily. Patient not taking: Reported on 07/16/2019 05/06/19   Virl Cagey, MD  sucralfate (CARAFATE) 1 GM/10ML suspension Take 10 mLs (1 g total) by mouth 4 (four) times daily. Patient not taking: Reported on 07/16/2019 05/23/19 06/22/19  Mina Marble P, DO  traZODone (DESYREL) 100 MG tablet Take 1 tablet (100 mg total) by mouth at bedtime. Patient not taking: Reported on 07/16/2019 05/17/19   Barton Dubois, MD    Allergies    Patient has no known allergies.  Review of Systems   Review of Systems  All other systems reviewed and are negative.   Physical Exam Updated Vital Signs BP 118/71 (BP Location: Left Arm)   Pulse 75   Temp 97.8 F (36.6 C) (Oral)   Resp 20   Wt 70.3 kg   SpO2 97%   BMI 21.02 kg/m   Physical Exam Vitals and nursing note reviewed.   34 year old male, moaning in pain, but in no acute distress. Vital signs are normal. Oxygen saturation is 97%, which is normal. Head is normocephalic and atraumatic. PERRLA, EOMI. Oropharynx is clear. Neck is nontender and supple without adenopathy or JVD. Back is nontender and there is no CVA tenderness. Lungs are clear without rales, wheezes, or rhonchi. Chest is nontender. Heart  has regular rate and rhythm without murmur. Abdomen is soft, flat, with moderate tenderness diffusely and severe tenderness in the epigastric area.  There is no rebound or guarding.  There are no masses or hepatosplenomegaly and peristalsis is hypoactive. Rectal: Normal sphincter tone.  No masses.  No stool palpable or on the examining glove. Extremities have no cyanosis or edema, full range of motion is present. Skin is warm and dry without rash. Neurologic: Mental status is normal, cranial nerves are intact, there are no motor or sensory deficits.  ED Results / Procedures / Treatments   Labs (all labs ordered are listed, but only abnormal results are displayed) Labs Reviewed  COMPREHENSIVE METABOLIC PANEL - Abnormal; Notable for the following components:      Result Value   Glucose, Bld 127 (*)    AST 12 (*)    All other components within normal limits  CBC WITH DIFFERENTIAL/PLATELET - Abnormal; Notable for the following components:  WBC 13.6 (*)    RBC 3.08 (*)    Hemoglobin 5.8 (*)    HCT 20.3 (*)    MCV 65.9 (*)    MCH 18.8 (*)    MCHC 28.6 (*)    RDW 20.5 (*)    Platelets 805 (*)    Neutro Abs 9.3 (*)    Abs Immature Granulocytes 0.09 (*)    All other components within normal limits  RESPIRATORY PANEL BY RT PCR (FLU A&B, COVID)  LIPASE, BLOOD  ETHANOL  URINALYSIS, ROUTINE W REFLEX MICROSCOPIC  RAPID URINE DRUG SCREEN, HOSP PERFORMED  POC OCCULT BLOOD, ED  TYPE AND SCREEN  PREPARE RBC (CROSSMATCH)   Radiology DG ABD ACUTE 2+V W 1V CHEST  Result Date: 08/29/2019 CLINICAL DATA:  Abdominal pain EXAM: DG ABDOMEN ACUTE W/ 1V CHEST COMPARISON:  None. FINDINGS: There is no evidence of dilated bowel loops or free intraperitoneal air. Moderate amount of colonic stool. No radiopaque calculi or other significant radiographic abnormality is seen. Heart size and mediastinal contours are within normal limits. Both lungs are clear. IMPRESSION: Moderate amount of colonic stool.  Nonobstructive bowel gas pattern. No acute cardiopulmonary disease. Electronically Signed   By: Prudencio Pair M.D.   On: 08/29/2019 04:36    Procedures Procedures  CRITICAL CARE Performed by: Delora Fuel Total critical care time: 40 minutes Critical care time was exclusive of separately billable procedures and treating other patients. Critical care was necessary to treat or prevent imminent or life-threatening deterioration. Critical care was time spent personally by me on the following activities: development of treatment plan with patient and/or surrogate as well as nursing, discussions with consultants, evaluation of patient's response to treatment, examination of patient, obtaining history from patient or surrogate, ordering and performing treatments and interventions, ordering and review of laboratory studies, ordering and review of radiographic studies, pulse oximetry and re-evaluation of patient's condition.  Medications Ordered in ED Medications  0.9 %  sodium chloride infusion (has no administration in time range)  HYDROmorphone (DILAUDID) injection 1 mg (has no administration in time range)  ondansetron (ZOFRAN) injection 4 mg (4 mg Intravenous Given 08/29/19 0421)  sodium chloride 0.9 % bolus 1,000 mL (1,000 mLs Intravenous New Bag/Given 08/29/19 0421)  alum & mag hydroxide-simeth (MAALOX/MYLANTA) 200-200-20 MG/5ML suspension 30 mL (30 mLs Oral Given 08/29/19 0421)    And  lidocaine (XYLOCAINE) 2 % viscous mouth solution 15 mL (15 mLs Oral Given 08/29/19 0421)  pantoprazole (PROTONIX) injection 40 mg (40 mg Intravenous Given 08/29/19 0422)    ED Course  I have reviewed the triage vital signs and the nursing notes.  Pertinent labs & imaging results that were available during my care of the patient were reviewed by me and considered in my medical decision making (see chart for details).  MDM Rules/Calculators/A&P Ongoing abdominal pain.  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  He has been admitted for abdominal pain since then.  He has had several ED visits for abdominal pain.  He has had 11 CT scans of abdomen and pelvis in the last 2years, most recently in February.  We will try to avoid additional CT scans.  Screening labs are obtained and will get plain x-rays to look for evidence of obstruction.  We will give IV fluids, ondansetron, pantoprazole, and a GI cocktail.  Abdominal x-rays are unremarkable.  He had no relief of pain with GI cocktail.  Hemoglobin has come  back at 5.8 with platelets elevated to 805,000.  RBC indices are microcytic and hypochromic.  Last hemoglobin on record was February 10 at which time hemoglobin was 11.0 and platelets 924,000.  Rectal exam was done which yielded no stool and the mucus present was Hemoccult negative.  He is given 2 units of packed cells and will need to be admitted.  On further questioning, he does admit to using ibuprofen 4 times a day.  I suspect he has a gastric ulcer which is bleeding.  Urine drug screen is still pending.  It is noted that drug screen last month had been positive for cocaine and marijuana and amphetamines.  Case is discussed with Dr. Scherrie November of [facility], who agrees to admit the patient.  Final Clinical Impression(s) / ED Diagnoses Final diagnoses:  Intractable abdominal pain  Microcytic anemia  Thrombocytosis ([place])    Rx / DC Orders ED Discharge Orders    None       Delora Fuel, MD [id number]  I have discussed the case with Dr. Melony Overly of the gastroenterology service who states he will see the patient in consultation today.   Delora Fuel, MD [id number] [phone]

## 2019-08-29 NOTE — ED Notes (Signed)
Date and time results received: 08/29/19 0443   Test: Hgb Critical Value: 5.8  Name of Provider Notified: Roxanne Mins, MD

## 2019-08-30 DIAGNOSIS — K922 Gastrointestinal hemorrhage, unspecified: Secondary | ICD-10-CM

## 2019-08-30 DIAGNOSIS — I10 Essential (primary) hypertension: Secondary | ICD-10-CM

## 2019-08-30 LAB — CBC
HCT: 26.2 % — ABNORMAL LOW (ref 39.0–52.0)
HCT: 24.4 % — ABNORMAL LOW (ref 39.0–52.0)
Hemoglobin: 7.4 g/dL — ABNORMAL LOW (ref 13.0–17.0)
Hemoglobin: 7.2 g/dL — ABNORMAL LOW (ref 13.0–17.0)
MCH: 20.4 pg — ABNORMAL LOW (ref 26.0–34.0)
MCH: 21.3 pg — ABNORMAL LOW (ref 26.0–34.0)
MCHC: 28.2 g/dL — ABNORMAL LOW (ref 30.0–36.0)
MCHC: 29.5 g/dL — ABNORMAL LOW (ref 30.0–36.0)
MCV: 72.2 fL — ABNORMAL LOW (ref 80.0–100.0)
MCV: 72.2 fL — ABNORMAL LOW (ref 80.0–100.0)
Platelets: 762 10*3/uL — ABNORMAL HIGH (ref 150–400)
Platelets: 779 10*3/uL — ABNORMAL HIGH (ref 150–400)
RBC: 3.63 MIL/uL — ABNORMAL LOW (ref 4.22–5.81)
RBC: 3.38 MIL/uL — ABNORMAL LOW (ref 4.22–5.81)
RDW: 23.3 % — ABNORMAL HIGH (ref 11.5–15.5)
RDW: 23.6 % — ABNORMAL HIGH (ref 11.5–15.5)
WBC: 7.6 10*3/uL (ref 4.0–10.5)
WBC: 7.5 10*3/uL (ref 4.0–10.5)
nRBC: 0 % (ref 0.0–0.2)
nRBC: 0 % (ref 0.0–0.2)

## 2019-08-30 LAB — BPAM RBC
Blood Product Expiration Date: 202104262359
Blood Product Expiration Date: 202104272359
ISSUE DATE / TIME: 202103260535
ISSUE DATE / TIME: 202103260758
Unit Type and Rh: 9500
Unit Type and Rh: 9500

## 2019-08-30 LAB — TYPE AND SCREEN
ABO/RH(D): B NEG
Antibody Screen: NEGATIVE
Unit division: 0
Unit division: 0

## 2019-08-30 LAB — BASIC METABOLIC PANEL
Anion gap: 10 (ref 5–15)
BUN: 9 mg/dL (ref 6–20)
CO2: 24 mmol/L (ref 22–32)
Calcium: 9.2 mg/dL (ref 8.9–10.3)
Chloride: 105 mmol/L (ref 98–111)
Creatinine, Ser: 0.75 mg/dL (ref 0.61–1.24)
GFR calc Af Amer: >60 mL/min (ref >60)
GFR calc non Af Amer: >60 mL/min (ref >60)
Glucose, Bld: 95 mg/dL (ref 70–99)
Potassium: 4.1 mmol/L (ref 3.5–5.1)
Sodium: 139 mmol/L (ref 135–145)

## 2019-08-30 LAB — GLUCOSE, CAPILLARY
Glucose-Capillary: 112 mg/dL — ABNORMAL HIGH (ref 70–99)
Glucose-Capillary: 117 mg/dL — ABNORMAL HIGH (ref 70–99)
Glucose-Capillary: 137 mg/dL — ABNORMAL HIGH (ref 70–99)
Glucose-Capillary: 85 mg/dL (ref 70–99)
Glucose-Capillary: 91 mg/dL (ref 70–99)

## 2019-08-30 MED ORDER — OXYCODONE HCL 5 MG PO TABS
5.0000 mg | ORAL_TABLET | Freq: Four times a day (QID) | ORAL | Status: DC | PRN
  Administered 2019-08-30 – 2019-09-02 (×4): 5 mg via ORAL
  Filled 2019-08-30 (×5): qty 1

## 2019-08-30 MED ORDER — PANCRELIPASE (LIP-PROT-AMYL) 12000-38000 UNITS PO CPEP
72000.0000 [IU] | ORAL_CAPSULE | Freq: Three times a day (TID) | ORAL | Status: DC
  Administered 2019-08-30 – 2019-09-02 (×9): 72000 [IU] via ORAL
  Filled 2019-08-30 (×9): qty 6

## 2019-08-30 MED ORDER — ALUM & MAG HYDROXIDE-SIMETH 200-200-20 MG/5ML PO SUSP
30.0000 mL | ORAL | Status: DC | PRN
  Administered 2019-08-30 – 2019-08-31 (×2): 30 mL via ORAL
  Filled 2019-08-30 (×2): qty 30

## 2019-08-30 MED ORDER — HYDROMORPHONE HCL 1 MG/ML IJ SOLN
0.5000 mg | INTRAMUSCULAR | Status: DC | PRN
  Administered 2019-08-30 – 2019-09-02 (×15): 0.5 mg via INTRAVENOUS
  Filled 2019-08-30 (×16): qty 0.5

## 2019-08-30 NOTE — Progress Notes (Signed)
Patient Demographics:    David Miranda, is a 34 y.o. male, DOB - [birth year], [id number]  Admit date - 08/29/2019   Admitting Physician Lynetta Mare, MD  Outpatient Primary MD for the patient is Patient, No Pcp Per  LOS - 1   Chief Complaint  Patient presents with  . Abdominal Pain    no BM for a week        Subjective:    David Miranda today has no fevers, no emesis,  No chest pain, abdominal pain noted --- Requesting food, requesting iv opiates   Assessment  & Plan :    Principal Problem:   GI bleed Active Problems:   Alcohol abuse   Constipation   Essential hypertension   Tobacco abuse   GERD (gastroesophageal reflux disease)   Microcytic anemia   Polysubstance abuse (HCC)   Excessive use of nonsteroidal anti-inflammatory drug (NSAID)   Brief Summary 34 y.o.malewith medical history significant ofalcohol abuse, chronic anemia, chronic alcoholic pancreatitis, recent gastric outlet obstruction status post gastrojejunostomy for decompression on 04/04/2019, with subsequent revision surgery and  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, as well as polysubstance abuse including methamphetamine, cocaine THC and heroin readmitted on 08/29/2019 with symptomatic anemia with hemoglobin of 5.8 in the setting of NSAID and polysubstance abuse -Prior EGD in October 2020 with esophagitis and gastritis   A/p 1)Acute on chronic symptomatic microcytic anemia secondary to GI blood loss-the patient with alcoholic esophagitis and gastritis as well as ongoing NSAIDs use---- --acute blood loss anemia superimposed on chronic anemia --hemoglobin up to 7.0 after transfusion of 2 units of PRBC--- -stool occult blood negative -GI consult appreciated, -Anesthesia not comfortable with sedation and EGD at this time due to UDS being positive for cocaine and  amphetamines -Monitor H&H and transfuse as needed -Continue IV Protonix Plan is for EGD with propofol on 09/01/19 if repeat UDS is neg for Cocaine  2)SevereAlcoholicEsophagitis andGastritis --- s/p EGD on 04/01/2019 with erosive esophagitis with bleeding-patient remains noncompliant with PPI due to polysubstance abuse  3)Chronic AlcoholicPancreatitis with history of pseudocyst-- as needed Zofran ---Continue Protonix --- May restartCreon when oral intake resumes ---Patient is advised to have abdominal imaging surveillance of his pancreatic pseudocyst every 6 months for the next 2 years to confirm stability  4)History of alcoholAbuse--- -lorazepam per CIWA protocol, multivitamin as ordered --Outpatient alcohol rehab program advised  5)Thrombocytosis--suspect this is reactive, hydrate,  6)Polysubstance abuse--- UDS positive for Cocaine, Amphetamines, THC and opiates (patient admits to heroin use) --- Patient continues to decline outpatient drug rehab --c/n Phenobarbital Detox protocol --Methocarbamol, hydroxyzine and gabapentin and trazodone as ordered to help blunt withdrawal symptoms  7)Nicotine Dependence----c/n  nicotine patch  Disposition/Need for in-Hospital Stay- patient unable to be discharged at this time due to --- acute symptomatic anemia due to GI blood loss--requiring transfusion and further monitoring especially given bradycardia -Plan is for EGD with propofol on 09/01/19 if repeat UDS is neg for Cocaine  Code Status : Full  Family Communication:   NA (patient is alert, awake and coherent)  Consults  :  Gi  DVT Prophylaxis  :  SCDs ///GI bleed  Lab Results  Component Value Date   PLT 762 (H) 08/30/2019  Inpatient Medications  Scheduled Meds: . bisacodyl  10 mg Rectal QHS  . folic acid  1 mg Oral Daily  . gabapentin  300 mg Oral TID  . hydrOXYzine  25 mg Oral TID  . linaclotide  145 mcg Oral QAC breakfast  . lipase/protease/amylase  72,000 Units  Oral TID AC  . methocarbamol  750 mg Oral QID  . multivitamin with minerals  1 tablet Oral Daily  . nicotine  14 mg Transdermal Daily  . pantoprazole (PROTONIX) IV  40 mg Intravenous Q12H  . [START ON 09/01/2019] phenobarbital  16.2 mg Oral Q12H  . phenobarbital  32.4 mg Oral Q8H  . [START ON 08/31/2019] phenobarbital  32.4 mg Oral Q12H  . sodium chloride flush  3 mL Intravenous Q12H  . thiamine  100 mg Oral Daily   Or  . thiamine  100 mg Intravenous Daily  . traZODone  150 mg Oral QHS   Continuous Infusions: . sodium chloride    . lactated ringers 75 mL/hr at 08/30/19 1247   PRN Meds:.sodium chloride, acetaminophen **OR** acetaminophen, alum & mag hydroxide-simeth, bisacodyl, HYDROmorphone (DILAUDID) injection, LORazepam **OR** LORazepam, ondansetron **OR** ondansetron (ZOFRAN) IV, oxyCODONE, polyethylene glycol, sodium chloride flush    Anti-infectives (From admission, onward)   None        Objective:   Vitals:   08/30/19 0034 08/30/19 0500 08/30/19 0630 08/30/19 1313  BP:   111/75 133/80  Pulse: (!) 53  64 75  Resp:   18 20  Temp:   (!) 97.5 F (36.4 C) 97.9 F (36.6 C)  TempSrc:   Oral Oral  SpO2:   99% 100%  Weight:  68.5 kg      Wt Readings from Last 3 Encounters:  08/30/19 68.5 kg  07/16/19 70.3 kg  05/23/19 68 kg     Intake/Output Summary (Last 24 hours) at 08/30/2019 1833 Last data filed at 08/30/2019 0300 Gross per 24 hour  Intake 1446.48 ml  Output --  Net 1446.48 ml     Physical Exam  Gen:- Awake Alert,  In no apparent distress  HEENT:- [place].AT, No sclera icterus Neck-Supple Neck,No JVD,.  Lungs-  CTAB , fair symmetrical air movement CV- S1, S2 normal, regular ,  Abd-  +ve B.Sounds, Abd Soft, epigastric and periumbilical tenderness, no rebound or guarding Extremity/Skin:- No  edema, pedal pulses present  Psych-affect is appropriate, oriented x3 Neuro-no new focal deficits, no tremors   Data Review:   Micro Results Recent Results (from the  past 240 hour(s))  Respiratory Panel by RT PCR (Flu A&B, Covid) - Nasopharyngeal Swab     Status: None   Collection Time: 08/29/19  4:55 AM   Specimen: Nasopharyngeal Swab  Result Value Ref Range Status   SARS Coronavirus 2 by RT PCR NEGATIVE NEGATIVE Final    Comment: (NOTE) SARS-CoV-2 target nucleic acids are NOT DETECTED. The SARS-CoV-2 RNA is generally detectable in upper respiratoy specimens during the acute phase of infection. The lowest concentration of SARS-CoV-2 viral copies this assay can detect is 131 copies/mL. A negative result does not preclude SARS-Cov-2 infection and should not be used as the sole basis for treatment or other patient management decisions. A negative result may occur with  improper specimen collection/handling, submission of specimen other than nasopharyngeal swab, presence of viral mutation(s) within the areas targeted by this assay, and inadequate number of viral copies (<131 copies/mL). A negative result must be combined with clinical observations, patient history, and epidemiological information. The expected result is  Negative. Fact Sheet for Patients:  PinkCheek.be Fact Sheet for Healthcare Providers:  GravelBags.it This test is not yet ap proved or cleared by the [place] FDA and  has been authorized for detection and/or diagnosis of SARS-CoV-2 by FDA under an Emergency Use Authorization (EUA). This EUA will remain  in effect (meaning this test can be used) for the duration of the COVID-19 declaration under Section 564(b)(1) of the Act, 21 U.S.C. section 360bbb-3(b)(1), unless the authorization is terminated or revoked sooner.    Influenza A by PCR NEGATIVE NEGATIVE Final   Influenza B by PCR NEGATIVE NEGATIVE Final    Comment: (NOTE) The Xpert Xpress SARS-CoV-2/FLU/RSV assay is intended as an aid in  the diagnosis of influenza from Nasopharyngeal swab specimens and  should not be  used as a sole basis for treatment. Nasal washings and  aspirates are unacceptable for Xpert Xpress SARS-CoV-2/FLU/RSV  testing. Fact Sheet for Patients: PinkCheek.be Fact Sheet for Healthcare Providers: GravelBags.it This test is not yet approved or cleared by the [place] FDA and  has been authorized for detection and/or diagnosis of SARS-CoV-2 by  FDA under an Emergency Use Authorization (EUA). This EUA will remain  in effect (meaning this test can be used) for the duration of the  Covid-19 declaration under Section 564(b)(1) of the Act, 21  U.S.C. section 360bbb-3(b)(1), unless the authorization is  terminated or revoked. Performed at [facility], [address], [place], [place] [address]     Radiology Reports DG ABD ACUTE 2+V W 1V CHEST  Result Date: 08/29/2019 CLINICAL DATA:  Abdominal pain EXAM: DG ABDOMEN ACUTE W/ 1V CHEST COMPARISON:  None. FINDINGS: There is no evidence of dilated bowel loops or free intraperitoneal air. Moderate amount of colonic stool. No radiopaque calculi or other significant radiographic abnormality is seen. Heart size and mediastinal contours are within normal limits. Both lungs are clear. IMPRESSION: Moderate amount of colonic stool. Nonobstructive bowel gas pattern. No acute cardiopulmonary disease. Electronically Signed   By: Prudencio Pair M.D.   On: 08/29/2019 04:36     CBC Recent Labs  Lab 08/29/19 0407 08/29/19 0933 08/29/19 1411 08/29/19 2204 08/30/19 0625  WBC 13.6* 4.3  --  9.1 7.6  HGB 5.8* 12.9* 7.2* 7.0* 7.4*  HCT 20.3* 39.4 25.2* 23.7* 26.2*  PLT 805* 131*  --  600* 762*  MCV 65.9* 90.8  --  72.7* 72.2*  MCH 18.8* 29.7  --  21.5* 20.4*  MCHC 28.6* 32.7  --  29.5* 28.2*  RDW 20.5* 12.2  --  23.1* 23.3*  LYMPHSABS 2.8  --   --   --   --   MONOABS 1.0  --   --   --   --   EOSABS 0.3  --   --   --   --   BASOSABS 0.1  --   --   --   --     Chemistries  Recent Labs    Lab 08/29/19 0407 08/29/19 1411 08/30/19 0625  NA 139  --  139  K 3.8  --  4.1  CL 103  --  105  CO2 26  --  24  GLUCOSE 127*  --  95  BUN 19  --  9  CREATININE 0.83  --  0.75  CALCIUM 9.3  --  9.2  MG  --  2.1  --   AST 12*  --   --   ALT 10  --   --   [facility]  62  --   --   BILITOT 0.3  --   --    ------------------------------------------------------------------------------------------------------------------ No results for input(s): CHOL, HDL, LDLCALC, TRIG, CHOLHDL, LDLDIRECT in the last 72 hours.  Lab Results  Component Value Date   HGBA1C 5.2 12/27/2017   ------------------------------------------------------------------------------------------------------------------ No results for input(s): TSH, T4TOTAL, T3FREE, THYROIDAB in the last 72 hours.  Invalid input(s): FREET3 ------------------------------------------------------------------------------------------------------------------ No results for input(s): VITAMINB12, FOLATE, FERRITIN, TIBC, IRON, RETICCTPCT in the last 72 hours.  Coagulation profile No results for input(s): INR, PROTIME in the last 168 hours.  No results for input(s): DDIMER in the last 72 hours.  Cardiac Enzymes No results for input(s): CKMB, TROPONINI, MYOGLOBIN in the last 168 hours.  Invalid input(s): CK ------------------------------------------------------------------------------------------------------------------ No results found for: BNP   Roxan Hockey M.D on 08/30/2019 at 6:33 PM  Go to [email or web address] - for contact info  [facility] - Office  [phone]

## 2019-08-30 NOTE — Progress Notes (Signed)
Patient says epigastric pain somewhat lessened.  Had 2 large normal-appearing bowel movements overnight.  Patient states he is very hungry.  Vital signs in last 24 hours: Temp:  [97.3 F (36.3 C)-98.2 F (36.8 C)] 97.5 F (36.4 C) (03/27 0630) Pulse Rate:  [43-73] 64 (03/27 0630) Resp:  [15-18] 18 (03/27 0630) BP: (93-123)/(59-90) 111/75 (03/27 0630) SpO2:  [98 %-100 %] 99 % (03/27 0630) Weight:  [68.5 kg] 68.5 kg (03/27 0500) Last BM Date: 08/24/19 General:   Alert,  pleasant and cooperative in NAD Abdomen: Nondistended.  Positive bowel sounds.  Some epigastric tenderness to palpation.  No obvious mass organomegaly.  No rebound.   Extremities:  Without clubbing or edema.    Intake/Output from previous day: 03/26 0701 - 03/27 0700 In: 2721.5 [P.O.:360; I.V.:1446.5; Blood:915] Out: -  Intake/Output this shift: No intake/output data recorded.  Lab Results: Recent Labs    08/29/19 0933 08/29/19 0933 08/29/19 1411 08/29/19 2204 08/30/19 0625  WBC 4.3  --   --  9.1 7.6  HGB 12.9*   < > 7.2* 7.0* 7.4*  HCT 39.4   < > 25.2* 23.7* 26.2*  PLT 131*  --   --  600* 762*   < > = values in this interval not displayed.   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  Probable subacute GI bleed. Hemoglobin seems to have settled out in an appropriate range after transfusion consistent with cessation of any acute bleeding. EGD has been delayed secondary to  cocaine positive drug screen.  Discussed the relevance of a positive drug screen and a negative impact on his evaluation. Significant constipation appears to have been relieved overnight with Linzess.  Recommendations: I discussed attempting to perform an EGD with propofol on 3/29 assuming repeat urine drug screen on that day is negative.  Patient seems to understand the importance of this approach.  For now, will allow a low residue heart healthy diet through 1500 tomorrow; then back to clear liquids in anticipation of 3/29 EGD. Will prescribe pancreatic enzyme supplements with meals.  Discontinue sucralfate for now.  Continue PPI.  Discussed with Dr. Joesph Fillers earlier today.

## 2019-08-31 LAB — GLUCOSE, CAPILLARY
Glucose-Capillary: 100 mg/dL — ABNORMAL HIGH (ref 70–99)
Glucose-Capillary: 108 mg/dL — ABNORMAL HIGH (ref 70–99)
Glucose-Capillary: 112 mg/dL — ABNORMAL HIGH (ref 70–99)
Glucose-Capillary: 95 mg/dL (ref 70–99)
Glucose-Capillary: 98 mg/dL (ref 70–99)

## 2019-08-31 NOTE — Progress Notes (Signed)
Patient Demographics:    David Miranda, is a 34 y.o. male, DOB - Dec 16, 1985, [id number]  Admit date - 08/29/2019   Admitting Physician Lynetta Mare, MD  Outpatient Primary MD for the patient is Patient, No Pcp Per  LOS - 2   Chief Complaint  Patient presents with  . Abdominal Pain    no BM for a week        Subjective:    David Miranda today has no fevers, no emesis,  No chest pain,  --Tolerating liquid diet okay, complains of abdominal pain --Had brown BM  --Threatening to leave [place] if he does not get additional IV pain medications  --- RN Danielle at bedside   Assessment  & Plan :    Principal Problem:   GI bleed Active Problems:   Alcohol abuse   Constipation   Essential hypertension   Tobacco abuse   GERD (gastroesophageal reflux disease)   Microcytic anemia   Polysubstance abuse (HCC)   Excessive use of nonsteroidal anti-inflammatory drug (NSAID)   Brief Summary 34 y.o.malewith medical history significant ofalcohol abuse, chronic anemia, chronic alcoholic pancreatitis, recent gastric outlet obstruction status post gastrojejunostomy for decompression on 04/04/2019, with subsequent revision surgery and  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, as well as polysubstance abuse including methamphetamine, cocaine THC and heroin readmitted on 08/29/2019 with symptomatic anemia with hemoglobin of 5.8 in the setting of NSAID and polysubstance abuse -Prior EGD in October 2020 with esophagitis and gastritis   A/p 1)Acute on chronic symptomatic microcytic anemia secondary to GI blood loss-the patient with alcoholic esophagitis and gastritis as well as ongoing NSAIDs use---- --acute blood loss anemia superimposed on chronic anemia --hemoglobin up to 7.2 after transfusion of 2 units of PRBC--- -stool occult blood  negative -GI consult appreciated, -Anesthesia not comfortable with sedation and EGD at this time due to UDS being positive for cocaine and amphetamines -Monitor H&H and transfuse as needed -Continue IV Protonix Plan is for EGD with propofol on 09/01/19 if repeat UDS is neg for Cocaine  2)SevereAlcoholicEsophagitis andGastritis --- s/p EGD on 04/01/2019 with erosive esophagitis with bleeding-patient remains noncompliant with PPI due to polysubstance abuse --Continue IV PPI  3)Chronic AlcoholicPancreatitis with history of pseudocyst-- as needed Zofran ---Continue Protonix ---c/n Creon even with liquid diet ---Patient is advised to have abdominal imaging surveillance of his pancreatic pseudocyst every 6 months for the next 2 years to confirm stability  4)History of alcoholAbuse--- -lorazepam per CIWA protocol, multivitamin as ordered --Outpatient alcohol rehab program advised  5)Thrombocytosis--suspect this is reactive, hydrate,  6)Polysubstance abuse--- UDS positive for Cocaine, Amphetamines, THC and opiates  --- Patient continues to decline outpatient drug rehab --c/n Phenobarbital Detox protocol --Methocarbamol, hydroxyzine and gabapentin and trazodone as ordered to help blunt withdrawal symptoms  7)Nicotine Dependence----c/n  nicotine patch  Disposition/Need for in-Hospital Stay- patient unable to be discharged at this time due to --- acute symptomatic anemia due to GI blood loss--requiring transfusion and further monitoring especially given bradycardia -Plan is for EGD with propofol on 09/01/19 if repeat UDS is neg for Cocaine  Code Status : Full  Family Communication:   NA (patient is alert, awake and coherent)  Consults  :  Gi  DVT  Prophylaxis  :  SCDs ///GI bleed  Lab Results  Component Value Date   PLT 779 (H) 08/30/2019    Inpatient Medications  Scheduled Meds: . bisacodyl  10 mg Rectal QHS  . folic acid  1 mg Oral Daily  . gabapentin  300 mg Oral TID   . hydrOXYzine  25 mg Oral TID  . linaclotide  145 mcg Oral QAC breakfast  . lipase/protease/amylase  72,000 Units Oral TID AC  . methocarbamol  750 mg Oral QID  . multivitamin with minerals  1 tablet Oral Daily  . nicotine  14 mg Transdermal Daily  . pantoprazole (PROTONIX) IV  40 mg Intravenous Q12H  . [START ON 09/01/2019] phenobarbital  16.2 mg Oral Q12H  . phenobarbital  32.4 mg Oral Q12H  . sodium chloride flush  3 mL Intravenous Q12H  . thiamine  100 mg Oral Daily   Or  . thiamine  100 mg Intravenous Daily  . traZODone  150 mg Oral QHS   Continuous Infusions: . sodium chloride    . lactated ringers 75 mL/hr at 08/30/19 1247   PRN Meds:.sodium chloride, acetaminophen **OR** acetaminophen, alum & mag hydroxide-simeth, bisacodyl, HYDROmorphone (DILAUDID) injection, LORazepam **OR** LORazepam, ondansetron **OR** ondansetron (ZOFRAN) IV, oxyCODONE, polyethylene glycol, sodium chloride flush    Anti-infectives (From admission, onward)   None        Objective:   Vitals:   08/30/19 1313 08/31/19 0015 08/31/19 0600 08/31/19 0619  BP: 133/80 99/70  121/72  Pulse: 75 (!) 53  (!) 58  Resp: 20 20  16   Temp: 97.9 F (36.6 C) 97.8 F (36.6 C)    TempSrc: Oral Oral    SpO2: 100% 100%  99%  Weight:   67.9 kg     Wt Readings from Last 3 Encounters:  08/31/19 67.9 kg  07/16/19 70.3 kg  05/23/19 68 kg    No intake or output data in the 24 hours ending 08/31/19 1008   Physical Exam  Gen:- Awake Alert,  In no apparent distress  HEENT:- [place].AT, No sclera icterus Neck-Supple Neck,No JVD,.  Lungs-  CTAB , fair symmetrical air movement CV- S1, S2 normal, regular ,  Abd-  +ve B.Sounds, Abd Soft, epigastric and periumbilical tenderness, no rebound or guarding Extremity/Skin:- No  edema, pedal pulses present  Psych-affect is appropriate, oriented x3 Neuro-no new focal deficits, no tremors   Data Review:   Micro Results Recent Results (from the past 240 hour(s))   Respiratory Panel by RT PCR (Flu A&B, Covid) - Nasopharyngeal Swab     Status: None   Collection Time: 08/29/19  4:55 AM   Specimen: Nasopharyngeal Swab  Result Value Ref Range Status   SARS Coronavirus 2 by RT PCR NEGATIVE NEGATIVE Final    Comment: (NOTE) SARS-CoV-2 target nucleic acids are NOT DETECTED. The SARS-CoV-2 RNA is generally detectable in upper respiratoy specimens during the acute phase of infection. The lowest concentration of SARS-CoV-2 viral copies this assay can detect is 131 copies/mL. A negative result does not preclude SARS-Cov-2 infection and should not be used as the sole basis for treatment or other patient management decisions. A negative result may occur with  improper specimen collection/handling, submission of specimen other than nasopharyngeal swab, presence of viral mutation(s) within the areas targeted by this assay, and inadequate number of viral copies (<131 copies/mL). A negative result must be combined with clinical observations, patient history, and epidemiological information. The expected result is Negative. Fact Sheet for Patients:  PinkCheek.be Fact  Sheet for Healthcare Providers:  GravelBags.it This test is not yet ap proved or cleared by the [place] FDA and  has been authorized for detection and/or diagnosis of SARS-CoV-2 by FDA under an Emergency Use Authorization (EUA). This EUA will remain  in effect (meaning this test can be used) for the duration of the COVID-19 declaration under Section 564(b)(1) of the Act, 21 U.S.C. section 360bbb-3(b)(1), unless the authorization is terminated or revoked sooner.    Influenza A by PCR NEGATIVE NEGATIVE Final   Influenza B by PCR NEGATIVE NEGATIVE Final    Comment: (NOTE) The Xpert Xpress SARS-CoV-2/FLU/RSV assay is intended as an aid in  the diagnosis of influenza from Nasopharyngeal swab specimens and  should not be used as a sole  basis for treatment. Nasal washings and  aspirates are unacceptable for Xpert Xpress SARS-CoV-2/FLU/RSV  testing. Fact Sheet for Patients: PinkCheek.be Fact Sheet for Healthcare Providers: GravelBags.it This test is not yet approved or cleared by the [place] FDA and  has been authorized for detection and/or diagnosis of SARS-CoV-2 by  FDA under an Emergency Use Authorization (EUA). This EUA will remain  in effect (meaning this test can be used) for the duration of the  Covid-19 declaration under Section 564(b)(1) of the Act, 21  U.S.C. section 360bbb-3(b)(1), unless the authorization is  terminated or revoked. Performed at [facility], [address], [place], [place] [address]     Radiology Reports DG ABD ACUTE 2+V W 1V CHEST  Result Date: 08/29/2019 CLINICAL DATA:  Abdominal pain EXAM: DG ABDOMEN ACUTE W/ 1V CHEST COMPARISON:  None. FINDINGS: There is no evidence of dilated bowel loops or free intraperitoneal air. Moderate amount of colonic stool. No radiopaque calculi or other significant radiographic abnormality is seen. Heart size and mediastinal contours are within normal limits. Both lungs are clear. IMPRESSION: Moderate amount of colonic stool. Nonobstructive bowel gas pattern. No acute cardiopulmonary disease. Electronically Signed   By: Prudencio Pair M.D.   On: 08/29/2019 04:36     CBC Recent Labs  Lab 08/29/19 0407 08/29/19 0407 08/29/19 0933 08/29/19 1411 08/29/19 2204 08/30/19 0625 08/30/19 2201  WBC 13.6*  --  4.3  --  9.1 7.6 7.5  HGB 5.8*   < > 12.9* 7.2* 7.0* 7.4* 7.2*  HCT 20.3*   < > 39.4 25.2* 23.7* 26.2* 24.4*  PLT 805*  --  131*  --  600* 762* 779*  MCV 65.9*  --  90.8  --  72.7* 72.2* 72.2*  MCH 18.8*  --  29.7  --  21.5* 20.4* 21.3*  MCHC 28.6*  --  32.7  --  29.5* 28.2* 29.5*  RDW 20.5*  --  12.2  --  23.1* 23.3* 23.6*  LYMPHSABS 2.8  --   --   --   --   --   --   MONOABS 1.0  --   --    --   --   --   --   EOSABS 0.3  --   --   --   --   --   --   BASOSABS 0.1  --   --   --   --   --   --    < > = values in this interval not displayed.    Chemistries  Recent Labs  Lab 08/29/19 0407 08/29/19 1411 08/30/19 0625  NA 139  --  139  K 3.8  --  4.1  CL 103  --  105  CO2 26  --  24  GLUCOSE 127*  --  95  BUN 19  --  9  CREATININE 0.83  --  0.75  CALCIUM 9.3  --  9.2  MG  --  2.1  --   AST 12*  --   --   ALT 10  --   --   ALKPHOS 62  --   --   BILITOT 0.3  --   --    ------------------------------------------------------------------------------------------------------------------ No results for input(s): CHOL, HDL, LDLCALC, TRIG, CHOLHDL, LDLDIRECT in the last 72 hours.  Lab Results  Component Value Date   HGBA1C 5.2 12/27/2017   ------------------------------------------------------------------------------------------------------------------ No results for input(s): TSH, T4TOTAL, T3FREE, THYROIDAB in the last 72 hours.  Invalid input(s): FREET3 ------------------------------------------------------------------------------------------------------------------ No results for input(s): VITAMINB12, FOLATE, FERRITIN, TIBC, IRON, RETICCTPCT in the last 72 hours.  Coagulation profile No results for input(s): INR, PROTIME in the last 168 hours.  No results for input(s): DDIMER in the last 72 hours.  Cardiac Enzymes No results for input(s): CKMB, TROPONINI, MYOGLOBIN in the last 168 hours.  Invalid input(s): CK ------------------------------------------------------------------------------------------------------------------ No results found for: BNP   Roxan Hockey M.D on 08/31/2019 at 10:08 AM  Go to [email or web address] - for contact info  [facility] - Office  [phone]

## 2019-08-31 NOTE — Progress Notes (Signed)
Discussed with nursing staff.  Continues to ask for pain meds on a regular basis.  Diet has not been advanced beyond full liquids.  Hemoglobin remained in the 7 range through yesterday; today's value pending.  No bleeding observed in past 24 hours.  Covid negative -3/26. Diet will go back to clear liquids at 1500 today in anticipation of EGD with propofol tomorrow. Urine drug screen will be obtained tomorrow morning.

## 2019-09-01 ENCOUNTER — Inpatient Hospital Stay (HOSPITAL_COMMUNITY): Payer: Self-pay | Admitting: Anesthesiology

## 2019-09-01 ENCOUNTER — Inpatient Hospital Stay (HOSPITAL_COMMUNITY): Payer: Self-pay

## 2019-09-01 ENCOUNTER — Encounter (HOSPITAL_COMMUNITY): Payer: Self-pay | Admitting: Internal Medicine

## 2019-09-01 ENCOUNTER — Encounter (HOSPITAL_COMMUNITY)
Admission: EM | Disposition: A | Discharge status: Home / Self Care | Payer: Self-pay | Source: Home / Self Care | Attending: Family Medicine

## 2019-09-01 DIAGNOSIS — K921 Melena: Secondary | ICD-10-CM

## 2019-09-01 HISTORY — PX: ESOPHAGOGASTRODUODENOSCOPY (EGD) WITH PROPOFOL: SHX5813

## 2019-09-01 HISTORY — PX: BIOPSY: SHX5522

## 2019-09-01 LAB — BASIC METABOLIC PANEL
Anion gap: 9 (ref 5–15)
BUN: 8 mg/dL (ref 6–20)
CO2: 27 mmol/L (ref 22–32)
Calcium: 8.9 mg/dL (ref 8.9–10.3)
Chloride: 106 mmol/L (ref 98–111)
Creatinine, Ser: 0.99 mg/dL (ref 0.61–1.24)
GFR calc Af Amer: >60 mL/min (ref >60)
GFR calc non Af Amer: >60 mL/min (ref >60)
Glucose, Bld: 97 mg/dL (ref 70–99)
Potassium: 3.8 mmol/L (ref 3.5–5.1)
Sodium: 142 mmol/L (ref 135–145)

## 2019-09-01 LAB — GLUCOSE, CAPILLARY
Glucose-Capillary: 125 mg/dL — ABNORMAL HIGH (ref 70–99)
Glucose-Capillary: 81 mg/dL (ref 70–99)
Glucose-Capillary: 97 mg/dL (ref 70–99)

## 2019-09-01 LAB — RAPID URINE DRUG SCREEN, HOSP PERFORMED
Amphetamines: POSITIVE — AB
Barbiturates: POSITIVE — AB
Benzodiazepines: NOT DETECTED
Cocaine: NOT DETECTED
Opiates: POSITIVE — AB
Tetrahydrocannabinol: NOT DETECTED

## 2019-09-01 LAB — CBC
HCT: 24.1 % — ABNORMAL LOW (ref 39.0–52.0)
Hemoglobin: 7 g/dL — ABNORMAL LOW (ref 13.0–17.0)
MCH: 20.7 pg — ABNORMAL LOW (ref 26.0–34.0)
MCHC: 29 g/dL — ABNORMAL LOW (ref 30.0–36.0)
MCV: 71.3 fL — ABNORMAL LOW (ref 80.0–100.0)
Platelets: 834 10*3/uL — ABNORMAL HIGH (ref 150–400)
RBC: 3.38 MIL/uL — ABNORMAL LOW (ref 4.22–5.81)
RDW: 23.6 % — ABNORMAL HIGH (ref 11.5–15.5)
WBC: 7 10*3/uL (ref 4.0–10.5)
nRBC: 0 % (ref 0.0–0.2)

## 2019-09-01 SURGERY — ESOPHAGOGASTRODUODENOSCOPY (EGD) WITH PROPOFOL
Anesthesia: General

## 2019-09-01 SURGERY — ESOPHAGOGASTRODUODENOSCOPY (EGD) WITH PROPOFOL
Anesthesia: Monitor Anesthesia Care

## 2019-09-01 MED ORDER — LACTATED RINGERS IV SOLN: INTRAVENOUS | Status: DC | PRN

## 2019-09-01 MED ORDER — KETAMINE HCL 10 MG/ML IJ SOLN
INTRAMUSCULAR | Status: DC | PRN
  Administered 2019-09-01: 20 mg via INTRAVENOUS
  Administered 2019-09-01: 30 mg via INTRAVENOUS

## 2019-09-01 MED ORDER — FENTANYL CITRATE (PF) 100 MCG/2ML IJ SOLN
50.0000 ug | INTRAMUSCULAR | Status: DC | PRN
  Administered 2019-09-01: 50 ug via INTRAVENOUS

## 2019-09-01 MED ORDER — KETAMINE HCL 50 MG/5ML IJ SOSY
PREFILLED_SYRINGE | INTRAMUSCULAR | Status: AC
  Filled 2019-09-01: qty 5

## 2019-09-01 MED ORDER — GLYCOPYRROLATE 0.2 MG/ML IJ SOLN
INTRAMUSCULAR | Status: DC | PRN
  Administered 2019-09-01: .1 mg via INTRAVENOUS

## 2019-09-01 MED ORDER — ONDANSETRON HCL 4 MG/2ML IJ SOLN
4.0000 mg | Freq: Once | INTRAMUSCULAR | Status: AC
  Administered 2019-09-01: 4 mg via INTRAVENOUS

## 2019-09-01 MED ORDER — FENTANYL CITRATE (PF) 100 MCG/2ML IJ SOLN
50.0000 ug | Freq: Once | INTRAMUSCULAR | Status: AC
  Administered 2019-09-01: 50 ug via INTRAVENOUS

## 2019-09-01 MED ORDER — IOHEXOL 9 MG/ML PO SOLN
ORAL | Status: AC
  Administered 2019-09-01: 500 mL
  Filled 2019-09-01: qty 1000

## 2019-09-01 MED ORDER — FENTANYL CITRATE (PF) 100 MCG/2ML IJ SOLN
INTRAMUSCULAR | Status: AC
  Filled 2019-09-01: qty 2

## 2019-09-01 MED ORDER — PROMETHAZINE HCL 25 MG/ML IJ SOLN
25.0000 mg | Freq: Once | INTRAMUSCULAR | Status: AC
  Administered 2019-09-01: 25 mg via INTRAVENOUS
  Filled 2019-09-01: qty 1

## 2019-09-01 MED ORDER — PROPOFOL 10 MG/ML IV BOLUS
INTRAVENOUS | Status: AC
  Filled 2019-09-01: qty 20

## 2019-09-01 MED ORDER — SUCRALFATE 1 GM/10ML PO SUSP
1.0000 g | Freq: Three times a day (TID) | ORAL | Status: DC
  Administered 2019-09-01 – 2019-09-02 (×4): 1 g via ORAL
  Filled 2019-09-01 (×4): qty 10

## 2019-09-01 MED ORDER — PROPOFOL 500 MG/50ML IV EMUL
INTRAVENOUS | Status: DC | PRN
  Administered 2019-09-01: 150 ug/kg/min via INTRAVENOUS

## 2019-09-01 MED ORDER — IOHEXOL 300 MG/ML  SOLN
100.0000 mL | Freq: Once | INTRAMUSCULAR | Status: AC | PRN
  Administered 2019-09-01: 100 mL via INTRAVENOUS

## 2019-09-01 MED ORDER — BOOST / RESOURCE BREEZE PO LIQD CUSTOM
1.0000 | Freq: Three times a day (TID) | ORAL | Status: DC
  Administered 2019-09-02: 1 via ORAL

## 2019-09-01 MED ORDER — PROPOFOL 10 MG/ML IV BOLUS
INTRAVENOUS | Status: DC | PRN
  Administered 2019-09-01: 20 mg via INTRAVENOUS

## 2019-09-01 NOTE — Anesthesia Postprocedure Evaluation (Signed)
Anesthesia Post Note  Patient: David Miranda  Procedure(s) Performed: ESOPHAGOGASTRODUODENOSCOPY (EGD) WITH PROPOFOL (N/A ) BIOPSY  Patient location during evaluation: PACU Anesthesia Type: General Level of consciousness: awake Pain management: pain level controlled Vital Signs Assessment: post-procedure vital signs reviewed and stable Respiratory status: spontaneous breathing Cardiovascular status: stable Postop Assessment: no apparent nausea or vomiting Anesthetic complications: no     Last Vitals:  Vitals:   09/01/19 0639 09/01/19 1420  BP: 93/60 101/61  Pulse: (!) 50 (!) 53  Resp: 20 20  Temp: 36.7 C 36.7 C  SpO2: 98% 99%    Last Pain:  Vitals:   09/01/19 1507  TempSrc:   PainSc: [place]

## 2019-09-01 NOTE — Progress Notes (Signed)
      Subjective: Patient is NPO. Requesting pain medication. Urine drug screen needed for this morning. Urine specimen provided. Notes intermittent epigastric pain. Feels that when IV medications were decreased to oral that his pain was not controlled. Mild nausea, no vomiting. No melena. States when putting his fingers in between his ribs bilaterally that his pain improves. Aware of EGD planned for today pending urine drug screen.    Objective: Vital signs in last 24 hours: Temp:  [98.1 F (36.7 C)-98.6 F (37 C)] 98.1 F (36.7 C) (03/29 0639) Pulse Rate:  [50-85] 50 (03/29 0639) Resp:  [20] 20 (03/29 0639) BP: (93-116)/(60-71) 93/60 (03/29 0639) SpO2:  [97 %-100 %] 98 % (03/29 0639) Weight:  [66.5 kg] 66.5 kg (03/29 0639) Last BM Date: 08/30/19 General:   Alert and oriented, flat affect Head:  Normocephalic and atraumatic. Abdomen:  Bowel sounds present, soft, no TTP when distracted but notes discomfort when focused on abdominal exam.  Neurologic:  Alert and  oriented x 4   Intake/Output from previous day: No intake/output data recorded. Intake/Output this shift: No intake/output data recorded.  Lab Results: Recent Labs    08/30/19 0625 08/30/19 2201 09/01/19 0507  WBC 7.6 7.5 7.0  HGB 7.4* 7.2* 7.0*  HCT 26.2* 24.4* 24.1*  PLT 762* 779* 834*   BMET Recent Labs    08/30/19 0625 09/01/19 0507  NA 139 142  K 4.1 3.8  CL 105 106  CO2 24 27  GLUCOSE 95 97  BUN 9 8  CREATININE 0.75 0.99  CALCIUM 9.2 8.9    Assessment: 34 year old male with history of polysubstance abuse, history of complicated pancreatitis s/p Roux-en-Y due to gastric outlet obstruction in past, presenting with acute on chronic abdominal pain, profound anemia, and melena in setting of NSAIDs. Received 2 units PRBCs this admission, with Hgb remaining in 7 range. No overt GI bleeding.  Due to positive drug screen for cocaine on admission, repeat drug screen ordered today with EGD tentatively  planned if drug screen negative. Urine specimen has been requested, and nursing staff is aware and preparing for lab. NPO at this time.   Constipation: continue Linzess 145 mcg daily.    Plan: Stat urine drug screen this morning Remain NPO EGD with Dr. Gala Romney planned for this afternoon. Discussed risks and benefits with patient in detail again, with stated understanding Continue Protonix BID Continue Linzess 145 mcg daily Continue pancreatic enzmyes   Annitta Needs, PhD, ANP-BC [facility]    LOS: 3 days    09/01/2019, 8:35 AM

## 2019-09-01 NOTE — Anesthesia Preprocedure Evaluation (Addendum)
Anesthesia Evaluation  Patient identified by MRN, date of birth, ID band Patient awake    Reviewed: Allergy & Precautions, NPO status , Patient's Chart, lab work & pertinent test results  History of Anesthesia Complications Negative for: history of anesthetic complications  Airway Mallampati: III  TM Distance: >3 FB Neck ROM: Full    Dental  (+) Chipped, Dental Advisory Given,    Pulmonary Current Smoker and Patient abstained from smoking.,    Pulmonary exam normal breath sounds clear to auscultation       Cardiovascular hypertension, Pt. on medications Normal cardiovascular exam Rhythm:Regular Rate:Normal  01-Sep-2019 10:10:02 [facility]-AP-300 ROUTINE RECORD Sinus bradycardia ST elevation, consider early repolarization Borderline ECG   Neuro/Psych PSYCHIATRIC DISORDERS negative neurological ROS     GI/Hepatic GERD  Medicated,(+)     substance abuse  alcohol use, cocaine use, marijuana use and methamphetamine use,   Endo/Other    Renal/GU Renal disease     Musculoskeletal   Abdominal   Peds  Hematology  (+) anemia ,   Anesthesia Other Findings   Reproductive/Obstetrics                            Anesthesia Physical Anesthesia Plan  ASA: III  Anesthesia Plan: General   Post-op Pain Management:    Induction: Intravenous  PONV Risk Score and Plan:   Airway Management Planned: Nasal Cannula and Natural Airway  Additional Equipment:   Intra-op Plan:   Post-operative Plan:   Informed Consent: I have reviewed the patients History and Physical, chart, labs and discussed the procedure including the risks, benefits and alternatives for the proposed anesthesia with the patient or authorized representative who has indicated his/her understanding and acceptance.     Dental advisory given  Plan Discussed with: CRNA and Surgeon  Anesthesia Plan Comments:         Anesthesia Quick Evaluation                                  Anesthesia Evaluation  Patient identified by MRN, date of birth, ID band Patient awake    Reviewed: Allergy & Precautions, NPO status , Patient's Chart, lab work & pertinent test results  Airway Mallampati: III  TM Distance: >3 FB Neck ROM: Full    Dental  (+) Dental Advisory Given, Missing   Pulmonary Current Smoker and Patient abstained from smoking.,           Cardiovascular Exercise Tolerance: Good hypertension,      Neuro/Psych PSYCHIATRIC DISORDERS negative neurological ROS     GI/Hepatic GERD  Medicated and Poorly Controlled,(+)     substance abuse  alcohol use, cocaine use and marijuana use,   Endo/Other  negative endocrine ROS  Renal/GU Renal InsufficiencyRenal disease     Musculoskeletal   Abdominal   Peds  Hematology  (+) anemia ,   Anesthesia Other Findings Active Problems:   Acute alcoholic pancreatitis   Alcohol abuse   Tobacco abuse   Cannabis abuse   Esophagitis   Pancreatic pseudocyst/cyst--alcoholic pancreatitis   GI bleed   Abdominal pain   Gastric outlet obstruction   Reproductive/Obstetrics                             Anesthesia Physical Anesthesia Plan  ASA: IV  Anesthesia Plan: General   Post-op Pain  Management:    Induction: Intravenous  PONV Risk Score and Plan: Ondansetron and Dexamethasone  Airway Management Planned: Oral ETT  Additional Equipment:   Intra-op Plan:   Post-operative Plan:   Informed Consent: I have reviewed the patients History and Physical, chart, labs and discussed the procedure including the risks, benefits and alternatives for the proposed anesthesia with the patient or authorized representative who has indicated his/her understanding and acceptance.     Dental advisory given  Plan Discussed with: CRNA  Anesthesia Plan Comments:         Anesthesia Quick  Evaluation

## 2019-09-01 NOTE — Progress Notes (Signed)
Patient Demographics:    David Miranda, is a 34 y.o. male, DOB - [birth year], [id number]  Admit date - 08/29/2019   Admitting Physician Lynetta Mare, MD  Outpatient Primary MD for the patient is Patient, No Pcp Per  LOS - 3   Chief Complaint  Patient presents with  . Abdominal Pain    no BM for a week        Subjective:    David Miranda today has no fevers, no emesis,  No chest pain,  --Status post EGD, patient unhappy wants to eat -Needs to drink contrast for CT abdomen to rule out ischemic bowel   Assessment  & Plan :    Principal Problem:   GI bleed Active Problems:   Alcohol abuse   Constipation   Essential hypertension   Tobacco abuse   GERD (gastroesophageal reflux disease)   Microcytic anemia   Polysubstance abuse (HCC)   Excessive use of nonsteroidal anti-inflammatory drug (NSAID)   Brief Summary 34 y.o.malewith medical history significant ofalcohol abuse, chronic anemia, chronic alcoholic pancreatitis, recent gastric outlet obstruction status post gastrojejunostomy for decompression on 04/04/2019, with subsequent revision surgery and  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, as well as polysubstance abuse including methamphetamine, cocaine THC and heroin readmitted on 08/29/2019 with symptomatic anemia with hemoglobin of 5.8 in the setting of NSAID and polysubstance abuse -Prior EGD in October 2020 with esophagitis and gastritis -Repeat EGD on 09/01/2019 for peptic ulcer disease with severe erosive esophagitis as well as  and pre and post anastomotic ulcers   A/p 1)Acute on chronic symptomatic microcytic anemia secondary to GI blood loss-the patient with alcoholic esophagitis and gastritis as well as ongoing NSAIDs use---- --acute blood loss anemia superimposed on chronic anemia --hemoglobin up to 7.2 after transfusion of 2 units  of PRBC--- -stool occult blood negative -GI consult appreciated, -Anesthesia not comfortable with sedation and EGD at this time due to UDS being positive for cocaine and amphetamines -Monitor H&H and transfuse as needed -Continue IV Protonix -EGD on 09/01/2019 as noted above and below with peptic ulcer and erosive esophagitis--- awaiting CT abdomen with contrast to rule out ischemic component   2)SevereAlcoholicEsophagitis andGastritis --- s/p EGD on 04/01/2019 with erosive esophagitis with bleeding-patient remains noncompliant with PPI due to polysubstance abuse --Continue IV PPI  3)Chronic AlcoholicPancreatitis with history of pseudocyst-- as needed Zofran ---Continue Protonix ---c/n Creon even with liquid diet ---Patient is advised to have abdominal imaging surveillance of his pancreatic pseudocyst every 6 months for the next 2 years to confirm stability  4)History of alcoholAbuse--- -lorazepam per CIWA protocol, multivitamin as ordered --Outpatient alcohol rehab program advised  5)Thrombocytosis--suspect this is reactive, hydrate,  6)Polysubstance abuse--- UDS positive for Cocaine, Amphetamines, THC and opiates  --- Patient continues to decline outpatient drug rehab --c/n Phenobarbital Detox protocol --Methocarbamol, hydroxyzine and gabapentin and trazodone as ordered to help blunt withdrawal symptoms  7)Nicotine Dependence----c/n  nicotine patch  Disposition/Need for in-Hospital Stay- patient unable to be discharged at this time due to --- acute symptomatic anemia due to GI blood loss--requiring transfusion and further monitoring especially given bradycardia --Per GI patient patient requires abdominal imaging to rule out ischemic bowel --Not medically ready for discharge -Anticipate discharge home in  1 to 2 days Code Status : Full  Family Communication:   NA (patient is alert, awake and coherent)  Consults  :  Gi   Procedure- EGD 09/01/19 Severe erosive reflux  esophagitis. Surgically altered stomach with gastrojejunostomy. Large anastomotic ulcers -immediately pre and post anastomotic.   DVT Prophylaxis  :  SCDs ///GI bleed  Lab Results  Component Value Date   PLT 834 (H) 09/01/2019    Inpatient Medications  Scheduled Meds: . feeding supplement  1 Container Oral TID BM  . folic acid  1 mg Oral Daily  . gabapentin  300 mg Oral TID  . hydrOXYzine  25 mg Oral TID  . linaclotide  145 mcg Oral QAC breakfast  . lipase/protease/amylase  72,000 Units Oral TID AC  . methocarbamol  750 mg Oral QID  . multivitamin with minerals  1 tablet Oral Daily  . nicotine  14 mg Transdermal Daily  . pantoprazole (PROTONIX) IV  40 mg Intravenous Q12H  . phenobarbital  16.2 mg Oral Q12H  . sodium chloride flush  3 mL Intravenous Q12H  . sucralfate  1 g Oral TID WC & HS  . thiamine  100 mg Oral Daily   Or  . thiamine  100 mg Intravenous Daily  . traZODone  150 mg Oral QHS   Continuous Infusions: . sodium chloride    . lactated ringers 75 mL/hr at 09/01/19 1711   PRN Meds:.sodium chloride, acetaminophen **OR** acetaminophen, alum & mag hydroxide-simeth, bisacodyl, HYDROmorphone (DILAUDID) injection, ondansetron **OR** ondansetron (ZOFRAN) IV, oxyCODONE, polyethylene glycol, sodium chloride flush    Anti-infectives (From admission, onward)   None        Objective:   Vitals:   09/01/19 1551 09/01/19 1600 09/01/19 1615 09/01/19 1620  BP: (!) 89/59 (!) 94/59 113/81   Pulse: 70 65 87 93  Resp: 13 13 (!) 28 (!) 21  Temp: (!) 97.4 F (36.3 C)     TempSrc:      SpO2: 98% 97% 100% 98%  Weight:        Wt Readings from Last 3 Encounters:  09/01/19 66.5 kg  07/16/19 70.3 kg  05/23/19 68 kg     Intake/Output Summary (Last 24 hours) at 09/01/2019 1839 Last data filed at 09/01/2019 1800 Gross per 24 hour  Intake 2056 ml  Output 450 ml  Net 1606 ml     Physical Exam  Gen:- Awake Alert,  In no apparent distress  HEENT:- [place].AT, No sclera  icterus Neck-Supple Neck,No JVD,.  Lungs-  CTAB , fair symmetrical air movement CV- S1, S2 normal, regular ,  Abd-  +ve B.Sounds, Abd Soft, epigastric and periumbilical tenderness, no rebound or guarding Extremity/Skin:- No  edema, pedal pulses present  Psych-affect is appropriate, oriented x3 Neuro-no new focal deficits, no tremors   Data Review:   Micro Results Recent Results (from the past 240 hour(s))  Respiratory Panel by RT PCR (Flu A&B, Covid) - Nasopharyngeal Swab     Status: None   Collection Time: 08/29/19  4:55 AM   Specimen: Nasopharyngeal Swab  Result Value Ref Range Status   SARS Coronavirus 2 by RT PCR NEGATIVE NEGATIVE Final    Comment: (NOTE) SARS-CoV-2 target nucleic acids are NOT DETECTED. The SARS-CoV-2 RNA is generally detectable in upper respiratoy specimens during the acute phase of infection. The lowest concentration of SARS-CoV-2 viral copies this assay can detect is 131 copies/mL. A negative result does not preclude SARS-Cov-2 infection and should not be used as the sole  basis for treatment or other patient management decisions. A negative result may occur with  improper specimen collection/handling, submission of specimen other than nasopharyngeal swab, presence of viral mutation(s) within the areas targeted by this assay, and inadequate number of viral copies (<131 copies/mL). A negative result must be combined with clinical observations, patient history, and epidemiological information. The expected result is Negative. Fact Sheet for Patients:  PinkCheek.be Fact Sheet for Healthcare Providers:  GravelBags.it This test is not yet ap proved or cleared by the [place] FDA and  has been authorized for detection and/or diagnosis of SARS-CoV-2 by FDA under an Emergency Use Authorization (EUA). This EUA will remain  in effect (meaning this test can be used) for the duration of the COVID-19  declaration under Section 564(b)(1) of the Act, 21 U.S.C. section 360bbb-3(b)(1), unless the authorization is terminated or revoked sooner.    Influenza A by PCR NEGATIVE NEGATIVE Final   Influenza B by PCR NEGATIVE NEGATIVE Final    Comment: (NOTE) The Xpert Xpress SARS-CoV-2/FLU/RSV assay is intended as an aid in  the diagnosis of influenza from Nasopharyngeal swab specimens and  should not be used as a sole basis for treatment. Nasal washings and  aspirates are unacceptable for Xpert Xpress SARS-CoV-2/FLU/RSV  testing. Fact Sheet for Patients: PinkCheek.be Fact Sheet for Healthcare Providers: GravelBags.it This test is not yet approved or cleared by the [place] FDA and  has been authorized for detection and/or diagnosis of SARS-CoV-2 by  FDA under an Emergency Use Authorization (EUA). This EUA will remain  in effect (meaning this test can be used) for the duration of the  Covid-19 declaration under Section 564(b)(1) of the Act, 21  U.S.C. section 360bbb-3(b)(1), unless the authorization is  terminated or revoked. Performed at [facility], [address], [place], [place] [address]     Radiology Reports DG ABD ACUTE 2+V W 1V CHEST  Result Date: 08/29/2019 CLINICAL DATA:  Abdominal pain EXAM: DG ABDOMEN ACUTE W/ 1V CHEST COMPARISON:  None. FINDINGS: There is no evidence of dilated bowel loops or free intraperitoneal air. Moderate amount of colonic stool. No radiopaque calculi or other significant radiographic abnormality is seen. Heart size and mediastinal contours are within normal limits. Both lungs are clear. IMPRESSION: Moderate amount of colonic stool. Nonobstructive bowel gas pattern. No acute cardiopulmonary disease. Electronically Signed   By: Prudencio Pair M.D.   On: 08/29/2019 04:36     CBC Recent Labs  Lab 08/29/19 0407 08/29/19 0407 08/29/19 0933 08/29/19 0933 08/29/19 1411 08/29/19 2204 08/30/19 0625  08/30/19 2201 09/01/19 0507  WBC 13.6*   < > 4.3  --   --  9.1 7.6 7.5 7.0  HGB 5.8*   < > 12.9*   < > 7.2* 7.0* 7.4* 7.2* 7.0*  HCT 20.3*   < > 39.4   < > 25.2* 23.7* 26.2* 24.4* 24.1*  PLT 805*   < > 131*  --   --  600* 762* 779* 834*  MCV 65.9*   < > 90.8  --   --  72.7* 72.2* 72.2* 71.3*  MCH 18.8*   < > 29.7  --   --  21.5* 20.4* 21.3* 20.7*  MCHC 28.6*   < > 32.7  --   --  29.5* 28.2* 29.5* 29.0*  RDW 20.5*   < > 12.2  --   --  23.1* 23.3* 23.6* 23.6*  LYMPHSABS 2.8  --   --   --   --   --   --   --   --  MONOABS 1.0  --   --   --   --   --   --   --   --   EOSABS 0.3  --   --   --   --   --   --   --   --   BASOSABS 0.1  --   --   --   --   --   --   --   --    < > = values in this interval not displayed.    Chemistries  Recent Labs  Lab 08/29/19 0407 08/29/19 1411 08/30/19 0625 09/01/19 0507  NA 139  --  139 142  K 3.8  --  4.1 3.8  CL 103  --  105 106  CO2 26  --  24 27  GLUCOSE 127*  --  95 97  BUN 19  --  9 8  CREATININE 0.83  --  0.75 0.99  CALCIUM 9.3  --  9.2 8.9  MG  --  2.1  --   --   AST 12*  --   --   --   ALT 10  --   --   --   ALKPHOS 62  --   --   --   BILITOT 0.3  --   --   --    ------------------------------------------------------------------------------------------------------------------ No results for input(s): CHOL, HDL, LDLCALC, TRIG, CHOLHDL, LDLDIRECT in the last 72 hours.  Lab Results  Component Value Date   HGBA1C 5.2 12/27/2017   ------------------------------------------------------------------------------------------------------------------ No results for input(s): TSH, T4TOTAL, T3FREE, THYROIDAB in the last 72 hours.  Invalid input(s): FREET3 ------------------------------------------------------------------------------------------------------------------ No results for input(s): VITAMINB12, FOLATE, FERRITIN, TIBC, IRON, RETICCTPCT in the last 72 hours.  Coagulation profile No results for input(s): INR, PROTIME in the last  168 hours.  No results for input(s): DDIMER in the last 72 hours.  Cardiac Enzymes No results for input(s): CKMB, TROPONINI, MYOGLOBIN in the last 168 hours.  Invalid input(s): CK ------------------------------------------------------------------------------------------------------------------ No results found for: BNP   Roxan Hockey M.D on 09/01/2019 at 6:39 PM  Go to [email or web address] - for contact info  [facility] - Office  [phone]

## 2019-09-01 NOTE — Transfer of Care (Signed)
Immediate Anesthesia Transfer of Care Note  Patient: David Miranda  Procedure(s) Performed: ESOPHAGOGASTRODUODENOSCOPY (EGD) WITH PROPOFOL (N/A ) BIOPSY  Patient Location: PACU  Anesthesia Type:General  Level of Consciousness: awake  Airway & Oxygen Therapy: Patient Spontanous Breathing  Post-op Assessment: Report given to RN and Post -op Vital signs reviewed and stable  Post vital signs: Reviewed and stable  Last Vitals:  Vitals Value Taken Time  BP    Temp    Pulse    Resp    SpO2      Last Pain:  Vitals:   09/01/19 1507  TempSrc:   PainSc: 7       Patients Stated Pain Goal: 6 ([phone] [id number])  Complications: No apparent anesthesia complications

## 2019-09-01 NOTE — Progress Notes (Signed)
Awake. Oriented to place per nurse. Wants something to eat and drink. Informed that he could not have anything to eat.or drink. Informed pt that he will have a CT scan in 2 hours and depending on the results, will determine of when he will eat and drink. Pt was very upset about this information. He started yelling and cursing. Wanted to go home. "I am not staying here with nothing to eat or drink."  Reassurance given. Continues cursing. Pt uncooperative and cursing. Transferred to w/c. Transferred to rm 310. Transferred to bed per self. SR up x3. Asking for nurse for pain medication. [place] notified.

## 2019-09-01 NOTE — Progress Notes (Addendum)
Urine drug screen completed. Negative for cocaine.    Patient seen and examined in short stay.  Agree with need for diagnostic EGD.  Of offer the patient a diagnostic EGD today.  The risks, benefits, limitations, alternatives and imponderables have been reviewed with the patient. Potential for esophageal dilation, biopsy, etc. have also been reviewed.  Questions have been answered. All parties agreeable.  Further recommendations to follow.

## 2019-09-01 NOTE — Op Note (Signed)
[facility] Patient Name: David Miranda Procedure Date: 09/01/2019 3:13 PM MRN: [id number] Date of Birth: 25-Feb-1986 Attending MD: Gennette Pac , MD CSN: [id number] Age: 34 Admit Type: Inpatient Procedure:                Upper GI endoscopy Indications:              Melena Providers:                Gennette Pac, MD, [place] Scrape RN,                            RN, Pandora Leiter, Technician Referring MD:              Medicines:                Propofol per Anesthesia Complications:            No immediate complications. Estimated Blood Loss:     Estimated blood loss was minimal. Procedure:                After obtaining informed consent, the endoscope was                            passed under direct vision. Throughout the                            procedure, the patient's blood pressure, pulse, and                            oxygen saturations were monitored continuously. The                            [id number]-[id number] ([id number]) scope was introduced through the                            mouth, and advanced to the second part of duodenum. Scope In: 3:35:33 PM Scope Out: 3:44:34 PM Total Procedure Duration: 0 hours 9 minutes 1 second  Findings:      "Geographic" erosive esophagitis involving the distal 10 cm of tubular       esophagus. Some overlying exudate but no pseudomembranes. Tubular       esophagus patent throughout its course. Surgically altered stomach.       Minimal fluid in the stomach easily suctioned out. Evidence of recent       gastro jejunostomy identified. Patent anastomosis at this level. Patient       had approximately 2 x 2 cm area of deep ulceration on the gastric side       anastomosis and a second 2 x 2 centimeter area of ulceration just on the       small bowel side. Deep craters - friable but no stigmata recent       bleeding. Patent jejunal limb. Pulled back into the stomach and looked       at the remainder of the gastric mucosa -  pylorus appeared intact and       patent easily intubated duodenal bulb was dilated. Duodenum appeared to       be extrinsically compressed at the junction of D1 and D2 I - I was able  to fairly easily negotiate the scope through this area where the more       distal duodenum opened up and appeared entirely normal. Please see       photos.      I elected to biopsy the gastric mucosa for H. pylori. The ulcers were       not biopsied. Impression:               -Severe erosive reflux esophagitis. Surgically                            altered stomach with gastrojejunostomy. Large                            anastomotic ulcers -immediately pre and post                            anastomotic. Query NSAID effect +/- ischemia                            related to recent surgery. Patent pylorus.                            Extrinsically compressed proximal duodenum -was                            able to pass the scope through narrowing.                           Although patient has respectible ulcer disease, any                            pain from which should be much improved now unless                            he has a significant ischemic component (or                            something else going on). He has been on high-dose                            acid suppression therapy along with no NSAIDs for                            the past 4 days now. Moderate Sedation:      Moderate (conscious) sedation was personally administered by an       anesthesia professional. The following parameters were monitored: oxygen       saturation, heart rate, blood pressure, respiratory rate, EKG, adequacy       of pulmonary ventilation, and response to care. Recommendation:           - Return patient to hospital ward for ongoing care.                            Proceed with a contrast CT of the abdomen pelvis to  make sure there is nothing else going on coexistent                             with ulcer disease. If CT is negative for new                            findings, I would recommend we a advance his diet                            and focus on pain management (which may be a                            challenge)                           Continue Protonix 40 mg IV every 12 hours. Clear                            liquid diet. Add Carafate suspension 1 g before                            meals and at bedtime. Follow-up gastric biopsies                            for H. pylori. Procedure Code(s):        --- Professional ---                           [phone], Esophagogastroduodenoscopy, flexible,                            transoral; diagnostic, including collection of                            specimen(s) by brushing or washing, when performed                            (separate procedure) Diagnosis Code(s):        --- Professional ---                           K92.1, Melena (includes Hematochezia) CPT copyright 2019 American Medical Association. All rights reserved. The codes documented in this report are preliminary and upon coder review may  be revised to meet current compliance requirements. Gerrit Friends. Sindhu Nguyen, MD Gennette Pac, MD 09/01/2019 4:17:31 PM This report has been signed electronically. Number of Addenda: 0

## 2019-09-01 NOTE — Progress Notes (Signed)
Patient requesting food.  Notified  Dr. Laural Golden.  Read results of CT scan to MD.  Received order to change patient to heart healthy diet.

## 2019-09-02 LAB — CBC
HCT: 24.9 % — ABNORMAL LOW (ref 39.0–52.0)
Hemoglobin: 7 g/dL — ABNORMAL LOW (ref 13.0–17.0)
MCH: 20.2 pg — ABNORMAL LOW (ref 26.0–34.0)
MCHC: 28.1 g/dL — ABNORMAL LOW (ref 30.0–36.0)
MCV: 71.8 fL — ABNORMAL LOW (ref 80.0–100.0)
Platelets: 890 10*3/uL — ABNORMAL HIGH (ref 150–400)
RBC: 3.47 MIL/uL — ABNORMAL LOW (ref 4.22–5.81)
RDW: 23.9 % — ABNORMAL HIGH (ref 11.5–15.5)
WBC: 6.1 10*3/uL (ref 4.0–10.5)
nRBC: 0 % (ref 0.0–0.2)

## 2019-09-02 LAB — GLUCOSE, CAPILLARY
Glucose-Capillary: 190 mg/dL — ABNORMAL HIGH (ref 70–99)
Glucose-Capillary: 97 mg/dL (ref 70–99)

## 2019-09-02 MED ORDER — BISACODYL 10 MG RE SUPP
10.0000 mg | Freq: Once | RECTAL | Status: AC
  Administered 2019-09-02: 10 mg via RECTAL
  Filled 2019-09-02: qty 1

## 2019-09-02 MED ORDER — PANCRELIPASE (LIP-PROT-AMYL) 36000-114000 UNITS PO CPEP: 72000.0000 [IU] | ORAL_CAPSULE | Freq: Three times a day (TID) | ORAL | 3 refills | Status: AC

## 2019-09-02 MED ORDER — HYDROXYZINE HCL 25 MG PO TABS: 25.0000 mg | ORAL_TABLET | Freq: Two times a day (BID) | ORAL | 0 refills | Status: AC

## 2019-09-02 MED ORDER — LINACLOTIDE 145 MCG PO CAPS: 145.0000 ug | ORAL_CAPSULE | Freq: Every day | ORAL | 2 refills | Status: AC

## 2019-09-02 MED ORDER — CALCIUM CARBONATE ANTACID 500 MG PO CHEW: 2.0000 | CHEWABLE_TABLET | Freq: Every day | ORAL | 1 refills | Status: DC | PRN

## 2019-09-02 MED ORDER — ADULT MULTIVITAMIN W/MINERALS CH: 1.0000 | ORAL_TABLET | Freq: Every day | ORAL | 5 refills | Status: DC

## 2019-09-02 MED ORDER — SUCRALFATE 1 GM/10ML PO SUSP: 1.0000 g | Freq: Three times a day (TID) | ORAL | 0 refills | Status: DC

## 2019-09-02 MED ORDER — LACTULOSE 10 GM/15ML PO SOLN
60.0000 g | Freq: Once | ORAL | Status: AC
  Administered 2019-09-02: 60 g via ORAL
  Filled 2019-09-02: qty 90

## 2019-09-02 MED ORDER — FERROUS SULFATE 325 (65 FE) MG PO TBEC: 325.0000 mg | DELAYED_RELEASE_TABLET | Freq: Two times a day (BID) | ORAL | 3 refills | Status: AC

## 2019-09-02 MED ORDER — OMEPRAZOLE 40 MG PO CPDR: 40.0000 mg | DELAYED_RELEASE_CAPSULE | Freq: Two times a day (BID) | ORAL | 5 refills | Status: AC

## 2019-09-02 MED ORDER — SENNOSIDES-DOCUSATE SODIUM 8.6-50 MG PO TABS: 2.0000 | ORAL_TABLET | Freq: Every day | ORAL | 3 refills | Status: AC

## 2019-09-02 MED ORDER — TRAZODONE HCL 150 MG PO TABS: 150.0000 mg | ORAL_TABLET | Freq: Every day | ORAL | 2 refills | Status: AC

## 2019-09-02 MED ORDER — NICOTINE 14 MG/24HR TD PT24: 14.0000 mg | MEDICATED_PATCH | Freq: Every day | TRANSDERMAL | 0 refills | Status: DC

## 2019-09-02 MED ORDER — PANTOPRAZOLE SODIUM 40 MG PO TBEC: 40.0000 mg | DELAYED_RELEASE_TABLET | Freq: Two times a day (BID) | ORAL | Status: DC

## 2019-09-02 MED ORDER — POLYETHYLENE GLYCOL 3350 17 G PO PACK: 17.0000 g | PACK | Freq: Every day | ORAL | 3 refills | Status: DC

## 2019-09-02 MED ORDER — ACETAMINOPHEN 325 MG PO TABS: 650.0000 mg | ORAL_TABLET | Freq: Four times a day (QID) | ORAL | 0 refills | Status: DC | PRN

## 2019-09-02 NOTE — Discharge Instructions (Signed)
1)Avoid Alka -setlzer, ibuprofen/Advil/Aleve/Motrin/Goody Powders/Naproxen/BC powders/Meloxicam/Diclofenac/Indomethacin and other Nonsteroidal anti-inflammatory medications as these will make you more likely to bleed and can cause stomach ulcers, can also cause Kidney problems.   2)Omeprazole 40 mg twice daily to help your stomach/Bowel Ulcers Heal  3)You Need Repeat CBC (Complete Blood Count Test) in 1 week  4)Panctreatic Pseudocyst---- Patient is advised to have abdominal imaging surveillance of his pancreatic pseudocyst every 6 months for the next 2 years to confirm stability  ---5)Smoking cessation advised, consider nicotine patch  6)Abstinence from Cocaine, Methamphetamine, Illicit/Street drugs and Opiates advised---

## 2019-09-02 NOTE — Progress Notes (Signed)
Patient has had no nausea or vomiting this shift.

## 2019-09-02 NOTE — TOC Transition Note (Signed)
Transition of Care [facility]) - CM/SW Discharge Note   Patient Details  Name: Arne Massengill MRN: [id number] Date of Birth: [birth year]  [facility]) CM/SW Contact:  Kinisha Soper, Chauncey Reading, RN Phone Number: 09/02/2019, 11:50 AM   Clinical Narrative:   Patient discharging today. MATCH provided for medication assistance, override completed by supervisor.  Call to [facility], patient has missed last 2 appointments scheduled by [facility] team.  [facility] flyer provided to patient to schedule an appointment.     Final next level of care: Home/Self Care Barriers to Discharge: Barriers Resolved        Discharge Plan and Services   Discharge Planning Services: CM Consult, Medication Assistance                                 Social Determinants of Health (SDOH) Interventions     Readmission Risk Interventions Readmission Risk Prevention Plan 04/14/2019 04/08/2019  Transportation Screening Complete Complete  PCP or Specialist Appt within 5-7 Days - Complete  Home Care Screening - Complete  Medication Review (RN CM) - Complete  Palliative Care Screening Not Applicable -  Medication Review (RN Care Manager) Complete -  Some recent data might be hidden

## 2019-09-02 NOTE — Discharge Summary (Signed)
David Miranda, is a 34 y.o. male  DOB Nov 30, 1985  MRN [id number].  Admission date:  08/29/2019  Admitting Physician  Lynetta Mare, MD  Discharge Date:  09/02/2019   Primary MD  Patient, No Pcp Per  Recommendations for primary care physician for things to follow:   1)Avoid Alka -setlzer, ibuprofen/Advil/Aleve/Motrin/Goody Powders/Naproxen/BC powders/Meloxicam/Diclofenac/Indomethacin and other Nonsteroidal anti-inflammatory medications as these will make you more likely to bleed and can cause stomach ulcers, can also cause Kidney problems.   2)Omeprazole 40 mg twice daily to help your stomach/Bowel Ulcers Heal  3)You Need Repeat CBC (Complete Blood Count Test) in 1 week  4)Panctreatic Pseudocyst---- Patient is advised to have abdominal imaging surveillance of his pancreatic pseudocyst every 6 months for the next 2 years to confirm stability  ---5)Smoking cessation advised, consider nicotine patch  6)Abstinence from Cocaine, Methamphetamine, Illicit/Street drugs and Opiates advised---  Admission Diagnosis  Microcytic anemia [D50.9] GI bleed [K92.2] Thrombocytosis (HCC) [D47.3] Intractable abdominal pain [R10.9]   Discharge Diagnosis  Microcytic anemia [D50.9] GI bleed [K92.2] Thrombocytosis ([place]) [D47.3] Intractable abdominal pain [R10.9]    Principal Problem:   GI bleed Active Problems:   Alcohol abuse   Constipation   Essential hypertension   Tobacco abuse   GERD (gastroesophageal reflux disease)   Microcytic anemia   Polysubstance abuse (HCC)   Excessive use of nonsteroidal anti-inflammatory drug (NSAID)      Past Medical History:  Diagnosis Date  . GERD (gastroesophageal reflux disease)   . Pancreatitis     Past Surgical History:  Procedure Laterality Date  . BIOPSY  09/01/2019   Procedure: BIOPSY;  Surgeon: Daneil Dolin, MD;  [facility];  Service: Endoscopy;;   Gastric Biopsy for H. Pylori   . ESOPHAGOGASTRODUODENOSCOPY (EGD) WITH PROPOFOL N/A 04/01/2019   Procedure: ESOPHAGOGASTRODUODENOSCOPY (EGD) WITH PROPOFOL;  Surgeon: Danie Binder, MD;  [facility]: [facility];  Service: Endoscopy;  Laterality: N/A;  . ESOPHAGOGASTRODUODENOSCOPY (EGD) WITH PROPOFOL N/A 09/01/2019   Procedure: ESOPHAGOGASTRODUODENOSCOPY (EGD) WITH PROPOFOL;  Surgeon: Daneil Dolin, MD;  [facility]: [facility];  Service: Endoscopy;  Laterality: N/A;  . GASTROJEJUNOSTOMY N/A 04/04/2019   Procedure: GASTROJEJUNOSTOMY;  Surgeon: Virl Cagey, MD;  Location: [facility];  Service: General;  Laterality: N/A;  . GASTROJEJUNOSTOMY  04/14/2019   Procedure: REVISION GASTROJEJUNOSTOMY TO ROUX-EN-Y;  Surgeon: Virl Cagey, MD;  Location: [facility];  Service: General;;  . NO PAST SURGERIES    . TESTICLE SURGERY       HPI  from the history and physical done on the day of admission:    HPI: David Miranda is a 34 y.o. 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  Patient states he has been feeling constipated and has not had a good bowel movement in about a week.  Has been feeling bloated.  Reports passing 2 small hard bowel movements yesterday.  Also has some epigastric burning.  Denies any recent use of alcohol or recreational drugs.  Urine drug screen in February 2021 was positive for amphetamines, cocaine, THC.  Alcohol level is negative today.  No fever, chills, shortness of breath, palpitations, dysuria, seizures, syncope, dizziness, lightheadedness, vomiting, diarrhea. ED Course:  Vital Signs reviewed on presentation, significant for temperature 97.8, heart rate 60, blood pressure 107/62, saturation 100% on room air. Labs reviewed, significant for sodium 139, potassium  3.8, BUN 19, creatinine 0.8, LFTs within normal limits, WBC count 13.6, hemoglobin 5.8, hematocrit 20, MCV 65, platelets 805.  Fecal occult blood is actually negative but no stool was present in the vault. Imaging personally Reviewed, abdominal x-ray shows moderate amount of colonic stool.  Nonobstructive bowel gas pattern.  No acute cardiopulmonary disease.    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, as well as polysubstance abuse including methamphetamine, cocaine THC and heroin readmitted on 08/29/2019 with symptomatic anemia with hemoglobin of 5.8 in the setting of NSAID and polysubstance abuse -Prior EGD in October 2020 with esophagitis and gastritis -Repeat EGD on 09/01/2019 for peptic ulcer disease with severe erosive esophagitis as well as  and pre and post anastomotic ulcers   A/p 1)Acute on chronic symptomatic microcytic anemia secondary to GI blood loss-the patient with alcoholic esophagitis and gastritis as well as ongoing NSAIDs use---- --acute blood loss anemia superimposed on chronic anemia --hemoglobin is around 7 after transfusion of 2 units of PRBC--- -stool occult blood negative -GI consult appreciated, -Anesthesia was not comfortable with sedation and EGD at this time due to UDS being positive for cocaine and amphetamines, repeat UDS on 09/01/2018 well with without cocaine so EGD was performed -Treated with IV Protonix okay to discharge on p.o. omeprazole 40 twice daily -EGD on 09/01/2019 as noted above and below with peptic ulcer and erosive esophagitis--- CT abdomen with contrast without evidence of significant ischemia   2)SevereAlcoholicEsophagitis  andGastritis --- s/p EGD on 04/01/2019 with erosive esophagitis with bleeding-patient remains noncompliant with PPI due to polysubstance abuse -Avoidance of NSAIDs, --PPI as above  3)Chronic AlcoholicPancreatitis with history of pseudocyst-- as needed Zofran ---Continue PPI  ---c/n Creon ---Patient is advised to have abdominal imaging surveillance of his pancreatic pseudocyst every 6 months for the next 2 years to confirm stability  4)History of alcoholAbuse--- -multivitamin as ordered --Outpatient alcohol rehab program advised  5)Thrombocytosis--suspect this is reactive, hydrate,  6)Polysubstance abuse--- UDS positive for Cocaine, Amphetamines, THC and opiates  --- Patient continues to decline outpatient drug rehab -Treated with phenobarbital Detox protocol --Treated with methocarbamol, hydroxyzine and gabapentin and trazodone as ordered to help blunt withdrawal symptoms  7)Nicotine Dependence----c/n  nicotine patch  Disposition--discharge home please see discharge instructions Code Status : Full  Family Communication:   NA (patient is alert, awake and coherent)  Consults  :  Gi   Procedure- EGD 09/01/19 Severe erosive reflux esophagitis. Surgically altered stomach with gastrojejunostomy. Large anastomotic ulcers -immediately pre and post anastomotic.   Discharge Condition: Stable  Follow UP  Follow-up Information    Rourk, Cristopher Estimable, MD. Schedule an appointment as soon as possible for a visit in 1 week(s).   Specialty: Gastroenterology Contact information: [address] [place] [address] [phone]  Diet and Activity recommendation:  As advised  Discharge Instructions    Discharge Instructions    Call MD for:  difficulty breathing, headache or visual disturbances   Complete by: As directed    Call MD for:  persistant dizziness or light-headedness   Complete by: As directed    Call MD for:  persistant nausea and vomiting    Complete by: As directed    Call MD for:  temperature >100.4   Complete by: As directed    Diet - low sodium heart healthy   Complete by: As directed    Discharge instructions   Complete by: As directed    1)Avoid Alka -setlzer, ibuprofen/Advil/Aleve/Motrin/Goody Powders/Naproxen/BC powders/Meloxicam/Diclofenac/Indomethacin and other Nonsteroidal anti-inflammatory medications as these will make you more likely to bleed and can cause stomach ulcers, can also cause Kidney problems.   2)Omeprazole 40 mg twice daily to help your stomach/Bowel Ulcers Heal  3)You Need Repeat CBC (Complete Blood Count Test) in 1 week  4)Panctreatic Pseudocyst---- Patient is advised to have abdominal imaging surveillance of his pancreatic pseudocyst every 6 months for the next 2 years to confirm stability  ---5)Smoking cessation advised, consider nicotine patch  6)Abstinence from Cocaine, Methamphetamine, Illicit/Street drugs and Opiates advised---   Increase activity slowly   Complete by: As directed         Discharge Medications     Allergies as of 09/02/2019   No Known Allergies     Medication List    STOP taking these medications   aspirin-sod bicarb-citric acid 325 MG Tbef tablet Commonly known as: ALKA-SELTZER   dicyclomine 20 MG tablet Commonly known as: Bentyl   HYDROcodone-acetaminophen 5-325 MG tablet Commonly known as: Norco   ibuprofen 200 MG tablet Commonly known as: ADVIL   methocarbamol 500 MG tablet Commonly known as: ROBAXIN     TAKE these medications   acetaminophen 325 MG tablet Commonly known as: TYLENOL Take 2 tablets (650 mg total) by mouth every 6 (six) hours as needed for mild pain (or Fever >/= 101). What changed:   when to take this  reasons to take this   calcium carbonate 500 MG chewable tablet Commonly known as: TUMS - dosed in mg elemental calcium Chew 2 tablets (400 mg of elemental calcium total) by mouth daily as needed for indigestion or  heartburn. What changed: how much to take   ferrous sulfate 325 (65 FE) MG EC tablet Take 1 tablet (325 mg total) by mouth 2 (two) times daily with a meal.   hydrOXYzine 25 MG tablet Commonly known as: ATARAX/VISTARIL Take 1 tablet (25 mg total) by mouth 2 (two) times daily.   linaclotide 145 MCG Caps capsule Commonly known as: LINZESS Take 1 capsule (145 mcg total) by mouth daily before breakfast. Start taking on: September 03, 2019   lipase/protease/amylase 36000 UNITS Cpep capsule Commonly known as: CREON Take 2 capsules (72,000 Units total) by mouth 3 (three) times daily with meals.   multivitamin with minerals Tabs tablet Take 1 tablet by mouth daily.   nicotine 14 mg/24hr patch Commonly known as: NICODERM CQ - dosed in mg/24 hours Place 1 patch (14 mg total) onto the skin daily. Start taking on: September 03, 2019   omeprazole 40 MG capsule Commonly known as: PRILOSEC Take 1 capsule (40 mg total) by mouth 2 (two) times daily.   polyethylene glycol 17 g packet Commonly known as: MIRALAX / GLYCOLAX Take 17 g by mouth daily.   senna-docusate 8.6-50 MG tablet Commonly known as:  Senokot-S Take 2 tablets by mouth at bedtime.   sucralfate 1 GM/10ML suspension Commonly known as: CARAFATE Take 10 mLs (1 g total) by mouth 4 (four) times daily -  with meals and at bedtime. What changed: when to take this   traZODone 150 MG tablet Commonly known as: DESYREL Take 1 tablet (150 mg total) by mouth at bedtime. What changed:   medication strength  how much to take       Major procedures and Radiology Reports - PLEASE review detailed and final reports for all details, in brief -   CT ABDOMEN PELVIS W CONTRAST  Result Date: 09/01/2019 CLINICAL DATA:  Generalized abdominal pain for 2 weeks EXAM: CT ABDOMEN AND PELVIS WITH CONTRAST TECHNIQUE: Multidetector CT imaging of the abdomen and pelvis was performed using the standard protocol following bolus administration of intravenous  contrast. CONTRAST:  15mL OMNIPAQUE IOHEXOL 300 MG/ML  SOLN COMPARISON:  07/16/2019 CT, 08/29/2019 plain film FINDINGS: Lower chest: No acute abnormality. Hepatobiliary: No focal liver abnormality is seen. No gallstones, gallbladder wall thickening, or biliary dilatation. Pancreas: Cystic structures again noted near the junction of the head and body of the pancreas measuring 1.6 cm in greatest dimension. This is decreased in size from the prior exam at which time it measured 1.8 cm in greatest dimension. No new cystic changes are seen. No focal mass lesion is noted. Spleen: Normal in size without focal abnormality. Adrenals/Urinary Tract: Adrenal glands are within normal limits. Kidneys demonstrate a normal enhancement pattern bilaterally. Scattered cysts are seen in the left kidney. No renal calculi or obstructive changes are noted. The bladder is well distended. Stomach/Bowel: Colon demonstrates fecal material without obstructive change. The overall appearance is similar to that seen on the recent plain film examination. The appendix is unremarkable. No obstructive or inflammatory changes of the small bowel are seen. Small sliding-type hiatal hernia is noted. Postsurgical changes consistent with gastrojejunostomy are seen. Mild inflammatory changes are again identified surrounding these postoperative changes. Vascular/Lymphatic: No significant vascular findings are present. No enlarged abdominal or pelvic lymph nodes. Reproductive: Prostate is unremarkable. Other: No abdominal wall hernia or abnormality. No abdominopelvic ascites. Musculoskeletal: No acute or significant osseous findings. IMPRESSION: Changes of prior gastrojejunostomy. Inflammatory changes are noted surrounding the gastrojejunostomy similar to that seen on the prior exam. Recent endoscopies earlier today shows ulcerations in this area which correspond to the inflammatory change. No definitive perforation is noted. Resolving cystic structure  within the body of the pancreas. No other acute abnormality is seen. Electronically Signed   By: Inez Catalina M.D.   On: 09/01/2019 20:10   DG ABD ACUTE 2+V W 1V CHEST  Result Date: 08/29/2019 CLINICAL DATA:  Abdominal pain EXAM: DG ABDOMEN ACUTE W/ 1V CHEST COMPARISON:  None. FINDINGS: There is no evidence of dilated bowel loops or free intraperitoneal air. Moderate amount of colonic stool. No radiopaque calculi or other significant radiographic abnormality is seen. Heart size and mediastinal contours are within normal limits. Both lungs are clear. IMPRESSION: Moderate amount of colonic stool. Nonobstructive bowel gas pattern. No acute cardiopulmonary disease. Electronically Signed   By: Prudencio Pair M.D.   On: 08/29/2019 04:36    Micro Results   Recent Results (from the past 240 hour(s))  Respiratory Panel by RT PCR (Flu A&B, Covid) - Nasopharyngeal Swab     Status: None   Collection Time: 08/29/19  4:55 AM   Specimen: Nasopharyngeal Swab  Result Value Ref Range Status   SARS Coronavirus 2 by RT  PCR NEGATIVE NEGATIVE Final    Comment: (NOTE) SARS-CoV-2 target nucleic acids are NOT DETECTED. The SARS-CoV-2 RNA is generally detectable in upper respiratoy specimens during the acute phase of infection. The lowest concentration of SARS-CoV-2 viral copies this assay can detect is 131 copies/mL. A negative result does not preclude SARS-Cov-2 infection and should not be used as the sole basis for treatment or other patient management decisions. A negative result may occur with  improper specimen collection/handling, submission of specimen other than nasopharyngeal swab, presence of viral mutation(s) within the areas targeted by this assay, and inadequate number of viral copies (<131 copies/mL). A negative result must be combined with clinical observations, patient history, and epidemiological information. The expected result is Negative. Fact Sheet for Patients:    PinkCheek.be Fact Sheet for Healthcare Providers:  GravelBags.it This test is not yet ap proved or cleared by the [place] FDA and  has been authorized for detection and/or diagnosis of SARS-CoV-2 by FDA under an Emergency Use Authorization (EUA). This EUA will remain  in effect (meaning this test can be used) for the duration of the COVID-19 declaration under Section 564(b)(1) of the Act, 21 U.S.C. section 360bbb-3(b)(1), unless the authorization is terminated or revoked sooner.    Influenza A by PCR NEGATIVE NEGATIVE Final   Influenza B by PCR NEGATIVE NEGATIVE Final    Comment: (NOTE) The Xpert Xpress SARS-CoV-2/FLU/RSV assay is intended as an aid in  the diagnosis of influenza from Nasopharyngeal swab specimens and  should not be used as a sole basis for treatment. Nasal washings and  aspirates are unacceptable for Xpert Xpress SARS-CoV-2/FLU/RSV  testing. Fact Sheet for Patients: PinkCheek.be Fact Sheet for Healthcare Providers: GravelBags.it This test is not yet approved or cleared by the [place] FDA and  has been authorized for detection and/or diagnosis of SARS-CoV-2 by  FDA under an Emergency Use Authorization (EUA). This EUA will remain  in effect (meaning this test can be used) for the duration of the  Covid-19 declaration under Section 564(b)(1) of the Act, 21  U.S.C. section 360bbb-3(b)(1), unless the authorization is  terminated or revoked. Performed at [facility], [address] [place], [place] [address]        Today   Subjective    David Miranda today has no no new concerns -Eating and drinking well No fever  Or chills  No nausea, vomiting, diarrhea           Patient has been seen and examined prior to discharge   Objective   Blood pressure 104/67, pulse (!) 56, temperature 98.2 F (36.8 C), temperature source Oral, resp.  rate 20, weight 66.5 kg, SpO2 97 %.   Intake/Output Summary (Last 24 hours) at 09/02/2019 1446 Last data filed at 09/02/2019 1300 Gross per 24 hour  Intake 3286 ml  Output --  Net 3286 ml    Exam Gen:- Awake Alert, no acute distress  HEENT:- [place].AT, No sclera icterus Neck-Supple Neck,No JVD,.  Lungs-  CTAB , good air movement bilaterally  CV- S1, S2 normal, regular Abd-  +ve B.Sounds, Abd Soft, improving epigastric and periumbilical tenderness,   no rebound or guarding Extremity/Skin:- No  edema,   good pulses Psych-affect is appropriate, oriented x3 Neuro-no new focal deficits, no tremors    Data Review   CBC w Diff:  Lab Results  Component Value Date   WBC 6.1 09/02/2019   HGB 7.0 (L) 09/02/2019   HCT 24.9 (L) 09/02/2019   PLT 890 (H) 09/02/2019  LYMPHOPCT 21 08/29/2019   MONOPCT 8 08/29/2019   EOSPCT 2 08/29/2019   BASOPCT 1 08/29/2019    CMP:  Lab Results  Component Value Date   NA 142 09/01/2019   K 3.8 09/01/2019   CL 106 09/01/2019   CO2 27 09/01/2019   BUN 8 09/01/2019   CREATININE 0.99 09/01/2019   PROT 7.4 08/29/2019   ALBUMIN 3.7 08/29/2019   BILITOT 0.3 08/29/2019   ALKPHOS 62 08/29/2019   AST 12 (L) 08/29/2019   ALT 10 08/29/2019  .   Total Discharge time is about 33 minutes  Roxan Hockey M.D on 09/02/2019 at 2:46 PM  Go to [email or web address] -  for contact info  [facility] - Office  [phone]

## 2019-09-03 ENCOUNTER — Other Ambulatory Visit: Payer: Self-pay

## 2019-09-03 ENCOUNTER — Encounter: Payer: Self-pay | Admitting: Internal Medicine

## 2019-09-03 LAB — SURGICAL PATHOLOGY

## 2019-09-05 LAB — GLUCOSE, CAPILLARY: Glucose-Capillary: <10 mg/dL — CL (ref 70–99)

## 2019-09-06 ENCOUNTER — Encounter: Payer: Self-pay | Admitting: Internal Medicine

## 2019-09-25 ENCOUNTER — Emergency Department (HOSPITAL_COMMUNITY)
Admission: EM | Admit: 2019-09-25 | Discharge: 2019-09-25 | Discharge status: Against Medical Advice | Payer: Self-pay | Attending: Emergency Medicine | Admitting: Emergency Medicine

## 2019-09-25 ENCOUNTER — Other Ambulatory Visit: Payer: Self-pay

## 2019-09-25 ENCOUNTER — Encounter (HOSPITAL_COMMUNITY): Payer: Self-pay | Admitting: Emergency Medicine

## 2019-09-25 DIAGNOSIS — I10 Essential (primary) hypertension: Secondary | ICD-10-CM | POA: Insufficient documentation

## 2019-09-25 DIAGNOSIS — R109 Unspecified abdominal pain: Secondary | ICD-10-CM | POA: Insufficient documentation

## 2019-09-25 DIAGNOSIS — F17210 Nicotine dependence, cigarettes, uncomplicated: Secondary | ICD-10-CM | POA: Insufficient documentation

## 2019-09-25 DIAGNOSIS — Z79899 Other long term (current) drug therapy: Secondary | ICD-10-CM | POA: Insufficient documentation

## 2019-09-25 LAB — ETHANOL: Alcohol, Ethyl (B): <10 mg/dL

## 2019-09-25 LAB — COMPREHENSIVE METABOLIC PANEL
ALT: 11 U/L (ref 0–44)
AST: 13 U/L — ABNORMAL LOW (ref 15–41)
Albumin: 3.7 g/dL (ref 3.5–5.0)
Alkaline Phosphatase: 72 U/L (ref 38–126)
Anion gap: 8 (ref 5–15)
BUN: 18 mg/dL (ref 6–20)
CO2: 25 mmol/L (ref 22–32)
Calcium: 8.8 mg/dL — ABNORMAL LOW (ref 8.9–10.3)
Chloride: 105 mmol/L (ref 98–111)
Creatinine, Ser: 0.68 mg/dL (ref 0.61–1.24)
GFR calc Af Amer: >60 mL/min (ref >60)
GFR calc non Af Amer: >60 mL/min (ref >60)
Glucose, Bld: 127 mg/dL — ABNORMAL HIGH (ref 70–99)
Potassium: 3.4 mmol/L — ABNORMAL LOW (ref 3.5–5.1)
Sodium: 138 mmol/L (ref 135–145)
Total Bilirubin: <0.1 mg/dL — ABNORMAL LOW (ref 0.3–1.2)
Total Protein: 7.1 g/dL (ref 6.5–8.1)

## 2019-09-25 LAB — CBC WITH DIFFERENTIAL/PLATELET
Abs Immature Granulocytes: 0.11 10*3/uL — ABNORMAL HIGH (ref 0.00–0.07)
Basophils Absolute: 0.2 10*3/uL — ABNORMAL HIGH (ref 0.0–0.1)
Basophils Relative: 2 %
Eosinophils Absolute: 0.3 10*3/uL (ref 0.0–0.5)
Eosinophils Relative: 3 %
HCT: 35.7 % — ABNORMAL LOW (ref 39.0–52.0)
Hemoglobin: 11.2 g/dL — ABNORMAL LOW (ref 13.0–17.0)
Immature Granulocytes: 1 %
Lymphocytes Relative: 22 %
Lymphs Abs: 2.8 10*3/uL (ref 0.7–4.0)
MCH: 24.4 pg — ABNORMAL LOW (ref 26.0–34.0)
MCHC: 31.4 g/dL (ref 30.0–36.0)
MCV: 77.8 fL — ABNORMAL LOW (ref 80.0–100.0)
Monocytes Absolute: 0.8 10*3/uL (ref 0.1–1.0)
Monocytes Relative: 7 %
Neutro Abs: 8.5 10*3/uL — ABNORMAL HIGH (ref 1.7–7.7)
Neutrophils Relative %: 65 %
Platelets: 824 10*3/uL — ABNORMAL HIGH (ref 150–400)
RBC: 4.59 MIL/uL (ref 4.22–5.81)
RDW: 26.6 % — ABNORMAL HIGH (ref 11.5–15.5)
WBC: 12.7 10*3/uL — ABNORMAL HIGH (ref 4.0–10.5)
nRBC: 0 % (ref 0.0–0.2)

## 2019-09-25 LAB — LIPASE, BLOOD: Lipase: 52 U/L — ABNORMAL HIGH (ref 11–51)

## 2019-09-25 NOTE — ED Provider Notes (Signed)
[facility] Provider Note   CSN: [id number] Arrival date & time: 09/25/19  [id number]     History Chief Complaint  Patient presents with  . Abdominal Pain    David Miranda is a 34 y.o. male.  HPI Patient states he had surgery on April 6 at [facility] for leaking after surgery.  He states he was seen there again on April 17 and he has 2 wristband showing that date and was admitted.  He states he was discharged 2 days ago.  He states he had gone to [facility] the day before he was admitted at [facility] and was told he had a infection that needed to be drained and he should go back to the original place where he had a surgery.  He states he has had abdominal pain for the past week.  He states it is underneath his rib cage on both sides.  He states he has had nausea but no vomiting, he does complain of acid reflux.  He denies fever or diarrhea and states he is constipated.  He states his last bowel movement was 2 days ago and it was hard "like diarrhea".  He states they gave him some laxatives when he was in [place] for constipation.  He returns tonight for ongoing abdominal pain.  Patient states his last cocaine was a week ago.  PCP Patient, No Pcp Per     Past Medical History:  Diagnosis Date  . GERD (gastroesophageal reflux disease)   . Pancreatitis     Patient Active Problem List   Diagnosis Date Noted  . Microcytic anemia   . Polysubstance abuse ([place])   . Excessive use of nonsteroidal anti-inflammatory drug (NSAID)   . Intractable abdominal pain 05/16/2019  . SBO (small bowel obstruction) ([place])   . Peritonitis ([place]) 04/13/2019  . Gastric outlet obstruction   . Abdominal pain   . GI bleed 03/31/2019  . Emesis, persistent 03/30/2019  . Esophagitis 03/30/2019  . Pancreatic pseudocyst/cyst--alcoholic pancreatitis [id number]  . Acute necrotizing pancreatitis 07/24/2018  . Cannabis abuse 03/27/2018  . Acute kidney injury ([place])   . Hypomagnesemia   . Acute on chronic  pancreatitis ([place]) 03/26/2018  . GERD (gastroesophageal reflux disease) 03/26/2018  . Acute renal failure (ARF) ([place]) 03/26/2018  . Hypokalemia 02/27/2018  . Essential hypertension 02/27/2018  . Tobacco abuse 02/27/2018  . Acute pancreatitis 02/27/2018  . Constipation   . Annular pancreas   . Pancreatitis   . Non-intractable vomiting with nausea   . Hepatic steatosis   . 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 Leukocytosis 12/27/2017  . Abdominal pain, epigastric 12/27/2017  . Acute alcoholic pancreatitis 0000000  . Alcohol abuse 12/26/2017    Past Surgical History:  Procedure Laterality Date  . BIOPSY  09/01/2019   Procedure: BIOPSY;  Surgeon: Daneil Dolin, MD;  Location: [facility];  Service: Endoscopy;;  Gastric Biopsy for H. Pylori   . ESOPHAGOGASTRODUODENOSCOPY (EGD) WITH PROPOFOL N/A 04/01/2019   Procedure: ESOPHAGOGASTRODUODENOSCOPY (EGD) WITH PROPOFOL;  Surgeon: Danie Binder, MD;  Location: [facility];  Service: Endoscopy;  Laterality: N/A;  . ESOPHAGOGASTRODUODENOSCOPY (EGD) WITH PROPOFOL N/A 09/01/2019   Procedure: ESOPHAGOGASTRODUODENOSCOPY (EGD) WITH PROPOFOL;  Surgeon: Daneil Dolin, MD;  Location: [facility];  Service: Endoscopy;  Laterality: N/A;  . GASTROJEJUNOSTOMY N/A 04/04/2019   Procedure: GASTROJEJUNOSTOMY;  Surgeon: Virl Cagey, MD;  Location: [facility] ORS;  Service: General;  Laterality: N/A;  . GASTROJEJUNOSTOMY  04/14/2019   Procedure: REVISION GASTROJEJUNOSTOMY TO ROUX-EN-Y;  Surgeon: Virl Cagey, MD;  Location: [facility] ORS;  Service: General;;  . NO PAST SURGERIES    . TESTICLE SURGERY         Family History  Problem Relation Age of Onset  . Cancer Other     Social History   Tobacco Use  . Smoking status: Current Every Day Smoker    Packs/day: 1.00    Types: Cigarettes  . Smokeless tobacco: Never Used  Substance Use Topics  . Alcohol use: Not Currently    Comment: pint every few days  . Drug use: Yes    Types:  Marijuana, Cocaine    Comment: occasionally    Home Medications Prior to Admission medications   Medication Sig Start Date End Date Taking? Authorizing Provider  acetaminophen (TYLENOL) 325 MG tablet Take 2 tablets (650 mg total) by mouth every 6 (six) hours as needed for mild pain (or Fever >/= 101). 09/02/19   Roxan Hockey, MD  calcium carbonate (TUMS - DOSED IN MG ELEMENTAL CALCIUM) 500 MG chewable tablet Chew 2 tablets (400 mg of elemental calcium total) by mouth daily as needed for indigestion or heartburn. 09/02/19   Roxan Hockey, MD  ferrous sulfate 325 (65 FE) MG EC tablet Take 1 tablet (325 mg total) by mouth 2 (two) times daily with a meal. 09/02/19   Emokpae, Courage, MD  hydrOXYzine (ATARAX/VISTARIL) 25 MG tablet Take 1 tablet (25 mg total) by mouth 2 (two) times daily. 09/02/19   Roxan Hockey, MD  linaclotide Rolan Lipa) 145 MCG CAPS capsule Take 1 capsule (145 mcg total) by mouth daily before breakfast. 09/03/19   Roxan Hockey, MD  lipase/protease/amylase (CREON) 36000 UNITS CPEP capsule Take 2 capsules (72,000 Units total) by mouth 3 (three) times daily with meals. 09/02/19   Roxan Hockey, MD  Multiple Vitamin (MULTIVITAMIN WITH MINERALS) TABS tablet Take 1 tablet by mouth daily. 09/02/19   Roxan Hockey, MD  nicotine (NICODERM CQ - DOSED IN MG/24 HOURS) 14 mg/24hr patch Place 1 patch (14 mg total) onto the skin daily. 09/03/19   Roxan Hockey, MD  omeprazole (PRILOSEC) 40 MG capsule Take 1 capsule (40 mg total) by mouth 2 (two) times daily. 09/02/19   Roxan Hockey, MD  polyethylene glycol (MIRALAX / GLYCOLAX) 17 g packet Take 17 g by mouth daily. 09/02/19   Emokpae, Courage, MD  senna-docusate (SENOKOT-S) 8.6-50 MG tablet Take 2 tablets by mouth at bedtime. 09/02/19 09/01/20  Roxan Hockey, MD  sucralfate (CARAFATE) 1 GM/10ML suspension Take 10 mLs (1 g total) by mouth 4 (four) times daily -  with meals and at bedtime. 09/02/19   Roxan Hockey, MD  traZODone  (DESYREL) 150 MG tablet Take 1 tablet (150 mg total) by mouth at bedtime. 09/02/19   Roxan Hockey, MD    Allergies    Patient has no known allergies.  Review of Systems   Review of Systems  All other systems reviewed and are negative.   Physical Exam Updated Vital Signs BP 134/72   Pulse 77   Temp 98.5 F (36.9 C) (Oral)   Resp 17   Ht 6' (1.829 m)   Wt 70.3 kg   SpO2 100%   BMI 21.02 kg/m   Physical Exam Vitals and nursing note reviewed.  Constitutional:      Appearance: Normal appearance. He is normal weight.  HENT:     Head: Normocephalic and atraumatic.     Right Ear: External ear normal.     Left Ear: External ear normal.  Mouth/Throat:     Mouth: Mucous membranes are dry.  Eyes:     Extraocular Movements: Extraocular movements intact.     Conjunctiva/sclera: Conjunctivae normal.     Pupils: Pupils are equal, round, and reactive to light.  Cardiovascular:     Rate and Rhythm: Normal rate and regular rhythm.     Pulses: Normal pulses.  Pulmonary:     Effort: Pulmonary effort is normal. No respiratory distress.     Breath sounds: Normal breath sounds.  Abdominal:     Tenderness: There is abdominal tenderness.       Comments: Patient states the pain is worst on the left.  Musculoskeletal:        General: Normal range of motion.     Cervical back: Normal range of motion.  Skin:    General: Skin is warm and dry.     Comments: Multiple tattoos  Neurological:     General: No focal deficit present.     Mental Status: He is alert and oriented to person, place, and time.     Cranial Nerves: No cranial nerve deficit.  Psychiatric:        Mood and Affect: Mood is anxious.        Speech: Speech is rapid and pressured.        Behavior: Behavior is agitated.     ED Results / Procedures / Treatments   Labs (all labs ordered are listed, but only abnormal results are displayed) Results for orders placed or performed during the hospital encounter of  09/25/19  Comprehensive metabolic panel  Result Value Ref Range   Sodium 138 135 - 145 mmol/L   Potassium 3.4 (L) 3.5 - 5.1 mmol/L   Chloride 105 98 - 111 mmol/L   CO2 25 22 - 32 mmol/L   Glucose, Bld 127 (H) 70 - 99 mg/dL   BUN 18 6 - 20 mg/dL   Creatinine, Ser 0.68 0.61 - 1.24 mg/dL   Calcium 8.8 (L) 8.9 - 10.3 mg/dL   Total Protein 7.1 6.5 - 8.1 g/dL   Albumin 3.7 3.5 - 5.0 g/dL   AST 13 (L) 15 - 41 U/L   ALT 11 0 - 44 U/L   Alkaline Phosphatase 72 38 - 126 U/L   Total Bilirubin <0.1 (L) 0.3 - 1.2 mg/dL   GFR calc non Af Amer >60 >60 mL/min   GFR calc Af Amer >60 >60 mL/min   Anion gap 8 5 - 15  Ethanol  Result Value Ref Range   Alcohol, Ethyl (B) <10 <10 mg/dL  CBC with Differential  Result Value Ref Range   WBC 12.7 (H) 4.0 - 10.5 K/uL   RBC 4.59 4.22 - 5.81 MIL/uL   Hemoglobin 11.2 (L) 13.0 - 17.0 g/dL   HCT 35.7 (L) 39.0 - 52.0 %   MCV 77.8 (L) 80.0 - 100.0 fL   MCH 24.4 (L) 26.0 - 34.0 pg   MCHC 31.4 30.0 - 36.0 g/dL   RDW 26.6 (H) 11.5 - 15.5 %   Platelets 824 (H) 150 - 400 K/uL   nRBC 0.0 0.0 - 0.2 %   Neutrophils Relative % 65 %   Neutro Abs 8.5 (H) 1.7 - 7.7 K/uL   Lymphocytes Relative 22 %   Lymphs Abs 2.8 0.7 - 4.0 K/uL   Monocytes Relative 7 %   Monocytes Absolute 0.8 0.1 - 1.0 K/uL   Eosinophils Relative 3 %   Eosinophils Absolute 0.3 0.0 - 0.5 K/uL  Basophils Relative 2 %   Basophils Absolute 0.2 (H) 0.0 - 0.1 K/uL   Immature Granulocytes 1 %   Abs Immature Granulocytes 0.11 (H) 0.00 - 0.07 K/uL  Lipase, blood  Result Value Ref Range   Lipase 52 (H) 11 - 51 U/L    Laboratory interpretation all normal except mild hypokalemia, and significant elevation of lipase, nonfasting hyperglycemia, mild anemia, leukocytosis   EKG None  Radiology No results found.   CT ABDOMEN PELVIS W CONTRAST  Result Date: 09/01/2019 CLINICAL DATA:  Generalized abdominal pain for 2 weeks  IMPRESSION: Changes of prior gastrojejunostomy. Inflammatory changes are  noted surrounding the gastrojejunostomy similar to that seen on the prior exam. Recent endoscopies earlier today shows ulcerations in this area which correspond to the inflammatory change. No definitive perforation is noted. Resolving cystic structure within the body of the pancreas. No other acute abnormality is seen. Electronically Signed   By: Inez Catalina M.D.   On: 09/01/2019 20:10   DG ABD ACUTE 2+V W 1V CHEST  Result Date: 08/29/2019 CLINICAL DATA:  Abdominal pain  IMPRESSION: Moderate amount of colonic stool. Nonobstructive bowel gas pattern. No acute cardiopulmonary disease. Electronically Signed   By: Prudencio Pair M.D.   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  Patient was seen in the emergency department at [facility] on April 15.  He had a CT scan showingRadiology   CT Abdomen Pelvis W IV and Oral Contrast  Final Result  -- Interval sequela of gastrojejunostomy and jejunojejunostomy with large enhancing fluid and gas containing collection along the anterior upper abdomen deep to  the surgical incision concerning for abscess. No definite evidence of contrast extravasation. Small volume pneumoperitoneum may be postsurgical. Recommend correlation with outside imaging.  -- 1.2 x 1.5 cm pancreatic body cystic lesion with additional subcentimeter hypodensity, query pseudocysts. No CT evidence of acute pancreatitis. This could be further characterized with nonemergent MRI/MRCP.  He had a surgical consult who recommended he be admitted and get IR drainage of the abscess.  However when the ED staff went back to talk to the patient he had eloped.    Medications Ordered in ED Medications - No data to display  ED Course  I have reviewed the triage vital signs and the nursing notes.  Pertinent labs & imaging results that were available during my care of the patient were reviewed by me and considered in my medical decision making (see chart for details).    MDM Rules/Calculators/A&P                      Laboratory testing was ordered.  I told the patient I needed a urine sample to check a UDS for drugs.  He  told me he had not done any in a week.  I reminded him that it does not stay in the system that long so if his screen tonight is positive that means he is lying to me.  I was going to give him IV fluids until we got the urine sample results.  3:45 AM nurse reports that patient wanted his IV removed and said he was going back to [facility].  Patient never provided a urine sample.  Final Clinical Impression(s) / ED Diagnoses Final diagnoses:  Abdominal pain, unspecified abdominal location    Rx / DC Orders   Pt eloped  Rolland Porter, MD, Barbette Or, MD 09/25/19 [phone]

## 2019-09-25 NOTE — ED Notes (Addendum)
Pt called RN to room stating that he wanted his IV out so he can leave. Pt was upset that he was required to provide a urine sample. Pt called for ride and left stating his [facility] was going to take him to [facility] to be seen for his pain .

## 2019-09-25 NOTE — ED Triage Notes (Signed)
Pt C/O abdominal pain that started 3-4 days ago. Pt reports "I was leaking from an incision from a surgery that yall did." "So I went to [facility] and they said I had an infection in my abdomen but they wouldn't do the surgery." Pt reporting that he then went to [facility] and was admitted "but then they turned me loose and told me nothing was wrong."

## 2019-10-27 MED ORDER — OXYCODONE HCL 5 MG PO TABS
5.00 | ORAL_TABLET | ORAL | Status: DC
Start: ? — End: 2019-10-27

## 2019-10-27 MED ORDER — NICOTINE 21 MG/24HR TD PT24
1.00 | MEDICATED_PATCH | TRANSDERMAL | Status: DC
Start: 2019-10-28 — End: 2019-10-27

## 2019-10-27 MED ORDER — HYDROMORPHONE HCL 1 MG/ML IJ SOLN
0.50 | INTRAMUSCULAR | Status: DC
Start: ? — End: 2019-10-27

## 2019-10-27 MED ORDER — LACTATED RINGERS IV SOLN
INTRAVENOUS | Status: DC
Start: ? — End: 2019-10-27

## 2019-10-27 MED ORDER — SUCRALFATE 1 GM/10ML PO SUSP
1.00 | ORAL | Status: DC
Start: 2019-10-27 — End: 2019-10-27

## 2019-10-27 MED ORDER — LIDOCAINE HCL 1 % IJ SOLN
0.50 | INTRAMUSCULAR | Status: DC
Start: ? — End: 2019-10-27

## 2019-10-27 MED ORDER — PANTOPRAZOLE SODIUM 40 MG IV SOLR
40.00 | INTRAVENOUS | Status: DC
Start: 2019-10-27 — End: 2019-10-27

## 2019-10-27 MED ORDER — GENERIC EXTERNAL MEDICATION
250.00 | Status: DC
Start: 2019-10-28 — End: 2019-10-27

## 2020-10-16 ENCOUNTER — Other Ambulatory Visit: Payer: Self-pay

## 2020-10-16 ENCOUNTER — Emergency Department (HOSPITAL_COMMUNITY)
Admission: EM | Admit: 2020-10-16 | Discharge: 2020-10-16 | Disposition: A | Discharge status: Home / Self Care | Payer: Self-pay | Attending: Emergency Medicine | Admitting: Emergency Medicine

## 2020-10-16 ENCOUNTER — Encounter (HOSPITAL_COMMUNITY): Payer: Self-pay

## 2020-10-16 ENCOUNTER — Emergency Department (HOSPITAL_COMMUNITY): Payer: Self-pay

## 2020-10-16 DIAGNOSIS — R1010 Upper abdominal pain, unspecified: Secondary | ICD-10-CM

## 2020-10-16 DIAGNOSIS — K2900 Acute gastritis without bleeding: Secondary | ICD-10-CM | POA: Insufficient documentation

## 2020-10-16 DIAGNOSIS — I10 Essential (primary) hypertension: Secondary | ICD-10-CM | POA: Insufficient documentation

## 2020-10-16 DIAGNOSIS — K219 Gastro-esophageal reflux disease without esophagitis: Secondary | ICD-10-CM | POA: Insufficient documentation

## 2020-10-16 DIAGNOSIS — F17210 Nicotine dependence, cigarettes, uncomplicated: Secondary | ICD-10-CM | POA: Insufficient documentation

## 2020-10-16 LAB — CBC WITH DIFFERENTIAL/PLATELET
Abs Immature Granulocytes: 0.02 10*3/uL (ref 0.00–0.07)
Basophils Absolute: 0.1 10*3/uL (ref 0.0–0.1)
Basophils Relative: 1 %
Eosinophils Absolute: 0.1 10*3/uL (ref 0.0–0.5)
Eosinophils Relative: 1 %
HCT: 43.5 % (ref 39.0–52.0)
Hemoglobin: 13.4 g/dL (ref 13.0–17.0)
Immature Granulocytes: 0 %
Lymphocytes Relative: 16 %
Lymphs Abs: 1.7 10*3/uL (ref 0.7–4.0)
MCH: 23 pg — ABNORMAL LOW (ref 26.0–34.0)
MCHC: 30.8 g/dL (ref 30.0–36.0)
MCV: 74.6 fL — ABNORMAL LOW (ref 80.0–100.0)
Monocytes Absolute: 1 10*3/uL (ref 0.1–1.0)
Monocytes Relative: 10 %
Neutro Abs: 7.6 10*3/uL (ref 1.7–7.7)
Neutrophils Relative %: 72 %
Platelets: 346 10*3/uL (ref 150–400)
RBC: 5.83 MIL/uL — ABNORMAL HIGH (ref 4.22–5.81)
RDW: 29.7 % — ABNORMAL HIGH (ref 11.5–15.5)
WBC: 10.5 10*3/uL (ref 4.0–10.5)
nRBC: 0 % (ref 0.0–0.2)

## 2020-10-16 LAB — COMPREHENSIVE METABOLIC PANEL
ALT: 22 U/L (ref 0–44)
AST: 19 U/L (ref 15–41)
Albumin: 4.2 g/dL (ref 3.5–5.0)
Alkaline Phosphatase: 89 U/L (ref 38–126)
Anion gap: 9 (ref 5–15)
BUN: 15 mg/dL (ref 6–20)
CO2: 26 mmol/L (ref 22–32)
Calcium: 10.3 mg/dL (ref 8.9–10.3)
Chloride: 103 mmol/L (ref 98–111)
Creatinine, Ser: 1.14 mg/dL (ref 0.61–1.24)
GFR, Estimated: >60 mL/min (ref >60)
Glucose, Bld: 136 mg/dL — ABNORMAL HIGH (ref 70–99)
Potassium: 4.2 mmol/L (ref 3.5–5.1)
Sodium: 138 mmol/L (ref 135–145)
Total Bilirubin: 0.3 mg/dL (ref 0.3–1.2)
Total Protein: 7.4 g/dL (ref 6.5–8.1)

## 2020-10-16 LAB — URINALYSIS, ROUTINE W REFLEX MICROSCOPIC
Bilirubin Urine: NEGATIVE
Glucose, UA: NEGATIVE mg/dL
Hgb urine dipstick: NEGATIVE
Ketones, ur: NEGATIVE mg/dL
Leukocytes,Ua: NEGATIVE
Nitrite: NEGATIVE
Protein, ur: NEGATIVE mg/dL
Specific Gravity, Urine: >1.046 — ABNORMAL HIGH (ref 1.005–1.030)
pH: 7 (ref 5.0–8.0)

## 2020-10-16 LAB — LIPASE, BLOOD: Lipase: 26 U/L (ref 11–51)

## 2020-10-16 MED ORDER — SODIUM CHLORIDE 0.9 % IV BOLUS
1000.0000 mL | Freq: Once | INTRAVENOUS | Status: AC
  Administered 2020-10-16: 1000 mL via INTRAVENOUS

## 2020-10-16 MED ORDER — ONDANSETRON HCL 4 MG/2ML IJ SOLN
4.0000 mg | Freq: Once | INTRAMUSCULAR | Status: AC
  Administered 2020-10-16: 4 mg via INTRAVENOUS
  Filled 2020-10-16: qty 2

## 2020-10-16 MED ORDER — OXYCODONE-ACETAMINOPHEN 5-325 MG PO TABS: 1.0000 | ORAL_TABLET | Freq: Four times a day (QID) | ORAL | 0 refills | Status: DC | PRN

## 2020-10-16 MED ORDER — HYDROMORPHONE HCL 1 MG/ML IJ SOLN
1.0000 mg | Freq: Once | INTRAMUSCULAR | Status: AC
  Administered 2020-10-16: 1 mg via INTRAVENOUS
  Filled 2020-10-16: qty 1

## 2020-10-16 MED ORDER — OXYCODONE-ACETAMINOPHEN 5-325 MG PO TABS
1.0000 | ORAL_TABLET | Freq: Once | ORAL | Status: AC
  Administered 2020-10-16: 1 via ORAL
  Filled 2020-10-16: qty 1

## 2020-10-16 MED ORDER — LORAZEPAM 2 MG/ML IJ SOLN
0.5000 mg | Freq: Once | INTRAMUSCULAR | Status: AC
  Administered 2020-10-16: 0.5 mg via INTRAVENOUS
  Filled 2020-10-16: qty 1

## 2020-10-16 MED ORDER — PANTOPRAZOLE SODIUM 40 MG IV SOLR
40.0000 mg | Freq: Once | INTRAVENOUS | Status: AC
  Administered 2020-10-16: 40 mg via INTRAVENOUS
  Filled 2020-10-16: qty 40

## 2020-10-16 MED ORDER — IOHEXOL 300 MG/ML  SOLN
75.0000 mL | Freq: Once | INTRAMUSCULAR | Status: AC | PRN
  Administered 2020-10-16: 75 mL via INTRAVENOUS

## 2020-10-16 MED ORDER — SUCRALFATE 1 GM/10ML PO SUSP: 1.0000 g | Freq: Three times a day (TID) | ORAL | 0 refills | Status: AC

## 2020-10-16 MED ORDER — PANTOPRAZOLE SODIUM 40 MG PO TBEC: 40.0000 mg | DELAYED_RELEASE_TABLET | Freq: Two times a day (BID) | ORAL | 1 refills | Status: AC

## 2020-10-16 NOTE — ED Provider Notes (Signed)
[facility] EMERGENCY DEPARTMENT Provider Note   CSN: [id number] Arrival date & time: 10/16/20  0735     History Chief Complaint  Patient presents with  . Abdominal Pain    David Miranda is a 35 y.o. male.  Patient complains of epigastric pain.  Patient has a history of severe gastritis and even had a bleeding ulcer that he had operated on..  Patient is not vomiting is not passing any blood from his right  The history is provided by the patient and medical records. No language interpreter was used.  Abdominal Pain Pain location:  Epigastric Pain quality: aching   Pain radiates to:  Does not radiate Pain severity:  Moderate Onset quality:  Sudden Timing:  Constant Progression:  Worsening Chronicity:  Recurrent Context: not awakening from sleep   Relieved by:  Nothing Associated symptoms: no chest pain, no cough, no diarrhea, no fatigue and no hematuria        Past Medical History:  Diagnosis Date  . GERD (gastroesophageal reflux disease)   . Pancreatitis     Patient Active Problem List   Diagnosis Date Noted  . Microcytic anemia   . Polysubstance abuse ([place])   . Excessive use of nonsteroidal anti-inflammatory drug (NSAID)   . Intractable abdominal pain 05/16/2019  . SBO (small bowel obstruction) ([place])   . Peritonitis ([place]) 04/13/2019  . Gastric outlet obstruction   . Abdominal pain   . GI bleed 03/31/2019  . Emesis, persistent 03/30/2019  . Esophagitis 03/30/2019  . Pancreatic pseudocyst/cyst--alcoholic pancreatitis 81/06/7508  . Acute necrotizing pancreatitis 07/24/2018  . Cannabis abuse 03/27/2018  . Acute kidney injury ([place])   . Hypomagnesemia   . Acute on chronic pancreatitis ([place]) 03/26/2018  . GERD (gastroesophageal reflux disease) 03/26/2018  . Acute renal failure (ARF) ([place]) 03/26/2018  . Hypokalemia 02/27/2018  . Essential hypertension 02/27/2018  . Tobacco abuse 02/27/2018  . Acute pancreatitis 02/27/2018  . Constipation   . Annular pancreas    . Pancreatitis   . Non-intractable vomiting with nausea   . Hepatic steatosis   . 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 Leukocytosis 12/27/2017  . Abdominal pain, epigastric 12/27/2017  . Acute alcoholic pancreatitis 25/85/2778  . Alcohol abuse 12/26/2017    Past Surgical History:  Procedure Laterality Date  . BIOPSY  09/01/2019   Procedure: BIOPSY;  Surgeon: Daneil Dolin, MD;  Location: [facility] SU[facility];  Service: Endoscopy;;  Gastric Biopsy for H. Pylori   . ESOPHAGOGASTRODUODENOSCOPY (EGD) WITH PROPOFOL N/A 04/01/2019   Procedure: ESOPHAGOGASTRODUODENOSCOPY (EGD) WITH PROPOFOL;  Surgeon: Danie Binder, MD;  Location: [facility];  Service: Endoscopy;  Laterality: N/A;  . ESOPHAGOGASTRODUODENOSCOPY (EGD) WITH PROPOFOL N/A 09/01/2019   Procedure: ESOPHAGOGASTRODUODENOSCOPY (EGD) WITH PROPOFOL;  Surgeon: Daneil Dolin, MD;  Location: [facility];  Service: Endoscopy;  Laterality: N/A;  . GASTROJEJUNOSTOMY N/A 04/04/2019   Procedure: GASTROJEJUNOSTOMY;  Surgeon: Virl Cagey, MD;  Location: [facility] ORS;  Service: General;  Laterality: N/A;  . GASTROJEJUNOSTOMY  04/14/2019   Procedure: REVISION GASTROJEJUNOSTOMY TO ROUX-EN-Y;  Surgeon: Virl Cagey, MD;  Location: [facility] ORS;  Service: General;;  . NO PAST SURGERIES    . TESTICLE SURGERY         Family History  Problem Relation Age of Onset  . Cancer Other     Social History   Tobacco Use  . Smoking status: Current Every Day Smoker    Packs/day: 1.00    Types: Cigarettes  . Smokeless tobacco: Never Used  Vaping Use  . Vaping Use: Never used  Substance Use Topics  . Alcohol use: Not Currently    Comment: pint every few days  . Drug use: Yes    Types: Marijuana, Cocaine    Comment: occasionally    Home Medications Prior to Admission medications   Medication Sig Start Date End Date Taking? Authorizing Provider  oxyCODONE-acetaminophen (PERCOCET) 5-325 MG tablet Take 1 tablet by mouth every 6 (six) hours as  needed. 10/16/20  Yes Milton Ferguson, MD  pantoprazole (PROTONIX) 40 MG tablet Take 1 tablet (40 mg total) by mouth 2 (two) times daily. 10/16/20  Yes Milton Ferguson, MD  sucralfate (CARAFATE) 1 GM/10ML suspension Take 10 mLs (1 g total) by mouth 4 (four) times daily -  with meals and at bedtime. 10/16/20  Yes Milton Ferguson, MD  acetaminophen (TYLENOL) 325 MG tablet Take 2 tablets (650 mg total) by mouth every 6 (six) hours as needed for mild pain (or Fever >/= 101). 09/02/19   Roxan Hockey, MD  calcium carbonate (TUMS - DOSED IN MG ELEMENTAL CALCIUM) 500 MG chewable tablet Chew 2 tablets (400 mg of elemental calcium total) by mouth daily as needed for indigestion or heartburn. 09/02/19   Roxan Hockey, MD  ferrous sulfate 325 (65 FE) MG EC tablet Take 1 tablet (325 mg total) by mouth 2 (two) times daily with a meal. 09/02/19   Emokpae, Courage, MD  hydrOXYzine (ATARAX/VISTARIL) 25 MG tablet Take 1 tablet (25 mg total) by mouth 2 (two) times daily. 09/02/19   Roxan Hockey, MD  linaclotide Rolan Lipa) 145 MCG CAPS capsule Take 1 capsule (145 mcg total) by mouth daily before breakfast. 09/03/19   Roxan Hockey, MD  lipase/protease/amylase (CREON) 36000 UNITS CPEP capsule Take 2 capsules (72,000 Units total) by mouth 3 (three) times daily with meals. 09/02/19   Roxan Hockey, MD  Multiple Vitamin (MULTIVITAMIN WITH MINERALS) TABS tablet Take 1 tablet by mouth daily. 09/02/19   Roxan Hockey, MD  nicotine (NICODERM CQ - DOSED IN MG/24 HOURS) 14 mg/24hr patch Place 1 patch (14 mg total) onto the skin daily. 09/03/19   Roxan Hockey, MD  omeprazole (PRILOSEC) 40 MG capsule Take 1 capsule (40 mg total) by mouth 2 (two) times daily. 09/02/19   Roxan Hockey, MD  polyethylene glycol (MIRALAX / GLYCOLAX) 17 g packet Take 17 g by mouth daily. 09/02/19   Roxan Hockey, MD  traZODone (DESYREL) 150 MG tablet Take 1 tablet (150 mg total) by mouth at bedtime. 09/02/19   Roxan Hockey, MD     Allergies    Patient has no known allergies.  Review of Systems   Review of Systems  Constitutional: Negative for appetite change and fatigue.  HENT: Negative for congestion, ear discharge and sinus pressure.   Eyes: Negative for discharge.  Respiratory: Negative for cough.   Cardiovascular: Negative for chest pain.  Gastrointestinal: Positive for abdominal pain. Negative for diarrhea.  Genitourinary: Negative for frequency and hematuria.  Musculoskeletal: Negative for back pain.  Skin: Negative for rash.  Neurological: Negative for seizures and headaches.  Psychiatric/Behavioral: Negative for hallucinations.    Physical Exam Updated Vital Signs BP 120/70   Pulse (!) 58   Temp (!) 97.4 F (36.3 C) (Oral)   Resp 14   Ht 6' (1.829 m)   Wt 77.1 kg   SpO2 100%   BMI 23.06 kg/m   Physical Exam Constitutional:      Appearance: He is well-developed.  HENT:     Head: Normocephalic.  Right Ear: Tympanic membrane normal.     Nose: Nose normal.  Eyes:     General: No scleral icterus.    Conjunctiva/sclera: Conjunctivae normal.  Neck:     Thyroid: No thyromegaly.  Cardiovascular:     Rate and Rhythm: Normal rate and regular rhythm.     Heart sounds: No murmur heard. No friction rub. No gallop.   Pulmonary:     Breath sounds: No stridor. No wheezing or rales.  Chest:     Chest wall: No tenderness.  Abdominal:     General: There is no distension.     Tenderness: There is abdominal tenderness. There is no rebound.  Musculoskeletal:        General: Normal range of motion.     Cervical back: Neck supple.  Lymphadenopathy:     Cervical: No cervical adenopathy.  Skin:    Findings: No erythema or rash.  Neurological:     Mental Status: He is alert and oriented to person, place, and time.     Motor: No abnormal muscle tone.     Coordination: Coordination normal.  Psychiatric:        Behavior: Behavior normal.     ED Results / Procedures / Treatments    Labs (all labs ordered are listed, but only abnormal results are displayed) Labs Reviewed  CBC WITH DIFFERENTIAL/PLATELET - Abnormal; Notable for the following components:      Result Value   RBC 5.83 (*)    MCV 74.6 (*)    MCH 23.0 (*)    RDW 29.7 (*)    All other components within normal limits  COMPREHENSIVE METABOLIC PANEL - Abnormal; Notable for the following components:   Glucose, Bld 136 (*)    All other components within normal limits  URINALYSIS, ROUTINE W REFLEX MICROSCOPIC - Abnormal; Notable for the following components:   Specific Gravity, Urine >1.046 (*)    All other components within normal limits  LIPASE, BLOOD    EKG None  Radiology CT ABDOMEN PELVIS W CONTRAST  Result Date: 10/16/2020 CLINICAL DATA:  Abdominal abscess or infection suspected. LEFT-sided abdominal pain. Patient thinks it is pancreatitis. Nausea, vomiting, diarrhea. EXAM: CT ABDOMEN AND PELVIS WITH CONTRAST TECHNIQUE: Multidetector CT imaging of the abdomen and pelvis was performed using the standard protocol following bolus administration of intravenous contrast. CONTRAST:  48mL OMNIPAQUE IOHEXOL 300 MG/ML  SOLN COMPARISON:  CT of abdomen and pelvis on 09/01/2019 FINDINGS: Lower chest: Lung bases are unremarkable. Hepatobiliary: No focal liver abnormality is seen. No radiopaque gallstones, biliary dilatation, or pericholecystic inflammatory changes. Pancreas: Unremarkable. No pancreatic ductal dilatation or surrounding inflammatory changes. Spleen: Normal in size without focal abnormality. Adrenals/Urinary Tract: Adrenal glands are normal in appearance. Kidneys are symmetric in size and enhancement. No renal mass. No intrarenal stones or ureteral obstruction. The bladder and visualized portion of the urethra are normal. Stomach/Bowel: Status post gastrojejunostomy. Duodenal limb is unremarkable. There is minimal inflammatory change within the omentum at the anastomosis. Small omental lymph nodes identified  in the same region, consistent with mild inflammatory change at the anastomosis. The small bowel loops are otherwise normal in appearance. Colon is unremarkable. The appendix is well seen and has a normal appearance. Vascular/Lymphatic: No significant vascular findings are present. No enlarged abdominal or pelvic lymph nodes. Reproductive: Prostate is unremarkable. Other: Postoperative changes in the anterior UPPER abdominal wall. No ascites. Musculoskeletal: No acute or significant osseous findings. IMPRESSION: 1. Status post gastrojejunostomy. There are minimal inflammatory changes at the gastrojejunostomy,  associated with small surrounding omental lymph nodes. Similar changes 7 seen on prior studies. 2. No evidence for bowel obstruction. 3. Normal appendix. 4. Pancreas is normal in appearance. Electronically Signed   By: Nolon Nations M.D.   On: 10/16/2020 10:03    Procedures Procedures   Medications Ordered in ED Medications  sodium chloride 0.9 % bolus 1,000 mL (0 mLs Intravenous Stopped 10/16/20 0931)  pantoprazole (PROTONIX) injection 40 mg (40 mg Intravenous Given 10/16/20 0816)  HYDROmorphone (DILAUDID) injection 1 mg (1 mg Intravenous Given 10/16/20 0815)  ondansetron (ZOFRAN) injection 4 mg (4 mg Intravenous Given 10/16/20 0817)  iohexol (OMNIPAQUE) 300 MG/ML solution 75 mL (75 mLs Intravenous Contrast Given 10/16/20 0918)  HYDROmorphone (DILAUDID) injection 1 mg (1 mg Intravenous Given 10/16/20 0942)  LORazepam (ATIVAN) injection 0.5 mg (0.5 mg Intravenous Given 10/16/20 [id number])    ED Course  I have reviewed the triage vital signs and the nursing notes.  Pertinent labs & imaging results that were available during my care of the patient were reviewed by me and considered in my medical decision making (see chart for details).    MDM Rules/Calculators/A&P                          Patient with severe gastritis.  He is started back on Protonix 40 mg twice a day and Carafate and given  Percocet and referred to GI. Final Clinical Impression(s) / ED Diagnoses Final diagnoses:  Pain of upper abdomen  Other acute gastritis, presence of bleeding unspecified    Rx / DC Orders ED Discharge Orders         Ordered    pantoprazole (PROTONIX) 40 MG tablet  2 times daily        10/16/20 1154    oxyCODONE-acetaminophen (PERCOCET) 5-325 MG tablet  Every 6 hours PRN        10/16/20 1154    sucralfate (CARAFATE) 1 GM/10ML suspension  3 times daily with meals & bedtime        10/16/20 1154           Milton Ferguson, MD 10/20/20 1022

## 2020-10-16 NOTE — ED Triage Notes (Signed)
Patient arrives with c/o left side abdominal pain, stated he thinks he has pancreatitis, but never diagnosed, c/o nausea, vomiting, diarrhea, denies fever.

## 2020-10-16 NOTE — Discharge Instructions (Signed)
Follow-up with the gastroenterologist Dr. Laural Golden or one of his partners in 1 to 2 weeks

## 2022-04-29 ENCOUNTER — Inpatient Hospital Stay (HOSPITAL_COMMUNITY)
Admission: EM | Admit: 2022-04-29 | Discharge: 2022-05-03 | DRG: 380 | Discharge status: Against Medical Advice | Payer: 59 | Attending: Internal Medicine | Admitting: Internal Medicine

## 2022-04-29 DIAGNOSIS — N179 Acute kidney failure, unspecified: Secondary | ICD-10-CM | POA: Diagnosis not present

## 2022-04-29 DIAGNOSIS — F1190 Opioid use, unspecified, uncomplicated: Secondary | ICD-10-CM | POA: Diagnosis present

## 2022-04-29 DIAGNOSIS — T39395A Adverse effect of other nonsteroidal anti-inflammatory drugs [NSAID], initial encounter: Secondary | ICD-10-CM | POA: Diagnosis present

## 2022-04-29 DIAGNOSIS — J189 Pneumonia, unspecified organism: Secondary | ICD-10-CM

## 2022-04-29 DIAGNOSIS — Z743 Need for continuous supervision: Secondary | ICD-10-CM | POA: Diagnosis not present

## 2022-04-29 DIAGNOSIS — Z5329 Procedure and treatment not carried out because of patient's decision for other reasons: Secondary | ICD-10-CM | POA: Diagnosis present

## 2022-04-29 DIAGNOSIS — R748 Abnormal levels of other serum enzymes: Secondary | ICD-10-CM | POA: Diagnosis present

## 2022-04-29 DIAGNOSIS — F17200 Nicotine dependence, unspecified, uncomplicated: Secondary | ICD-10-CM | POA: Insufficient documentation

## 2022-04-29 DIAGNOSIS — J181 Lobar pneumonia, unspecified organism: Secondary | ICD-10-CM | POA: Diagnosis present

## 2022-04-29 DIAGNOSIS — I959 Hypotension, unspecified: Secondary | ICD-10-CM | POA: Diagnosis not present

## 2022-04-29 DIAGNOSIS — E860 Dehydration: Secondary | ICD-10-CM | POA: Diagnosis present

## 2022-04-29 DIAGNOSIS — K311 Adult hypertrophic pyloric stenosis: Secondary | ICD-10-CM | POA: Diagnosis not present

## 2022-04-29 DIAGNOSIS — I10 Essential (primary) hypertension: Secondary | ICD-10-CM | POA: Diagnosis not present

## 2022-04-29 DIAGNOSIS — K2100 Gastro-esophageal reflux disease with esophagitis, without bleeding: Secondary | ICD-10-CM | POA: Diagnosis present

## 2022-04-29 DIAGNOSIS — E871 Hypo-osmolality and hyponatremia: Secondary | ICD-10-CM | POA: Diagnosis present

## 2022-04-29 DIAGNOSIS — Z79899 Other long term (current) drug therapy: Secondary | ICD-10-CM

## 2022-04-29 DIAGNOSIS — E876 Hypokalemia: Secondary | ICD-10-CM | POA: Diagnosis present

## 2022-04-29 DIAGNOSIS — R7309 Other abnormal glucose: Secondary | ICD-10-CM

## 2022-04-29 DIAGNOSIS — Y92009 Unspecified place in unspecified non-institutional (private) residence as the place of occurrence of the external cause: Secondary | ICD-10-CM

## 2022-04-29 DIAGNOSIS — F1590 Other stimulant use, unspecified, uncomplicated: Secondary | ICD-10-CM | POA: Diagnosis present

## 2022-04-29 DIAGNOSIS — K56609 Unspecified intestinal obstruction, unspecified as to partial versus complete obstruction: Secondary | ICD-10-CM | POA: Diagnosis present

## 2022-04-29 DIAGNOSIS — K449 Diaphragmatic hernia without obstruction or gangrene: Secondary | ICD-10-CM | POA: Diagnosis present

## 2022-04-29 DIAGNOSIS — Z8711 Personal history of peptic ulcer disease: Secondary | ICD-10-CM

## 2022-04-29 DIAGNOSIS — K2980 Duodenitis without bleeding: Secondary | ICD-10-CM | POA: Diagnosis present

## 2022-04-29 DIAGNOSIS — K209 Esophagitis, unspecified without bleeding: Secondary | ICD-10-CM | POA: Diagnosis present

## 2022-04-29 DIAGNOSIS — K289 Gastrojejunal ulcer, unspecified as acute or chronic, without hemorrhage or perforation: Secondary | ICD-10-CM | POA: Diagnosis present

## 2022-04-29 DIAGNOSIS — Z9889 Other specified postprocedural states: Secondary | ICD-10-CM

## 2022-04-29 DIAGNOSIS — F17210 Nicotine dependence, cigarettes, uncomplicated: Secondary | ICD-10-CM | POA: Diagnosis present

## 2022-04-29 DIAGNOSIS — K269 Duodenal ulcer, unspecified as acute or chronic, without hemorrhage or perforation: Secondary | ICD-10-CM

## 2022-04-29 DIAGNOSIS — T68XXXA Hypothermia, initial encounter: Secondary | ICD-10-CM | POA: Diagnosis not present

## 2022-04-29 DIAGNOSIS — Z28310 Unvaccinated for covid-19: Secondary | ICD-10-CM

## 2022-04-29 DIAGNOSIS — F1910 Other psychoactive substance abuse, uncomplicated: Secondary | ICD-10-CM | POA: Diagnosis present

## 2022-04-29 DIAGNOSIS — D500 Iron deficiency anemia secondary to blood loss (chronic): Secondary | ICD-10-CM | POA: Diagnosis present

## 2022-04-30 ENCOUNTER — Other Ambulatory Visit: Payer: Self-pay

## 2022-04-30 ENCOUNTER — Inpatient Hospital Stay (HOSPITAL_COMMUNITY): Payer: 59

## 2022-04-30 ENCOUNTER — Encounter (HOSPITAL_COMMUNITY): Payer: Self-pay

## 2022-04-30 ENCOUNTER — Emergency Department (HOSPITAL_COMMUNITY): Payer: 59

## 2022-04-30 DIAGNOSIS — F1910 Other psychoactive substance abuse, uncomplicated: Secondary | ICD-10-CM

## 2022-04-30 DIAGNOSIS — D500 Iron deficiency anemia secondary to blood loss (chronic): Secondary | ICD-10-CM | POA: Diagnosis not present

## 2022-04-30 DIAGNOSIS — T39395A Adverse effect of other nonsteroidal anti-inflammatory drugs [NSAID], initial encounter: Secondary | ICD-10-CM | POA: Diagnosis not present

## 2022-04-30 DIAGNOSIS — E860 Dehydration: Secondary | ICD-10-CM | POA: Diagnosis not present

## 2022-04-30 DIAGNOSIS — J181 Lobar pneumonia, unspecified organism: Secondary | ICD-10-CM | POA: Diagnosis not present

## 2022-04-30 DIAGNOSIS — J189 Pneumonia, unspecified organism: Secondary | ICD-10-CM | POA: Diagnosis not present

## 2022-04-30 DIAGNOSIS — F1590 Other stimulant use, unspecified, uncomplicated: Secondary | ICD-10-CM | POA: Diagnosis not present

## 2022-04-30 DIAGNOSIS — K311 Adult hypertrophic pyloric stenosis: Secondary | ICD-10-CM | POA: Diagnosis not present

## 2022-04-30 DIAGNOSIS — F1190 Opioid use, unspecified, uncomplicated: Secondary | ICD-10-CM | POA: Diagnosis not present

## 2022-04-30 DIAGNOSIS — R748 Abnormal levels of other serum enzymes: Secondary | ICD-10-CM

## 2022-04-30 DIAGNOSIS — R109 Unspecified abdominal pain: Secondary | ICD-10-CM | POA: Diagnosis not present

## 2022-04-30 DIAGNOSIS — Z5329 Procedure and treatment not carried out because of patient's decision for other reasons: Secondary | ICD-10-CM | POA: Diagnosis not present

## 2022-04-30 DIAGNOSIS — K449 Diaphragmatic hernia without obstruction or gangrene: Secondary | ICD-10-CM | POA: Diagnosis not present

## 2022-04-30 DIAGNOSIS — Z4682 Encounter for fitting and adjustment of non-vascular catheter: Secondary | ICD-10-CM | POA: Diagnosis not present

## 2022-04-30 DIAGNOSIS — R933 Abnormal findings on diagnostic imaging of other parts of digestive tract: Secondary | ICD-10-CM | POA: Diagnosis not present

## 2022-04-30 DIAGNOSIS — F17200 Nicotine dependence, unspecified, uncomplicated: Secondary | ICD-10-CM

## 2022-04-30 DIAGNOSIS — E876 Hypokalemia: Secondary | ICD-10-CM | POA: Diagnosis not present

## 2022-04-30 DIAGNOSIS — K2100 Gastro-esophageal reflux disease with esophagitis, without bleeding: Secondary | ICD-10-CM | POA: Diagnosis not present

## 2022-04-30 DIAGNOSIS — N179 Acute kidney failure, unspecified: Secondary | ICD-10-CM

## 2022-04-30 DIAGNOSIS — K209 Esophagitis, unspecified without bleeding: Secondary | ICD-10-CM | POA: Diagnosis not present

## 2022-04-30 DIAGNOSIS — R69 Illness, unspecified: Secondary | ICD-10-CM | POA: Diagnosis not present

## 2022-04-30 DIAGNOSIS — Z9889 Other specified postprocedural states: Secondary | ICD-10-CM | POA: Diagnosis not present

## 2022-04-30 DIAGNOSIS — F17210 Nicotine dependence, cigarettes, uncomplicated: Secondary | ICD-10-CM | POA: Diagnosis not present

## 2022-04-30 DIAGNOSIS — K3189 Other diseases of stomach and duodenum: Secondary | ICD-10-CM | POA: Diagnosis not present

## 2022-04-30 DIAGNOSIS — Z79899 Other long term (current) drug therapy: Secondary | ICD-10-CM | POA: Diagnosis not present

## 2022-04-30 DIAGNOSIS — K269 Duodenal ulcer, unspecified as acute or chronic, without hemorrhage or perforation: Secondary | ICD-10-CM | POA: Diagnosis not present

## 2022-04-30 DIAGNOSIS — K289 Gastrojejunal ulcer, unspecified as acute or chronic, without hemorrhage or perforation: Secondary | ICD-10-CM | POA: Diagnosis not present

## 2022-04-30 DIAGNOSIS — Z28310 Unvaccinated for covid-19: Secondary | ICD-10-CM | POA: Diagnosis not present

## 2022-04-30 DIAGNOSIS — K56609 Unspecified intestinal obstruction, unspecified as to partial versus complete obstruction: Secondary | ICD-10-CM | POA: Diagnosis not present

## 2022-04-30 DIAGNOSIS — Z8711 Personal history of peptic ulcer disease: Secondary | ICD-10-CM | POA: Diagnosis not present

## 2022-04-30 DIAGNOSIS — I700 Atherosclerosis of aorta: Secondary | ICD-10-CM | POA: Diagnosis not present

## 2022-04-30 DIAGNOSIS — Y92009 Unspecified place in unspecified non-institutional (private) residence as the place of occurrence of the external cause: Secondary | ICD-10-CM | POA: Diagnosis not present

## 2022-04-30 DIAGNOSIS — E871 Hypo-osmolality and hyponatremia: Secondary | ICD-10-CM | POA: Diagnosis not present

## 2022-04-30 DIAGNOSIS — K2980 Duodenitis without bleeding: Secondary | ICD-10-CM | POA: Diagnosis not present

## 2022-04-30 LAB — COMPREHENSIVE METABOLIC PANEL
ALT: 20 U/L (ref 0–44)
AST: 21 U/L (ref 15–41)
Albumin: 4.4 g/dL (ref 3.5–5.0)
Alkaline Phosphatase: 80 U/L (ref 38–126)
Anion gap: 14 (ref 5–15)
BUN: 30 mg/dL — ABNORMAL HIGH (ref 6–20)
CO2: 25 mmol/L (ref 22–32)
Calcium: 9.2 mg/dL (ref 8.9–10.3)
Chloride: 95 mmol/L — ABNORMAL LOW (ref 98–111)
Creatinine, Ser: 1.56 mg/dL — ABNORMAL HIGH (ref 0.61–1.24)
GFR, Estimated: 59 mL/min — ABNORMAL LOW (ref >60)
Glucose, Bld: 117 mg/dL — ABNORMAL HIGH (ref 70–99)
Potassium: 3.7 mmol/L (ref 3.5–5.1)
Sodium: 134 mmol/L — ABNORMAL LOW (ref 135–145)
Total Bilirubin: 0.3 mg/dL (ref 0.3–1.2)
Total Protein: 7.9 g/dL (ref 6.5–8.1)

## 2022-04-30 LAB — CBC WITH DIFFERENTIAL/PLATELET
Abs Immature Granulocytes: 0.14 10*3/uL — ABNORMAL HIGH (ref 0.00–0.07)
Basophils Absolute: 0.1 10*3/uL (ref 0.0–0.1)
Basophils Relative: 0 %
Eosinophils Absolute: 0 10*3/uL (ref 0.0–0.5)
Eosinophils Relative: 0 %
HCT: 41.8 % (ref 39.0–52.0)
Hemoglobin: 12.9 g/dL — ABNORMAL LOW (ref 13.0–17.0)
Immature Granulocytes: 1 %
Lymphocytes Relative: 3 %
Lymphs Abs: 0.6 10*3/uL — ABNORMAL LOW (ref 0.7–4.0)
MCH: 21.9 pg — ABNORMAL LOW (ref 26.0–34.0)
MCHC: 30.9 g/dL (ref 30.0–36.0)
MCV: 71.1 fL — ABNORMAL LOW (ref 80.0–100.0)
Monocytes Absolute: 0.9 10*3/uL (ref 0.1–1.0)
Monocytes Relative: 4 %
Neutro Abs: 22.8 10*3/uL — ABNORMAL HIGH (ref 1.7–7.7)
Neutrophils Relative %: 92 %
Platelets: 343 10*3/uL (ref 150–400)
RBC: 5.88 MIL/uL — ABNORMAL HIGH (ref 4.22–5.81)
RDW: 18.6 % — ABNORMAL HIGH (ref 11.5–15.5)
WBC: 24.5 10*3/uL — ABNORMAL HIGH (ref 4.0–10.5)
nRBC: 0 % (ref 0.0–0.2)

## 2022-04-30 LAB — STREP PNEUMONIAE URINARY ANTIGEN: Strep Pneumo Urinary Antigen: NEGATIVE

## 2022-04-30 LAB — RAPID URINE DRUG SCREEN, HOSP PERFORMED
Amphetamines: POSITIVE — AB
Barbiturates: NOT DETECTED
Benzodiazepines: NOT DETECTED
Cocaine: POSITIVE — AB
Opiates: POSITIVE — AB
Tetrahydrocannabinol: POSITIVE — AB

## 2022-04-30 LAB — LIPASE, BLOOD: Lipase: 106 U/L — ABNORMAL HIGH (ref 11–51)

## 2022-04-30 LAB — TYPE AND SCREEN
ABO/RH(D): B NEG
Antibody Screen: NEGATIVE

## 2022-04-30 LAB — HIV ANTIBODY (ROUTINE TESTING W REFLEX): HIV Screen 4th Generation wRfx: NONREACTIVE

## 2022-04-30 MED ORDER — LORAZEPAM 2 MG/ML IJ SOLN
1.0000 mg | Freq: Four times a day (QID) | INTRAMUSCULAR | Status: DC
  Administered 2022-04-30 – 2022-05-01 (×4): 1 mg via INTRAVENOUS
  Filled 2022-04-30 (×5): qty 1

## 2022-04-30 MED ORDER — OXYCODONE HCL 5 MG PO TABS
5.0000 mg | ORAL_TABLET | ORAL | Status: DC | PRN
  Administered 2022-05-02 (×2): 5 mg via ORAL
  Filled 2022-04-30 (×3): qty 1

## 2022-04-30 MED ORDER — LACTATED RINGERS IV BOLUS
1000.0000 mL | Freq: Once | INTRAVENOUS | Status: AC
  Administered 2022-04-30: 1000 mL via INTRAVENOUS

## 2022-04-30 MED ORDER — HYDROXYZINE HCL 25 MG PO TABS
25.0000 mg | ORAL_TABLET | Freq: Two times a day (BID) | ORAL | Status: DC
  Administered 2022-05-01 – 2022-05-03 (×4): 25 mg via ORAL
  Filled 2022-04-30 (×7): qty 1

## 2022-04-30 MED ORDER — TRAZODONE HCL 50 MG PO TABS
150.0000 mg | ORAL_TABLET | Freq: Every day | ORAL | Status: DC
  Administered 2022-05-01 – 2022-05-02 (×2): 150 mg via ORAL
  Filled 2022-04-30 (×4): qty 3

## 2022-04-30 MED ORDER — ONDANSETRON HCL 4 MG/2ML IJ SOLN
4.0000 mg | Freq: Four times a day (QID) | INTRAMUSCULAR | Status: DC | PRN
  Filled 2022-04-30: qty 2

## 2022-04-30 MED ORDER — SODIUM CHLORIDE 0.9 % IV SOLN: INTRAVENOUS | Status: DC

## 2022-04-30 MED ORDER — SODIUM CHLORIDE 0.9 % IV SOLN
1.0000 g | Freq: Every day | INTRAVENOUS | Status: DC
  Administered 2022-04-30 – 2022-05-03 (×4): 1 g via INTRAVENOUS
  Filled 2022-04-30 (×4): qty 10

## 2022-04-30 MED ORDER — PANTOPRAZOLE SODIUM 40 MG IV SOLR
40.0000 mg | Freq: Once | INTRAVENOUS | Status: AC
  Administered 2022-04-30: 40 mg via INTRAVENOUS
  Filled 2022-04-30: qty 10

## 2022-04-30 MED ORDER — PANTOPRAZOLE SODIUM 40 MG IV SOLR: 40.0000 mg | Freq: Two times a day (BID) | INTRAVENOUS | Status: DC

## 2022-04-30 MED ORDER — ACETAMINOPHEN 650 MG RE SUPP: 650.0000 mg | Freq: Four times a day (QID) | RECTAL | Status: DC | PRN

## 2022-04-30 MED ORDER — IOHEXOL 300 MG/ML  SOLN
80.0000 mL | Freq: Once | INTRAMUSCULAR | Status: AC | PRN
  Administered 2022-04-30: 80 mL via INTRAVENOUS

## 2022-04-30 MED ORDER — PANTOPRAZOLE INFUSION (NEW) - SIMPLE MED
8.0000 mg/h | INTRAVENOUS | Status: DC
  Administered 2022-04-30: 8 mg/h via INTRAVENOUS
  Filled 2022-04-30 (×2): qty 100

## 2022-04-30 MED ORDER — ALBUTEROL SULFATE (2.5 MG/3ML) 0.083% IN NEBU: 2.5000 mg | INHALATION_SOLUTION | RESPIRATORY_TRACT | Status: DC | PRN

## 2022-04-30 MED ORDER — SODIUM CHLORIDE 0.9 % IV SOLN
500.0000 mg | Freq: Every day | INTRAVENOUS | Status: DC
  Administered 2022-04-30 – 2022-05-03 (×4): 500 mg via INTRAVENOUS
  Filled 2022-04-30 (×4): qty 5

## 2022-04-30 MED ORDER — ACETAMINOPHEN 325 MG PO TABS
650.0000 mg | ORAL_TABLET | Freq: Four times a day (QID) | ORAL | Status: DC | PRN
  Filled 2022-04-30: qty 2

## 2022-04-30 MED ORDER — ONDANSETRON HCL 4 MG PO TABS: 4.0000 mg | ORAL_TABLET | Freq: Four times a day (QID) | ORAL | Status: DC | PRN

## 2022-04-30 MED ORDER — PANTOPRAZOLE 80MG IVPB - SIMPLE MED
80.0000 mg | Freq: Once | INTRAVENOUS | Status: AC
  Administered 2022-04-30: 80 mg via INTRAVENOUS
  Filled 2022-04-30: qty 100

## 2022-04-30 MED ORDER — ONDANSETRON HCL 4 MG/2ML IJ SOLN
4.0000 mg | Freq: Once | INTRAMUSCULAR | Status: AC | PRN
  Administered 2022-04-30: 4 mg via INTRAVENOUS
  Filled 2022-04-30: qty 2

## 2022-04-30 MED ORDER — HEPARIN SODIUM (PORCINE) 5000 UNIT/ML IJ SOLN
5000.0000 [IU] | Freq: Three times a day (TID) | INTRAMUSCULAR | Status: AC
  Administered 2022-04-30 – 2022-05-01 (×5): 5000 [IU] via SUBCUTANEOUS
  Filled 2022-04-30 (×4): qty 1

## 2022-04-30 MED ORDER — MORPHINE SULFATE (PF) 4 MG/ML IV SOLN
4.0000 mg | INTRAVENOUS | Status: DC | PRN
  Administered 2022-04-30 – 2022-05-01 (×7): 4 mg via INTRAVENOUS
  Filled 2022-04-30 (×7): qty 1

## 2022-04-30 NOTE — Assessment & Plan Note (Signed)
-  Bridges to see in the AM -Refusing NG tube -NPO except for ice chips -Pain control with pain scale -Continue to monitor

## 2022-04-30 NOTE — Assessment & Plan Note (Addendum)
-  Cr. 1.14>> 1.56 -Continue IV fluids -Likely 2/2 poor PO intake given abdominal pain

## 2022-04-30 NOTE — Assessment & Plan Note (Signed)
With infiltrate on CT scan Expectorated sputum culture, strep and Legionella urine antigens Rocephin and Zithromax

## 2022-04-30 NOTE — Assessment & Plan Note (Signed)
-  declines nicotine patch in the AM -Advised on the importance of cessation

## 2022-04-30 NOTE — ED Notes (Signed)
Pt asleep.

## 2022-04-30 NOTE — Consult Note (Signed)
[facility] Consult  Reason for Consult: GOO at [facility] anastomosis  Referring Physician: Dr. Roderic Palau  Chief Complaint   Abdominal Pain     HPI: David Miranda is a 36 y.o. 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 He has been known to take NSAIDs and had a EGD with ulcers at the anastomosis in 2021. He is current drug user and positive for THC, Cocaine, Amphetamines, and opiates.  He refused NG overnight but allowed it to be placed this AM. He is being treated for PNA likely in the setting of aspiration given the GOO.   Past Medical History:  Diagnosis Date   GERD (gastroesophageal reflux disease)    Pancreatitis     Past Surgical History:  Procedure Laterality Date   BIOPSY  09/01/2019   Procedure: BIOPSY;  Surgeon: Daneil Dolin, MD;  [facility]: [facility];  Service: Endoscopy;;  Gastric Biopsy for H. Pylori    ESOPHAGOGASTRODUODENOSCOPY (EGD) WITH PROPOFOL N/A 04/01/2019   Procedure: ESOPHAGOGASTRODUODENOSCOPY (EGD) WITH PROPOFOL;  Surgeon: Danie Binder, MD;  Location: [facility];  Service: Endoscopy;  Laterality: N/A;   ESOPHAGOGASTRODUODENOSCOPY (EGD) WITH PROPOFOL N/A 09/01/2019   Procedure: ESOPHAGOGASTRODUODENOSCOPY (EGD) WITH PROPOFOL;  Surgeon: Daneil Dolin, MD;  [facility]: [facility];  Service: Endoscopy;  Laterality: N/A;   GASTROJEJUNOSTOMY N/A 04/04/2019   Procedure: GASTROJEJUNOSTOMY;  Surgeon: Virl Cagey, MD;  Location: [facility];  Service: General;  Laterality: N/A;   GASTROJEJUNOSTOMY  04/14/2019   Procedure: REVISION GASTROJEJUNOSTOMY TO ROUX-EN-Y;  Surgeon: Virl Cagey, MD;  Location: [facility];  Service: General;;   NO PAST SURGERIES     TESTICLE SURGERY      Family History  Problem Relation Age of Onset   Cancer Other     Social  History   Tobacco Use   Smoking status: Every Day    Packs/day: 1.00    Types: Cigarettes   Smokeless tobacco: Never  Vaping Use   Vaping Use: Never used  Substance Use Topics   Alcohol use: Not Currently    Comment: pint every few days   Drug use: Yes    Types: Marijuana, Cocaine    Comment: occasionally    Medications: I have reviewed the patient's current medications. Prior to Admission:  Medications Prior to Admission  Medication Sig Dispense Refill Last Dose   acetaminophen (TYLENOL) 325 MG tablet Take 2 tablets (650 mg total) by mouth every 6 (six) hours as needed for mild pain (or Fever >/= 101). 12 tablet 0    calcium carbonate (TUMS - DOSED IN MG ELEMENTAL CALCIUM) 500 MG chewable tablet Chew 2 tablets (400 mg of elemental calcium total) by mouth daily as needed for indigestion or heartburn. 30 tablet 1    ferrous sulfate 325 (65 FE) MG EC tablet Take 1 tablet (325 mg total) by mouth 2 (two) times daily with a meal. 60 tablet 3    hydrOXYzine (ATARAX/VISTARIL) 25 MG tablet Take 1 tablet (25 mg total) by mouth 2 (two) times daily. 60 tablet 0    linaclotide (LINZESS) 145 MCG CAPS capsule Take 1 capsule (145 mcg total) by mouth daily before breakfast. 30 capsule 2    lipase/protease/amylase (CREON) 36000 UNITS CPEP capsule Take 2 capsules (72,000 Units total) by mouth 3 (  three) times daily with meals. 270 capsule 3    Multiple Vitamin (MULTIVITAMIN WITH MINERALS) TABS tablet Take 1 tablet by mouth daily. 30 tablet 5    nicotine (NICODERM CQ - DOSED IN MG/24 HOURS) 14 mg/24hr patch Place 1 patch (14 mg total) onto the skin daily. 28 patch 0    omeprazole (PRILOSEC) 40 MG capsule Take 1 capsule (40 mg total) by mouth 2 (two) times daily. 60 capsule 5    oxyCODONE-acetaminophen (PERCOCET) 5-325 MG tablet Take 1 tablet by mouth every 6 (six) hours as needed. 20 tablet 0    pantoprazole (PROTONIX) 40 MG tablet Take 1 tablet (40 mg total) by mouth 2 (two) times daily. 60 tablet 1     polyethylene glycol (MIRALAX / GLYCOLAX) 17 g packet Take 17 g by mouth daily. 30 each 3    sucralfate (CARAFATE) 1 GM/10ML suspension Take 10 mLs (1 g total) by mouth 4 (four) times daily -  with meals and at bedtime. 420 mL 0    traZODone (DESYREL) 150 MG tablet Take 1 tablet (150 mg total) by mouth at bedtime. 30 tablet 2    Scheduled:  heparin  5,000 Units Subcutaneous Q8H   hydrOXYzine  25 mg Oral BID   LORazepam  1 mg Intravenous Q6H   [START ON 05/03/2022] pantoprazole  40 mg Intravenous Q12H   traZODone  150 mg Oral QHS   Continuous:  sodium chloride 100 mL/hr at 04/30/22 0619   azithromycin 500 mg (04/30/22 0941)   cefTRIAXone (ROCEPHIN)  IV 1 g (04/30/22 1607)   PXT:GGYIRSWNIOEVO **OR** acetaminophen, albuterol, morphine injection, ondansetron **OR** ondansetron (ZOFRAN) IV, oxyCODONE  No Known Allergies   ROS:  A comprehensive review of systems was negative except for: Gastrointestinal: positive for abdominal pain, nausea, and vomiting  Blood pressure 113/75, pulse 74, temperature (!) 97.5 F (36.4 C), temperature source Axillary, resp. rate 14, height 6' (1.829 m), weight 79.4 kg, SpO2 95 %. Physical Exam Vitals reviewed.  Constitutional:      Appearance: He is well-developed.  HENT:     Head: Normocephalic.  Cardiovascular:     Rate and Rhythm: Regular rhythm.  Abdominal:     General: There is distension.     Palpations: Abdomen is soft.     Tenderness: There is abdominal tenderness in the epigastric area.  Musculoskeletal:     Comments: Moves all extremities   Skin:    General: Skin is warm.  Neurological:     General: No focal deficit present.     Mental Status: He is alert.     Comments: Sleepy from pain medication     Results: Results for orders placed or performed during the hospital encounter of 04/29/22 (from the past 48 hour(s))  Lipase, blood     Status: Abnormal   Collection Time: 04/30/22  2:14 AM  Result Value Ref Range   Lipase 106 (H)  11 - 51 U/L    Comment: Performed at [facility], [address] [place], [place] [address]  Comprehensive metabolic panel     Status: Abnormal   Collection Time: 04/30/22  2:14 AM  Result Value Ref Range   Sodium 134 (L) 135 - 145 mmol/L   Potassium 3.7 3.5 - 5.1 mmol/L   Chloride 95 (L) 98 - 111 mmol/L   CO2 25 22 - 32 mmol/L   Glucose, Bld 117 (H) 70 - 99 mg/dL    Comment: Glucose reference range applies only to samples taken after fasting for  at least 8 hours.   BUN 30 (H) 6 - 20 mg/dL   Creatinine, Ser 1.56 (H) 0.61 - 1.24 mg/dL   Calcium 9.2 8.9 - 10.3 mg/dL   Total Protein 7.9 6.5 - 8.1 g/dL   Albumin 4.4 3.5 - 5.0 g/dL   AST 21 15 - 41 U/L   ALT 20 0 - 44 U/L   Alkaline Phosphatase 80 38 - 126 U/L   Total Bilirubin 0.3 0.3 - 1.2 mg/dL   GFR, Estimated 59 (L) >60 mL/min    Comment: (NOTE) Calculated using the CKD-EPI Creatinine Equation (2021)    Anion gap 14 5 - 15    Comment: Performed at [facility], [address] [place], [place] [address]  CBC with Differential     Status: Abnormal   Collection Time: 04/30/22  2:15 AM  Result Value Ref Range   WBC 24.5 (H) 4.0 - 10.5 K/uL   RBC 5.88 (H) 4.22 - 5.81 MIL/uL   Hemoglobin 12.9 (L) 13.0 - 17.0 g/dL   HCT 41.8 39.0 - 52.0 %   MCV 71.1 (L) 80.0 - 100.0 fL   MCH 21.9 (L) 26.0 - 34.0 pg   MCHC 30.9 30.0 - 36.0 g/dL   RDW 18.6 (H) 11.5 - 15.5 %   Platelets 343 150 - 400 K/uL   nRBC 0.0 0.0 - 0.2 %   Neutrophils Relative % 92 %   Neutro Abs 22.8 (H) 1.7 - 7.7 K/uL   Lymphocytes Relative 3 %   Lymphs Abs 0.6 (L) 0.7 - 4.0 K/uL   Monocytes Relative 4 %   Monocytes Absolute 0.9 0.1 - 1.0 K/uL   Eosinophils Relative 0 %   Eosinophils Absolute 0.0 0.0 - 0.5 K/uL   Basophils Relative 0 %   Basophils Absolute 0.1 0.0 - 0.1 K/uL   Immature Granulocytes 1 %   Abs Immature Granulocytes 0.14 (H) 0.00 - 0.07 K/uL    Comment: Performed at [facility], [address], [place], [place] [address]  Type and screen [facility]     Status: None   Collection Time: 04/30/22  2:15 AM  Result Value Ref Range   ABO/RH(D) B NEG    Antibody Screen NEG    Sample Expiration      05/03/2022,2359 Performed at [facility], [address] [place], [place] [address]   Culture, blood (Routine X 2) w Reflex to ID Panel     Status: None (Preliminary result)   Collection Time: 04/30/22  6:22 AM   Specimen: Right Antecubital; Blood  Result Value Ref Range   Specimen Description      RIGHT ANTECUBITAL BOTTLES DRAWN AEROBIC AND ANAEROBIC   Special Requests      Blood Culture results may not be optimal due to an excessive volume of blood received in culture bottles Performed at [facility], [address] [place], [place] [address]    Culture PENDING    Report Status PENDING   Culture, blood (Routine X 2) w Reflex to ID Panel     Status: None (Preliminary result)   Collection Time: 04/30/22  6:36 AM   Specimen: BLOOD RIGHT HAND  Result Value Ref Range   Specimen Description      BLOOD RIGHT HAND BOTTLES DRAWN AEROBIC AND ANAEROBIC   Special Requests      Blood Culture results may not be optimal due to an excessive volume of blood received in culture bottles Performed at [facility], [address]  [place], [place] [address]    Culture PENDING    Report Status PENDING     CT ABDOMEN PELVIS W CONTRAST  Result Date: 04/30/2022 CLINICAL DATA:  Epigastric pain. EXAM: CT ABDOMEN AND PELVIS WITH CONTRAST TECHNIQUE: Multidetector CT imaging of the abdomen and pelvis was performed using the standard protocol following bolus administration of intravenous contrast. RADIATION DOSE REDUCTION: This exam was performed according to the departmental dose-optimization program which includes automated exposure control, adjustment of the mA and/or kV according to patient size and/or use of iterative reconstruction technique. CONTRAST:  5m OMNIPAQUE IOHEXOL 300 MG/ML  SOLN COMPARISON:  Oct 16, 2020 FINDINGS: Lower chest: Moderate  severity left lower lobe infiltrate is seen. Mild right basilar atelectasis and/or infiltrate is also noted. Hepatobiliary: No focal liver abnormality is seen. No gallstones, gallbladder wall thickening, or biliary dilatation. Pancreas: Unremarkable. No pancreatic ductal dilatation or surrounding inflammatory changes. Spleen: Normal in size without focal abnormality. Adrenals/Urinary Tract: Adrenal glands are unremarkable. Kidneys are normal, without renal calculi, focal lesion, or hydronephrosis. Bladder is unremarkable. Stomach/Bowel: The visualized portion of the distal esophagus is dilated and fluid-filled (measures approximately 3.0 cm x 2.5 cm). Marked severity gastric distension is seen. Surgical sutures are seen within the gastric region, with surgically anastomosed bowel is also seen within the anterior aspect of the lower abdomen. Appendix appears normal. No evidence of bowel wall thickening, distention, or inflammatory changes. Vascular/Lymphatic: Aortic atherosclerosis. No enlarged abdominal or pelvic lymph nodes. Reproductive: Prostate is unremarkable. Other: No abdominal wall hernia or abnormality. No abdominopelvic ascites. Musculoskeletal: No acute or significant osseous findings. IMPRESSION: 1. Moderate severity left lower lobe infiltrate with mild right basilar atelectasis and/or infiltrate. 2. Findings consistent with gastric outlet obstruction, at the site of surgical anastomosis of the patient's prior gastrojejunostomy. 3. Postoperative changes within the gastric region and anterior aspect of the lower abdomen. 4. Aortic atherosclerosis. Aortic Atherosclerosis (ICD10-I70.0). Electronically Signed   By: TVirgina NorfolkM.D.   On: 04/30/2022 03:35     Assessment & Plan:  DAbbott Jasinskiis a 36y.o. male with a GOO at the [facility]. I think it is likely related to ulcer disease given the prior ulcers, smoking and NSAID use. He has been placed on Protonix.   -Will get EGD to assess if  stricture and fibrotic that can be dilated or active ulcer with edema that needs to be cooled off -Hopefully if this is strictured dilation /serial dilation will resolve the issue  -He is not a great operative candidate given all of his drug use but if this has to be revised he will likely need an antrectomy and vagotomy since he will be non complaint with smoking and NSAIDs.    All questions were answered to the satisfaction of the patient.   LVirl Cagey11/26/2023, 10:36 AM

## 2022-04-30 NOTE — Assessment & Plan Note (Signed)
-  lipase 106 -no inflammation or signs of acute pancreatitis on CT  -Supposed to be on Creon at home, but has not had the prescription filled -Continue to monitor

## 2022-04-30 NOTE — Consult Note (Signed)
Maylon Peppers, M.D. Gastroenterology & Hepatology                                           Patient Name: David Miranda Account #: _0 @   MRN: [id number] Admission Date: 04/29/2022 Date of Evaluation:  04/30/2022 Time of Evaluation: 12:14 PM   Referring Physician: Kathie Dike, MD  Chief Complaint: Abdominal pain and vomiting, concern for gastric outlet obstruction  HPI:  This is a 36 y.o. 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  Gastroenterology was consulted for gastric outlet obstruction concern.  Patient is a poor historian and appears to be slightly somnolent when I interviewed him.  The patient reports that since Wednesday he has presented worsening abdominal pain in his upper abdomen, mostly in the left upper quadrant with very poor oral intake and some vomiting episodes since yesterday which has bilious contents.  He states that he took some ibuprofen within the last week for management of the abdominal pain.  He denies taking NSAIDs very frequently although he reports taking ibuprofen on and off.  He also reports using different drugs frequently.  Last time he used heroin was on Thursday, also reports that he did cocaine and methamphetamines within the last week but does not remember if it was on Wednesday.  He denies any melena, hematochezia, fever, chills, abdominal distention.  Notably, the patient was seen for severe gastrointestinal problems and pain at [facility] in May 2021.  At that time he underwent an EGD on 10/27/2019 which showed severe grade D esophagitis and anastomotic ulceration in the jejunal side without active bleeding.  His case was reviewed by bariatric surgery who considered that his anatomy did not correspond to a regular Roux-en-Y gastric  bypass and it was "a gastric pouch and his jejunum is simply reconnected to his stomach", his symptoms were assumed to be related to peptic ulcer disease versus with some ulceration.  In the ED, he was HD stable and afebrile. Labs were remarkable for leukocytosis with white blood cell count of 24,500, hemoglobin 12.9, platelets 343, CMP with elevation of his creatinine of 1.56, BUN of 30, sodium 134, rest of the electrolytes were within normal limits as well as liver function test.  CT abdomen and pelvis with IV contrast showed findings suggestive of gastric although obstruction with dilated esophagus full of fluid.  There was presence of a left lower lobe infiltrate.  Notably, urine toxicology from today showed positive testing for cocaine, methamphetamines and opiates.  Past Medical History: SEE CHRONIC ISSSUES: Past Medical History:  Diagnosis Date   GERD (gastroesophageal reflux disease)    Pancreatitis    Past Surgical History:  Past Surgical History:  Procedure Laterality Date   BIOPSY  09/01/2019   Procedure: BIOPSY;  Surgeon: Daneil Dolin, MD;  [facility];  Service: Endoscopy;;  Gastric Biopsy for H. Pylori    ESOPHAGOGASTRODUODENOSCOPY (EGD) WITH PROPOFOL N/A 04/01/2019   Procedure: ESOPHAGOGASTRODUODENOSCOPY (EGD) WITH PROPOFOL;  Surgeon: Danie Binder, MD;  [facility]: [facility];  Service: Endoscopy;  Laterality: N/A;   ESOPHAGOGASTRODUODENOSCOPY (EGD) WITH PROPOFOL N/A 09/01/2019   Procedure: ESOPHAGOGASTRODUODENOSCOPY (EGD) WITH PROPOFOL;  Surgeon: Daneil Dolin, MD;  [facility];  Service: Endoscopy;  Laterality: N/A;   GASTROJEJUNOSTOMY N/A 04/04/2019   Procedure: GASTROJEJUNOSTOMY;  Surgeon: Virl Cagey, MD;  Location: [facility];  Service: General;  Laterality: N/A;   GASTROJEJUNOSTOMY  04/14/2019   Procedure: REVISION GASTROJEJUNOSTOMY TO ROUX-EN-Y;  Surgeon: Virl Cagey, MD;  Location: [facility];  Service: General;;   NO PAST  SURGERIES     TESTICLE SURGERY     Family History:  Family History  Problem Relation Age of Onset   Cancer Other    Social History:  Social History   Tobacco Use   Smoking status: Every Day    Packs/day: 1.00    Types: Cigarettes   Smokeless tobacco: Never  Vaping Use   Vaping Use: Never used  Substance Use Topics   Alcohol use: Not Currently    Comment: pint every few days   Drug use: Yes    Types: Marijuana, Cocaine    Comment: occasionally    Home Medications:  Prior to Admission medications   Medication Sig Start Date End Date Taking? Authorizing Provider  acetaminophen (TYLENOL) 325 MG tablet Take 2 tablets (650 mg total) by mouth every 6 (six) hours as needed for mild pain (or Fever >/= 101). 09/02/19   Roxan Hockey, MD  calcium carbonate (TUMS - DOSED IN MG ELEMENTAL CALCIUM) 500 MG chewable tablet Chew 2 tablets (400 mg of elemental calcium total) by mouth daily as needed for indigestion or heartburn. 09/02/19   Roxan Hockey, MD  ferrous sulfate 325 (65 FE) MG EC tablet Take 1 tablet (325 mg total) by mouth 2 (two) times daily with a meal. 09/02/19   Emokpae, Courage, MD  hydrOXYzine (ATARAX/VISTARIL) 25 MG tablet Take 1 tablet (25 mg total) by mouth 2 (two) times daily. 09/02/19   Roxan Hockey, MD  linaclotide Rolan Lipa) 145 MCG CAPS capsule Take 1 capsule (145 mcg total) by mouth daily before breakfast. 09/03/19   Roxan Hockey, MD  lipase/protease/amylase (CREON) 36000 UNITS CPEP capsule Take 2 capsules (72,000 Units total) by mouth 3 (three) times daily with meals. 09/02/19   Roxan Hockey, MD  Multiple Vitamin (MULTIVITAMIN WITH MINERALS) TABS tablet Take 1 tablet by mouth daily. 09/02/19   Roxan Hockey, MD  nicotine (NICODERM CQ - DOSED IN MG/24 HOURS) 14 mg/24hr patch Place 1 patch (14 mg total) onto the skin daily. 09/03/19   Roxan Hockey, MD  omeprazole (PRILOSEC) 40 MG capsule Take 1 capsule (40 mg total) by mouth 2 (two) times daily. 09/02/19    Roxan Hockey, MD  oxyCODONE-acetaminophen (PERCOCET) 5-325 MG tablet Take 1 tablet by mouth every 6 (six) hours as needed. 10/16/20   Milton Ferguson, MD  pantoprazole (PROTONIX) 40 MG tablet Take 1 tablet (40 mg total) by mouth 2 (two) times daily. 10/16/20   Milton Ferguson, MD  polyethylene glycol (MIRALAX / GLYCOLAX) 17 g packet Take 17 g by mouth daily. 09/02/19   Roxan Hockey, MD  sucralfate (CARAFATE) 1 GM/10ML suspension Take 10 mLs (1 g total) by mouth 4 (four) times daily -  with meals and at bedtime. 10/16/20   Milton Ferguson, MD  traZODone (DESYREL) 150 MG tablet Take 1 tablet (150 mg total) by mouth at bedtime. 09/02/19   Roxan Hockey, MD    Inpatient Medications:  Current Facility-Administered Medications:    0.9 %  sodium chloride infusion, , Intravenous, Continuous, Zierle-Ghosh, Asia B, DO, Last Rate: 100 mL/hr at 04/30/22 0619, New Bag at 04/30/22 0619   acetaminophen (TYLENOL) tablet 650 mg, 650 mg, Oral, Q6H PRN **OR**  acetaminophen (TYLENOL) suppository 650 mg, 650 mg, Rectal, Q6H PRN, Zierle-Ghosh, Asia B, DO   albuterol (PROVENTIL) (2.5 MG/3ML) 0.083% nebulizer solution 2.5 mg, 2.5 mg, Nebulization, Q2H PRN, Zierle-Ghosh, Asia B, DO   azithromycin (ZITHROMAX) 500 mg in sodium chloride 0.9 % 250 mL IVPB, 500 mg, Intravenous, Q0600, Zierle-Ghosh, Asia B, DO, Last Rate: 250 mL/hr at 04/30/22 0941, 500 mg at 04/30/22 0941   cefTRIAXone (ROCEPHIN) 1 g in sodium chloride 0.9 % 100 mL IVPB, 1 g, Intravenous, Q0600, Zierle-Ghosh, Asia B, DO, Last Rate: 200 mL/hr at 04/30/22 0658, 1 g at 04/30/22 0658   heparin injection 5,000 Units, 5,000 Units, Subcutaneous, Q8H, Zierle-Ghosh, Asia B, DO, 5,000 Units at 04/30/22 0606   hydrOXYzine (ATARAX) tablet 25 mg, 25 mg, Oral, BID, Zierle-Ghosh, Asia B, DO   LORazepam (ATIVAN) injection 1 mg, 1 mg, Intravenous, Q6H, Zierle-Ghosh, Asia B, DO, 1 mg at 04/30/22 1127   morphine (PF) 4 MG/ML injection 4 mg, 4 mg, Intravenous, Q2H PRN,  Zierle-Ghosh, Asia B, DO, 4 mg at 04/30/22 1128   ondansetron (ZOFRAN) tablet 4 mg, 4 mg, Oral, Q6H PRN **OR** ondansetron (ZOFRAN) injection 4 mg, 4 mg, Intravenous, Q6H PRN, Zierle-Ghosh, Asia B, DO   oxyCODONE (Oxy IR/ROXICODONE) immediate release tablet 5 mg, 5 mg, Oral, Q4H PRN, Zierle-Ghosh, Asia B, DO   [START ON 05/03/2022] pantoprazole (PROTONIX) injection 40 mg, 40 mg, Intravenous, Q12H, Zierle-Ghosh, Asia B, DO   traZODone (DESYREL) tablet 150 mg, 150 mg, Oral, QHS, Zierle-Ghosh, Asia B, DO Allergies: Patient has no known allergies.  Complete Review of Systems: GENERAL: negative for malaise, night sweats HEENT: No changes in hearing or vision, no nose bleeds or other nasal problems. NECK: Negative for lumps, goiter, pain and significant neck swelling RESPIRATORY: Negative for cough, wheezing CARDIOVASCULAR: Negative for chest pain, leg swelling, palpitations, orthopnea GI: SEE HPI MUSCULOSKELETAL: Negative for joint pain or swelling, back pain, and muscle pain. SKIN: Negative for lesions, rash PSYCH: Negative for sleep disturbance, mood disorder and recent psychosocial stressors. HEMATOLOGY Negative for prolonged bleeding, bruising easily, and swollen nodes. ENDOCRINE: Negative for cold or heat intolerance, polyuria, polydipsia and goiter. NEURO: negative for tremor, gait imbalance, syncope and seizures. The remainder of the review of systems is noncontributory.  Physical Exam: BP 113/75 (BP Location: Right Arm)   Pulse 74   Temp (!) 97.5 F (36.4 C) (Axillary)   Resp 14   Ht 6' (1.829 m)   Wt 79.4 kg   SpO2 95%   BMI 23.73 kg/m  GENERAL: The patient is AO x3, in no acute distress. HEENT: Head is normocephalic and atraumatic. EOMI are intact. Mouth is well hydrated and without lesions. NECK: Supple. No masses LUNGS: Clear to auscultation. No presence of rhonchi/wheezing/rales. Adequate chest expansion HEART: RRR, normal s1 and s2. ABDOMEN: tender in LUQ, no guarding,  no peritoneal signs, and nondistended. BS +. No masses. EXTREMITIES: Without any cyanosis, clubbing, rash, lesions or edema. NEUROLOGIC: AOx3, no focal motor deficit. SKIN: no jaundice, no rashes  Laboratory Data CBC:     Component Value Date/Time   WBC 24.5 (H) 04/30/2022 0215   RBC 5.88 (H) 04/30/2022 0215   HGB 12.9 (L) 04/30/2022 0215   HCT 41.8 04/30/2022 0215   PLT 343 04/30/2022 0215   MCV 71.1 (L) 04/30/2022 0215   MCH 21.9 (L) 04/30/2022 0215   MCHC 30.9 04/30/2022 0215   RDW 18.6 (H) 04/30/2022 0215   LYMPHSABS 0.6 (L) 04/30/2022 0215   MONOABS 0.9 04/30/2022 0215  EOSABS 0.0 04/30/2022 0215   BASOSABS 0.1 04/30/2022 0215   COAG: No results found for: "INR", "PROTIME"  BMP:     Latest Ref Rng & Units 04/30/2022    2:14 AM 10/16/2020    8:23 AM 09/25/2019    2:57 AM  BMP  Glucose 70 - 99 mg/dL 117  136  127   BUN 6 - 20 mg/dL _0 Creatinine 0.61 - 1.24 mg/dL 1.56  1.14  0.68   Sodium 135 - 145 mmol/L 134  138  138   Potassium 3.5 - 5.1 mmol/L 3.7  4.2  3.4   Chloride 98 - 111 mmol/L 95  103  105   CO2 22 - 32 mmol/L _1 Calcium 8.9 - 10.3 mg/dL 9.2  10.3  8.8     HEPATIC:     Latest Ref Rng & Units 04/30/2022    2:14 AM 10/16/2020    8:23 AM 09/25/2019    2:57 AM  Hepatic Function  Total Protein 6.5 - 8.1 g/dL 7.9  7.4  7.1   Albumin 3.5 - 5.0 g/dL 4.4  4.2  3.7   AST 15 - 41 U/L _2 ALT 0 - 44 U/L _3 Alk Phosphatase 38 - 126 U/L 80  89  72   Total Bilirubin 0.3 - 1.2 mg/dL 0.3  0.3  <0.1     CARDIAC: No results found for: "CKTOTAL", "CKMB", "CKMBINDEX", "TROPONINI"   Imaging: I personally reviewed and interpreted the available imaging.  Assessment & Plan: David Miranda is a 37 y.o. 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  Gastroenterology was consulted for gastric outlet obstruction.  The patient is presenting with clinical and imaging findings consistent with gastric outlet obstruction.  This is similar to episodes that he has had in the past which may have been exacerbated by the fact he is using NSAIDs, actively smoking 1 pack a day and using illicit drugs such as cocaine and methamphetamines.  I had a thorough discussion with the patient regarding this and the need to avoid the substances to avoid any recurrence in the future.  At this point, he will need to have decompression with NG tube which he is agreeable to proceed with.  We will also need to proceed with an endoscopic evaluation, the timing of this will depend on discussion with anesthesia as he has tested positive for methamphetamines and cocaine.  Would recommend PPI twice daily for now.  - Repeat CBC qday, transfuse if Hb <7 - Pantoprazole 40 mg q12h IVP - Keep NPO - NGT with low intermittent suction - Avoid NSAIDs - Smoking and illicit drug cessation - Will need to eventually undergo EGD evaluation, will need to discuss with anesthesia timing of this procedure  Maylon Peppers, MD Gastroenterology and [facility]

## 2022-04-30 NOTE — ED Notes (Signed)
Pt to CT

## 2022-04-30 NOTE — Assessment & Plan Note (Signed)
-  Uses meth and heroin -TOC consult -Continue to monitor

## 2022-04-30 NOTE — ED Triage Notes (Signed)
[facility] from home. Cc of left abdominal pain and vomiting for a couple days. Has been taking protonix without relief. Hx of ulcers.

## 2022-04-30 NOTE — H&P (Signed)
History and Physical    Patient: David Miranda DOB: 09-Jun-1985 DOA: 04/29/2022 DOS: the patient was seen and examined on 04/30/2022 PCP: Pcp, No  Patient coming from: Home  Chief Complaint:  Chief Complaint  Patient presents with   Abdominal Pain   HPI: David Miranda is a 36 y.o. male with medical history significant of ulcers, GERD, Roux-en-Y, and more presents to ED with a chief complaint of abdominal pain.  This started Thursday before Thanksgiving dinner.  It was sudden in onset and has been progressively worse.  Patient reports before Thursday he was having abdominal pain intermittently, but since Thursday it has come on and not gone away.  He reports when he was having intermittently was about a week prior to Thanksgiving.  Patient describes the pain as feeling like a muscle soreness.  He took 400 mg ibuprofen with no relief.  He reports his appetite has been intact, but then he reports his last normal meal was on Thursday.  His last normal bowel movement was also on Thursday.  Patient reports some heartburn associated as well.  He has had nausea and vomiting up to 4 times per day.  He reports that it is dark, but he has been drinking cola so is not sure if it is coffee-ground or not.  He has not seen any bright red blood.  Patient has not had a fever.  Patient reports nothing makes his pain better, nothing makes it worse.  He has no other complaints at this time.  Patient does smoke a pack per day.  He does not drink alcohol.  He uses heroin and meth and his last use was Thanksgiving day.  He is not vaccinated for COVID.  Patient is full code. Review of Systems: As mentioned in the history of present illness. All other systems reviewed and are negative. Past Medical History:  Diagnosis Date   GERD (gastroesophageal reflux disease)    Pancreatitis    Past Surgical History:  Procedure Laterality Date   BIOPSY  09/01/2019   Procedure: BIOPSY;  Surgeon: Daneil Dolin, MD;   [facility];  Service: Endoscopy;;  Gastric Biopsy for H. Pylori    ESOPHAGOGASTRODUODENOSCOPY (EGD) WITH PROPOFOL N/A 04/01/2019   Procedure: ESOPHAGOGASTRODUODENOSCOPY (EGD) WITH PROPOFOL;  Surgeon: Danie Binder, MD;  [facility];  Service: Endoscopy;  Laterality: N/A;   ESOPHAGOGASTRODUODENOSCOPY (EGD) WITH PROPOFOL N/A 09/01/2019   Procedure: ESOPHAGOGASTRODUODENOSCOPY (EGD) WITH PROPOFOL;  Surgeon: Daneil Dolin, MD;  [facility];  Service: Endoscopy;  Laterality: N/A;   GASTROJEJUNOSTOMY N/A 04/04/2019   Procedure: GASTROJEJUNOSTOMY;  Surgeon: Virl Cagey, MD;  Location: [facility];  Service: General;  Laterality: N/A;   GASTROJEJUNOSTOMY  04/14/2019   Procedure: REVISION GASTROJEJUNOSTOMY TO ROUX-EN-Y;  Surgeon: Virl Cagey, MD;  Location: [facility];  Service: General;;   NO PAST SURGERIES     TESTICLE SURGERY     Social History:  reports that he has been smoking cigarettes. He has been smoking an average of 1 pack per day. He has never used smokeless tobacco. He reports that he does not currently use alcohol. He reports current drug use. Drugs: Marijuana and Cocaine.  No Known Allergies  Family History  Problem Relation Age of Onset   Cancer Other     Prior to Admission medications   Medication Sig Start Date End Date Taking? Authorizing Provider  acetaminophen (TYLENOL) 325 MG tablet Take 2 tablets (650 mg total) by mouth every 6 (  six) hours as needed for mild pain (or Fever >/= 101). 09/02/19   Roxan Hockey, MD  calcium carbonate (TUMS - DOSED IN MG ELEMENTAL CALCIUM) 500 MG chewable tablet Chew 2 tablets (400 mg of elemental calcium total) by mouth daily as needed for indigestion or heartburn. 09/02/19   Roxan Hockey, MD  ferrous sulfate 325 (65 FE) MG EC tablet Take 1 tablet (325 mg total) by mouth 2 (two) times daily with a meal. 09/02/19   Emokpae, Courage, MD  hydrOXYzine (ATARAX/VISTARIL) 25 MG tablet Take 1 tablet (25 mg  total) by mouth 2 (two) times daily. 09/02/19   Roxan Hockey, MD  linaclotide Rolan Lipa) 145 MCG CAPS capsule Take 1 capsule (145 mcg total) by mouth daily before breakfast. 09/03/19   Roxan Hockey, MD  lipase/protease/amylase (CREON) 36000 UNITS CPEP capsule Take 2 capsules (72,000 Units total) by mouth 3 (three) times daily with meals. 09/02/19   Roxan Hockey, MD  Multiple Vitamin (MULTIVITAMIN WITH MINERALS) TABS tablet Take 1 tablet by mouth daily. 09/02/19   Roxan Hockey, MD  nicotine (NICODERM CQ - DOSED IN MG/24 HOURS) 14 mg/24hr patch Place 1 patch (14 mg total) onto the skin daily. 09/03/19   Roxan Hockey, MD  omeprazole (PRILOSEC) 40 MG capsule Take 1 capsule (40 mg total) by mouth 2 (two) times daily. 09/02/19   Roxan Hockey, MD  oxyCODONE-acetaminophen (PERCOCET) 5-325 MG tablet Take 1 tablet by mouth every 6 (six) hours as needed. 10/16/20   Milton Ferguson, MD  pantoprazole (PROTONIX) 40 MG tablet Take 1 tablet (40 mg total) by mouth 2 (two) times daily. 10/16/20   Milton Ferguson, MD  polyethylene glycol (MIRALAX / GLYCOLAX) 17 g packet Take 17 g by mouth daily. 09/02/19   Roxan Hockey, MD  sucralfate (CARAFATE) 1 GM/10ML suspension Take 10 mLs (1 g total) by mouth 4 (four) times daily -  with meals and at bedtime. 10/16/20   Milton Ferguson, MD  traZODone (DESYREL) 150 MG tablet Take 1 tablet (150 mg total) by mouth at bedtime. 09/02/19   Roxan Hockey, MD    Physical Exam: Vitals:   04/30/22 0200 04/30/22 0300 04/30/22 0330 04/30/22 0400  BP: 119/86 129/89 127/86 114/78  Pulse: 96 99  90  Resp: '16 17  19  '$ SpO2: 97% 95%  99%  Weight:      Height:       1.  General: Patient lying supine in bed,  no acute distress   2. Psychiatric: Alert and oriented x 3, mood and behavior normal for situation, pleasant and cooperative with exam   3. Neurologic: Speech and language are normal, face is symmetric, moves all 4 extremities voluntarily, at baseline without acute  deficits on limited exam   4. HEENMT:  Head is atraumatic, normocephalic, pupils reactive to light, neck is supple, trachea is midline, mucous membranes are moist   5. Respiratory : Lungs are clear to auscultation bilaterally without wheezing, rhonchi, rales, no cyanosis, no increase in work of breathing or accessory muscle use   6. Cardiovascular : Heart rate normal, rhythm is regular, no murmurs, rubs or gallops, no peripheral edema, peripheral pulses palpated   7. Gastrointestinal:  Abdomen is most tender in the right lower quadrant but also tender in the epigastric region, no palpable masses, soft, no guarding   8. Skin:  Skin is warm, dry and intact without rashes, acute lesions, or ulcers on limited exam   9.Musculoskeletal:  No acute deformities or trauma, no asymmetry in tone, no peripheral  edema, peripheral pulses palpated, no tenderness to palpation in the extremities  Data Reviewed: In the ED Patient afebrile, heart rate 87-99, respiratory rate 16-20, blood pressure 95/72-132/94, satting at 99% Leukocytosis 24.5, hemoglobin 12.9, platelets 343 Chemistry reveals an AKI with a creatinine of 1.56 T abdomen pelvis shows moderate severity left lower lobe infiltrate with mild right basilar atelectasis and/or infiltrate, and findings consistent with gastric outlet obstruction Dr. Constance Haw will see this patient in the a.m. Patient refusing NG tube  Assessment and Plan: * Gastric outlet obstruction -Bridges to see in the AM -Refusing NG tube -NPO except for ice chips -Pain control with pain scale -Continue to monitor  Substance abuse (HCC) -Uses meth and heroin -TOC consult -Continue to monitor  Community-acquired pneumonia With infiltrate on CT scan Expectorated sputum culture, strep and Legionella urine antigens Rocephin and Zithromax  Tobacco use disorder -declines nicotine patch in the AM -Advised on the importance of cessation  Elevated lipase -lipase  106 -no inflammation or signs of acute pancreatitis on CT  -Supposed to be on Creon at home, but has not had the prescription filled -Continue to monitor  AKI (acute kidney injury) ([place]) -Cr. 1.14>> 1.56 -Continue IV fluids -Likely 2/2 poor PO intake given abdominal pain      Advance Care Planning:   Code Status: Prior full  Consults: General surgery  Family Communication: No family at bedside  Severity of Illness: The appropriate patient status for this patient is INPATIENT. Inpatient status is judged to be reasonable and necessary in order to provide the required intensity of service to ensure the patient's safety. The patient's presenting symptoms, physical exam findings, and initial radiographic and laboratory data in the context of their chronic comorbidities is felt to place them at high risk for further clinical deterioration. Furthermore, it is not anticipated that the patient will be medically stable for discharge from the hospital within 2 midnights of admission.   * I certify that at the point of admission it is my clinical judgment that the patient will require inpatient hospital care spanning beyond 2 midnights from the point of admission due to high intensity of service, high risk for further deterioration and high frequency of surveillance required.*  Author: Rolla Plate, DO 04/30/2022 5:22 AM  For on call review [email or web address].

## 2022-04-30 NOTE — Progress Notes (Signed)
Patient is a hospital earlier this morning by Dr. Clearence Ped  Patient seen and examined.  Recently received pain medication so he is somnolent.  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  Kathie Dike

## 2022-04-30 NOTE — ED Notes (Signed)
RN to room to educate on NGT insertion. Pt states he has had one before and would like to be sedated for insertion instead of being awake . Due to bad prior experiences. Pt very anxious and tearful over situation. Advised we will not place tube at this time. Pt calmed down.

## 2022-04-30 NOTE — Progress Notes (Signed)
  Transition of Care [facility]) Screening Note   Patient Details  Name: David Miranda Date of Birth: 1985-09-09   Transition of Care [facility]) CM/SW Contact:    Iona Beard, [place] Phone Number: 04/30/2022, 11:56 AM    Transition of Care Department [facility]) has reviewed patient and no TOC needs have been identified at this time. We will continue to monitor patient advancement through interdisciplinary progression rounds. If new patient transition needs arise, please place a TOC consult.

## 2022-04-30 NOTE — ED Provider Notes (Signed)
[facility] EMERGENCY DEPARTMENT Provider Note   CSN: [id number] Arrival date & time: 04/29/22  2354     History  Chief Complaint  Patient presents with   Abdominal Pain    David Miranda is a 36 y.o. male.  The history is provided by the patient.  Abdominal Pain He has history of pancreatitis, GERD, peptic ulcer disease comes in with 2 days of upper abdominal pain with associated nausea and vomiting.  Emesis has been black.  Pain seems to alternate between the right and left side and does radiate to the back into the lower chest.  He has not had a bowel movement since his pain started, but has been passing flatus.  He denies ethanol use and states that he did use some ibuprofen after pain started but was not using any NSAIDs before pain started.  He is supposed to be taking pantoprazole and states that he is generally compliant with it although he does admit to occasionally missing a dose.   Home Medications Prior to Admission medications   Medication Sig Start Date End Date Taking? Authorizing Provider  acetaminophen (TYLENOL) 325 MG tablet Take 2 tablets (650 mg total) by mouth every 6 (six) hours as needed for mild pain (or Fever >/= 101). 09/02/19   Roxan Hockey, MD  calcium carbonate (TUMS - DOSED IN MG ELEMENTAL CALCIUM) 500 MG chewable tablet Chew 2 tablets (400 mg of elemental calcium total) by mouth daily as needed for indigestion or heartburn. 09/02/19   Roxan Hockey, MD  ferrous sulfate 325 (65 FE) MG EC tablet Take 1 tablet (325 mg total) by mouth 2 (two) times daily with a meal. 09/02/19   Emokpae, Courage, MD  hydrOXYzine (ATARAX/VISTARIL) 25 MG tablet Take 1 tablet (25 mg total) by mouth 2 (two) times daily. 09/02/19   Roxan Hockey, MD  linaclotide Rolan Lipa) 145 MCG CAPS capsule Take 1 capsule (145 mcg total) by mouth daily before breakfast. 09/03/19   Roxan Hockey, MD  lipase/protease/amylase (CREON) 36000 UNITS CPEP capsule Take 2 capsules (72,000 Units total)  by mouth 3 (three) times daily with meals. 09/02/19   Roxan Hockey, MD  Multiple Vitamin (MULTIVITAMIN WITH MINERALS) TABS tablet Take 1 tablet by mouth daily. 09/02/19   Roxan Hockey, MD  nicotine (NICODERM CQ - DOSED IN MG/24 HOURS) 14 mg/24hr patch Place 1 patch (14 mg total) onto the skin daily. 09/03/19   Roxan Hockey, MD  omeprazole (PRILOSEC) 40 MG capsule Take 1 capsule (40 mg total) by mouth 2 (two) times daily. 09/02/19   Roxan Hockey, MD  oxyCODONE-acetaminophen (PERCOCET) 5-325 MG tablet Take 1 tablet by mouth every 6 (six) hours as needed. 10/16/20   Milton Ferguson, MD  pantoprazole (PROTONIX) 40 MG tablet Take 1 tablet (40 mg total) by mouth 2 (two) times daily. 10/16/20   Milton Ferguson, MD  polyethylene glycol (MIRALAX / GLYCOLAX) 17 g packet Take 17 g by mouth daily. 09/02/19   Roxan Hockey, MD  sucralfate (CARAFATE) 1 GM/10ML suspension Take 10 mLs (1 g total) by mouth 4 (four) times daily -  with meals and at bedtime. 10/16/20   Milton Ferguson, MD  traZODone (DESYREL) 150 MG tablet Take 1 tablet (150 mg total) by mouth at bedtime. 09/02/19   Roxan Hockey, MD      Allergies    Patient has no known allergies.    Review of Systems   Review of Systems  Gastrointestinal:  Positive for abdominal pain.  All other systems reviewed and are negative.  Physical Exam Updated Vital Signs BP 95/78   Pulse 89   Resp 20   Ht 6' (1.829 m)   Wt 79.4 kg   SpO2 95%   BMI 23.73 kg/m  Physical Exam Vitals and nursing note reviewed.   36 year old male, resting comfortably and in no acute distress. Vital signs are normal. Oxygen saturation is 95%, which is normal. Head is normocephalic and atraumatic. PERRLA, EOMI. Oropharynx is clear. Neck is nontender and supple without adenopathy or JVD. Back is nontender and there is no CVA tenderness. Lungs are clear without rales, wheezes, or rhonchi. Chest is nontender. Heart has regular rate and rhythm without  murmur. Abdomen is soft, flat, with mild to moderate pain across the upper abdomen.  There is no rebound or guarding.  Peristalsis is hypoactive. Extremities have no cyanosis or edema, full range of motion is present. Skin is warm and dry without rash. Neurologic: Mental status is normal, cranial nerves are intact, moves all extremities equally.  ED Results / Procedures / Treatments   Labs (all labs ordered are listed, but only abnormal results are displayed) Labs Reviewed  LIPASE, BLOOD - Abnormal; Notable for the following components:      Result Value   Lipase 106 (*)    All other components within normal limits  COMPREHENSIVE METABOLIC PANEL - Abnormal; Notable for the following components:   Sodium 134 (*)    Chloride 95 (*)    Glucose, Bld 117 (*)    BUN 30 (*)    Creatinine, Ser 1.56 (*)    GFR, Estimated 59 (*)    All other components within normal limits  CBC WITH DIFFERENTIAL/PLATELET - Abnormal; Notable for the following components:   WBC 24.5 (*)    RBC 5.88 (*)    Hemoglobin 12.9 (*)    MCV 71.1 (*)    MCH 21.9 (*)    RDW 18.6 (*)    Neutro Abs 22.8 (*)    Lymphs Abs 0.6 (*)    Abs Immature Granulocytes 0.14 (*)    All other components within normal limits  URINALYSIS, ROUTINE W REFLEX MICROSCOPIC  TYPE AND SCREEN   Radiology CT ABDOMEN PELVIS W CONTRAST  Result Date: 04/30/2022 CLINICAL DATA:  Epigastric pain. EXAM: CT ABDOMEN AND PELVIS WITH CONTRAST TECHNIQUE: Multidetector CT imaging of the abdomen and pelvis was performed using the standard protocol following bolus administration of intravenous contrast. RADIATION DOSE REDUCTION: This exam was performed according to the departmental dose-optimization program which includes automated exposure control, adjustment of the mA and/or kV according to patient size and/or use of iterative reconstruction technique. CONTRAST:  44m OMNIPAQUE IOHEXOL 300 MG/ML  SOLN COMPARISON:  Oct 16, 2020 FINDINGS: Lower chest:  Moderate severity left lower lobe infiltrate is seen. Mild right basilar atelectasis and/or infiltrate is also noted. Hepatobiliary: No focal liver abnormality is seen. No gallstones, gallbladder wall thickening, or biliary dilatation. Pancreas: Unremarkable. No pancreatic ductal dilatation or surrounding inflammatory changes. Spleen: Normal in size without focal abnormality. Adrenals/Urinary Tract: Adrenal glands are unremarkable. Kidneys are normal, without renal calculi, focal lesion, or hydronephrosis. Bladder is unremarkable. Stomach/Bowel: The visualized portion of the distal esophagus is dilated and fluid-filled (measures approximately 3.0 cm x 2.5 cm). Marked severity gastric distension is seen. Surgical sutures are seen within the gastric region, with surgically anastomosed bowel is also seen within the anterior aspect of the lower abdomen. Appendix appears normal. No evidence of bowel wall thickening, distention, or inflammatory changes. Vascular/Lymphatic: Aortic atherosclerosis. No  enlarged abdominal or pelvic lymph nodes. Reproductive: Prostate is unremarkable. Other: No abdominal wall hernia or abnormality. No abdominopelvic ascites. Musculoskeletal: No acute or significant osseous findings. IMPRESSION: 1. Moderate severity left lower lobe infiltrate with mild right basilar atelectasis and/or infiltrate. 2. Findings consistent with gastric outlet obstruction, at the site of surgical anastomosis of the patient's prior gastrojejunostomy. 3. Postoperative changes within the gastric region and anterior aspect of the lower abdomen. 4. Aortic atherosclerosis. Aortic Atherosclerosis (ICD10-I70.0). Electronically Signed   By: Virgina Norfolk M.D.   On: 04/30/2022 03:35    Procedures Procedures    Medications Ordered in ED Medications  ondansetron (ZOFRAN) injection 4 mg (has no administration in time range)    ED Course/ Medical Decision Making/ A&P                           Medical Decision  Making Amount and/or Complexity of Data Reviewed Labs: ordered. Radiology: ordered.  Risk Prescription drug management. Decision regarding hospitalization.   Abdominal pain with possible hematemesis in the setting of known history of peptic ulcer disease.  I have ordered screening labs of CBC, comprehensive metabolic panel, lipase and I have ordered a CT of abdomen and pelvis.  I have also ordered IV fluids, pantoprazole, ondansetron.  I reviewed his old records, and on 05/03/2021 he was admitted to another hospital because of upper abdominal pain and found to have erosive gastritis and ulcers.  Differential diagnosis today includes, but is not limited to, gastric ulcer, GERD, upper GI bleed, pancreatitis, cholecystitis, diverticulitis.  I have reviewed and interpreted his laboratory test, and my interpretation is marked leukocytosis which is felt to be stress related, mild anemia, mild hyponatremia which is not felt to be clinically significant, mildly elevated random glucose level, elevated BUN and creatinine suggesting an acute kidney injury would likely secondary to dehydration.  Lipase is also mildly elevated.  CT scan shows gastric outlet obstruction at the site of surgical anastomosis with patient's prior gastrojejunostomy.  I have independently viewed the images and I have also independently discussed the findings with the radiologist.  I agree with radiologist's interpretation.  I have discussed case with Dr. Constance Haw, on-call for general surgery, who states that she will see the patient in consultation but request hospitalist admission.  I discussed the case with Dr. Clearence Ped of [facility] hospitalist who agrees to admit the patient.  I have ordered morphine for pain.  I did order a nasogastric tube, but patient has refused this.  Final Clinical Impression(s) / ED Diagnoses Final diagnoses:  Gastric outflow obstruction  Acute kidney injury (nontraumatic) (HCC)  Hyponatremia  Elevated  random blood glucose level  Elevated lipase    Rx / DC Orders ED Discharge Orders     None         Delora Fuel, MD 56/43/32 [phone]

## 2022-05-01 DIAGNOSIS — N179 Acute kidney failure, unspecified: Secondary | ICD-10-CM | POA: Diagnosis not present

## 2022-05-01 DIAGNOSIS — J189 Pneumonia, unspecified organism: Secondary | ICD-10-CM | POA: Diagnosis not present

## 2022-05-01 DIAGNOSIS — F1910 Other psychoactive substance abuse, uncomplicated: Secondary | ICD-10-CM | POA: Diagnosis not present

## 2022-05-01 DIAGNOSIS — K311 Adult hypertrophic pyloric stenosis: Secondary | ICD-10-CM | POA: Diagnosis not present

## 2022-05-01 LAB — LEGIONELLA PNEUMOPHILA SEROGP 1 UR AG: L. pneumophila Serogp 1 Ur Ag: NEGATIVE

## 2022-05-01 LAB — COMPREHENSIVE METABOLIC PANEL
ALT: 14 U/L (ref 0–44)
AST: 19 U/L (ref 15–41)
Albumin: 3.1 g/dL — ABNORMAL LOW (ref 3.5–5.0)
Alkaline Phosphatase: 60 U/L (ref 38–126)
Anion gap: 7 (ref 5–15)
BUN: 13 mg/dL (ref 6–20)
CO2: 25 mmol/L (ref 22–32)
Calcium: 8.2 mg/dL — ABNORMAL LOW (ref 8.9–10.3)
Chloride: 105 mmol/L (ref 98–111)
Creatinine, Ser: 0.82 mg/dL (ref 0.61–1.24)
GFR, Estimated: >60 mL/min (ref >60)
Glucose, Bld: 79 mg/dL (ref 70–99)
Potassium: 3 mmol/L — ABNORMAL LOW (ref 3.5–5.1)
Sodium: 137 mmol/L (ref 135–145)
Total Bilirubin: 0.6 mg/dL (ref 0.3–1.2)
Total Protein: 5.9 g/dL — ABNORMAL LOW (ref 6.5–8.1)

## 2022-05-01 LAB — PHOSPHORUS: Phosphorus: 2.3 mg/dL — ABNORMAL LOW (ref 2.5–4.6)

## 2022-05-01 LAB — MAGNESIUM: Magnesium: 1.7 mg/dL (ref 1.7–2.4)

## 2022-05-01 LAB — TSH: TSH: 0.583 u[IU]/mL (ref 0.350–4.500)

## 2022-05-01 MED ORDER — POTASSIUM CHLORIDE IN NACL 40-0.9 MEQ/L-% IV SOLN: INTRAVENOUS | Status: DC

## 2022-05-01 MED ORDER — POTASSIUM PHOSPHATES 15 MMOLE/5ML IV SOLN
30.0000 mmol | Freq: Once | INTRAVENOUS | Status: AC
  Administered 2022-05-01: 30 mmol via INTRAVENOUS
  Filled 2022-05-01: qty 10

## 2022-05-01 MED ORDER — HEPARIN SODIUM (PORCINE) 5000 UNIT/ML IJ SOLN
5000.0000 [IU] | Freq: Three times a day (TID) | INTRAMUSCULAR | Status: DC
  Filled 2022-05-01 (×3): qty 1

## 2022-05-01 MED ORDER — MAGNESIUM SULFATE 2 GM/50ML IV SOLN
2.0000 g | Freq: Once | INTRAVENOUS | Status: AC
  Administered 2022-05-01: 2 g via INTRAVENOUS
  Filled 2022-05-01: qty 50

## 2022-05-01 MED ORDER — MORPHINE SULFATE (PF) 2 MG/ML IV SOLN
2.0000 mg | INTRAVENOUS | Status: DC | PRN
  Administered 2022-05-01 – 2022-05-03 (×13): 2 mg via INTRAVENOUS
  Filled 2022-05-01 (×13): qty 1

## 2022-05-01 MED ORDER — PANTOPRAZOLE SODIUM 40 MG IV SOLR
40.0000 mg | Freq: Two times a day (BID) | INTRAVENOUS | Status: DC
  Administered 2022-05-01 – 2022-05-03 (×5): 40 mg via INTRAVENOUS
  Filled 2022-05-01 (×6): qty 10

## 2022-05-01 MED ORDER — LORAZEPAM 2 MG/ML IJ SOLN
1.0000 mg | Freq: Four times a day (QID) | INTRAMUSCULAR | Status: DC | PRN
  Administered 2022-05-01: 1 mg via INTRAVENOUS
  Filled 2022-05-01: qty 1

## 2022-05-01 NOTE — Plan of Care (Signed)
  Problem: Education: Goal: Knowledge of General Education information will improve Description: Including pain rating scale, medication(s)/side effects and non-pharmacologic comfort measures Outcome: Progressing   Problem: Health Behavior/Discharge Planning: Goal: Ability to manage health-related needs will improve Outcome: Progressing   Problem: Clinical Measurements: Goal: Ability to maintain clinical measurements within normal limits will improve Outcome: Progressing Goal: Will remain free from infection Outcome: Progressing Goal: Diagnostic test results will improve Outcome: Progressing Goal: Respiratory complications will improve Outcome: Progressing Goal: Cardiovascular complication will be avoided Outcome: Progressing   Problem: Activity: Goal: Risk for activity intolerance will decrease Outcome: Progressing   Problem: Coping: Goal: Level of anxiety will decrease Outcome: Progressing   Problem: Elimination: Goal: Will not experience complications related to bowel motility Outcome: Progressing

## 2022-05-01 NOTE — Progress Notes (Signed)
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  This was occurring intermittently, but since Thanksgiving it has been constant.  He has been taking NSAIDs without relief.  CT of the abdomen pelvis showed findings consistent with gastric bowel obstruction.  He was admitted for GI and general surgery evaluation.   Assessment & Plan:   Principal Problem:   Gastric outlet obstruction Active Problems:   AKI (acute kidney injury) (HCC)   Elevated lipase   Tobacco use disorder   Substance abuse ([place])   CAP (community acquired pneumonia)   Gastric outlet obstruction -Appreciate GI and general surgery input -NG tube initially placed for decompression, but removed overnight 11/26.  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  This procedure will potentially be done on 11/28 -Continue PPI twice daily   Community acquired pneumonia -Infiltrate noted on CT scan -On antibiotics with ceftriaxone and azithromycin -Continue current treatments   Polysubstance abuse -Admits to methamphetamine use and heroin -Urine toxicology screen grossly positive for opiates, cocaine, amphetamines, THC   AKI -Creatinine 1.5 on admission -Secondary to dehydration -Improved with IV fluids, creatinine currently 0.8  Microcytic anemia -Hemoglobin currently 9.8 -Suspect further downtrend with IV hydration since he is likely still hemoconcentrated -Check iron studies  Hypokalemia -Replace   Elevated lipase -No evidence of pancreatitis on CT -May have more chronic finding -Home meds to include Creon,  will resume when able to take p.o.   DVT prophylaxis: heparin injection 5,000 Units Start: 05/02/22 1800 heparin injection 5,000 Units Start: 04/30/22 0630 SCDs Start: 04/30/22 0542  Code Status: Full code Family Communication: No family present Disposition Plan: Status is: Inpatient Remains inpatient appropriate because: Awaiting endoscopic evaluation for GOO     Consultants:  GI General surgery  Procedures:    Antimicrobials:  Ceftriaxone 11/26 > Azithromycin 11/26 >   Subjective: Sleeping on my arrival.  He does wake up to voice.  Denies any pain at this time.  No nausea.  Denies any shortness of breath or cough.  Patient had removed NG tube overnight.  Attempts were made to replace it this morning, but were unsuccessful.  Objective: Vitals:   04/30/22 2137 05/01/22 0517 05/01/22 1501 05/01/22 1508  BP: 101/69 114/75 109/76 106/61  Pulse: 67 78 77 74  Resp: '17 18 12   '$ Temp: 98.2 F (36.8 C) 98.3 F (36.8 C) 98.3 F (36.8 C)   TempSrc: Oral Oral Oral   SpO2: 97% 94% (!) 77% 97%  Weight:      Height:        Intake/Output Summary (Last 24 hours) at 05/01/2022 1952 Last data filed at 05/01/2022 1554 Gross per 24 hour  Intake 630.42 ml  Output --  Net 630.42 ml   Filed Weights   04/30/22 0003  Weight: 79.4 kg    Examination:  General exam: Appears calm and comfortable  Respiratory system: Clear to auscultation. Respiratory effort normal. Cardiovascular system: S1 & S2 heard, RRR. No JVD, murmurs, rubs, gallops or clicks. No pedal edema. Gastrointestinal system: Abdomen is nondistended, soft and nontender. No organomegaly or masses felt. Normal bowel sounds heard. Central nervous system: Somnolent, but will  wake up to voice.  No focal neurological deficits. Extremities: Symmetric 5 x 5 power. Skin: No rashes, lesions or ulcers Psychiatry: Somnolent, but will wake up to voice.  Answers yes and no, but does not really engage in conversation    Data  Reviewed: I have personally reviewed following labs and imaging studies  CBC: Recent Labs  Lab 04/30/22 0215 05/01/22 0424  WBC 24.5* 13.8*  NEUTROABS 22.8* 10.7*  HGB 12.9* 9.8*  HCT 41.8 31.0*  MCV 71.1* 70.5*  PLT 343 595   Basic Metabolic Panel: Recent Labs  Lab 04/30/22 0214 05/01/22 0424  NA 134* 137  K 3.7 3.0*  CL 95* 105  CO2 25 25  GLUCOSE 117* 79  BUN 30* 13  CREATININE 1.56* 0.82  CALCIUM 9.2 8.2*  MG  --  1.7  PHOS  --  2.3*   GFR: Estimated Creatinine Clearance: 138 mL/min (by C-G formula based on SCr of 0.82 mg/dL). Liver Function Tests: Recent Labs  Lab 04/30/22 0214 05/01/22 0424  AST 21 19  ALT 20 14  ALKPHOS 80 60  BILITOT 0.3 0.6  PROT 7.9 5.9*  ALBUMIN 4.4 3.1*   Recent Labs  Lab 04/30/22 0214  LIPASE 106*   No results for input(s): "AMMONIA" in the last 168 hours. Coagulation Profile: No results for input(s): "INR", "PROTIME" in the last 168 hours. Cardiac Enzymes: No results for input(s): "CKTOTAL", "CKMB", "CKMBINDEX", "TROPONINI" in the last 168 hours. BNP (last 3 results) No results for input(s): "PROBNP" in the last 8760 hours. HbA1C: No results for input(s): "HGBA1C" in the last 72 hours. CBG: No results for input(s): "GLUCAP" in the last 168 hours. Lipid Profile: No results for input(s): "CHOL", "HDL", "LDLCALC", "TRIG", "CHOLHDL", "LDLDIRECT" in the last 72 hours. Thyroid Function Tests: Recent Labs    05/01/22 0425  TSH 0.583   Anemia Panel: No results for input(s): "VITAMINB12", "FOLATE", "FERRITIN", "TIBC", "IRON", "RETICCTPCT" in the last 72 hours. Sepsis Labs: No results for input(s): "PROCALCITON", "LATICACIDVEN" in the last 168 hours.  Recent Results (from the past 240 hour(s))  Culture, blood (Routine X 2) w Reflex to ID Panel     Status: None (Preliminary result)   Collection Time: 04/30/22  6:22 AM   Specimen: Right Antecubital; Blood  Result Value Ref Range Status   Specimen Description   Final     RIGHT ANTECUBITAL BOTTLES DRAWN AEROBIC AND ANAEROBIC   Special Requests   Final    Blood Culture results may not be optimal due to an excessive volume of blood received in culture bottles   Culture   Final    NO GROWTH < 24 HOURS Performed at [facility], [address], [place], [place] [address]    Report Status PENDING  Incomplete  Culture, blood (Routine X 2) w Reflex to ID Panel     Status: None (Preliminary result)   Collection Time: 04/30/22  6:36 AM   Specimen: BLOOD RIGHT HAND  Result Value Ref Range Status   Specimen Description   Final    BLOOD RIGHT HAND BOTTLES DRAWN AEROBIC AND ANAEROBIC   Special Requests   Final    Blood Culture results may not be optimal due to an excessive volume of blood received in culture bottles   Culture   Final    NO GROWTH < 24 HOURS Performed at [facility], [address] [place],  [address]    Report Status PENDING  Incomplete  Radiology Studies: DG Abd 1 View  Result Date: 04/30/2022 CLINICAL DATA:  NG tube placement. EXAM: ABDOMEN - 1 VIEW COMPARISON:  04/14/2019 FINDINGS: Enteric tube noted with tip overlying the distal stomach. No dilated bowel loops are present. IMPRESSION: Enteric tube with tip overlying the distal stomach. Electronically Signed   By: Margarette Canada M.D.   On: 04/30/2022 12:26   CT ABDOMEN PELVIS W CONTRAST  Result Date: 04/30/2022 CLINICAL DATA:  Epigastric pain. EXAM: CT ABDOMEN AND PELVIS WITH CONTRAST TECHNIQUE: Multidetector CT imaging of the abdomen and pelvis was performed using the standard protocol following bolus administration of intravenous contrast. RADIATION DOSE REDUCTION: This exam was performed according to the departmental dose-optimization program which includes automated exposure control, adjustment of the mA and/or kV according to patient size and/or use of iterative reconstruction technique. CONTRAST:  83m OMNIPAQUE IOHEXOL 300 MG/ML  SOLN COMPARISON:  Oct 16, 2020 FINDINGS:  Lower chest: Moderate severity left lower lobe infiltrate is seen. Mild right basilar atelectasis and/or infiltrate is also noted. Hepatobiliary: No focal liver abnormality is seen. No gallstones, gallbladder wall thickening, or biliary dilatation. Pancreas: Unremarkable. No pancreatic ductal dilatation or surrounding inflammatory changes. Spleen: Normal in size without focal abnormality. Adrenals/Urinary Tract: Adrenal glands are unremarkable. Kidneys are normal, without renal calculi, focal lesion, or hydronephrosis. Bladder is unremarkable. Stomach/Bowel: The visualized portion of the distal esophagus is dilated and fluid-filled (measures approximately 3.0 cm x 2.5 cm). Marked severity gastric distension is seen. Surgical sutures are seen within the gastric region, with surgically anastomosed bowel is also seen within the anterior aspect of the lower abdomen. Appendix appears normal. No evidence of bowel wall thickening, distention, or inflammatory changes. Vascular/Lymphatic: Aortic atherosclerosis. No enlarged abdominal or pelvic lymph nodes. Reproductive: Prostate is unremarkable. Other: No abdominal wall hernia or abnormality. No abdominopelvic ascites. Musculoskeletal: No acute or significant osseous findings. IMPRESSION: 1. Moderate severity left lower lobe infiltrate with mild right basilar atelectasis and/or infiltrate. 2. Findings consistent with gastric outlet obstruction, at the site of surgical anastomosis of the patient's prior gastrojejunostomy. 3. Postoperative changes within the gastric region and anterior aspect of the lower abdomen. 4. Aortic atherosclerosis. Aortic Atherosclerosis (ICD10-I70.0). Electronically Signed   By: TVirgina NorfolkM.D.   On: 04/30/2022 03:35        Scheduled Meds:  heparin  5,000 Units Subcutaneous Q8H   [START ON 05/02/2022] heparin injection (subcutaneous)  5,000 Units Subcutaneous Q8H   hydrOXYzine  25 mg Oral BID   pantoprazole  40 mg Intravenous Q12H    traZODone  150 mg Oral QHS   Continuous Infusions:  0.9 % NaCl with KCl 40 mEq / L     azithromycin Stopped (05/01/22 0757)   cefTRIAXone (ROCEPHIN)  IV Stopped (05/01/22 0600)   magnesium sulfate bolus IVPB       LOS: 1 day    Time spent: 352ms    JeKathie DikeMD [facility]   If 7PM-7AM, please contact night-coverage [email or web address]  05/01/2022, 7:52 PM

## 2022-05-01 NOTE — Progress Notes (Signed)
pt's NG tube came out during previous shift, this nurse attempted x2 in L nares, pt unable to tolerate and states he wishes to be numbed or "put to sleep" in order for treatment team to attempt any further, MD made aware as well as assigned nurse.

## 2022-05-01 NOTE — Progress Notes (Signed)
Overnight, pt pulled out ng tube in his sleep. Attempted to replace. Met resistance in the right nare, patient insisted on stopping insertion attempt. Patient tearful asked for pain med. Informed him he already got the pain med but he could have scheduled Ativan. Patient was sleeping by the time RN returned to pt room and did not administer ativan.  RN held sedating meds overnight, because patient slept and did not ask for pain meds, but was observed moving his hands in the air in his sleep. He was able to wake and answer questions throughout the shift, but would fall back to sleep immediately.

## 2022-05-01 NOTE — Progress Notes (Signed)
EKG performed earlier today. EKG not crossing over. Hard copy is in pt's chart.

## 2022-05-01 NOTE — Progress Notes (Addendum)
Gastroenterology Progress Note    Primary Gastroenterologist:  previously Dr. Oneida Alar but dismissed from [facility].   Patient ID: David Miranda; [id number]; 1985-12-02    Subjective   Abdominal pain improved. Nausea reported but states it feels like the type of nauseated feeling when hungry. No diarrhea/constipation. No overt GI bleeding. NGT pulled out overnight during sleep. Unable to be replaced.   Objective   Vital signs in last 24 hours Temp:  [98.2 F (36.8 C)-98.7 F (37.1 C)] 98.3 F (36.8 C) (11/27 0517) Pulse Rate:  [67-78] 78 (11/27 0517) Resp:  [17-18] 18 (11/27 0517) BP: (99-114)/(69-75) 114/75 (11/27 0517) SpO2:  [94 %-100 %] 94 % (11/27 0517) Last BM Date : 04/28/22  Physical Exam General:   Alert and oriented, pleasant, chronically ill-appearing Head:  Normocephalic and atraumatic. Abdomen:  Bowel sounds present, soft, non-tender, non-distended. No HSM or hernias noted. No rebound or guarding. No masses appreciated  Extremities:  Without edema. Neurologic:  Alert and  oriented x4 Psych:  Alert and cooperative. Normal mood and affect.  Intake/Output from previous day: 11/26 0701 - 11/27 0700 In: 1085.4 [I.V.:735.4; IV Piggyback:350] Out: -  Intake/Output this shift: No intake/output data recorded.  Lab Results  Recent Labs    04/30/22 0215 05/01/22 0424  WBC 24.5* 13.8*  HGB 12.9* 9.8*  HCT 41.8 31.0*  PLT 343 315   BMET Recent Labs    04/30/22 0214 05/01/22 0424  NA 134* 137  K 3.7 3.0*  CL 95* 105  CO2 25 25  GLUCOSE 117* 79  BUN 30* 13  CREATININE 1.56* 0.82  CALCIUM 9.2 8.2*   LFT Recent Labs    04/30/22 0214 05/01/22 0424  PROT 7.9 5.9*  ALBUMIN 4.4 3.1*  AST 21 19  ALT 20 14  ALKPHOS 80 60  BILITOT 0.3 0.6    Studies/Results DG Abd 1 View  Result Date: 04/30/2022 CLINICAL DATA:  NG tube placement. EXAM: ABDOMEN - 1 VIEW COMPARISON:  04/14/2019 FINDINGS: Enteric tube noted with tip overlying the distal stomach. No dilated  bowel loops are present. IMPRESSION: Enteric tube with tip overlying the distal stomach. Electronically Signed   By: Margarette Canada M.D.   On: 04/30/2022 12:26   CT ABDOMEN PELVIS W CONTRAST  Result Date: 04/30/2022 CLINICAL DATA:  Epigastric pain. EXAM: CT ABDOMEN AND PELVIS WITH CONTRAST TECHNIQUE: Multidetector CT imaging of the abdomen and pelvis was performed using the standard protocol following bolus administration of intravenous contrast. RADIATION DOSE REDUCTION: This exam was performed according to the departmental dose-optimization program which includes automated exposure control, adjustment of the mA and/or kV according to patient size and/or use of iterative reconstruction technique. CONTRAST:  70m OMNIPAQUE IOHEXOL 300 MG/ML  SOLN COMPARISON:  Oct 16, 2020 FINDINGS: Lower chest: Moderate severity left lower lobe infiltrate is seen. Mild right basilar atelectasis and/or infiltrate is also noted. Hepatobiliary: No focal liver abnormality is seen. No gallstones, gallbladder wall thickening, or biliary dilatation. Pancreas: Unremarkable. No pancreatic ductal dilatation or surrounding inflammatory changes. Spleen: Normal in size without focal abnormality. Adrenals/Urinary Tract: Adrenal glands are unremarkable. Kidneys are normal, without renal calculi, focal lesion, or hydronephrosis. Bladder is unremarkable. Stomach/Bowel: The visualized portion of the distal esophagus is dilated and fluid-filled (measures approximately 3.0 cm x 2.5 cm). Marked severity gastric distension is seen. Surgical sutures are seen within the gastric region, with surgically anastomosed bowel is also seen within the anterior aspect of the lower abdomen. Appendix appears normal. No evidence of bowel wall thickening,  distention, or inflammatory changes. Vascular/Lymphatic: Aortic atherosclerosis. No enlarged abdominal or pelvic lymph nodes. Reproductive: Prostate is unremarkable. Other: No abdominal wall hernia or abnormality. No  abdominopelvic ascites. Musculoskeletal: No acute or significant osseous findings. IMPRESSION: 1. Moderate severity left lower lobe infiltrate with mild right basilar atelectasis and/or infiltrate. 2. Findings consistent with gastric outlet obstruction, at the site of surgical anastomosis of the patient's prior gastrojejunostomy. 3. Postoperative changes within the gastric region and anterior aspect of the lower abdomen. 4. Aortic atherosclerosis. Aortic Atherosclerosis (ICD10-I70.0). Electronically Signed   By: Virgina Norfolk M.D.   On: 04/30/2022 03:35    Assessment  36 y.o. 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  Gastroenterology was consulted for gastric outlet obstruction concern.    Gastric outlet obstruction: CT findings consistent with GOO at site of prior gastrojejunostomy. Positive drug screen this admission (methamphetamines and cocaine). NG tube removed overnight and difficulty replacing per RN notes. Patient declining. No vomiting. Discussed with anesthesia. Will pursue EGD tomorrow. No need for repeat drug screen. Will need EKG. Suspect stricture due to [place] anastomotic ulcer in setting of ongoing NSAIDs, smoking, drug use.   CAP: infiltrate on CT scan. Continue on antibiotics. Stable from respiratory standpoint.     Plan / Recommendations  PPI IV BID NPO Proceed with upper endoscopy by Dr. Jenetta Downer on 11/128. EKG today per anesthesia request Hold morning dose of heparin on 11/28    LOS: 1 day    05/01/2022, 8:13 AM  Annitta Needs, PhD, ANP-BC [facility] Gastroenterology

## 2022-05-01 NOTE — Progress Notes (Signed)
[facility] Progress Note     Subjective: Says he's belly is better. NG unsuccessful.   Objective: Vital signs in last 24 hours: Temp:  [98.2 F (36.8 C)-98.3 F (36.8 C)] 98.3 F (36.8 C) (11/27 0517) Pulse Rate:  [67-78] 78 (11/27 0517) Resp:  [17-18] 18 (11/27 0517) BP: (101-114)/(69-75) 114/75 (11/27 0517) SpO2:  [94 %-97 %] 94 % (11/27 0517) Last BM Date : 04/28/22  Intake/Output from previous day: 11/26 0701 - 11/27 0700 In: 1085.4 [I.V.:735.4; IV Piggyback:350] Out: -  Intake/Output this shift: No intake/output data recorded.  General appearance: alert and no distress GI: soft, nondistended nontender  Lab Results:  Recent Labs    04/30/22 0215 05/01/22 0424  WBC 24.5* 13.8*  HGB 12.9* 9.8*  HCT 41.8 31.0*  PLT 343 315   BMET Recent Labs    04/30/22 0214 05/01/22 0424  NA 134* 137  K 3.7 3.0*  CL 95* 105  CO2 25 25  GLUCOSE 117* 79  BUN 30* 13  CREATININE 1.56* 0.82  CALCIUM 9.2 8.2*   PT/INR No results for input(s): "LABPROT", "INR" in the last 72 hours.  Studies/Results: DG Abd 1 View  Result Date: 04/30/2022 CLINICAL DATA:  NG tube placement. EXAM: ABDOMEN - 1 VIEW COMPARISON:  04/14/2019 FINDINGS: Enteric tube noted with tip overlying the distal stomach. No dilated bowel loops are present. IMPRESSION: Enteric tube with tip overlying the distal stomach. Electronically Signed   By: Margarette Canada M.D.   On: 04/30/2022 12:26   CT ABDOMEN PELVIS W CONTRAST  Result Date: 04/30/2022 CLINICAL DATA:  Epigastric pain. EXAM: CT ABDOMEN AND PELVIS WITH CONTRAST TECHNIQUE: Multidetector CT imaging of the abdomen and pelvis was performed using the standard protocol following bolus administration of intravenous contrast. RADIATION DOSE REDUCTION: This exam was performed according to the departmental dose-optimization program which includes automated exposure control, adjustment of the mA and/or kV according to patient size and/or use of  iterative reconstruction technique. CONTRAST:  77m OMNIPAQUE IOHEXOL 300 MG/ML  SOLN COMPARISON:  Oct 16, 2020 FINDINGS: Lower chest: Moderate severity left lower lobe infiltrate is seen. Mild right basilar atelectasis and/or infiltrate is also noted. Hepatobiliary: No focal liver abnormality is seen. No gallstones, gallbladder wall thickening, or biliary dilatation. Pancreas: Unremarkable. No pancreatic ductal dilatation or surrounding inflammatory changes. Spleen: Normal in size without focal abnormality. Adrenals/Urinary Tract: Adrenal glands are unremarkable. Kidneys are normal, without renal calculi, focal lesion, or hydronephrosis. Bladder is unremarkable. Stomach/Bowel: The visualized portion of the distal esophagus is dilated and fluid-filled (measures approximately 3.0 cm x 2.5 cm). Marked severity gastric distension is seen. Surgical sutures are seen within the gastric region, with surgically anastomosed bowel is also seen within the anterior aspect of the lower abdomen. Appendix appears normal. No evidence of bowel wall thickening, distention, or inflammatory changes. Vascular/Lymphatic: Aortic atherosclerosis. No enlarged abdominal or pelvic lymph nodes. Reproductive: Prostate is unremarkable. Other: No abdominal wall hernia or abnormality. No abdominopelvic ascites. Musculoskeletal: No acute or significant osseous findings. IMPRESSION: 1. Moderate severity left lower lobe infiltrate with mild right basilar atelectasis and/or infiltrate. 2. Findings consistent with gastric outlet obstruction, at the site of surgical anastomosis of the patient's prior gastrojejunostomy. 3. Postoperative changes within the gastric region and anterior aspect of the lower abdomen. 4. Aortic atherosclerosis. Aortic Atherosclerosis (ICD10-I70.0). Electronically Signed   By: TVirgina NorfolkM.D.   On: 04/30/2022 03:35    Anti-infectives: Anti-infectives (From admission, onward)    Start     Dose/Rate Route  Frequency  Ordered Stop   04/30/22 0645  cefTRIAXone (ROCEPHIN) 1 g in sodium chloride 0.9 % 100 mL IVPB        1 g 200 mL/hr over 30 Minutes Intravenous Daily 04/30/22 0615     04/30/22 0645  azithromycin (ZITHROMAX) 500 mg in sodium chloride 0.9 % 250 mL IVPB        500 mg 250 mL/hr over 60 Minutes Intravenous Daily 04/30/22 0615 05/05/22 0559       Assessment/Plan: Patient with GOO at [place] anastomosis likely from stricture or swelling from ulcer disease. Egd tomorrow and hopefully will be able to dilate.  NPO EGD tomorrow    LOS: 1 day    Virl Cagey 05/01/2022

## 2022-05-02 ENCOUNTER — Inpatient Hospital Stay (HOSPITAL_COMMUNITY): Payer: 59 | Admitting: Anesthesiology

## 2022-05-02 ENCOUNTER — Encounter (HOSPITAL_COMMUNITY): Payer: Self-pay | Admitting: Family Medicine

## 2022-05-02 ENCOUNTER — Encounter (HOSPITAL_COMMUNITY)
Admission: EM | Discharge status: Against Medical Advice | Payer: Self-pay | Source: Home / Self Care | Attending: Internal Medicine

## 2022-05-02 DIAGNOSIS — K449 Diaphragmatic hernia without obstruction or gangrene: Secondary | ICD-10-CM

## 2022-05-02 DIAGNOSIS — K2980 Duodenitis without bleeding: Secondary | ICD-10-CM | POA: Diagnosis not present

## 2022-05-02 DIAGNOSIS — K3189 Other diseases of stomach and duodenum: Secondary | ICD-10-CM

## 2022-05-02 DIAGNOSIS — F1910 Other psychoactive substance abuse, uncomplicated: Secondary | ICD-10-CM | POA: Diagnosis not present

## 2022-05-02 DIAGNOSIS — K209 Esophagitis, unspecified without bleeding: Secondary | ICD-10-CM

## 2022-05-02 DIAGNOSIS — N179 Acute kidney failure, unspecified: Secondary | ICD-10-CM | POA: Diagnosis not present

## 2022-05-02 DIAGNOSIS — J189 Pneumonia, unspecified organism: Secondary | ICD-10-CM | POA: Diagnosis not present

## 2022-05-02 DIAGNOSIS — K311 Adult hypertrophic pyloric stenosis: Secondary | ICD-10-CM | POA: Diagnosis not present

## 2022-05-02 DIAGNOSIS — R933 Abnormal findings on diagnostic imaging of other parts of digestive tract: Secondary | ICD-10-CM | POA: Diagnosis not present

## 2022-05-02 HISTORY — PX: ESOPHAGOGASTRODUODENOSCOPY (EGD) WITH PROPOFOL: SHX5813

## 2022-05-02 LAB — RENAL FUNCTION PANEL
Albumin: 3 g/dL — ABNORMAL LOW (ref 3.5–5.0)
Anion gap: 4 — ABNORMAL LOW (ref 5–15)
BUN: 10 mg/dL (ref 6–20)
CO2: 23 mmol/L (ref 22–32)
Calcium: 8.4 mg/dL — ABNORMAL LOW (ref 8.9–10.3)
Chloride: 109 mmol/L (ref 98–111)
Creatinine, Ser: 0.74 mg/dL (ref 0.61–1.24)
GFR, Estimated: >60 mL/min (ref >60)
Glucose, Bld: 179 mg/dL — ABNORMAL HIGH (ref 70–99)
Phosphorus: 2.9 mg/dL (ref 2.5–4.6)
Potassium: 3.5 mmol/L (ref 3.5–5.1)
Sodium: 136 mmol/L (ref 135–145)

## 2022-05-02 LAB — CBC WITH DIFFERENTIAL/PLATELET
Abs Immature Granulocytes: 0.04 10*3/uL (ref 0.00–0.07)
Basophils Absolute: 0.1 10*3/uL (ref 0.0–0.1)
Basophils Relative: 1 %
Eosinophils Absolute: 0.2 10*3/uL (ref 0.0–0.5)
Eosinophils Relative: 1 %
HCT: 31 % — ABNORMAL LOW (ref 39.0–52.0)
Hemoglobin: 9.8 g/dL — ABNORMAL LOW (ref 13.0–17.0)
Immature Granulocytes: 0 %
Lymphocytes Relative: 15 %
Lymphs Abs: 2.1 10*3/uL (ref 0.7–4.0)
MCH: 22.3 pg — ABNORMAL LOW (ref 26.0–34.0)
MCHC: 31.6 g/dL (ref 30.0–36.0)
MCV: 70.5 fL — ABNORMAL LOW (ref 80.0–100.0)
Monocytes Absolute: 0.7 10*3/uL (ref 0.1–1.0)
Monocytes Relative: 5 %
Neutro Abs: 10.7 10*3/uL — ABNORMAL HIGH (ref 1.7–7.7)
Neutrophils Relative %: 78 %
Platelets: 315 10*3/uL (ref 150–400)
RBC: 4.4 MIL/uL (ref 4.22–5.81)
RDW: 18 % — ABNORMAL HIGH (ref 11.5–15.5)
WBC: 13.8 10*3/uL — ABNORMAL HIGH (ref 4.0–10.5)
nRBC: 0 % (ref 0.0–0.2)

## 2022-05-02 LAB — MAGNESIUM: Magnesium: 2.1 mg/dL (ref 1.7–2.4)

## 2022-05-02 LAB — CBC
HCT: 30.4 % — ABNORMAL LOW (ref 39.0–52.0)
Hemoglobin: 9.6 g/dL — ABNORMAL LOW (ref 13.0–17.0)
MCH: 22.2 pg — ABNORMAL LOW (ref 26.0–34.0)
MCHC: 31.6 g/dL (ref 30.0–36.0)
MCV: 70.2 fL — ABNORMAL LOW (ref 80.0–100.0)
Platelets: 353 10*3/uL (ref 150–400)
RBC: 4.33 MIL/uL (ref 4.22–5.81)
RDW: 18 % — ABNORMAL HIGH (ref 11.5–15.5)
WBC: 7 10*3/uL (ref 4.0–10.5)
nRBC: 0 % (ref 0.0–0.2)

## 2022-05-02 LAB — FERRITIN: Ferritin: 14 ng/mL — ABNORMAL LOW (ref 24–336)

## 2022-05-02 LAB — IRON AND TIBC
Iron: 12 ug/dL — ABNORMAL LOW (ref 45–182)
Saturation Ratios: 3 % — ABNORMAL LOW (ref 17.9–39.5)
TIBC: 404 ug/dL (ref 250–450)
UIBC: 392 ug/dL

## 2022-05-02 LAB — GLUCOSE, CAPILLARY: Glucose-Capillary: 100 mg/dL — ABNORMAL HIGH (ref 70–99)

## 2022-05-02 SURGERY — ESOPHAGOGASTRODUODENOSCOPY (EGD) WITH PROPOFOL
Anesthesia: General

## 2022-05-02 MED ORDER — SUCRALFATE 1 GM/10ML PO SUSP
1.0000 g | Freq: Three times a day (TID) | ORAL | Status: DC
  Administered 2022-05-02 – 2022-05-03 (×5): 1 g via ORAL
  Filled 2022-05-02 (×6): qty 10

## 2022-05-02 MED ORDER — LIDOCAINE HCL (PF) 2 % IJ SOLN
INTRAMUSCULAR | Status: AC
  Filled 2022-05-02: qty 10

## 2022-05-02 MED ORDER — SUCCINYLCHOLINE CHLORIDE 200 MG/10ML IV SOSY
PREFILLED_SYRINGE | INTRAVENOUS | Status: DC | PRN
  Administered 2022-05-02: 140 mg via INTRAVENOUS

## 2022-05-02 MED ORDER — SODIUM CHLORIDE 0.9 % IV SOLN: INTRAVENOUS | Status: DC

## 2022-05-02 MED ORDER — SODIUM CHLORIDE 0.9 % IV SOLN
250.0000 mg | Freq: Every day | INTRAVENOUS | Status: DC
  Administered 2022-05-02 – 2022-05-03 (×2): 250 mg via INTRAVENOUS
  Filled 2022-05-02: qty 250
  Filled 2022-05-02 (×2): qty 20

## 2022-05-02 MED ORDER — LACTATED RINGERS IV SOLN
INTRAVENOUS | Status: DC
  Administered 2022-05-02: 1000 mL via INTRAVENOUS

## 2022-05-02 MED ORDER — FENTANYL CITRATE (PF) 250 MCG/5ML IJ SOLN
50.0000 ug | Freq: Once | INTRAMUSCULAR | Status: DC
  Filled 2022-05-02: qty 1

## 2022-05-02 MED ORDER — MIDAZOLAM HCL 2 MG/2ML IJ SOLN
INTRAMUSCULAR | Status: DC | PRN
  Administered 2022-05-02: 2 mg via INTRAVENOUS

## 2022-05-02 MED ORDER — ONDANSETRON HCL 4 MG/2ML IJ SOLN
INTRAMUSCULAR | Status: AC
  Filled 2022-05-02: qty 2

## 2022-05-02 MED ORDER — ALUM & MAG HYDROXIDE-SIMETH 200-200-20 MG/5ML PO SUSP: 30.0000 mL | Freq: Four times a day (QID) | ORAL | Status: DC | PRN

## 2022-05-02 MED ORDER — LIDOCAINE VISCOUS HCL 2 % MT SOLN: 15.0000 mL | Freq: Four times a day (QID) | OROMUCOSAL | Status: DC | PRN

## 2022-05-02 MED ORDER — PROPOFOL 10 MG/ML IV BOLUS
INTRAVENOUS | Status: AC
  Filled 2022-05-02: qty 20

## 2022-05-02 MED ORDER — PROPOFOL 10 MG/ML IV BOLUS
INTRAVENOUS | Status: AC
  Filled 2022-05-02: qty 60

## 2022-05-02 MED ORDER — PROPOFOL 10 MG/ML IV BOLUS
INTRAVENOUS | Status: DC | PRN
  Administered 2022-05-02: 200 mg via INTRAVENOUS

## 2022-05-02 MED ORDER — LIDOCAINE 2% (20 MG/ML) 5 ML SYRINGE
INTRAMUSCULAR | Status: DC | PRN
  Administered 2022-05-02: 100 mg via INTRAVENOUS

## 2022-05-02 MED ORDER — DEXAMETHASONE SODIUM PHOSPHATE 10 MG/ML IJ SOLN
INTRAMUSCULAR | Status: DC | PRN
  Administered 2022-05-02: 4 mg via INTRAVENOUS

## 2022-05-02 MED ORDER — SUCCINYLCHOLINE CHLORIDE 200 MG/10ML IV SOSY
PREFILLED_SYRINGE | INTRAVENOUS | Status: AC
  Filled 2022-05-02: qty 10

## 2022-05-02 MED ORDER — MIDAZOLAM HCL 2 MG/2ML IJ SOLN
INTRAMUSCULAR | Status: AC
  Filled 2022-05-02: qty 2

## 2022-05-02 MED ORDER — FENTANYL CITRATE (PF) 100 MCG/2ML IJ SOLN
INTRAMUSCULAR | Status: DC | PRN
  Administered 2022-05-02: 100 ug via INTRAVENOUS

## 2022-05-02 MED ORDER — FENTANYL CITRATE PF 50 MCG/ML IJ SOSY
PREFILLED_SYRINGE | INTRAMUSCULAR | Status: AC
  Administered 2022-05-02: 50 ug via INTRAVENOUS
  Filled 2022-05-02: qty 1

## 2022-05-02 MED ORDER — FENTANYL CITRATE (PF) 100 MCG/2ML IJ SOLN: 50.0000 ug | Freq: Once | INTRAMUSCULAR | Status: DC

## 2022-05-02 MED ORDER — DEXMEDETOMIDINE HCL IN NACL 80 MCG/20ML IV SOLN
INTRAVENOUS | Status: DC | PRN
  Administered 2022-05-02 (×2): 20 ug via BUCCAL

## 2022-05-02 MED ORDER — ONDANSETRON HCL 4 MG/2ML IJ SOLN
INTRAMUSCULAR | Status: DC | PRN
  Administered 2022-05-02: 4 mg via INTRAVENOUS

## 2022-05-02 MED ORDER — DEXAMETHASONE SODIUM PHOSPHATE 4 MG/ML IJ SOLN
INTRAMUSCULAR | Status: AC
  Filled 2022-05-02: qty 1

## 2022-05-02 NOTE — Brief Op Note (Signed)
04/29/2022 - 05/02/2022  1:02 PM  PATIENT:  David Miranda  36 y.o. male  PRE-OPERATIVE DIAGNOSIS:  gastric outlet obstruction  POST-OPERATIVE DIAGNOSIS:  scar in stomach, ulcer at anastomosis, esophagatitis 36/40, hiatal hernia  PROCEDURE:  Procedure(s) with comments: ESOPHAGOGASTRODUODENOSCOPY (EGD) WITH PROPOFOL (N/A) - with dilation  SURGEON:  Surgeon(s) and Role:    * Harvel Quale, MD - Primary  Patient underwent EGD under GA.  Tolerated the procedure adequately.  Esophagus showed LA Grade D (one or more mucosal breaks involving at least 75% of esophageal circumference) esophagitis with no bleeding was found 38 to 42 cm from the incisors.  A 2 cm hiatal hernia was present. A small amount of food (residue) was found in the gastric body. A 10 mm healed ulcer was found on the greater curvature of the gastric body.  The scar tissue was healthy in appearance. There was presence of a patent pylorus - this was traversed and I was able to examine the post surgical ?duodenal stump, which showed diffuse mild inflammation characterized by congestion and edema. Scope was not advanced past the stump. Evidence of a gastrojejunostomy was found in the posterior wall of the stomach.  This was characterized by erythema, inflammation and ulceration (ulcer measured close to 1 cm, had clean base and was located in the gastric side). Even though there was associated transient narrowing of the gastrojejunostomy due to edema, this was easily traversed with the scope. There was evidence of a widely patent jejunojejunal anastomosis consistent with history of RNY anatomy.  This was characterized by healthy appearing mucosa. No abnormalities were found in the rest of the jejunum.  RECOMMENDATIONS - Return patient to hospital ward for ongoing care.  - Full liquid diet.  - Use Protonix (pantoprazole) 40 mg IV BID, will need to discharge on Zegerid twice a day. - Use sucralfate suspension 1 gram PO QID for 1  month.  - Smoking/methamphetamine/cocaine cessation is imperative. - Repeat EGD in 2 months to assess ulcer improvement.  Maylon Peppers, MD Gastroenterology and Hepatology [facility] Gastroenterology

## 2022-05-02 NOTE — Progress Notes (Signed)
PROGRESS NOTE    David Miranda  HQP:[id number] DOB: December 24, 1985 DOA: 04/29/2022 PCP: Pcp, No    Brief Narrative:  36 year old male with a history of Roux-en-Y gastrojejunostomy, GERD, gastric ulcers, presents to the hospital with complaints of abdominal pain.  This was occurring intermittently, but since Thanksgiving it has been constant.  He has been taking NSAIDs without relief.  CT of the abdomen pelvis showed findings consistent with gastric bowel obstruction.  He was admitted for GI and general surgery evaluation.   Assessment & Plan:   Principal Problem:   Gastric outflow obstruction Active Problems:   AKI (acute kidney injury) ([place])   Elevated lipase   Tobacco use disorder   Substance abuse ([place])   CAP (community acquired pneumonia)   Gastric outlet obstruction -Appreciate GI and general surgery input -NG tube initially placed for decompression, but removed overnight 11/26.  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  Scope was able to pass through the anastomosis. -Continue PPI twice daily, started on Carafate -He will also need to continue full liquid diet for now which can likely advance to soft foods as an outpatient in the next several days -He was advised that he needs to quit smoking, methamphetamine, cocaine -He will try GI cocktail for symptom management   Community acquired pneumonia -Infiltrate noted on CT scan -On antibiotics with ceftriaxone and azithromycin -Continue current treatments   Polysubstance abuse -Admits to methamphetamine use and heroin -Urine toxicology screen grossly positive for opiates, cocaine, amphetamines, THC   AKI -Creatinine 1.5 on admission -Secondary to dehydration -Improved with IV  fluids, creatinine currently 0.7  Microcytic anemia Iron deficiency anemia, likely related to chronic blood loss from GI tract -Hemoglobin currently 9.6, stable -Suspect further downtrend with IV hydration since he is likely still hemoconcentrated -will give iron infusion  Hypokalemia -Replace   Elevated lipase -No evidence of pancreatitis on CT -May have more chronic pancreatitis -Home meds to include Creon, will resume when able to take p.o.   DVT prophylaxis: heparin injection 5,000 Units Start: 05/02/22 1800 SCDs Start: 04/30/22 0542  Code Status: Full code Family Communication: No family present Disposition Plan: Status is: Inpatient Remains inpatient appropriate because: Awaiting endoscopic evaluation for GOO     Consultants:  GI General surgery  Procedures:    Antimicrobials:  Ceftriaxone 11/26 > Azithromycin 11/26 >   Subjective: Sitting up in bed. Said he had some oxycodone and it hurt his throat on the way down. He said he wants to try a GI cocktail  Objective: Vitals:   05/02/22 1250 05/02/22 1300 05/02/22 1315 05/02/22 1359  BP: (!) 100/53 103/62 110/74 103/64  Pulse: 88 83 92 93  Resp: (!) '22 14 19 18  '$ Temp: 97.8 F (36.6 C)   (!) 97.4 F (36.3 C)  TempSrc:    Oral  SpO2: 95% 96%  99%  Weight:      Height:        Intake/Output Summary (Last 24 hours) at 05/02/2022 1943 Last data filed at 05/02/2022 1519 Gross per 24 hour  Intake 2225.82 ml  Output --  Net 2225.82 ml   Filed Weights   04/30/22 0003 05/02/22 1046  Weight: 79.4 kg 78.5 kg    Examination:  General exam: Appears calm and comfortable  Respiratory system: Clear  to auscultation. Respiratory effort normal. Cardiovascular system: S1 & S2 heard, RRR. No JVD, murmurs, rubs, gallops or clicks. No pedal edema. Gastrointestinal system: Abdomen is nondistended, soft and nontender. No organomegaly or masses felt. Normal bowel sounds heard. Central nervous system: Somnolent, but  will wake up to voice.  No focal neurological deficits. Extremities: Symmetric 5 x 5 power. Skin: No rashes, lesions or ulcers Psychiatry: Somnolent, but will wake up to voice.  Answers yes and no, but does not really engage in conversation    Data Reviewed: I have personally reviewed following labs and imaging studies  CBC: Recent Labs  Lab 04/30/22 0215 05/01/22 0424 05/02/22 0324  WBC 24.5* 13.8* 7.0  NEUTROABS 22.8* 10.7*  --   HGB 12.9* 9.8* 9.6*  HCT 41.8 31.0* 30.4*  MCV 71.1* 70.5* 70.2*  PLT 343 315 001   Basic Metabolic Panel: Recent Labs  Lab 04/30/22 0214 05/01/22 0424 05/02/22 0324  NA 134* 137 136  K 3.7 3.0* 3.5  CL 95* 105 109  CO2 '25 25 23  '$ GLUCOSE 117* 79 179*  BUN 30* 13 10  CREATININE 1.56* 0.82 0.74  CALCIUM 9.2 8.2* 8.4*  MG  --  1.7 2.1  PHOS  --  2.3* 2.9   GFR: Estimated Creatinine Clearance: 141.5 mL/min (by C-G formula based on SCr of 0.74 mg/dL). Liver Function Tests: Recent Labs  Lab 04/30/22 0214 05/01/22 0424 05/02/22 0324  AST 21 19  --   ALT 20 14  --   ALKPHOS 80 60  --   BILITOT 0.3 0.6  --   PROT 7.9 5.9*  --   ALBUMIN 4.4 3.1* 3.0*   Recent Labs  Lab 04/30/22 0214  LIPASE 106*   No results for input(s): "AMMONIA" in the last 168 hours. Coagulation Profile: No results for input(s): "INR", "PROTIME" in the last 168 hours. Cardiac Enzymes: No results for input(s): "CKTOTAL", "CKMB", "CKMBINDEX", "TROPONINI" in the last 168 hours. BNP (last 3 results) No results for input(s): "PROBNP" in the last 8760 hours. HbA1C: No results for input(s): "HGBA1C" in the last 72 hours. CBG: Recent Labs  Lab 05/01/22 1249  GLUCAP 100*   Lipid Profile: No results for input(s): "CHOL", "HDL", "LDLCALC", "TRIG", "CHOLHDL", "LDLDIRECT" in the last 72 hours. Thyroid Function Tests: Recent Labs    05/01/22 0425  TSH 0.583   Anemia Panel: Recent Labs    05/02/22 0324  FERRITIN 14*  TIBC 404  IRON 12*   Sepsis Labs: No  results for input(s): "PROCALCITON", "LATICACIDVEN" in the last 168 hours.  Recent Results (from the past 240 hour(s))  Culture, blood (Routine X 2) w Reflex to ID Panel     Status: None (Preliminary result)   Collection Time: 04/30/22  6:22 AM   Specimen: Right Antecubital; Blood  Result Value Ref Range Status   Specimen Description   Final    RIGHT ANTECUBITAL BOTTLES DRAWN AEROBIC AND ANAEROBIC   Special Requests   Final    Blood Culture results may not be optimal due to an excessive volume of blood received in culture bottles   Culture   Final    NO GROWTH < 24 HOURS Performed at [facility], [address], [place], [place] [address]    Report Status PENDING  Incomplete  Culture, blood (Routine X 2) w Reflex to ID Panel     Status: None (Preliminary result)   Collection Time: 04/30/22  6:36 AM   Specimen: BLOOD RIGHT HAND  Result Value Ref  Range Status   Specimen Description   Final    BLOOD RIGHT HAND BOTTLES DRAWN AEROBIC AND ANAEROBIC   Special Requests   Final    Blood Culture results may not be optimal due to an excessive volume of blood received in culture bottles   Culture   Final    NO GROWTH < 24 HOURS Performed at [facility], [address], [place], [place] [address]    Report Status PENDING  Incomplete         Radiology Studies: No results found.      Scheduled Meds:  heparin injection (subcutaneous)  5,000 Units Subcutaneous Q8H   hydrOXYzine  25 mg Oral BID   pantoprazole  40 mg Intravenous Q12H   sucralfate  1 g Oral TID WC & HS   traZODone  150 mg Oral QHS   Continuous Infusions:  0.9 % NaCl with KCl 40 mEq / L 100 mL/hr at 05/02/22 1519   azithromycin Stopped (05/02/22 0759)   cefTRIAXone (ROCEPHIN)  IV Stopped (05/02/22 0622)   ferric gluconate (FERRLECIT) IVPB       LOS: 2 days    Time spent: 55mns    JKathie Dike MD [facility]   If 7PM-7AM, please contact night-coverage [email or web address]  05/02/2022, 7:43 PM

## 2022-05-02 NOTE — Anesthesia Preprocedure Evaluation (Signed)
Anesthesia Evaluation  Patient identified by MRN, date of birth, ID band Patient awake    Reviewed: Allergy & Precautions, H&P , NPO status , Patient's Chart, lab work & pertinent test results  Airway Mallampati: II  TM Distance: >3 FB Neck ROM: Full    Dental  (+) Dental Advisory Given, Chipped   Pulmonary pneumonia, resolved, Patient abstained from smoking.   Pulmonary exam normal breath sounds clear to auscultation       Cardiovascular hypertension, Normal cardiovascular exam Rhythm:Regular Rate:Tachycardia     Neuro/Psych negative neurological ROS  negative psych ROS   GI/Hepatic ,GERD  Medicated and Poorly Controlled,,(+)     substance abuse  cocaine use, marijuana use and methamphetamine use  Endo/Other  negative endocrine ROS    Renal/GU Renal disease  negative genitourinary   Musculoskeletal negative musculoskeletal ROS (+)  narcotic dependent  Abdominal   Peds negative pediatric ROS (+)  Hematology  (+) Blood dyscrasia, anemia   Anesthesia Other Findings   Reproductive/Obstetrics negative OB ROS                             Anesthesia Physical Anesthesia Plan  ASA: 3 and emergent  Anesthesia Plan: General   Post-op Pain Management: Minimal or no pain anticipated   Induction: Intravenous, Rapid sequence and Cricoid pressure planned  PONV Risk Score and Plan: 3 and Ondansetron and Dexamethasone  Airway Management Planned: Oral ETT  Additional Equipment:   Intra-op Plan:   Post-operative Plan: Extubation in OR and Possible Post-op intubation/ventilation  Informed Consent: I have reviewed the patients History and Physical, chart, labs and discussed the procedure including the risks, benefits and alternatives for the proposed anesthesia with the patient or authorized representative who has indicated his/her understanding and acceptance.     Dental advisory  given  Plan Discussed with: CRNA and Surgeon  Anesthesia Plan Comments:        Anesthesia Quick Evaluation

## 2022-05-02 NOTE — Anesthesia Postprocedure Evaluation (Signed)
Anesthesia Post Note  Patient: Kaion Tisdale  Procedure(s) Performed: ESOPHAGOGASTRODUODENOSCOPY (EGD) WITH PROPOFOL  Patient location during evaluation: PACU Anesthesia Type: General Level of consciousness: awake and alert and oriented Pain management: pain level controlled Vital Signs Assessment: post-procedure vital signs reviewed and stable Respiratory status: spontaneous breathing, nonlabored ventilation and respiratory function stable Cardiovascular status: blood pressure returned to baseline and stable Postop Assessment: no apparent nausea or vomiting Anesthetic complications: no  No notable events documented.   Last Vitals:  Vitals:   05/02/22 1250 05/02/22 1300  BP: (!) 100/53 103/62  Pulse: 88 83  Resp: (!) 22 14  Temp: 36.6 C   SpO2: 95% 96%    Last Pain:  Vitals:   05/02/22 1250  TempSrc:   PainSc: Asleep                 Zandrea Kenealy C Finnlee Guarnieri

## 2022-05-02 NOTE — Op Note (Signed)
[facility] Patient Name: David Miranda Procedure Date: 05/02/2022 11:55 AM MRN: [id number] Date of Birth: 05/19/1986 Attending MD: Maylon Peppers , , [id number] CSN: [id number] Age: 36 Admit Type: Outpatient Procedure:                Upper GI endoscopy Indications:              Suspected stenosis of the stomach, Abnormal CT of                            the GI tract Providers:                Maylon Peppers, Rosina Lowenstein, RN, Aram Candela Referring MD:              Medicines:                General Anesthesia Complications:            No immediate complications. Estimated Blood Loss:     Estimated blood loss: none. Procedure:                Pre-Anesthesia Assessment:                           - Prior to the procedure, a History and Physical                            was performed, and patient medications, allergies                            and sensitivities were reviewed. The patient's                            tolerance of previous anesthesia was reviewed.                           - The risks and benefits of the procedure and the                            sedation options and risks were discussed with the                            patient. All questions were answered and informed                            consent was obtained.                           - ASA Grade Assessment: III - A patient with severe                            systemic disease.                           After obtaining informed consent, the endoscope was                            passed under direct  vision. Throughout the                            procedure, the patient's blood pressure, pulse, and                            oxygen saturations were monitored continuously. The                            GIF-H190 ([id number]) scope was introduced through the                            mouth, and advanced to the jejunum. The upper GI                            endoscopy was accomplished without  difficulty. The                            patient tolerated the procedure well. Scope In: 12:17:12 PM Scope Out: 12:30:03 PM Total Procedure Duration: 0 hours 12 minutes 51 seconds  Findings:      LA Grade D (one or more mucosal breaks involving at least 75% of       esophageal circumference) esophagitis with no bleeding was found 38 to       42 cm from the incisors.      A 2 cm hiatal hernia was present.      A small amount of food (residue) was found in the gastric body.      A 10 mm healed ulcer was found on the greater curvature of the gastric       body. The scar tissue was healthy in appearance.      There was presence of a patent pylorus - this was traversed and I was       able to examine the post surgical ?duodenal stump, which showed diffuse       mild inflammation characterized by congestion and edema. Scope was not       advanced past the stump.      Evidence of a gastrojejunostomy was found in the posterior wall of the       stomach. This was characterized by erythema, inflammation and ulceration       (ulcer measured close to 1 cm, had clean base and was located in the       gastric side). Even though there was associated transient narrowing of       the gastrojejunostomy due to edema, this was easily traversed with the       scope.      There was evidence of a widely patent jejunojejunal anastomosis       consistent with history of RNY anatomy. This was characterized by       healthy appearing mucosa. No abnormalities were found in the rest of the       jejunum. Impression:               - LA Grade D esophagitis with no bleeding.                           - 2 cm hiatal hernia.                           -  A small amount of food (residue) in the stomach.                           - Scar in the gastric body (greater curvature).                           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                           - A gastrojejunostomy was found, characterized  by                            inflammation, erythema and ulceration. No stricture                            was found, scope was easily advanced.                           - Widely patent jejunojejunal anastomosis,                            characterized by healthy appearing mucosa was found                            in the jejunum consistent with RNY anatomy. Jejunum                            was otherwise unremarkable.                           - No specimens collected. Moderate Sedation:      General anesthesia Recommendation:           - Return patient to hospital ward for ongoing care.                           - Full liquid diet.                           - Use Protonix (pantoprazole) 40 mg IV BID, will                            need to discharge on Zegerid twice a day.                           - Use sucralfate suspension 1 gram PO QID for 1                            month.                           - Smoking/methamphetamine/cocaine cessation is                            imperative.                           -  Repeat EGD in 2 months to assess ulcer                            improvement. Procedure Code(s):        --- Professional ---                           [phone], Esophagogastroduodenoscopy, flexible,                            transoral; diagnostic, including collection of                            specimen(s) by brushing or washing, when performed                            (separate procedure) Diagnosis Code(s):        --- Professional ---                           K20.90, Esophagitis, unspecified without bleeding                           K44.9, Diaphragmatic hernia without obstruction or                            gangrene                           K31.89, Other diseases of stomach and duodenum                           K29.80, Duodenitis without bleeding                           Z98.0, Intestinal bypass and anastomosis status                           Z98.890,  Other specified postprocedural states                           R93.3, Abnormal findings on diagnostic imaging of                            other parts of digestive tract CPT copyright 2022 [employer]. All rights reserved. The codes documented in this report are preliminary and upon coder review may  be revised to meet current compliance requirements. Maylon Peppers, MD Maylon Peppers,  05/02/2022 1:05:42 PM This report has been signed electronically. Number of Addenda: 0

## 2022-05-02 NOTE — Progress Notes (Signed)
Per staff, patient had unhooked IV and left room. Sister was in patient room, stated patient had went to car to get clothing.  Security called and escorted patient back to room.  Went to room and explained to patient he was unable to leave the floor, but was welcome to walk floor as needed.  Patient verbalized understanding.  Patient stated, " This was the first time someone told me I could not leave the floor."

## 2022-05-02 NOTE — Progress Notes (Signed)
We will proceed with EGD as scheduled.  I thoroughly discussed with the patient the procedure, including the risks involved. Patient understands what the procedure involves including the benefits and any risks. Patient understands alternatives to the proposed procedure. Risks including (but not limited to) bleeding, tearing of the lining (perforation), rupture of adjacent organs, problems with heart and lung function, infection, and medication reactions. A small percentage of complications may require surgery, hospitalization, repeat endoscopic procedure, and/or transfusion.  Patient understood and agreed.  Tahiry Spicer Castaneda, MD Gastroenterology and Hepatology [place] [facility]  

## 2022-05-02 NOTE — Anesthesia Procedure Notes (Signed)
Procedure Name: Intubation Date/Time: 05/02/2022 12:15 PM  Performed by: Jonna Munro, CRNAPre-anesthesia Checklist: Patient identified, Emergency Drugs available, Suction available, Patient being monitored and Timeout performed Patient Re-evaluated:Patient Re-evaluated prior to induction Oxygen Delivery Method: Circle system utilized Preoxygenation: Pre-oxygenation with 100% oxygen Induction Type: IV induction, Cricoid Pressure applied and Rapid sequence Laryngoscope Size: Mac and 3 Grade View: Grade I Tube type: Oral Tube size: 7.5 mm Number of attempts: 1 Airway Equipment and Method: Stylet Placement Confirmation: ETT inserted through vocal cords under direct vision, positive ETCO2, CO2 detector and breath sounds checked- equal and bilateral Secured at: 23 cm Tube secured with: Tape Dental Injury: Teeth and Oropharynx as per pre-operative assessment

## 2022-05-02 NOTE — Transfer of Care (Signed)
Immediate Anesthesia Transfer of Care Note  Patient: David Miranda  Procedure(s) Performed: ESOPHAGOGASTRODUODENOSCOPY (EGD) WITH PROPOFOL  Patient Location: PACU  Anesthesia Type:General  Level of Consciousness: awake, alert , oriented, and patient cooperative  Airway & Oxygen Therapy: Patient Spontanous Breathing  Post-op Assessment: Report given to RN, Post -op Vital signs reviewed and stable, and Patient moving all extremities X 4  Post vital signs: Reviewed and stable  Last Vitals:  Vitals Value Taken Time  BP 100/53 05/02/22 1250  Temp    Pulse 83 05/02/22 1251  Resp 20 05/02/22 1251  SpO2 94 % 05/02/22 1251  Vitals shown include unvalidated device data.  Last Pain:  Vitals:   05/02/22 1210  TempSrc:   PainSc: 0-No pain      Patients Stated Pain Goal: 5 (19/47/12 5271)  Complications: No notable events documented.

## 2022-05-02 NOTE — Progress Notes (Signed)
Patient calling out frequently asking for "anything I can have". Patient had several cups and candy at bedside but It unclear if he ate or drank. The necessity of being npo was explained to the patient.

## 2022-05-02 NOTE — Progress Notes (Signed)
[facility] Progress Note  Day of Surgery  Subjective: Complaining of dry mouth. Somewhat distended.   Objective: Vital signs in last 24 hours: Temp:  [97.8 F (36.6 C)-98.3 F (36.8 C)] 97.9 F (36.6 C) (11/28 0503) Pulse Rate:  [74-104] 89 (11/28 0503) Resp:  [12-16] 16 (11/28 0503) BP: (106-113)/(61-90) 113/90 (11/28 0503) SpO2:  [77 %-100 %] 100 % (11/28 0503) Last BM Date : 04/30/22  Intake/Output from previous day: 11/27 0701 - 11/28 0700 In: 1124.1 [I.V.:493.7; IV Piggyback:630.4] Out: -  Intake/Output this shift: No intake/output data recorded.  General appearance: alert and no distress GI: mildly distended, minimally tender epigastric   Lab Results:  Recent Labs    05/01/22 0424 05/02/22 0324  WBC 13.8* 7.0  HGB 9.8* 9.6*  HCT 31.0* 30.4*  PLT 315 353   BMET Recent Labs    05/01/22 0424 05/02/22 0324  NA 137 136  K 3.0* 3.5  CL 105 109  CO2 25 23  GLUCOSE 79 179*  BUN 13 10  CREATININE 0.82 0.74  CALCIUM 8.2* 8.4*   PT/INR No results for input(s): "LABPROT", "INR" in the last 72 hours.  Studies/Results: DG Abd 1 View  Result Date: 04/30/2022 CLINICAL DATA:  NG tube placement. EXAM: ABDOMEN - 1 VIEW COMPARISON:  04/14/2019 FINDINGS: Enteric tube noted with tip overlying the distal stomach. No dilated bowel loops are present. IMPRESSION: Enteric tube with tip overlying the distal stomach. Electronically Signed   By: Margarette Canada M.D.   On: 04/30/2022 12:26    Anti-infectives: Anti-infectives (From admission, onward)    Start     Dose/Rate Route Frequency Ordered Stop   04/30/22 0645  cefTRIAXone (ROCEPHIN) 1 g in sodium chloride 0.9 % 100 mL IVPB        1 g 200 mL/hr over 30 Minutes Intravenous Daily 04/30/22 0615     04/30/22 0645  azithromycin (ZITHROMAX) 500 mg in sodium chloride 0.9 % 250 mL IVPB        500 mg 250 mL/hr over 60 Minutes Intravenous Daily 04/30/22 0615 05/05/22 0559       Assessment/Plan: Patient  with GOO at [place] anastomosis. EGD today.  Further plans after EGD   LOS: 2 days    Virl Cagey 05/02/2022

## 2022-05-03 DIAGNOSIS — K269 Duodenal ulcer, unspecified as acute or chronic, without hemorrhage or perforation: Secondary | ICD-10-CM

## 2022-05-03 DIAGNOSIS — N179 Acute kidney failure, unspecified: Secondary | ICD-10-CM | POA: Diagnosis not present

## 2022-05-03 DIAGNOSIS — K2980 Duodenitis without bleeding: Secondary | ICD-10-CM | POA: Diagnosis not present

## 2022-05-03 DIAGNOSIS — R69 Illness, unspecified: Secondary | ICD-10-CM | POA: Diagnosis not present

## 2022-05-03 DIAGNOSIS — K209 Esophagitis, unspecified without bleeding: Secondary | ICD-10-CM | POA: Diagnosis not present

## 2022-05-03 DIAGNOSIS — F1910 Other psychoactive substance abuse, uncomplicated: Secondary | ICD-10-CM | POA: Diagnosis not present

## 2022-05-03 DIAGNOSIS — K311 Adult hypertrophic pyloric stenosis: Secondary | ICD-10-CM | POA: Diagnosis not present

## 2022-05-03 DIAGNOSIS — J189 Pneumonia, unspecified organism: Secondary | ICD-10-CM | POA: Diagnosis not present

## 2022-05-03 LAB — BASIC METABOLIC PANEL
Anion gap: 8 (ref 5–15)
BUN: 8 mg/dL (ref 6–20)
CO2: 21 mmol/L — ABNORMAL LOW (ref 22–32)
Calcium: 8.6 mg/dL — ABNORMAL LOW (ref 8.9–10.3)
Chloride: 108 mmol/L (ref 98–111)
Creatinine, Ser: 0.84 mg/dL (ref 0.61–1.24)
GFR, Estimated: >60 mL/min (ref >60)
Glucose, Bld: 212 mg/dL — ABNORMAL HIGH (ref 70–99)
Potassium: 4.3 mmol/L (ref 3.5–5.1)
Sodium: 137 mmol/L (ref 135–145)

## 2022-05-03 LAB — CBC
HCT: 26 % — ABNORMAL LOW (ref 39.0–52.0)
Hemoglobin: 8.2 g/dL — ABNORMAL LOW (ref 13.0–17.0)
MCH: 21.8 pg — ABNORMAL LOW (ref 26.0–34.0)
MCHC: 31.5 g/dL (ref 30.0–36.0)
MCV: 69.1 fL — ABNORMAL LOW (ref 80.0–100.0)
Platelets: 351 10*3/uL (ref 150–400)
RBC: 3.76 MIL/uL — ABNORMAL LOW (ref 4.22–5.81)
RDW: 17.9 % — ABNORMAL HIGH (ref 11.5–15.5)
WBC: 9.8 10*3/uL (ref 4.0–10.5)
nRBC: 0 % (ref 0.0–0.2)

## 2022-05-03 NOTE — Progress Notes (Signed)
Pt signed himself out AMA, Stated he was going to be discharged in the morning anyway. Also states he has to be in court in the morning and his ride is in area so he is going to take his ride tonight. Pt removed his own IV before nurse returned to the room. Pt advised on risks of leaving AMA, states "that's fine, I don't care"

## 2022-05-03 NOTE — Progress Notes (Addendum)
[facility] Progress Note  1 Day Post-Op  Subjective: Egd with patent GJ and old ulcer. Doing well and on diet.   Objective: Vital signs in last 24 hours: Temp:  [97.4 F (36.3 C)-99.2 F (37.3 C)] 98.2 F (36.8 C) (11/29 0442) Pulse Rate:  [83-102] 86 (11/29 0442) Resp:  [14-25] 20 (11/29 0442) BP: (100-127)/(53-83) 116/73 (11/29 0442) SpO2:  [95 %-100 %] 100 % (11/29 0442) Weight:  [78.5 kg] 78.5 kg (11/28 1046) Last BM Date : 04/30/22  Intake/Output from previous day: 11/28 0701 - 11/29 0700 In: 1732.2 [I.V.:1382.2; IV Piggyback:350] Out: -  Intake/Output this shift: No intake/output data recorded.  General appearance: alert and no distress GI: soft, minimally distended, nontender   Lab Results:  Recent Labs    05/02/22 0324 05/03/22 0413  WBC 7.0 9.8  HGB 9.6* 8.2*  HCT 30.4* 26.0*  PLT 353 351   BMET Recent Labs    05/02/22 0324 05/03/22 0413  NA 136 137  K 3.5 4.3  CL 109 108  CO2 23 21*  GLUCOSE 179* 212*  BUN 10 8  CREATININE 0.74 0.84  CALCIUM 8.4* 8.6*   PT/INR No results for input(s): "LABPROT", "INR" in the last 72 hours.  Studies/Results: No results found.  Anti-infectives: Anti-infectives (From admission, onward)    Start     Dose/Rate Route Frequency Ordered Stop   04/30/22 0645  cefTRIAXone (ROCEPHIN) 1 g in sodium chloride 0.9 % 100 mL IVPB        1 g 200 mL/hr over 30 Minutes Intravenous Daily 04/30/22 0615     04/30/22 0645  azithromycin (ZITHROMAX) 500 mg in sodium chloride 0.9 % 250 mL IVPB        500 mg 250 mL/hr over 60 Minutes Intravenous Daily 04/30/22 0615 05/05/22 0559       Assessment/Plan: Patient with open GJ. EGD repeat in a few months. Diet as tolerated. No acute surgical intervention needed. Agree with GI's plan.  Can follow up with GI for EGD. No need to follow up with surgery.    LOS: 3 days    Virl Cagey 05/03/2022

## 2022-05-03 NOTE — Progress Notes (Signed)
Subjective: Patient endorses lower abdominal pain that is sometimes on the left and sometimes on the right, though improved from initially, no nausea or vomiting. No BMs today, last was yesterday morning. He tolerated liquid diet. Report he had grits this morning and did fine with these. He is interested in trying to eat some solid food today.   Objective: Vital signs in last 24 hours: Temp:  [97.4 F (36.3 C)-99.2 F (37.3 C)] 98.2 F (36.8 C) (11/29 0442) Pulse Rate:  [83-102] 86 (11/29 0442) Resp:  [14-25] 20 (11/29 0442) BP: (100-127)/(53-83) 116/73 (11/29 0442) SpO2:  [95 %-100 %] 100 % (11/29 0442) Weight:  [78.5 kg] 78.5 kg (11/28 1046) Last BM Date : 04/30/22 General:   Alert and oriented, pleasant Head:  Normocephalic and atraumatic. Eyes:  No icterus, sclera clear. Conjuctiva pink.  Mouth:  Without lesions, mucosa pink and moist.   Heart:  S1, S2 present, no murmurs noted.  Lungs: Clear to auscultation bilaterally, without wheezing, rales, or rhonchi.  Abdomen:  Bowel sounds present, soft, non-distended. Mild TTP of diffuse mid to lower abdomen. No HSM or hernias noted. No rebound or guarding. No masses appreciated  Msk:  Symmetrical without gross deformities. Normal posture. Pulses:  Normal pulses noted. Extremities:  Without clubbing or edema. Neurologic:  Alert and  oriented x4;  grossly normal neurologically. Skin:  Warm and dry, intact without significant lesions.  Psych:  Alert and cooperative. Normal mood and affect.  Intake/Output from previous day: 11/28 0701 - 11/29 0700 In: 1732.2 [I.V.:1382.2; IV Piggyback:350] Out: -  Intake/Output this shift: No intake/output data recorded.  Lab Results: Recent Labs    05/01/22 0424 05/02/22 0324 05/03/22 0413  WBC 13.8* 7.0 9.8  HGB 9.8* 9.6* 8.2*  HCT 31.0* 30.4* 26.0*  PLT 315 353 351   BMET Recent Labs    05/01/22 0424 05/02/22 0324 05/03/22 0413  NA 137 136 137  K 3.0* 3.5 4.3  CL 105 109 108  CO2  25 23 21*  GLUCOSE 79 179* 212*  BUN '13 10 8  '$ CREATININE 0.82 0.74 0.84  CALCIUM 8.2* 8.4* 8.6*   LFT Recent Labs    05/01/22 0424 05/02/22 0324  PROT 5.9*  --   ALBUMIN 3.1* 3.0*  AST 19  --   ALT 14  --   ALKPHOS 60  --   BILITOT 0.6  --    Assessment: David Miranda is a 36 y.o. 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 GI consulted for gastric outlet obstruction concern.    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 No stricture. Widely patent jejunojejunal anastomosis, characterized by healthy appearing mucosa was found in the jejunum consistent with RNY anatomy. Recommend continued PPI IV BID, carafate 1g QID. Will need to be d/c on zegerid BID(open capsule) and repeat EGD in 2 months to assess healing of ulcer. Abdominal pain is improving. He has no nausea or vomiting. Tolerating liquid diet and reports grits this morning were well tolerated. Will attempt to advance to soft diet  today.    Plan: Continue PPI BID Continue carafate 1g QID Illicit drug use cessation** Repeat EGD in 2 months  Advance to soft diet    LOS: 3 days    05/03/2022, 8:58 AM   Ted Goodner L. Alver Sorrow, MSN, APRN, AGNP-C Adult-Gerontology Nurse Practitioner [facility] G[facility]enterology at [facility]

## 2022-05-03 NOTE — Progress Notes (Signed)
PROGRESS NOTE  David Miranda LNL:[id number] DOB: 1986/04/11 DOA: 04/29/2022 PCP: Pcp, No  Brief History:  36 year old male with a history of Roux-en-Y gastrojejunostomy, GERD, gastric ulcers, presents to the hospital with complaints of abdominal pain. This was occurring intermittently, but since Thanksgiving it has been constant. He has been taking NSAIDs without relief. CT of the abdomen pelvis showed findings consistent with gastric bowel obstruction. He was admitted for GI and general surgery evaluation.   Assessment/Plan: Gastric outlet obstruction -Appreciate GI and general surgery input -NG tube initially placed for decompression, but removed overnight 11/26.  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 No stricture. Widely patent jejunojejunal anastomosis, Scope was able to pass through the anastomosis. -Continue PPI twice daily, started on Carafate -He will also need to continue full liquid diet for now which can likely advance to soft foods as an outpatient in the next several days -He was advised that he needs to quit smoking, methamphetamine, cocaine -11/29--advance to soft diet   Community acquired pneumonia/Lobar Pneumonia -LLL Infiltrate noted on CT scan 04/30/22 -Contnueceftriaxone and azithromycin -Continue current treatments   Polysubstance abuse -Admits to methamphetamine use and heroin -Urine toxicology screen grossly positive for opiates, cocaine, amphetamines, THC -cessation discussed   AKI -Creatinine 1.5 on admission -Secondary to dehydration -Improved with IV fluids, -baseline creatinine 0.7-0.9   Microcytic anemia/Fe deficiency anemia Iron deficiency anemia, likely related to  chronic blood loss from GI tract -Hemoglobin currently 9.6, stable -Suspect further downtrend with IV hydration since he is likely still hemoconcentrated -iron saturation 3, ferritin 14 -Ferrelicit x 2 given -start ferrous sulfate   Hypokalemia -Replace -mag 2.1   Elevated lipase -No evidence of pancreatitis on CT -lipase 106 -May have more chronic pancreatitis -Home meds to include Creon, will resume when able to take p.o.      Family Communication:   no Family at bedside  Consultants:  GI, general surgery  Code Status:  FULL   DVT Prophylaxis:  Alexander Heparin    Procedures: As Listed in Progress Note Above  Antibiotics: Ceftriaxone 11/26 > Azithromycin 11/26 >       Subjective: Patient states abd pain is improving.  He had a BM today.  Denies f/c, cp, sob, n/v/d.  No hematochezia or melena or dysuria.  Objective: Vitals:   05/02/22 1359 05/02/22 2111 05/03/22 0442 05/03/22 1439  BP: 103/64 (!) 108/55 116/73 119/74  Pulse: 93 (!) 101 86 87  Resp: '18 19 20 20  '$ Temp: (!) 97.4 F (36.3 C) 98.8 F (37.1 C) 98.2 F (36.8 C) 97.8 F (36.6 C)  TempSrc: Oral Oral Oral Oral  SpO2: 99% 97% 100% 99%  Weight:      Height:        Intake/Output Summary (Last 24 hours) at 05/03/2022 1654 Last data filed at 05/03/2022 1524 Gross per 24 hour  Intake 1730.72 ml  Output --  Net 1730.72 ml   Weight change:  Exam:  General:  Pt is alert, follows commands appropriately, not in acute distress HEENT: No icterus, No thrush, No neck mass, [place]/AT Cardiovascular: RRR, S1/S2, no rubs, no gallops Respiratory: CTA bilaterally, no wheezing, no crackles, no rhonchi Abdomen: Soft/+BS, non tender, non distended, no guarding Extremities: No edema, No lymphangitis,  No petechiae, No rashes, no synovitis   Data Reviewed: I have personally reviewed following labs and imaging studies Basic Metabolic Panel: Recent Labs  Lab 04/30/22 0214 05/01/22 0424 05/02/22 0324  05/03/22 0413  NA 134* 137 136 137  K 3.7 3.0* 3.5 4.3  CL 95* 105 109 108  CO2 '25 25 23 '$ 21*  GLUCOSE 117* 79 179* 212*  BUN 30* '13 10 8  '$ CREATININE 1.56* 0.82 0.74 0.84  CALCIUM 9.2 8.2* 8.4* 8.6*  MG  --  1.7 2.1  --   PHOS  --  2.3* 2.9  --    Liver Function Tests: Recent Labs  Lab 04/30/22 0214 05/01/22 0424 05/02/22 0324  AST 21 19  --   ALT 20 14  --   ALKPHOS 80 60  --   BILITOT 0.3 0.6  --   PROT 7.9 5.9*  --   ALBUMIN 4.4 3.1* 3.0*   Recent Labs  Lab 04/30/22 0214  LIPASE 106*   No results for input(s): "AMMONIA" in the last 168 hours. Coagulation Profile: No results for input(s): "INR", "PROTIME" in the last 168 hours. CBC: Recent Labs  Lab 04/30/22 0215 05/01/22 0424 05/02/22 0324 05/03/22 0413  WBC 24.5* 13.8* 7.0 9.8  NEUTROABS 22.8* 10.7*  --   --   HGB 12.9* 9.8* 9.6* 8.2*  HCT 41.8 31.0* 30.4* 26.0*  MCV 71.1* 70.5* 70.2* 69.1*  PLT 343 315 353 351   Cardiac Enzymes: No results for input(s): "CKTOTAL", "CKMB", "CKMBINDEX", "TROPONINI" in the last 168 hours. BNP: Invalid input(s): "POCBNP" CBG: Recent Labs  Lab 05/01/22 1249  GLUCAP 100*   HbA1C: No results for input(s): "HGBA1C" in the last 72 hours. Urine analysis:    Component Value Date/Time   COLORURINE YELLOW 10/16/2020 0806   APPEARANCEUR CLEAR 10/16/2020 0806   LABSPEC >1.046 (H) 10/16/2020 0806   PHURINE 7.0 10/16/2020 0806   GLUCOSEU NEGATIVE 10/16/2020 0806   HGBUR NEGATIVE 10/16/2020 0806   BILIRUBINUR NEGATIVE 10/16/2020 0806   KETONESUR NEGATIVE 10/16/2020 0806   PROTEINUR NEGATIVE 10/16/2020 0806   NITRITE NEGATIVE 10/16/2020 0806   LEUKOCYTESUR NEGATIVE 10/16/2020 0806   Sepsis Labs: '@LABRCNTIP'$ (procalcitonin:4,lacticidven:4) ) Recent Results (from the past 240 hour(s))  Culture, blood (Routine X 2) w Reflex to ID Panel     Status: None (Preliminary result)   Collection Time: 04/30/22  6:22 AM   Specimen: Right Antecubital; Blood  Result Value Ref Range  Status   Specimen Description   Final    RIGHT ANTECUBITAL BOTTLES DRAWN AEROBIC AND ANAEROBIC   Special Requests   Final    Blood Culture results may not be optimal due to an excessive volume of blood received in culture bottles   Culture   Final    NO GROWTH < 24 HOURS Performed at [facility], [address], [place], [place] [address]    Report Status PENDING  Incomplete  Culture, blood (Routine X 2) w Reflex to ID Panel     Status: None (Preliminary result)   Collection Time: 04/30/22  6:36 AM   Specimen: BLOOD RIGHT HAND  Result Value Ref Range Status   Specimen Description   Final    BLOOD RIGHT HAND BOTTLES DRAWN AEROBIC AND ANAEROBIC   Special Requests   Final    Blood Culture results may not be optimal due to an excessive volume of blood received in culture bottles   Culture   Final    NO GROWTH < 24 HOURS Performed at [facility]  [facility], [address], [place], [place] [address]    Report Status PENDING  Incomplete     Scheduled Meds:  heparin injection (subcutaneous)  5,000 Units Subcutaneous Q8H   hydrOXYzine  25 mg Oral BID   pantoprazole  40 mg Intravenous Q12H   sucralfate  1 g Oral TID WC & HS   traZODone  150 mg Oral QHS   Continuous Infusions:  0.9 % NaCl with KCl 40 mEq / L 100 mL/hr at 05/03/22 1524   azithromycin Stopped (05/03/22 0650)   cefTRIAXone (ROCEPHIN)  IV Stopped (05/03/22 0554)   ferric gluconate (FERRLECIT) IVPB Stopped (05/03/22 1244)    Procedures/Studies: DG Abd 1 View  Result Date: 04/30/2022 CLINICAL DATA:  NG tube placement. EXAM: ABDOMEN - 1 VIEW COMPARISON:  04/14/2019 FINDINGS: Enteric tube noted with tip overlying the distal stomach. No dilated bowel loops are present. IMPRESSION: Enteric tube with tip overlying the distal stomach. Electronically Signed   By: Margarette Canada M.D.   On: 04/30/2022 12:26   CT ABDOMEN PELVIS W CONTRAST  Result Date: 04/30/2022 CLINICAL DATA:  Epigastric pain. EXAM: CT ABDOMEN AND PELVIS WITH CONTRAST  TECHNIQUE: Multidetector CT imaging of the abdomen and pelvis was performed using the standard protocol following bolus administration of intravenous contrast. RADIATION DOSE REDUCTION: This exam was performed according to the departmental dose-optimization program which includes automated exposure control, adjustment of the mA and/or kV according to patient size and/or use of iterative reconstruction technique. CONTRAST:  88m OMNIPAQUE IOHEXOL 300 MG/ML  SOLN COMPARISON:  Oct 16, 2020 FINDINGS: Lower chest: Moderate severity left lower lobe infiltrate is seen. Mild right basilar atelectasis and/or infiltrate is also noted. Hepatobiliary: No focal liver abnormality is seen. No gallstones, gallbladder wall thickening, or biliary dilatation. Pancreas: Unremarkable. No pancreatic ductal dilatation or surrounding inflammatory changes. Spleen: Normal in size without focal abnormality. Adrenals/Urinary Tract: Adrenal glands are unremarkable. Kidneys are normal, without renal calculi, focal lesion, or hydronephrosis. Bladder is unremarkable. Stomach/Bowel: The visualized portion of the distal esophagus is dilated and fluid-filled (measures approximately 3.0 cm x 2.5 cm). Marked severity gastric distension is seen. Surgical sutures are seen within the gastric region, with surgically anastomosed bowel is also seen within the anterior aspect of the lower abdomen. Appendix appears normal. No evidence of bowel wall thickening, distention, or inflammatory changes. Vascular/Lymphatic: Aortic atherosclerosis. No enlarged abdominal or pelvic lymph nodes. Reproductive: Prostate is unremarkable. Other: No abdominal wall hernia or abnormality. No abdominopelvic ascites. Musculoskeletal: No acute or significant osseous findings. IMPRESSION: 1. Moderate severity left lower lobe infiltrate with mild right basilar atelectasis and/or infiltrate. 2. Findings consistent with gastric outlet obstruction, at the site of surgical anastomosis of  the patient's prior gastrojejunostomy. 3. Postoperative changes within the gastric region and anterior aspect of the lower abdomen. 4. Aortic atherosclerosis. Aortic Atherosclerosis (ICD10-I70.0). Electronically Signed   By: TVirgina NorfolkM.D.   On: 04/30/2022 03:35    DOrson Eva DO  [facility]  If 7PM-7AM, please contact night-coverage [email or web address] Password [email or web address] 05/03/2022, 4:54 PM   LOS: 3 days

## 2022-05-04 ENCOUNTER — Telehealth (INDEPENDENT_AMBULATORY_CARE_PROVIDER_SITE_OTHER): Payer: Self-pay | Admitting: Gastroenterology

## 2022-05-04 NOTE — Discharge Summary (Signed)
Physician Discharge Summary   Patient: David Miranda MRN: [id number] DOB: [birth year]  Admit date:     04/29/2022  Discharge date: 05/03/2022  Discharge Physician: Onalee Hua [place]   PCP: Pcp, No   LEFT AGAINST MEDICAL ADVICE    Hospital Course: 36 year old male with a history of Roux-en-Y gastrojejunostomy, GERD, gastric ulcers, presents to the hospital with complaints of abdominal pain. This was occurring intermittently, but since Thanksgiving it has been constant. He has been taking NSAIDs without relief. CT of the abdomen pelvis showed findings consistent with gastric bowel obstruction. He was admitted for GI and general surgery evaluation.  The patient underwent EGD.  The anastomotic site was characterized by inflammation with an ulceration.  However the gastrojejunal anastomosis was widely patent.  The patient was continued on PPI twice daily and Carafate.  He was started on a full liquid diet.  His diet was gradually advanced.  However, on the evening of 05/03/2022, the patient decided to leave the hospital AGAINST MEDICAL ADVICE.  Patient was advised on the risk of leaving AGAINST MEDICAL ADVICE which she understood.  He had capacity to make decisions.  Assessment and Plan: Gastric outlet obstruction -Appreciate GI and general surgery input -NG tube initially placed for decompression, but removed overnight 11/26.  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 No stricture. Widely patent jejunojejunal anastomosis, Scope was able to pass through the anastomosis. -Continue PPI twice daily, started on Carafate -He will also need to continue full liquid diet for now which can likely advance to soft foods as an outpatient in the next  several days -He was advised that he needs to quit smoking, methamphetamine, cocaine -11/29--advance to soft diet   Community acquired pneumonia/Lobar Pneumonia -LLL Infiltrate noted on CT scan 04/30/22 -Contnueceftriaxone and azithromycin -Continue current treatments   Polysubstance abuse -Admits to methamphetamine use and heroin -Urine toxicology screen grossly positive for opiates, cocaine, amphetamines, THC -cessation discussed   AKI -Creatinine 1.5 on admission -Secondary to dehydration -Improved with IV fluids, -baseline creatinine 0.7-0.9   Microcytic anemia/Fe deficiency anemia Iron deficiency anemia, likely related to chronic blood loss from GI tract -Hemoglobin currently 9.6, stable -Suspect further downtrend with IV hydration since he is likely still hemoconcentrated -iron saturation 3, ferritin 14 -Ferrelicit x 2 given -start ferrous sulfate   Hypokalemia -Replace -mag 2.1   Elevated lipase -No evidence of pancreatitis on CT -lipase 106 -May have more chronic pancreatitis -Home meds to include Creon, will resume when able to take p.o.           Consultants: GI, general surgery Procedures performed: EGD  Disposition: AGAINST MEDICAL ADVICE  DISCHARGE MEDICATION: Allergies as of 05/03/2022   No Known Allergies      Medication List     ASK your doctor about these medications    acetaminophen 325 MG tablet Commonly known as: TYLENOL Take 2 tablets (650 mg total) by mouth every 6 (six) hours as needed for mild pain (or Fever >/= 101).   calcium carbonate 500 MG chewable tablet Commonly known as: TUMS - dosed in mg elemental calcium Chew 2 tablets (400 mg of elemental calcium total) by mouth daily as needed for indigestion or heartburn.   ferrous sulfate 325 (65 FE) MG EC tablet Take 1 tablet (325 mg total) by mouth  2 (two) times daily with a meal.   hydrOXYzine 25 MG tablet Commonly known as: ATARAX Take 1 tablet (25 mg total) by mouth 2  (two) times daily.   linaclotide 145 MCG Caps capsule Commonly known as: LINZESS Take 1 capsule (145 mcg total) by mouth daily before breakfast.   lipase/protease/amylase 16010 UNITS Cpep capsule Commonly known as: CREON Take 2 capsules (72,000 Units total) by mouth 3 (three) times daily with meals.   multivitamin with minerals Tabs tablet Take 1 tablet by mouth daily.   nicotine 14 mg/24hr patch Commonly known as: NICODERM CQ - dosed in mg/24 hours Place 1 patch (14 mg total) onto the skin daily.   omeprazole 40 MG capsule Commonly known as: PRILOSEC Take 1 capsule (40 mg total) by mouth 2 (two) times daily.   oxyCODONE-acetaminophen 5-325 MG tablet Commonly known as: Percocet Take 1 tablet by mouth every 6 (six) hours as needed.   pantoprazole 40 MG tablet Commonly known as: PROTONIX Take 1 tablet (40 mg total) by mouth 2 (two) times daily.   polyethylene glycol 17 g packet Commonly known as: MIRALAX / GLYCOLAX Take 17 g by mouth daily.   sucralfate 1 GM/10ML suspension Commonly known as: Carafate Take 10 mLs (1 g total) by mouth 4 (four) times daily -  with meals and at bedtime.   traZODone 150 MG tablet Commonly known as: DESYREL Take 1 tablet (150 mg total) by mouth at bedtime.        Discharge Exam: Filed Weights   04/30/22 0003 05/02/22 1046  Weight: 79.4 kg 78.5 kg   HEENT:  [place]/AT, No thrush, no icterus CV:  RRR, no rub, no S3, no S4 Lung:  CTA, no wheeze, no rhonchi Abd:  soft/+BS, NT Ext:  No edema, no lymphangitis, no synovitis, no rash   Condition at discharge: AGAINST MEDICAL ADVICE  The results of significant diagnostics from this hospitalization (including imaging, microbiology, ancillary and laboratory) are listed below for reference.   Imaging Studies: DG Abd 1 View  Result Date: 04/30/2022 CLINICAL DATA:  NG tube placement. EXAM: ABDOMEN - 1 VIEW COMPARISON:  04/14/2019 FINDINGS: Enteric tube noted with tip overlying the distal  stomach. No dilated bowel loops are present. IMPRESSION: Enteric tube with tip overlying the distal stomach. Electronically Signed   By: Harmon Pier M.D.   On: 04/30/2022 12:26   CT ABDOMEN PELVIS W CONTRAST  Result Date: 04/30/2022 CLINICAL DATA:  Epigastric pain. EXAM: CT ABDOMEN AND PELVIS WITH CONTRAST TECHNIQUE: Multidetector CT imaging of the abdomen and pelvis was performed using the standard protocol following bolus administration of intravenous contrast. RADIATION DOSE REDUCTION: This exam was performed according to the departmental dose-optimization program which includes automated exposure control, adjustment of the mA and/or kV according to patient size and/or use of iterative reconstruction technique. CONTRAST:  80mL OMNIPAQUE IOHEXOL 300 MG/ML  SOLN COMPARISON:  Oct 16, 2020 FINDINGS: Lower chest: Moderate severity left lower lobe infiltrate is seen. Mild right basilar atelectasis and/or infiltrate is also noted. Hepatobiliary: No focal liver abnormality is seen. No gallstones, gallbladder wall thickening, or biliary dilatation. Pancreas: Unremarkable. No pancreatic ductal dilatation or surrounding inflammatory changes. Spleen: Normal in size without focal abnormality. Adrenals/Urinary Tract: Adrenal glands are unremarkable. Kidneys are normal, without renal calculi, focal lesion, or hydronephrosis. Bladder is unremarkable. Stomach/Bowel: The visualized portion of the distal esophagus is dilated and fluid-filled (measures approximately 3.0 cm x 2.5 cm). Marked severity gastric distension is seen. Surgical sutures are seen within the gastric  region, with surgically anastomosed bowel is also seen within the anterior aspect of the lower abdomen. Appendix appears normal. No evidence of bowel wall thickening, distention, or inflammatory changes. Vascular/Lymphatic: Aortic atherosclerosis. No enlarged abdominal or pelvic lymph nodes. Reproductive: Prostate is unremarkable. Other: No abdominal wall  hernia or abnormality. No abdominopelvic ascites. Musculoskeletal: No acute or significant osseous findings. IMPRESSION: 1. Moderate severity left lower lobe infiltrate with mild right basilar atelectasis and/or infiltrate. 2. Findings consistent with gastric outlet obstruction, at the site of surgical anastomosis of the patient's prior gastrojejunostomy. 3. Postoperative changes within the gastric region and anterior aspect of the lower abdomen. 4. Aortic atherosclerosis. Aortic Atherosclerosis (ICD10-I70.0). Electronically Signed   By: Aram Candela M.D.   On: 04/30/2022 03:35    Microbiology: Results for orders placed or performed during the hospital encounter of 04/29/22  Culture, blood (Routine X 2) w Reflex to ID Panel     Status: None (Preliminary result)   Collection Time: 04/30/22  6:22 AM   Specimen: Right Antecubital; Blood  Result Value Ref Range Status   Specimen Description   Final    RIGHT ANTECUBITAL BOTTLES DRAWN AEROBIC AND ANAEROBIC   Special Requests   Final    Blood Culture results may not be optimal due to an excessive volume of blood received in culture bottles   Culture   Final    NO GROWTH 4 DAYS Performed at [facility], [address], [place], [place] [address]    Report Status PENDING  Incomplete  Culture, blood (Routine X 2) w Reflex to ID Panel     Status: None (Preliminary result)   Collection Time: 04/30/22  6:36 AM   Specimen: BLOOD RIGHT HAND  Result Value Ref Range Status   Specimen Description   Final    BLOOD RIGHT HAND BOTTLES DRAWN AEROBIC AND ANAEROBIC   Special Requests   Final    Blood Culture results may not be optimal due to an excessive volume of blood received in culture bottles   Culture   Final    NO GROWTH 4 DAYS Performed at [facility], [address], [place], [place] [address]    Report Status PENDING  Incomplete    Labs: CBC: Recent Labs  Lab 04/30/22 0215 05/01/22 0424 05/02/22 0324 05/03/22 0413  WBC 24.5* 13.8* 7.0  9.8  NEUTROABS 22.8* 10.7*  --   --   HGB 12.9* 9.8* 9.6* 8.2*  HCT 41.8 31.0* 30.4* 26.0*  MCV 71.1* 70.5* 70.2* 69.1*  PLT 343 315 353 351   Basic Metabolic Panel: Recent Labs  Lab 04/30/22 0214 05/01/22 0424 05/02/22 0324 05/03/22 0413  NA 134* 137 136 137  K 3.7 3.0* 3.5 4.3  CL 95* 105 109 108  CO2 25 25 23  21*  GLUCOSE 117* 79 179* 212*  BUN 30* 13 10 8   CREATININE 1.56* 0.82 0.74 0.84  CALCIUM 9.2 8.2* 8.4* 8.6*  MG  --  1.7 2.1  --   PHOS  --  2.3* 2.9  --    Liver Function Tests: Recent Labs  Lab 04/30/22 0214 05/01/22 0424 05/02/22 0324  AST 21 19  --   ALT 20 14  --   ALKPHOS 80 60  --   BILITOT 0.3 0.6  --   PROT 7.9 5.9*  --   ALBUMIN 4.4 3.1* 3.0*   CBG: Recent Labs  Lab 05/01/22 1249  GLUCAP 100*    Discharge time spent: less than 30 minutes.  Signed: Onalee Hua  Tali Cleaves, MD [facility] 05/04/2022

## 2022-05-04 NOTE — Telephone Encounter (Signed)
Request for hospital follow up made.

## 2022-05-05 ENCOUNTER — Encounter (HOSPITAL_COMMUNITY): Payer: Self-pay | Admitting: Gastroenterology

## 2022-05-05 LAB — CULTURE, BLOOD (ROUTINE X 2)
Culture: NO GROWTH
Culture: NO GROWTH

## 2022-05-25 ENCOUNTER — Encounter (INDEPENDENT_AMBULATORY_CARE_PROVIDER_SITE_OTHER): Payer: Self-pay | Admitting: Gastroenterology

## 2022-05-25 ENCOUNTER — Ambulatory Visit (INDEPENDENT_AMBULATORY_CARE_PROVIDER_SITE_OTHER): Payer: 59 | Admitting: Gastroenterology

## 2022-08-06 ENCOUNTER — Emergency Department (HOSPITAL_COMMUNITY): Payer: 59

## 2022-08-06 ENCOUNTER — Observation Stay (HOSPITAL_COMMUNITY)
Admission: EM | Admit: 2022-08-06 | Discharge: 2022-08-06 | Discharge status: Against Medical Advice | Payer: 59 | Attending: Family Medicine | Admitting: Family Medicine

## 2022-08-06 ENCOUNTER — Other Ambulatory Visit: Payer: Self-pay

## 2022-08-06 ENCOUNTER — Encounter (HOSPITAL_COMMUNITY): Payer: Self-pay

## 2022-08-06 DIAGNOSIS — F17210 Nicotine dependence, cigarettes, uncomplicated: Secondary | ICD-10-CM | POA: Diagnosis not present

## 2022-08-06 DIAGNOSIS — Z79899 Other long term (current) drug therapy: Secondary | ICD-10-CM | POA: Diagnosis not present

## 2022-08-06 DIAGNOSIS — K311 Adult hypertrophic pyloric stenosis: Secondary | ICD-10-CM | POA: Diagnosis not present

## 2022-08-06 DIAGNOSIS — R109 Unspecified abdominal pain: Secondary | ICD-10-CM | POA: Diagnosis present

## 2022-08-06 DIAGNOSIS — R1013 Epigastric pain: Principal | ICD-10-CM | POA: Diagnosis present

## 2022-08-06 DIAGNOSIS — K219 Gastro-esophageal reflux disease without esophagitis: Secondary | ICD-10-CM | POA: Diagnosis present

## 2022-08-06 DIAGNOSIS — F1510 Other stimulant abuse, uncomplicated: Secondary | ICD-10-CM

## 2022-08-06 DIAGNOSIS — F1210 Cannabis abuse, uncomplicated: Secondary | ICD-10-CM | POA: Diagnosis not present

## 2022-08-06 DIAGNOSIS — K922 Gastrointestinal hemorrhage, unspecified: Secondary | ICD-10-CM | POA: Diagnosis not present

## 2022-08-06 DIAGNOSIS — F1010 Alcohol abuse, uncomplicated: Secondary | ICD-10-CM | POA: Diagnosis present

## 2022-08-06 LAB — CBC WITH DIFFERENTIAL/PLATELET
Abs Immature Granulocytes: 0.08 10*3/uL — ABNORMAL HIGH (ref 0.00–0.07)
Basophils Absolute: 0.1 10*3/uL (ref 0.0–0.1)
Basophils Relative: 1 %
Eosinophils Absolute: 0.1 10*3/uL (ref 0.0–0.5)
Eosinophils Relative: 1 %
HCT: 48.6 % (ref 39.0–52.0)
Hemoglobin: 14.9 g/dL (ref 13.0–17.0)
Immature Granulocytes: 0 %
Lymphocytes Relative: 10 %
Lymphs Abs: 2 10*3/uL (ref 0.7–4.0)
MCH: 22 pg — ABNORMAL LOW (ref 26.0–34.0)
MCHC: 30.7 g/dL (ref 30.0–36.0)
MCV: 71.7 fL — ABNORMAL LOW (ref 80.0–100.0)
Monocytes Absolute: 1.5 10*3/uL — ABNORMAL HIGH (ref 0.1–1.0)
Monocytes Relative: 8 %
Neutro Abs: 15.7 10*3/uL — ABNORMAL HIGH (ref 1.7–7.7)
Neutrophils Relative %: 80 %
Platelets: 341 10*3/uL (ref 150–400)
RBC: 6.78 MIL/uL — ABNORMAL HIGH (ref 4.22–5.81)
RDW: 21.3 % — ABNORMAL HIGH (ref 11.5–15.5)
WBC: 19.4 10*3/uL — ABNORMAL HIGH (ref 4.0–10.5)
nRBC: 0 % (ref 0.0–0.2)

## 2022-08-06 LAB — MAGNESIUM: Magnesium: 1.6 mg/dL — ABNORMAL LOW (ref 1.7–2.4)

## 2022-08-06 LAB — COMPREHENSIVE METABOLIC PANEL
ALT: 14 U/L (ref 0–44)
AST: 17 U/L (ref 15–41)
Albumin: 4.2 g/dL (ref 3.5–5.0)
Alkaline Phosphatase: 74 U/L (ref 38–126)
Anion gap: 10 (ref 5–15)
BUN: 23 mg/dL — ABNORMAL HIGH (ref 6–20)
CO2: 28 mmol/L (ref 22–32)
Calcium: 8.8 mg/dL — ABNORMAL LOW (ref 8.9–10.3)
Chloride: 97 mmol/L — ABNORMAL LOW (ref 98–111)
Creatinine, Ser: 1 mg/dL (ref 0.61–1.24)
GFR, Estimated: >60 mL/min (ref >60)
Glucose, Bld: 131 mg/dL — ABNORMAL HIGH (ref 70–99)
Potassium: 3.4 mmol/L — ABNORMAL LOW (ref 3.5–5.1)
Sodium: 135 mmol/L (ref 135–145)
Total Bilirubin: 0.6 mg/dL (ref 0.3–1.2)
Total Protein: 7.6 g/dL (ref 6.5–8.1)

## 2022-08-06 LAB — ETHANOL: Alcohol, Ethyl (B): <10 mg/dL (ref <10)

## 2022-08-06 LAB — LACTIC ACID, PLASMA: Lactic Acid, Venous: 2.3 mmol/L (ref 0.5–1.9)

## 2022-08-06 LAB — LIPASE, BLOOD: Lipase: 28 U/L (ref 11–51)

## 2022-08-06 LAB — PHOSPHORUS: Phosphorus: 2.9 mg/dL (ref 2.5–4.6)

## 2022-08-06 MED ORDER — SODIUM CHLORIDE 0.9 % IV SOLN: INTRAVENOUS | Status: DC

## 2022-08-06 MED ORDER — ZOLPIDEM TARTRATE 5 MG PO TABS: 5.0000 mg | ORAL_TABLET | Freq: Every evening | ORAL | Status: DC | PRN

## 2022-08-06 MED ORDER — HYDROMORPHONE HCL 1 MG/ML IJ SOLN
1.0000 mg | Freq: Once | INTRAMUSCULAR | Status: AC
  Administered 2022-08-06: 1 mg via INTRAVENOUS
  Filled 2022-08-06: qty 1

## 2022-08-06 MED ORDER — DEXTROSE-NACL 5-0.45 % IV SOLN: INTRAVENOUS | Status: DC

## 2022-08-06 MED ORDER — SODIUM CHLORIDE 0.9% FLUSH: 3.0000 mL | INTRAVENOUS | Status: DC | PRN

## 2022-08-06 MED ORDER — LEVALBUTEROL HCL 0.63 MG/3ML IN NEBU: 0.6300 mg | INHALATION_SOLUTION | Freq: Four times a day (QID) | RESPIRATORY_TRACT | Status: DC | PRN

## 2022-08-06 MED ORDER — IPRATROPIUM BROMIDE 0.02 % IN SOLN: 0.5000 mg | Freq: Four times a day (QID) | RESPIRATORY_TRACT | Status: DC | PRN

## 2022-08-06 MED ORDER — HYDRALAZINE HCL 20 MG/ML IJ SOLN: 10.0000 mg | INTRAMUSCULAR | Status: DC | PRN

## 2022-08-06 MED ORDER — PANTOPRAZOLE SODIUM 40 MG IV SOLR: 40.0000 mg | Freq: Two times a day (BID) | INTRAVENOUS | Status: DC

## 2022-08-06 MED ORDER — HEPARIN SODIUM (PORCINE) 5000 UNIT/ML IJ SOLN: 5000.0000 [IU] | Freq: Three times a day (TID) | INTRAMUSCULAR | Status: DC

## 2022-08-06 MED ORDER — SODIUM CHLORIDE 0.9% FLUSH: 3.0000 mL | Freq: Two times a day (BID) | INTRAVENOUS | Status: DC

## 2022-08-06 MED ORDER — SODIUM CHLORIDE 0.9 % IV BOLUS
1000.0000 mL | Freq: Once | INTRAVENOUS | Status: AC
  Administered 2022-08-06: 1000 mL via INTRAVENOUS

## 2022-08-06 MED ORDER — SODIUM CHLORIDE 0.9 % IV SOLN: 250.0000 mL | INTRAVENOUS | Status: DC | PRN

## 2022-08-06 MED ORDER — PANTOPRAZOLE INFUSION (NEW) - SIMPLE MED
8.0000 mg/h | INTRAVENOUS | Status: DC
  Administered 2022-08-06: 8 mg/h via INTRAVENOUS
  Filled 2022-08-06 (×5): qty 100

## 2022-08-06 MED ORDER — BISACODYL 5 MG PO TBEC: 5.0000 mg | DELAYED_RELEASE_TABLET | Freq: Every day | ORAL | Status: DC | PRN

## 2022-08-06 MED ORDER — SUCRALFATE 1 GM/10ML PO SUSP: 1.0000 g | Freq: Three times a day (TID) | ORAL | Status: DC

## 2022-08-06 MED ORDER — SENNOSIDES-DOCUSATE SODIUM 8.6-50 MG PO TABS: 1.0000 | ORAL_TABLET | Freq: Every evening | ORAL | Status: DC | PRN

## 2022-08-06 MED ORDER — METOCLOPRAMIDE HCL 5 MG/ML IJ SOLN
10.0000 mg | Freq: Three times a day (TID) | INTRAMUSCULAR | Status: DC
  Administered 2022-08-06: 10 mg via INTRAVENOUS
  Filled 2022-08-06: qty 2

## 2022-08-06 MED ORDER — HYDROXYZINE HCL 25 MG PO TABS
25.0000 mg | ORAL_TABLET | Freq: Two times a day (BID) | ORAL | Status: DC
  Administered 2022-08-06: 25 mg via ORAL
  Filled 2022-08-06: qty 1

## 2022-08-06 MED ORDER — ACETAMINOPHEN 325 MG PO TABS: 650.0000 mg | ORAL_TABLET | Freq: Four times a day (QID) | ORAL | Status: DC | PRN

## 2022-08-06 MED ORDER — HYDROMORPHONE HCL 1 MG/ML IJ SOLN
0.5000 mg | INTRAMUSCULAR | Status: DC | PRN
  Administered 2022-08-06: 1 mg via INTRAVENOUS
  Filled 2022-08-06: qty 1

## 2022-08-06 MED ORDER — OXYCODONE HCL 5 MG PO TABS: 5.0000 mg | ORAL_TABLET | ORAL | Status: DC | PRN

## 2022-08-06 MED ORDER — IOHEXOL 300 MG/ML  SOLN
100.0000 mL | Freq: Once | INTRAMUSCULAR | Status: AC | PRN
  Administered 2022-08-06: 100 mL via INTRAVENOUS

## 2022-08-06 MED ORDER — FLEET ENEMA 7-19 GM/118ML RE ENEM: 1.0000 | ENEMA | Freq: Once | RECTAL | Status: DC | PRN

## 2022-08-06 MED ORDER — ONDANSETRON HCL 4 MG PO TABS: 4.0000 mg | ORAL_TABLET | Freq: Four times a day (QID) | ORAL | Status: DC | PRN

## 2022-08-06 MED ORDER — ONDANSETRON HCL 4 MG/2ML IJ SOLN: 4.0000 mg | Freq: Four times a day (QID) | INTRAMUSCULAR | Status: DC | PRN

## 2022-08-06 MED ORDER — ONDANSETRON HCL 4 MG/2ML IJ SOLN
4.0000 mg | Freq: Once | INTRAMUSCULAR | Status: AC
  Administered 2022-08-06: 4 mg via INTRAVENOUS
  Filled 2022-08-06: qty 2

## 2022-08-06 MED ORDER — PANTOPRAZOLE SODIUM 40 MG IV SOLR
40.0000 mg | Freq: Once | INTRAVENOUS | Status: AC
  Administered 2022-08-06: 40 mg via INTRAVENOUS
  Filled 2022-08-06: qty 10

## 2022-08-06 MED ORDER — ACETAMINOPHEN 650 MG RE SUPP: 650.0000 mg | Freq: Four times a day (QID) | RECTAL | Status: DC | PRN

## 2022-08-06 NOTE — Assessment & Plan Note (Signed)
-   Pending stat urine drug screen -Previously patient has admitted to marijuana use

## 2022-08-06 NOTE — Assessment & Plan Note (Signed)
-   N.p.o. -As needed analgesics -IV fluids -IV Protonix

## 2022-08-06 NOTE — H&P (Signed)
History and Physical   Patient: David Miranda                            PCP: Pcp, No                    DOB: January 08, 1986            DOA: 08/06/2022 [id number]             DOS: 08/06/2022, 12:49 PM  Pcp, No  Patient coming from:   HOME  I have personally reviewed patient's medical records, in electronic medical records, including:  [place], and care everywhere.    Chief Complaint:   Chief Complaint  Patient presents with   Abdominal Pain    History of present illness:    Surafel Bulkley is a 37 year old male with extensive history of previous gastric outlet obstruction, with a history significant for Roux-en-Y gastrojejunostomy history of gastroesophageal reflux disease gastric ulcers.... Presenting today with acute onset of upper abdominal pain, multiple episodes of nausea vomiting, nonbloody but black in color. Denies of having any radiation of the pain, constant worse with nausea and vomiting  Note patient had a similar episode November 25th which he was admitted had an EGD and a consultation by general surgery endoscopy they confirmed the jejunostomy characterized by inflammation erythema and ulcerations no strictures. Widely patent jejunal anastomosis .   ED course: No active vomiting was served in ED Blood pressure 123/86, pulse 66, temperature 98.1 F (36.7 C), resp. rate 18, height '6\' 3"'$  (1.905 m), weight 81.6 kg, SpO2 99 %. BMP within normal with exception of potassium 3.4, glucose 131, BUN 23, is, CBC WBC 19.4  Serum alcohol level<10 Pending UDS   CT abdomen pelvis with contrast: MPRESSION:  Surgical changes of previous gastrojejunostomy. The stomach is markedly distended and there is fluid tracking into the distended lower esophagus. In addition there is esophageal wall thickening.  In addition there is slight thickening and adjacent stranding at the anastomosis to the gastrojejunostomy. Please correlate for an inflammatory process. Ulcer disease is not  excluded. Recommend further evaluation appropriate.   Stable cystic lesion in the pancreatic head. This actually is smaller going back to a study of 2020. Benign.     Patient Denies having: Fever, Chills, Cough, SOB, Chest Pain, Abd pain, N/V/D, headache, dizziness, lightheadedness,  Dysuria, Joint pain, rash, open wounds  ED Course:   Blood pressure 123/86, pulse 66, temperature 98.1 F (36.7 C), resp. rate 18, height '6\' 3"'$  (1.905 m), weight 81.6 kg, SpO2 99 %. Abnormal labs;   Review of Systems: As per HPI, otherwise 10 point review of systems were negative.   ----------------------------------------------------------------------------------------------------------------------  No Known Allergies  Home MEDs:  Prior to Admission medications   Medication Sig Start Date End Date Taking? Authorizing Provider  acetaminophen (TYLENOL) 500 MG tablet Take 1,000 mg by mouth 2 (two) times daily as needed for moderate pain.   Yes [provider]  omeprazole (PRILOSEC OTC) 20 MG tablet Take 20 mg by mouth daily as needed (stomach pain.).   Yes [provider]  ferrous sulfate 325 (65 FE) MG EC tablet Take 1 tablet (325 mg total) by mouth 2 (two) times daily with a meal. Patient not taking: Reported on 05/02/2022 09/02/19   Roxan Hockey, MD  hydrOXYzine (ATARAX/VISTARIL) 25 MG tablet Take 1 tablet (25 mg total) by mouth 2 (two) times daily. Patient not taking: Reported on  05/02/2022 09/02/19   Roxan Hockey, MD  linaclotide Rolan Lipa) 145 MCG CAPS capsule Take 1 capsule (145 mcg total) by mouth daily before breakfast. Patient not taking: Reported on 05/02/2022 09/03/19   Roxan Hockey, MD  lipase/protease/amylase (CREON) 36000 UNITS CPEP capsule Take 2 capsules (72,000 Units total) by mouth 3 (three) times daily with meals. Patient not taking: Reported on 05/02/2022 09/02/19   Roxan Hockey, MD  omeprazole (PRILOSEC) 40 MG capsule Take 1 capsule (40 mg total) by mouth  2 (two) times daily. Patient not taking: Reported on 05/02/2022 09/02/19   Roxan Hockey, MD  pantoprazole (PROTONIX) 40 MG tablet Take 1 tablet (40 mg total) by mouth 2 (two) times daily. Patient not taking: Reported on 08/06/2022 10/16/20   Milton Ferguson, MD  sucralfate (CARAFATE) 1 GM/10ML suspension Take 10 mLs (1 g total) by mouth 4 (four) times daily -  with meals and at bedtime. Patient not taking: Reported on 05/02/2022 10/16/20   Milton Ferguson, MD  traZODone (DESYREL) 150 MG tablet Take 1 tablet (150 mg total) by mouth at bedtime. Patient not taking: Reported on 05/02/2022 09/02/19   Roxan Hockey, MD    PRN MEDs: sodium chloride, acetaminophen **OR** acetaminophen, bisacodyl, hydrALAZINE, HYDROmorphone (DILAUDID) injection, ipratropium, levalbuterol, ondansetron **OR** ondansetron (ZOFRAN) IV, oxyCODONE, senna-docusate, sodium chloride flush, sodium phosphate, zolpidem  Past Medical History:  Diagnosis Date   GERD (gastroesophageal reflux disease)    Pancreatitis     Past Surgical History:  Procedure Laterality Date   BIOPSY  09/01/2019   Procedure: BIOPSY;  Surgeon: Daneil Dolin, MD;  Location: [facility];  Service: Endoscopy;;  Gastric Biopsy for H. Pylori    ESOPHAGOGASTRODUODENOSCOPY (EGD) WITH PROPOFOL N/A 04/01/2019   Procedure: ESOPHAGOGASTRODUODENOSCOPY (EGD) WITH PROPOFOL;  Surgeon: Danie Binder, MD;  Location: [facility];  Service: Endoscopy;  Laterality: N/A;   ESOPHAGOGASTRODUODENOSCOPY (EGD) WITH PROPOFOL N/A 09/01/2019   Procedure: ESOPHAGOGASTRODUODENOSCOPY (EGD) WITH PROPOFOL;  Surgeon: Daneil Dolin, MD;  Location: [facility];  Service: Endoscopy;  Laterality: N/A;   ESOPHAGOGASTRODUODENOSCOPY (EGD) WITH PROPOFOL N/A 05/02/2022   Procedure: ESOPHAGOGASTRODUODENOSCOPY (EGD) WITH PROPOFOL;  Surgeon: Harvel Quale, MD;  Location: [facility];  Service: Gastroenterology;  Laterality: N/A;  with dilation   GASTROJEJUNOSTOMY N/A  04/04/2019   Procedure: GASTROJEJUNOSTOMY;  Surgeon: Virl Cagey, MD;  Location: [facility];  Service: General;  Laterality: N/A;   GASTROJEJUNOSTOMY  04/14/2019   Procedure: REVISION GASTROJEJUNOSTOMY TO ROUX-EN-Y;  Surgeon: Virl Cagey, MD;  Location: [facility];  Service: General;;   NO PAST SURGERIES     TESTICLE SURGERY       reports that he has been smoking cigarettes. He has been smoking an average of 1 pack per day. He has never used smokeless tobacco. He reports that he does not currently use alcohol. He reports current drug use. Drugs: Marijuana and Cocaine.   Family History  Problem Relation Age of Onset   Cancer Other     Physical Exam:   Vitals:   08/06/22 1115 08/06/22 1130 08/06/22 1200 08/06/22 1220  BP:  (!) 132/95 (!) 130/117 123/86  Pulse: 69 67 82 66  Resp:    18  Temp:    98.1 F (36.7 C)  TempSrc:      SpO2: 98% 98% 100% 99%  Weight:      Height:       Constitutional: NAD, calm, comfortable Eyes: PERRL, lids and conjunctivae normal ENMT: Mucous membranes are moist. Posterior pharynx clear of  any exudate or lesions.Normal dentition.  Neck: normal, supple, no masses, no thyromegaly Respiratory: clear to auscultation bilaterally, no wheezing, no crackles. Normal respiratory effort. No accessory muscle use.  Cardiovascular: Regular rate and rhythm, no murmurs / rubs / gallops. No extremity edema. 2+ pedal pulses. No carotid bruits.  Abdomen: no tenderness, no masses palpated. No hepatosplenomegaly. Bowel sounds positive.  Musculoskeletal: no clubbing / cyanosis. No joint deformity upper and lower extremities. Good ROM, no contractures. Normal muscle tone.  Neurologic: CN II-XII grossly intact. Sensation intact, DTR normal. Strength 5/5 in all 4.  Psychiatric: Normal judgment and insight. Alert and oriented x 3. Normal mood.  Skin: no rashes, lesions, ulcers. No induration Decubitus/ulcers:  Wounds: per nursing documentation         Labs on  admission:    I have personally reviewed following labs and imaging studies  CBC: Recent Labs  Lab 08/06/22 0850  WBC 19.4*  NEUTROABS 15.7*  HGB 14.9  HCT 48.6  MCV 71.7*  PLT [id number]   Basic Metabolic Panel: Recent Labs  Lab 08/06/22 0953  NA 135  K 3.4*  CL 97*  CO2 28  GLUCOSE 131*  BUN 23*  CREATININE 1.00  CALCIUM 8.8*   GFR: Estimated Creatinine Clearance: 117.9 mL/min (by C-G formula based on SCr of 1 mg/dL). Liver Function Tests: Recent Labs  Lab 08/06/22 0953  AST 17  ALT 14  ALKPHOS 74  BILITOT 0.6  PROT 7.6  ALBUMIN 4.2   Recent Labs  Lab 08/06/22 0953  LIPASE 28   No results for input(s): "AMMONIA" in the last 168 hours. Coagulation Profile: No results for input(s): "INR", "PROTIME" in the last 168 hours. Cardiac Enzymes: No results for input(s): "CKTOTAL", "CKMB", "CKMBINDEX", "TROPONINI" in the last 168 hours. BNP (last 3 results) No results for input(s): "PROBNP" in the last 8760 hours. HbA1C: No results for input(s): "HGBA1C" in the last 72 hours. CBG: No results for input(s): "GLUCAP" in the last 168 hours. Lipid Profile: No results for input(s): "CHOL", "HDL", "LDLCALC", "TRIG", "CHOLHDL", "LDLDIRECT" in the last 72 hours. Thyroid Function Tests: No results for input(s): "TSH", "T4TOTAL", "FREET4", "T3FREE", "THYROIDAB" in the last 72 hours. Anemia Panel: No results for input(s): "VITAMINB12", "FOLATE", "FERRITIN", "TIBC", "IRON", "RETICCTPCT" in the last 72 hours. Urine analysis:    Component Value Date/Time   COLORURINE YELLOW 10/16/2020 0806   APPEARANCEUR CLEAR 10/16/2020 0806   LABSPEC >1.046 (H) 10/16/2020 0806   PHURINE 7.0 10/16/2020 0806   GLUCOSEU NEGATIVE 10/16/2020 0806   HGBUR NEGATIVE 10/16/2020 0806   BILIRUBINUR NEGATIVE 10/16/2020 0806   KETONESUR NEGATIVE 10/16/2020 0806   PROTEINUR NEGATIVE 10/16/2020 0806   NITRITE NEGATIVE 10/16/2020 0806   LEUKOCYTESUR NEGATIVE 10/16/2020 0806    Last A1C:  Lab  Results  Component Value Date   HGBA1C 5.2 12/27/2017     Radiologic Exams on Admission:   CT Abdomen Pelvis W Contrast  Result Date: 08/06/2022 CLINICAL DATA:  Mid abdominal umbilical pain. EXAM: CT ABDOMEN AND PELVIS WITH CONTRAST TECHNIQUE: Multidetector CT imaging of the abdomen and pelvis was performed using the standard protocol following bolus administration of intravenous contrast. RADIATION DOSE REDUCTION: This exam was performed according to the departmental dose-optimization program which includes automated exposure control, adjustment of the mA and/or kV according to patient size and/or use of iterative reconstruction technique. CONTRAST:  16m OMNIPAQUE IOHEXOL 300 MG/ML  SOLN COMPARISON:  X-ray and CT 04/30/2022. Multiple older CT scans as well FINDINGS: Lower chest: There is some linear  opacity lung bases likely scar or atelectasis. No pleural effusion. Breathing motion. There is significant wall thickening along the lower esophagus with a patulous lumen. Please correlate with symptoms including for esophagitis. Hepatobiliary: No space-occupying liver lesion. Gallbladder is nondilated. Patent portal vein. Pancreas: Slight fullness seen in the area of the pancreatic head, unchanged from previous. There is a small cystic areas as well in the pancreatic head on series 2 image 38 measuring 9 mm. This was larger in December 2020 measuring up to 20 mm. Benign. Spleen: Normal in size without focal abnormality. Adrenals/Urinary Tract: The adrenal glands are preserved. No enhancing renal mass or collecting system dilatation. Bosniak 1 simple left-sided renal cyst is also stable centrally and posterior measuring 9 mm in diameter. No specific imaging follow-up. The ureters have normal caliber extending down to the urinary bladder. The bladder has preserved contour. Stomach/Bowel: Large bowel is of normal course and caliber with scattered stool. Normal retrocecal appendix. Stomach is distended with  fluid and debris. There is surgical changes along the inferior margin of the anterior of the stomach with a gastrojejunostomy. There is wall thickening and some stranding in this location as best seen on coronal image 34 of series 5. Mild stranding is increased from previous. Proximal duodenal is distended. Distal second and rest of the duodenal is normal caliber. These changes were seen previously. The jejunum and ileum are nondilated. Vascular/Lymphatic: Normal caliber aorta and IVC. No specific abnormal lymph node enlargement identified in the abdomen and pelvis. Reproductive: Prostate is unremarkable. Other: No abdominal wall hernia or abnormality. No abdominopelvic ascites. Musculoskeletal: No acute or significant osseous findings. IMPRESSION: Surgical changes of previous gastrojejunostomy. The stomach is markedly distended and there is fluid tracking into the distended lower esophagus. In addition there is esophageal wall thickening. Please correlate for any evidence of esophagitis. In addition there is slight thickening and adjacent stranding at the anastomosis to the gastrojejunostomy. Please correlate for an inflammatory process. Ulcer disease is not excluded. Recommend further evaluation appropriate. Stable cystic lesion in the pancreatic head. This actually is smaller going back to a study of 2020. Benign. No specific additional imaging follow-up. Normal appendix Electronically Signed   By: Jill Side M.D.   On: 08/06/2022 11:11    EKG:   Independently reviewed.  Orders placed or performed during the hospital encounter of 08/06/22   EKG 12-Lead   ---------------------------------------------------------------------------------------------------------------------------------------    Assessment / Plan:   Principal Problem:   Intractable abdominal pain Active Problems:   Abdominal pain, epigastric   Alcohol abuse   GERD (gastroesophageal reflux disease)   Cannabis abuse   Gastric  outflow obstruction   Assessment and Plan: * Intractable abdominal pain -With a previous history of gastric outlet obstruction, -H/o for Roux-en-Y gastrojejunostomy history of GERD,disease gastric ulcers -Recent hospitalization for similar episodes status post EGD finding inflammation--- esophagitis otherwise patent jejunal anastomosis .  -Admitted for close observation -Gastroenterologist consulted for further evaluation and recommendation possible EGD in AM   -Initiated IV Protonix 40 twice daily, - IV fluids -N.p.o. -G-tube placement  Abdominal pain, epigastric - N.p.o. -As needed analgesics -IV fluids -IV Protonix  Cannabis abuse - Pending stat urine drug screen -Previously patient has admitted to marijuana use  GERD (gastroesophageal reflux disease) -IV Protonix  Alcohol abuse - Serum alcohol level less than 10 -Monitoring closely for any withdrawals -Currently patient denies alcohol use or abuse  Gastric outflow obstruction -Roux-en-Y gastrojejunostomy history of GERD,disease gastric ulcers Patent jejunal anastomosis on last EGD April 29, 2022      Consults called: Gastroenterologist -------------------------------------------------------------------------------------------------------------------------------------------- DVT prophylaxis:  heparin injection 5,000 Units Start: 08/06/22 1400 TED hose Start: 08/06/22 1233 SCDs Start: 08/06/22 1233   Code Status:   Code Status: Full Code   Admission status: Patient will be admitted as Observation, with a greater than 2 midnight length of stay. Level of care: Telemetry   Family Communication:  none at bedside  (The above findings and plan of care has been discussed with patient in detail, the patient expressed understanding and agreement of above plan)   --------------------------------------------------------------------------------------------------------------------------------------------------  Disposition Plan:  Anticipated 1-2 days Status is: Observation The patient remains OBS appropriate and will d/c before 2 midnights.     ----------------------------------------------------------------------------------------------------------------------------------------------------  Time spent: > than  54  Min.  Was spent seeing and evaluating the patient, reviewing all medical records, drawn plan of care.  SIGNED: Deatra James, MD, FHM. FAAFP. [facility], Pager  (Please use [email or web address] to page/ or secure chat through epic) If 7PM-7AM, please contact night-coverage [email or web address],  08/06/2022, 12:49 PM

## 2022-08-06 NOTE — ED Triage Notes (Addendum)
Patient states he has been out of his protonix "for a while." Patient states waking up yesterday with abdominal pain around his umbilicus. Patient states this has happened before. Patient states he has been nausea with vomiting. Patient states trying Tylenol at home with no relief.

## 2022-08-06 NOTE — Assessment & Plan Note (Addendum)
-  Roux-en-Y gastrojejunostomy history of GERD,disease gastric ulcers Patent jejunal anastomosis on last EGD April 29, 2022

## 2022-08-06 NOTE — Assessment & Plan Note (Addendum)
-  With a previous history of gastric outlet obstruction, -H/o for Roux-en-Y gastrojejunostomy history of GERD,disease gastric ulcers -Recent hospitalization for similar episodes status post EGD finding inflammation--- esophagitis otherwise patent jejunal anastomosis .  -Admitted for close observation -Gastroenterologist consulted for further evaluation and recommendation possible EGD in AM   -Initiated IV Protonix 40 twice daily, - IV fluids -N.p.o. -G-tube placement

## 2022-08-06 NOTE — Hospital Course (Addendum)
David Miranda is a 37 year old male with extensive history of previous gastric outlet obstruction, with a history significant for Roux-en-Y gastrojejunostomy history of gastroesophageal reflux disease gastric ulcers.... Presenting today with acute onset of upper abdominal pain, multiple episodes of nausea vomiting, nonbloody but black in color. Denies of having any radiation of the pain, constant worse with nausea and vomiting  Note patient had a similar episode November 25th which he was admitted had an EGD and a consultation by general surgery endoscopy they confirmed the jejunostomy characterized by inflammation erythema and ulcerations no strictures. Widely patent jejunal anastomosis .   ED course: No active vomiting was served in ED Blood pressure 123/86, pulse 66, temperature 98.1 F (36.7 C), resp. rate 18, height '6\' 3"'$  (1.905 m), weight 81.6 kg, SpO2 99 %. BMP within normal with exception of potassium 3.4, glucose 131, BUN 23, is, CBC WBC 19.4  Serum alcohol level<10 Pending UDS   CT abdomen pelvis with contrast: MPRESSION:  Surgical changes of previous gastrojejunostomy. The stomach is markedly distended and there is fluid tracking into the distended lower esophagus. In addition there is esophageal wall thickening.  In addition there is slight thickening and adjacent stranding at the anastomosis to the gastrojejunostomy. Please correlate for an inflammatory process. Ulcer disease is not excluded. Recommend further evaluation appropriate.   Stable cystic lesion in the pancreatic head. This actually is smaller going back to a study of 2020. Benign.

## 2022-08-06 NOTE — Assessment & Plan Note (Signed)
-  IV Protonix

## 2022-08-06 NOTE — ED Notes (Signed)
Patient transported to CT 

## 2022-08-06 NOTE — ED Provider Notes (Addendum)
[facility] Provider Note   CSN: [id number] Arrival date & time: 08/06/22  [id number]     History  Chief Complaint  Patient presents with   Abdominal Pain    David Miranda is a 37 y.o. male.  Patient with the onset of upper abdominal pain and pain around his umbilicus at [id number] last evening.  Multiple episodes of nausea vomiting.  Patient says that it was black in color.  But no vomiting here.  Past medical history is significant for Roux-en-Y gastrojejunostomy history of gastroesophageal reflux disease gastric ulcers.  Patient presented and had an admission in November 25 for very similar presentation was consulted on by gastroenterology and general surgery.  Patient had an upper endoscopy done by Dr. Luvenia Redden.  With that upper endoscopy they confirmed the jejunostomy characterized by inflammation erythema and ulcerations no strictures.  Widely patent jejunal anastomosis he was treated with PPIs twice daily started on Carafate.  Patient states that he has not been taking his Protonix.  Past medical history significant for pancreatitis gastroesophageal reflux disease.  Had jejunostomy done October 2020.  With a revision done of November 2020.       Home Medications Prior to Admission medications   Medication Sig Start Date End Date Taking? Authorizing Provider  acetaminophen (TYLENOL) 325 MG tablet Take 2 tablets (650 mg total) by mouth every 6 (six) hours as needed for mild pain (or Fever >/= 101). Patient not taking: Reported on 05/02/2022 09/02/19   Roxan Hockey, MD  calcium carbonate (TUMS - DOSED IN MG ELEMENTAL CALCIUM) 500 MG chewable tablet Chew 2 tablets (400 mg of elemental calcium total) by mouth daily as needed for indigestion or heartburn. Patient not taking: Reported on 05/02/2022 09/02/19   Roxan Hockey, MD  ferrous sulfate 325 (65 FE) MG EC tablet Take 1 tablet (325 mg total) by mouth 2 (two) times daily with a  meal. Patient not taking: Reported on 05/02/2022 09/02/19   Roxan Hockey, MD  hydrOXYzine (ATARAX/VISTARIL) 25 MG tablet Take 1 tablet (25 mg total) by mouth 2 (two) times daily. Patient not taking: Reported on 05/02/2022 09/02/19   Roxan Hockey, MD  linaclotide [facility]) 145 MCG CAPS capsule Take 1 capsule (145 mcg total) by mouth daily before breakfast. Patient not taking: Reported on 05/02/2022 09/03/19   Roxan Hockey, MD  lipase/protease/amylase (CREON) 36000 UNITS CPEP capsule Take 2 capsules (72,000 Units total) by mouth 3 (three) times daily with meals. Patient not taking: Reported on 05/02/2022 09/02/19   Roxan Hockey, MD  Multiple Vitamin (MULTIVITAMIN WITH MINERALS) TABS tablet Take 1 tablet by mouth daily. Patient not taking: Reported on 05/02/2022 09/02/19   Roxan Hockey, MD  nicotine (NICODERM CQ - DOSED IN MG/24 HOURS) 14 mg/24hr patch Place 1 patch (14 mg total) onto the skin daily. Patient not taking: Reported on 05/02/2022 09/03/19   Roxan Hockey, MD  omeprazole (PRILOSEC) 40 MG capsule Take 1 capsule (40 mg total) by mouth 2 (two) times daily. Patient not taking: Reported on 05/02/2022 09/02/19   Roxan Hockey, MD  oxyCODONE-acetaminophen (PERCOCET) 5-325 MG tablet Take 1 tablet by mouth every 6 (six) hours as needed. Patient not taking: Reported on 05/02/2022 10/16/20   Milton Ferguson, MD  pantoprazole (PROTONIX) 40 MG tablet Take 1 tablet (40 mg total) by mouth 2 (two) times daily. 10/16/20   Milton Ferguson, MD  polyethylene glycol (MIRALAX / GLYCOLAX) 17 g packet Take 17 g by mouth daily. Patient not taking: Reported on  05/02/2022 09/02/19   Roxan Hockey, MD  sucralfate (CARAFATE) 1 GM/10ML suspension Take 10 mLs (1 g total) by mouth 4 (four) times daily -  with meals and at bedtime. Patient not taking: Reported on 05/02/2022 10/16/20   Milton Ferguson, MD  traZODone (DESYREL) 150 MG tablet Take 1 tablet (150 mg total) by mouth at bedtime. Patient not  taking: Reported on 05/02/2022 09/02/19   Roxan Hockey, MD      Allergies    Patient has no known allergies.    Review of Systems   Review of Systems  Constitutional:  Negative for chills and fever.  HENT:  Negative for ear pain and sore throat.   Eyes:  Negative for pain and visual disturbance.  Respiratory:  Negative for cough and shortness of breath.   Cardiovascular:  Negative for chest pain and palpitations.  Gastrointestinal:  Positive for abdominal pain, nausea and vomiting.  Genitourinary:  Negative for dysuria and hematuria.  Musculoskeletal:  Negative for arthralgias and back pain.  Skin:  Negative for color change and rash.  Neurological:  Negative for seizures and syncope.  All other systems reviewed and are negative.   Physical Exam Updated Vital Signs BP (!) 132/95   Pulse 67   Temp 98.1 F (36.7 C) (Oral)   Resp 18   Ht 1.905 m ('6\' 3"'$ )   Wt 81.6 kg   SpO2 98%   BMI 22.50 kg/m  Physical Exam Vitals and nursing note reviewed.  Constitutional:      General: He is not in acute distress.    Appearance: Normal appearance. He is well-developed.  HENT:     Head: Normocephalic and atraumatic.     Mouth/Throat:     Mouth: Mucous membranes are dry.  Eyes:     Extraocular Movements: Extraocular movements intact.     Conjunctiva/sclera: Conjunctivae normal.     Pupils: Pupils are equal, round, and reactive to light.  Cardiovascular:     Rate and Rhythm: Normal rate and regular rhythm.     Heart sounds: No murmur heard. Pulmonary:     Effort: Pulmonary effort is normal. No respiratory distress.     Breath sounds: Normal breath sounds.  Abdominal:     Palpations: Abdomen is soft.     Tenderness: There is abdominal tenderness.     Comments: High tenderness epigastric area  Musculoskeletal:        General: No swelling.     Cervical back: Neck supple.  Skin:    General: Skin is warm and dry.     Capillary Refill: Capillary refill takes less than 2 seconds.   Neurological:     General: No focal deficit present.     Mental Status: He is alert and oriented to person, place, and time.  Psychiatric:        Mood and Affect: Mood normal.     ED Results / Procedures / Treatments   Labs (all labs ordered are listed, but only abnormal results are displayed) Labs Reviewed  CBC WITH DIFFERENTIAL/PLATELET - Abnormal; Notable for the following components:      Result Value   WBC 19.4 (*)    RBC 6.78 (*)    MCV 71.7 (*)    MCH 22.0 (*)    RDW 21.3 (*)    Neutro Abs 15.7 (*)    Monocytes Absolute 1.5 (*)    Abs Immature Granulocytes 0.08 (*)    All other components within normal limits  COMPREHENSIVE METABOLIC PANEL - Abnormal;  Notable for the following components:   Potassium 3.4 (*)    Chloride 97 (*)    Glucose, Bld 131 (*)    BUN 23 (*)    Calcium 8.8 (*)    All other components within normal limits  ETHANOL  LIPASE, BLOOD    EKG None  Radiology CT Abdomen Pelvis W Contrast  Result Date: 08/06/2022 CLINICAL DATA:  Mid abdominal umbilical pain. EXAM: CT ABDOMEN AND PELVIS WITH CONTRAST TECHNIQUE: Multidetector CT imaging of the abdomen and pelvis was performed using the standard protocol following bolus administration of intravenous contrast. RADIATION DOSE REDUCTION: This exam was performed according to the departmental dose-optimization program which includes automated exposure control, adjustment of the mA and/or kV according to patient size and/or use of iterative reconstruction technique. CONTRAST:  153m OMNIPAQUE IOHEXOL 300 MG/ML  SOLN COMPARISON:  X-ray and CT 04/30/2022. Multiple older CT scans as well FINDINGS: Lower chest: There is some linear opacity lung bases likely scar or atelectasis. No pleural effusion. Breathing motion. There is significant wall thickening along the lower esophagus with a patulous lumen. Please correlate with symptoms including for esophagitis. Hepatobiliary: No space-occupying liver lesion. Gallbladder is  nondilated. Patent portal vein. Pancreas: Slight fullness seen in the area of the pancreatic head, unchanged from previous. There is a small cystic areas as well in the pancreatic head on series 2 image 38 measuring 9 mm. This was larger in December 2020 measuring up to 20 mm. Benign. Spleen: Normal in size without focal abnormality. Adrenals/Urinary Tract: The adrenal glands are preserved. No enhancing renal mass or collecting system dilatation. Bosniak 1 simple left-sided renal cyst is also stable centrally and posterior measuring 9 mm in diameter. No specific imaging follow-up. The ureters have normal caliber extending down to the urinary bladder. The bladder has preserved contour. Stomach/Bowel: Large bowel is of normal course and caliber with scattered stool. Normal retrocecal appendix. Stomach is distended with fluid and debris. There is surgical changes along the inferior margin of the anterior of the stomach with a gastrojejunostomy. There is wall thickening and some stranding in this location as best seen on coronal image 34 of series 5. Mild stranding is increased from previous. Proximal duodenal is distended. Distal second and rest of the duodenal is normal caliber. These changes were seen previously. The jejunum and ileum are nondilated. Vascular/Lymphatic: Normal caliber aorta and IVC. No specific abnormal lymph node enlargement identified in the abdomen and pelvis. Reproductive: Prostate is unremarkable. Other: No abdominal wall hernia or abnormality. No abdominopelvic ascites. Musculoskeletal: No acute or significant osseous findings. IMPRESSION: Surgical changes of previous gastrojejunostomy. The stomach is markedly distended and there is fluid tracking into the distended lower esophagus. In addition there is esophageal wall thickening. Please correlate for any evidence of esophagitis. In addition there is slight thickening and adjacent stranding at the anastomosis to the gastrojejunostomy. Please  correlate for an inflammatory process. Ulcer disease is not excluded. Recommend further evaluation appropriate. Stable cystic lesion in the pancreatic head. This actually is smaller going back to a study of 2020. Benign. No specific additional imaging follow-up. Normal appendix Electronically Signed   By: AJill SideM.D.   On: 08/06/2022 11:11    Procedures Procedures    Medications Ordered in ED Medications  0.9 %  sodium chloride infusion (has no administration in time range)  sodium chloride 0.9 % bolus 1,000 mL (1,000 mLs Intravenous Bolus from Bag 08/06/22 0843)  ondansetron (ZOFRAN) injection 4 mg (4 mg Intravenous Given 08/06/22 0844)  pantoprazole (PROTONIX) injection 40 mg (40 mg Intravenous Given 08/06/22 0846)  HYDROmorphone (DILAUDID) injection 1 mg (1 mg Intravenous Given 08/06/22 0847)  iohexol (OMNIPAQUE) 300 MG/ML solution 100 mL (100 mLs Intravenous Contrast Given 08/06/22 1043)    ED Course/ Medical Decision Making/ A&P                             Medical Decision Making Amount and/or Complexity of Data Reviewed Labs: ordered. Radiology: ordered.  Risk Prescription drug management. Decision regarding hospitalization.  Patient's alcohol level less than 10.  History of some alcohol abuse in the past.  Complete metabolic panel potassium 3.4 glucose 131 BUN 21 GFR greater than 60 liver function test normal.  Anion gap normal.  Lipase normal at 28.  CBC white blood cell 19.4 hemoglobin 14.9 platelets 341.  The differential shows mostly absolute neutrophils.  CT scan of the abdomen and pelvis surgical changes of previous gastrojejunostomy the stomach is markedly distended and there is fluid tracking into the distended lower esophagus.  In addition there is esophageal wall thickening.  In addition there straight thickening and adjacent stranding at the anastomosis of the jejunostomy ulcer disease not excluded.  Symptoms and CT could be consistent with a gastric outlet obstruction.   Or peptic ulcer disease.  Will discuss with on-call gastroenterology.  Patient treated here with fluids antinausea medicine and Protonix.  Feel that he probably needs admission so we will go ahead and start Protonix drip.  States that he had vomited some black stuff does not sound like coffee-ground.  But no vomiting here.  Hemoglobin stable.  Discussed with Dr. Jenetta Downer from gastroenterology.  He will see the patient is thinking in terms of scoping him tomorrow.  Recommending hospitalist admission.  Will place NG tube.  Will get urine drug screen.  Patient with some improvement with the medications but is still having lots of abdominal pain.   Final Clinical Impression(s) / ED Diagnoses Final diagnoses:  Epigastric pain  Gastric outlet obstruction    Rx / DC Orders ED Discharge Orders     None         Fredia Sorrow, MD 08/06/22 1201    Fredia Sorrow, MD 08/06/22 1224

## 2022-08-06 NOTE — Discharge Summary (Signed)
Physician Discharge Summary   Patient: David Miranda MRN: [id number] DOB: [birth year]  Admit date:     08/06/2022  Discharge date: 08/06/22  Discharge Physician: Deatra James   PCP: Pcp, No   Discussed with patient in detail that he needs to stay for further evaluation    He is adamant he would like to leave [place]   Left AGAINST MEDICAL ADVICE  Recommendations at discharge:   Follow-up with PCP, gastroenterologist as soon as possible  Discharge Diagnoses: Principal Problem:   Intractable abdominal pain Active Problems:   Abdominal pain, epigastric   Alcohol abuse   GERD (gastroesophageal reflux disease)   Cannabis abuse   Gastric outflow obstruction  Resolved Problems:   Abdominal pain  Hospital Course: David Miranda is a 37 year old male with extensive history of previous gastric outlet obstruction, with a history significant for Roux-en-Y gastrojejunostomy history of gastroesophageal reflux disease gastric ulcers.... Presenting today with acute onset of upper abdominal pain, multiple episodes of nausea vomiting, nonbloody but black in color. Denies of having any radiation of the pain, constant worse with nausea and vomiting  Note patient had a similar episode November 25th which he was admitted had an EGD and a consultation by general surgery endoscopy they confirmed the jejunostomy characterized by inflammation erythema and ulcerations no strictures. Widely patent jejunal anastomosis .   ED course: No active vomiting was served in ED Blood pressure 123/86, pulse 66, temperature 98.1 F (36.7 C), resp. rate 18, height '6\' 3"'$  (1.905 m), weight 81.6 kg, SpO2 99 %. BMP within normal with exception of potassium 3.4, glucose 131, BUN 23, is, CBC WBC 19.4  Serum alcohol level<10 Pending UDS   CT abdomen pelvis with contrast: MPRESSION:  Surgical changes of previous gastrojejunostomy. The stomach is markedly distended and there is fluid tracking into  the distended lower esophagus. In addition there is esophageal wall thickening.  In addition there is slight thickening and adjacent stranding at the anastomosis to the gastrojejunostomy. Please correlate for an inflammatory process. Ulcer disease is not excluded. Recommend further evaluation appropriate.   Stable cystic lesion in the pancreatic head. This actually is smaller going back to a study of 2020. Benign.   Assessment and Plan: * Intractable abdominal pain -With a previous history of gastric outlet obstruction, -H/o for Roux-en-Y gastrojejunostomy history of GERD,disease gastric ulcers -Recent hospitalization for similar episodes status post EGD finding inflammation--- esophagitis otherwise patent jejunal anastomosis .  -Admitted for close observation -Gastroenterologist consulted for further evaluation and recommendation possible EGD in AM   -Initiated IV Protonix 40 twice daily, - IV fluids -N.p.o.   Abdominal pain, epigastric - N.p.o. -As needed analgesics -IV fluids -IV Protonix  Patient is adamant that he would like to leave [place]   Pros and cons including worsening condition and death has been explained to the patient in detail   Stating that he will go with his mom to whichever hospital closest if he gets worse     DISCHARGE MEDICATION:   Discharge Exam: Filed Weights   08/06/22 0819 08/06/22 1300  Weight: 81.6 kg 80.6 kg        General:  AAO x 3,  cooperative, no distress;   HEENT:  Normocephalic, PERRL, otherwise with in Normal limits   Neuro:  CNII-XII intact. , normal motor and sensation, reflexes intact   Lungs:   Clear to auscultation BL, Respirations unlabored,  No wheezes / crackles  Cardio:    S1/S2, RRR, No murmure, No  Rubs or Gallops   Abdomen:  Soft, non-tender, bowel sounds active all four quadrants, no guarding or peritoneal signs.  Muscular  skeletal:  Limited exam -global generalized weaknesses - in bed,  able to move all 4 extremities,   2+ pulses,  symmetric, No pitting edema  Skin:  Dry, warm to touch, negative for any Rashes,  Wounds: Please see nursing documentation          Condition at discharge: fair  The results of significant diagnostics from this hospitalization (including imaging, microbiology, ancillary and laboratory) are listed below for reference.   Imaging Studies: CT Abdomen Pelvis W Contrast  Result Date: 08/06/2022 CLINICAL DATA:  Mid abdominal umbilical pain. EXAM: CT ABDOMEN AND PELVIS WITH CONTRAST TECHNIQUE: Multidetector CT imaging of the abdomen and pelvis was performed using the standard protocol following bolus administration of intravenous contrast. RADIATION DOSE REDUCTION: This exam was performed according to the departmental dose-optimization program which includes automated exposure control, adjustment of the mA and/or kV according to patient size and/or use of iterative reconstruction technique. CONTRAST:  165m OMNIPAQUE IOHEXOL 300 MG/ML  SOLN COMPARISON:  X-ray and CT 04/30/2022. Multiple older CT scans as well FINDINGS: Lower chest: There is some linear opacity lung bases likely scar or atelectasis. No pleural effusion. Breathing motion. There is significant wall thickening along the lower esophagus with a patulous lumen. Please correlate with symptoms including for esophagitis. Hepatobiliary: No space-occupying liver lesion. Gallbladder is nondilated. Patent portal vein. Pancreas: Slight fullness seen in the area of the pancreatic head, unchanged from previous. There is a small cystic areas as well in the pancreatic head on series 2 image 38 measuring 9 mm. This was larger in December 2020 measuring up to 20 mm. Benign. Spleen: Normal in size without focal abnormality. Adrenals/Urinary Tract: The adrenal glands are preserved. No enhancing renal mass or collecting system dilatation. Bosniak 1 simple left-sided renal cyst is also stable centrally and posterior  measuring 9 mm in diameter. No specific imaging follow-up. The ureters have normal caliber extending down to the urinary bladder. The bladder has preserved contour. Stomach/Bowel: Large bowel is of normal course and caliber with scattered stool. Normal retrocecal appendix. Stomach is distended with fluid and debris. There is surgical changes along the inferior margin of the anterior of the stomach with a gastrojejunostomy. There is wall thickening and some stranding in this location as best seen on coronal image 34 of series 5. Mild stranding is increased from previous. Proximal duodenal is distended. Distal second and rest of the duodenal is normal caliber. These changes were seen previously. The jejunum and ileum are nondilated. Vascular/Lymphatic: Normal caliber aorta and IVC. No specific abnormal lymph node enlargement identified in the abdomen and pelvis. Reproductive: Prostate is unremarkable. Other: No abdominal wall hernia or abnormality. No abdominopelvic ascites. Musculoskeletal: No acute or significant osseous findings. IMPRESSION: Surgical changes of previous gastrojejunostomy. The stomach is markedly distended and there is fluid tracking into the distended lower esophagus. In addition there is esophageal wall thickening. Please correlate for any evidence of esophagitis. In addition there is slight thickening and adjacent stranding at the anastomosis to the gastrojejunostomy. Please correlate for an inflammatory process. Ulcer disease is not excluded. Recommend further evaluation appropriate. Stable cystic lesion in the pancreatic head. This actually is smaller going back to a study of 2020. Benign. No specific additional imaging follow-up. Normal appendix Electronically Signed   By: AJill SideM.D.   On: 08/06/2022 11:11    Microbiology: Results for orders placed  or performed during the hospital encounter of 04/29/22  Culture, blood (Routine X 2) w Reflex to ID Panel     Status: None   Collection  Time: 04/30/22  6:22 AM   Specimen: Right Antecubital; Blood  Result Value Ref Range Status   Specimen Description   Final    RIGHT ANTECUBITAL BOTTLES DRAWN AEROBIC AND ANAEROBIC   Special Requests   Final    Blood Culture results may not be optimal due to an excessive volume of blood received in culture bottles   Culture   Final    NO GROWTH 5 DAYS Performed at [facility], [address], [place], [place] [address]    Report Status 05/05/2022 FINAL  Final  Culture, blood (Routine X 2) w Reflex to ID Panel     Status: None   Collection Time: 04/30/22  6:36 AM   Specimen: BLOOD RIGHT HAND  Result Value Ref Range Status   Specimen Description   Final    BLOOD RIGHT HAND BOTTLES DRAWN AEROBIC AND ANAEROBIC   Special Requests   Final    Blood Culture results may not be optimal due to an excessive volume of blood received in culture bottles   Culture   Final    NO GROWTH 5 DAYS Performed at [facility], [address] [place], [place] [address]    Report Status 05/05/2022 FINAL  Final    Labs: CBC: Recent Labs  Lab 08/06/22 0850  WBC 19.4*  NEUTROABS 15.7*  HGB 14.9  HCT 48.6  MCV 71.7*  PLT [id number]   Basic Metabolic Panel: Recent Labs  Lab 08/06/22 0953 08/06/22 1329  NA 135  --   K 3.4*  --   CL 97*  --   CO2 28  --   GLUCOSE 131*  --   BUN 23*  --   CREATININE 1.00  --   CALCIUM 8.8*  --   MG  --  1.6*  PHOS  --  2.9   Liver Function Tests: Recent Labs  Lab 08/06/22 0953  AST 17  ALT 14  ALKPHOS 74  BILITOT 0.6  PROT 7.6  ALBUMIN 4.2   CBG: No results for input(s): "GLUCAP" in the last 168 hours.  Discharge time spent: > 30 minutes.  Signed: Deatra James, MD [facility] 08/06/2022

## 2022-08-06 NOTE — Progress Notes (Signed)
Patient left AMA. MD Shahmehdi notified. AMA papers signed. Ivs out

## 2022-08-06 NOTE — Consult Note (Signed)
David Miranda, M.D. Gastroenterology & Hepatology                                           Patient Name: David Miranda Account #: '@[id number]'$ @   MRN: [id number] Admission Date: 08/06/2022 Date of Evaluation:  08/06/2022 Time of Evaluation: 12:14 PM   Referring Physician: Fredia Sorrow, MD  Chief Complaint:  coffee ground emesis, possible GOO  HPI:  David Miranda is a 37 y.o. 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  Gastroenterology was also consulted for concern for gastric outlet obstruction.  Patient report that he was doing relatively well until yesterday afternoon when he presented new onset of epigastric abdominal pain which was very severe, which was followed by multiple episodes of vomiting coffee-ground contents and some episodes of melena.  He denies any hematochezia or hematemesis.  Has not been using any NSAIDs or high-dose aspirin but he reports actively using methamphetamines with last use on Thursday.  States that he has not "used cocaine for months".  Notably, he ran out of his pantoprazole and has been taking omeprazole 20 mg daily.  No fever, chills, abdominal distention.  Notably, the patient has had a history of poor compliance to medications and his appointment. Has not followed up in our clinic despite making multiple appointment - he has not shown up to the follow up appointments.  The patient has presented with similar presentation in the past with last EGD in November showing gastrojejunal ulceration without obstruction.  In the ED, he was HD stable and afebrile. Labs were remarkable for CBC with WBC 19.4, hemoglobin 14.9 and platelets 341, CMP showed normal liver function panel and renal function  although his BUN was elevated at 23, potassium was 3.4, rest of the electrolytes were within normal limits.  Alcohol in blood was negative underwent a CT of the abdomen and pelvis with IV contrast showing presence of changes consistent with gastrojejunostomy and markedly distended stomach with fluid extending up to the esophagus and associated esophageal thickening.  There was stranding at the anastomosis of the gastrojejunostomy.  Stable cystic lesion in the pancreatic head.  Past Medical History: SEE CHRONIC ISSSUES: Past Medical History:  Diagnosis Date   GERD (gastroesophageal reflux disease)    Pancreatitis    Past Surgical History:  Past Surgical History:  Procedure Laterality Date   BIOPSY  09/01/2019   Procedure: BIOPSY;  Surgeon: Daneil Dolin, MD;  Location: [facility];  Service: Endoscopy;;  Gastric Biopsy for H. Pylori    ESOPHAGOGASTRODUODENOSCOPY (EGD) WITH PROPOFOL N/A 04/01/2019   Procedure: ESOPHAGOGASTRODUODENOSCOPY (EGD) WITH PROPOFOL;  Surgeon: Danie Binder, MD;  Location: [facility];  Service: Endoscopy;  Laterality: N/A;   ESOPHAGOGASTRODUODENOSCOPY (EGD) WITH PROPOFOL N/A 09/01/2019   Procedure: ESOPHAGOGASTRODUODENOSCOPY (EGD) WITH PROPOFOL;  Surgeon: Daneil Dolin, MD;  Location: [facility];  Service: Endoscopy;  Laterality: N/A;   ESOPHAGOGASTRODUODENOSCOPY (EGD) WITH PROPOFOL N/A 05/02/2022   Procedure: ESOPHAGOGASTRODUODENOSCOPY (EGD) WITH PROPOFOL;  Surgeon: Harvel Quale, MD;  Location: [facility];  Service: Gastroenterology;  Laterality: N/A;  with dilation   GASTROJEJUNOSTOMY N/A 04/04/2019   Procedure: GASTROJEJUNOSTOMY;  Surgeon: Virl Cagey, MD;  Location: [facility];  Service: General;  Laterality: N/A;   GASTROJEJUNOSTOMY  04/14/2019   Procedure: REVISION GASTROJEJUNOSTOMY TO ROUX-EN-Y;  Surgeon: Virl Cagey, MD;  Location: [facility];  Service: General;;   NO PAST SURGERIES     TESTICLE SURGERY     Family History:   Family History  Problem Relation Age of Onset   Cancer Other    Social History:  Social History   Tobacco Use   Smoking status: Every Day    Packs/day: 1.00    Types: Cigarettes   Smokeless tobacco: Never  Vaping Use   Vaping Use: Never used  Substance Use Topics   Alcohol use: Not Currently    Comment: pint every few days   Drug use: Yes    Types: Marijuana, Cocaine    Comment: occasionally    Home Medications:  Prior to Admission medications   Medication Sig Start Date End Date Taking? Authorizing Provider  acetaminophen (TYLENOL) 325 MG tablet Take 2 tablets (650 mg total) by mouth every 6 (six) hours as needed for mild pain (or Fever >/= 101). Patient not taking: Reported on 05/02/2022 09/02/19   Roxan Hockey, MD  calcium carbonate (TUMS - DOSED IN MG ELEMENTAL CALCIUM) 500 MG chewable tablet Chew 2 tablets (400 mg of elemental calcium total) by mouth daily as needed for indigestion or heartburn. Patient not taking: Reported on 05/02/2022 09/02/19   Roxan Hockey, MD  ferrous sulfate 325 (65 FE) MG EC tablet Take 1 tablet (325 mg total) by mouth 2 (two) times daily with a meal. Patient not taking: Reported on 05/02/2022 09/02/19   Roxan Hockey, MD  hydrOXYzine (ATARAX/VISTARIL) 25 MG tablet Take 1 tablet (25 mg total) by mouth 2 (two) times daily. Patient not taking: Reported on 05/02/2022 09/02/19   Roxan Hockey, MD  linaclotide [facility]) 145 MCG CAPS capsule Take 1 capsule (145 mcg total) by mouth daily before breakfast. Patient not taking: Reported on 05/02/2022 09/03/19   Roxan Hockey, MD  lipase/protease/amylase (CREON) 36000 UNITS CPEP capsule Take 2 capsules (72,000 Units total) by mouth 3 (three) times daily with meals. Patient not taking: Reported on 05/02/2022 09/02/19   Roxan Hockey, MD  Multiple Vitamin (MULTIVITAMIN WITH MINERALS) TABS tablet Take 1 tablet by mouth daily. Patient not taking: Reported on 05/02/2022 09/02/19   Roxan Hockey,  MD  nicotine (NICODERM CQ - DOSED IN MG/24 HOURS) 14 mg/24hr patch Place 1 patch (14 mg total) onto the skin daily. Patient not taking: Reported on 05/02/2022 09/03/19   Roxan Hockey, MD  omeprazole (PRILOSEC) 40 MG capsule Take 1 capsule (40 mg total) by mouth 2 (two) times daily. Patient not taking: Reported on 05/02/2022 09/02/19   Roxan Hockey, MD  oxyCODONE-acetaminophen (PERCOCET) 5-325 MG tablet Take 1 tablet by mouth every 6 (six) hours as needed. Patient not taking: Reported on 05/02/2022 10/16/20   Milton Ferguson, MD  pantoprazole (PROTONIX) 40 MG tablet Take 1 tablet (40 mg total) by mouth 2 (two) times daily. 10/16/20   Milton Ferguson, MD  polyethylene glycol (MIRALAX / GLYCOLAX) 17 g packet Take 17 g by mouth daily. Patient not taking: Reported on 05/02/2022 09/02/19   Roxan Hockey, MD  sucralfate (CARAFATE) 1 GM/10ML suspension Take 10 mLs (1 g total) by mouth 4 (four) times daily -  with meals and at bedtime. Patient not taking: Reported on 05/02/2022 10/16/20   Milton Ferguson, MD  traZODone (DESYREL) 150 MG tablet Take 1 tablet (150 mg total) by mouth at bedtime. Patient not  taking: Reported on 05/02/2022 09/02/19   Roxan Hockey, MD    Inpatient Medications:  Current Facility-Administered Medications:    0.9 %  sodium chloride infusion, , Intravenous, Continuous, Fredia Sorrow, MD   Derrill Memo ON 08/10/2022] pantoprazole (PROTONIX) injection 40 mg, 40 mg, Intravenous, Q12H, Fredia Sorrow, MD   pantoprozole (PROTONIX) 80 mg /NS 100 mL infusion, 8 mg/hr, Intravenous, Continuous, Fredia Sorrow, MD  Current Outpatient Medications:    acetaminophen (TYLENOL) 325 MG tablet, Take 2 tablets (650 mg total) by mouth every 6 (six) hours as needed for mild pain (or Fever >/= 101). (Patient not taking: Reported on 05/02/2022), Disp: 12 tablet, Rfl: 0   calcium carbonate (TUMS - DOSED IN MG ELEMENTAL CALCIUM) 500 MG chewable tablet, Chew 2 tablets (400 mg of elemental calcium  total) by mouth daily as needed for indigestion or heartburn. (Patient not taking: Reported on 05/02/2022), Disp: 30 tablet, Rfl: 1   ferrous sulfate 325 (65 FE) MG EC tablet, Take 1 tablet (325 mg total) by mouth 2 (two) times daily with a meal. (Patient not taking: Reported on 05/02/2022), Disp: 60 tablet, Rfl: 3   hydrOXYzine (ATARAX/VISTARIL) 25 MG tablet, Take 1 tablet (25 mg total) by mouth 2 (two) times daily. (Patient not taking: Reported on 05/02/2022), Disp: 60 tablet, Rfl: 0   linaclotide (LINZESS) 145 MCG CAPS capsule, Take 1 capsule (145 mcg total) by mouth daily before breakfast. (Patient not taking: Reported on 05/02/2022), Disp: 30 capsule, Rfl: 2   lipase/protease/amylase (CREON) 36000 UNITS CPEP capsule, Take 2 capsules (72,000 Units total) by mouth 3 (three) times daily with meals. (Patient not taking: Reported on 05/02/2022), Disp: 270 capsule, Rfl: 3   Multiple Vitamin (MULTIVITAMIN WITH MINERALS) TABS tablet, Take 1 tablet by mouth daily. (Patient not taking: Reported on 05/02/2022), Disp: 30 tablet, Rfl: 5   nicotine (NICODERM CQ - DOSED IN MG/24 HOURS) 14 mg/24hr patch, Place 1 patch (14 mg total) onto the skin daily. (Patient not taking: Reported on 05/02/2022), Disp: 28 patch, Rfl: 0   omeprazole (PRILOSEC) 40 MG capsule, Take 1 capsule (40 mg total) by mouth 2 (two) times daily. (Patient not taking: Reported on 05/02/2022), Disp: 60 capsule, Rfl: 5   oxyCODONE-acetaminophen (PERCOCET) 5-325 MG tablet, Take 1 tablet by mouth every 6 (six) hours as needed. (Patient not taking: Reported on 05/02/2022), Disp: 20 tablet, Rfl: 0   pantoprazole (PROTONIX) 40 MG tablet, Take 1 tablet (40 mg total) by mouth 2 (two) times daily., Disp: 60 tablet, Rfl: 1   polyethylene glycol (MIRALAX / GLYCOLAX) 17 g packet, Take 17 g by mouth daily. (Patient not taking: Reported on 05/02/2022), Disp: 30 each, Rfl: 3   sucralfate (CARAFATE) 1 GM/10ML suspension, Take 10 mLs (1 g total) by mouth 4 (four)  times daily -  with meals and at bedtime. (Patient not taking: Reported on 05/02/2022), Disp: 420 mL, Rfl: 0   traZODone (DESYREL) 150 MG tablet, Take 1 tablet (150 mg total) by mouth at bedtime. (Patient not taking: Reported on 05/02/2022), Disp: 30 tablet, Rfl: 2 Allergies: Patient has no known allergies.  Complete Review of Systems: GENERAL: negative for malaise, night sweats HEENT: No changes in hearing or vision, no nose bleeds or other nasal problems. NECK: Negative for lumps, goiter, pain and significant neck swelling RESPIRATORY: Negative for cough, wheezing CARDIOVASCULAR: Negative for chest pain, leg swelling, palpitations, orthopnea GI: SEE HPI MUSCULOSKELETAL: Negative for joint pain or swelling, back pain, and muscle pain. SKIN: Negative for lesions, rash PSYCH: Negative for  sleep disturbance, mood disorder and recent psychosocial stressors. HEMATOLOGY Negative for prolonged bleeding, bruising easily, and swollen nodes. ENDOCRINE: Negative for cold or heat intolerance, polyuria, polydipsia and goiter. NEURO: negative for tremor, gait imbalance, syncope and seizures. The remainder of the review of systems is noncontributory.  Physical Exam: BP (!) 132/95   Pulse 67   Temp 98.1 F (36.7 C) (Oral)   Resp 18   Ht '6\' 3"'$  (1.905 m)   Wt 81.6 kg   SpO2 98%   BMI 22.50 kg/m  GENERAL: The patient is AO x3, in no acute distress. HEENT: Head is normocephalic and atraumatic. EOMI are intact. Mouth is well hydrated and without lesions. NECK: Supple. No masses LUNGS: Clear to auscultation. No presence of rhonchi/wheezing/rales. Adequate chest expansion HEART: RRR, normal s1 and s2. ABDOMEN: tender to palpation in the upper abdomen worse in the epigastric region, no guarding, no peritoneal signs, and nondistended. BS +. No masses. EXTREMITIES: Without any cyanosis, clubbing, rash, lesions or edema. NEUROLOGIC: AOx3, no focal motor deficit. SKIN: no jaundice, no rashes  Laboratory  Data CBC:     Component Value Date/Time   WBC 19.4 (H) 08/06/2022 0850   RBC 6.78 (H) 08/06/2022 0850   HGB 14.9 08/06/2022 0850   HCT 48.6 08/06/2022 0850   PLT 341 08/06/2022 0850   MCV 71.7 (L) 08/06/2022 0850   MCH 22.0 (L) 08/06/2022 0850   MCHC 30.7 08/06/2022 0850   RDW 21.3 (H) 08/06/2022 0850   LYMPHSABS 2.0 08/06/2022 0850   MONOABS 1.5 (H) 08/06/2022 0850   EOSABS 0.1 08/06/2022 0850   BASOSABS 0.1 08/06/2022 0850   COAG: No results found for: "INR", "PROTIME"  BMP:     Latest Ref Rng & Units 08/06/2022    9:53 AM 05/03/2022    4:13 AM 05/02/2022    3:24 AM  BMP  Glucose 70 - 99 mg/dL 131  212  179   BUN 6 - 20 mg/dL '23  8  10   '$ Creatinine 0.61 - 1.24 mg/dL 1.00  0.84  0.74   Sodium 135 - 145 mmol/L 135  137  136   Potassium 3.5 - 5.1 mmol/L 3.4  4.3  3.5   Chloride 98 - 111 mmol/L 97  108  109   CO2 22 - 32 mmol/L '28  21  23   '$ Calcium 8.9 - 10.3 mg/dL 8.8  8.6  8.4     HEPATIC:     Latest Ref Rng & Units 08/06/2022    9:53 AM 05/02/2022    3:24 AM 05/01/2022    4:24 AM  Hepatic Function  Total Protein 6.5 - 8.1 g/dL 7.6   5.9   Albumin 3.5 - 5.0 g/dL 4.2  3.0  3.1   AST 15 - 41 U/L 17   19   ALT 0 - 44 U/L 14   14   Alk Phosphatase 38 - 126 U/L 74   60   Total Bilirubin 0.3 - 1.2 mg/dL 0.6   0.6     CARDIAC: No results found for: "CKTOTAL", "CKMB", "CKMBINDEX", "TROPONINI"   Imaging: I personally reviewed and interpreted the available imaging.  Assessment & Plan: David Miranda is a 37 y.o. 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  Gastroenterology was also consulted for  concern for gastric outlet obstruction.  The patient is presenting with clinical symptoms concerning for  upper gastrointestinal bleeding but also the imaging is highly suggestive of gastric outlet obstruction.  I reviewed these images and agree with the inflammatory changes at the anastomosis which could be related to an inflammation associated partial obstruction versus presence of a stricture.  Will need to evaluate this further with an EGD during the current hospitalization.  I would recommend PPI twice daily IV but we will also need to decompress his stomach with NG tube.  It is very possible that the use of methamphetamines is aggravating his previous ulceration - I counseled about the importance of completely avoiding the use of cocaine and methamphetamines.  We will check a U tox this hospitalization as this will be required prior to proceeding with an EGD.  Notably, the patient has had a history of poor compliance to medications and his appointment. Has not followed up in our clinic despite making multiple appointment - he has not shown up to the follow up appointments.  - Repeat CBC qday, transfuse if Hb <7 - Pantoprazole 40 mg q12h IVP - 2 large bore IV lines - Active T/S - Insert NGT, low intermittent suction - Keep NPO - Stop using meth and cocaine - Check U-tox - Avoid NSAIDs - Will proceed with EGD during the current hospitalization, pending U-Tox result  David Peppers, MD [facility]astro[facility] and [facility]

## 2022-08-06 NOTE — Progress Notes (Signed)
Date and time results received: 08/06/22 1407 (use smartphrase ".now" to insert current time)  Test: lactic acid Critical Value: 2.3   Name of Provider Notified: [email or web address]  Orders Received? Or Actions Taken?:  patient left ama

## 2022-08-06 NOTE — Assessment & Plan Note (Signed)
-   Serum alcohol level less than 10 -Monitoring closely for any withdrawals -Currently patient denies alcohol use or abuse

## 2022-08-06 NOTE — ED Notes (Signed)
Blood collected by phlebotomy

## 2022-08-22 DIAGNOSIS — R109 Unspecified abdominal pain: Secondary | ICD-10-CM | POA: Diagnosis not present

## 2022-08-22 DIAGNOSIS — Z4682 Encounter for fitting and adjustment of non-vascular catheter: Secondary | ICD-10-CM | POA: Diagnosis not present

## 2022-08-22 DIAGNOSIS — K3189 Other diseases of stomach and duodenum: Secondary | ICD-10-CM | POA: Diagnosis not present

## 2022-08-22 DIAGNOSIS — K209 Esophagitis, unspecified without bleeding: Secondary | ICD-10-CM | POA: Diagnosis not present

## 2022-08-22 DIAGNOSIS — K208 Other esophagitis without bleeding: Secondary | ICD-10-CM | POA: Diagnosis not present

## 2022-08-22 DIAGNOSIS — Z800 Family history of malignant neoplasm of digestive organs: Secondary | ICD-10-CM | POA: Diagnosis not present

## 2022-08-22 DIAGNOSIS — Z452 Encounter for adjustment and management of vascular access device: Secondary | ICD-10-CM | POA: Diagnosis not present

## 2022-08-22 DIAGNOSIS — N179 Acute kidney failure, unspecified: Secondary | ICD-10-CM | POA: Diagnosis not present

## 2022-08-22 DIAGNOSIS — R1013 Epigastric pain: Secondary | ICD-10-CM | POA: Diagnosis not present

## 2022-08-22 DIAGNOSIS — K30 Functional dyspepsia: Secondary | ICD-10-CM | POA: Diagnosis not present

## 2022-08-22 DIAGNOSIS — K920 Hematemesis: Secondary | ICD-10-CM | POA: Diagnosis not present

## 2022-08-22 DIAGNOSIS — Z980 Intestinal bypass and anastomosis status: Secondary | ICD-10-CM | POA: Diagnosis not present

## 2022-08-22 DIAGNOSIS — R10816 Epigastric abdominal tenderness: Secondary | ICD-10-CM | POA: Diagnosis not present

## 2022-08-22 DIAGNOSIS — K9189 Other postprocedural complications and disorders of digestive system: Secondary | ICD-10-CM | POA: Diagnosis not present

## 2022-08-22 DIAGNOSIS — D62 Acute posthemorrhagic anemia: Secondary | ICD-10-CM | POA: Diagnosis not present

## 2022-08-22 DIAGNOSIS — K449 Diaphragmatic hernia without obstruction or gangrene: Secondary | ICD-10-CM | POA: Diagnosis not present

## 2022-08-22 DIAGNOSIS — K2289 Other specified disease of esophagus: Secondary | ICD-10-CM | POA: Diagnosis not present

## 2022-08-22 DIAGNOSIS — F1290 Cannabis use, unspecified, uncomplicated: Secondary | ICD-10-CM | POA: Diagnosis not present

## 2022-08-22 DIAGNOSIS — K2100 Gastro-esophageal reflux disease with esophagitis, without bleeding: Secondary | ICD-10-CM | POA: Diagnosis not present

## 2022-08-22 DIAGNOSIS — F17210 Nicotine dependence, cigarettes, uncomplicated: Secondary | ICD-10-CM | POA: Diagnosis not present

## 2022-08-22 DIAGNOSIS — R070 Pain in throat: Secondary | ICD-10-CM | POA: Diagnosis not present

## 2022-08-22 DIAGNOSIS — R140 Abdominal distension (gaseous): Secondary | ICD-10-CM | POA: Diagnosis not present

## 2022-08-22 DIAGNOSIS — R112 Nausea with vomiting, unspecified: Secondary | ICD-10-CM | POA: Diagnosis not present

## 2022-08-22 DIAGNOSIS — R1030 Lower abdominal pain, unspecified: Secondary | ICD-10-CM | POA: Diagnosis not present

## 2022-08-23 DIAGNOSIS — R1030 Lower abdominal pain, unspecified: Secondary | ICD-10-CM | POA: Diagnosis not present

## 2022-08-23 DIAGNOSIS — R070 Pain in throat: Secondary | ICD-10-CM | POA: Diagnosis not present

## 2022-08-23 DIAGNOSIS — R109 Unspecified abdominal pain: Secondary | ICD-10-CM | POA: Diagnosis not present

## 2022-08-23 DIAGNOSIS — D62 Acute posthemorrhagic anemia: Secondary | ICD-10-CM | POA: Diagnosis not present

## 2022-08-24 DIAGNOSIS — D62 Acute posthemorrhagic anemia: Secondary | ICD-10-CM | POA: Diagnosis not present

## 2022-08-24 DIAGNOSIS — N179 Acute kidney failure, unspecified: Secondary | ICD-10-CM | POA: Diagnosis not present

## 2022-08-24 DIAGNOSIS — R109 Unspecified abdominal pain: Secondary | ICD-10-CM | POA: Diagnosis not present

## 2022-08-24 DIAGNOSIS — K209 Esophagitis, unspecified without bleeding: Secondary | ICD-10-CM | POA: Diagnosis not present

## 2023-02-04 DIAGNOSIS — R1033 Periumbilical pain: Secondary | ICD-10-CM | POA: Diagnosis not present

## 2023-02-04 DIAGNOSIS — F1910 Other psychoactive substance abuse, uncomplicated: Secondary | ICD-10-CM | POA: Diagnosis not present

## 2023-02-04 DIAGNOSIS — Z8719 Personal history of other diseases of the digestive system: Secondary | ICD-10-CM | POA: Diagnosis not present

## 2023-02-04 DIAGNOSIS — D509 Iron deficiency anemia, unspecified: Secondary | ICD-10-CM | POA: Diagnosis not present

## 2023-02-04 DIAGNOSIS — Z9884 Bariatric surgery status: Secondary | ICD-10-CM | POA: Diagnosis not present

## 2023-02-04 DIAGNOSIS — R933 Abnormal findings on diagnostic imaging of other parts of digestive tract: Secondary | ICD-10-CM | POA: Diagnosis not present

## 2023-02-04 DIAGNOSIS — R109 Unspecified abdominal pain: Secondary | ICD-10-CM | POA: Diagnosis not present

## 2023-02-04 DIAGNOSIS — Z79899 Other long term (current) drug therapy: Secondary | ICD-10-CM | POA: Diagnosis not present

## 2023-02-05 DIAGNOSIS — R1033 Periumbilical pain: Secondary | ICD-10-CM | POA: Diagnosis not present

## 2023-02-05 DIAGNOSIS — R1013 Epigastric pain: Secondary | ICD-10-CM | POA: Diagnosis not present

## 2023-02-05 DIAGNOSIS — Z79899 Other long term (current) drug therapy: Secondary | ICD-10-CM | POA: Diagnosis not present

## 2023-02-05 DIAGNOSIS — F1910 Other psychoactive substance abuse, uncomplicated: Secondary | ICD-10-CM | POA: Diagnosis not present

## 2023-02-05 DIAGNOSIS — R933 Abnormal findings on diagnostic imaging of other parts of digestive tract: Secondary | ICD-10-CM | POA: Diagnosis not present

## 2023-02-05 DIAGNOSIS — Z8619 Personal history of other infectious and parasitic diseases: Secondary | ICD-10-CM | POA: Diagnosis not present

## 2023-02-05 DIAGNOSIS — D649 Anemia, unspecified: Secondary | ICD-10-CM | POA: Diagnosis not present

## 2023-02-05 DIAGNOSIS — D509 Iron deficiency anemia, unspecified: Secondary | ICD-10-CM | POA: Diagnosis not present

## 2023-02-06 DIAGNOSIS — Z8659 Personal history of other mental and behavioral disorders: Secondary | ICD-10-CM | POA: Diagnosis not present

## 2023-02-06 DIAGNOSIS — K315 Obstruction of duodenum: Secondary | ICD-10-CM | POA: Diagnosis not present

## 2023-02-06 DIAGNOSIS — K2100 Gastro-esophageal reflux disease with esophagitis, without bleeding: Secondary | ICD-10-CM | POA: Diagnosis not present

## 2023-02-06 DIAGNOSIS — F1011 Alcohol abuse, in remission: Secondary | ICD-10-CM | POA: Diagnosis not present

## 2023-02-06 DIAGNOSIS — K2210 Ulcer of esophagus without bleeding: Secondary | ICD-10-CM | POA: Diagnosis not present

## 2023-02-06 DIAGNOSIS — F1420 Cocaine dependence, uncomplicated: Secondary | ICD-10-CM | POA: Diagnosis not present

## 2023-02-06 DIAGNOSIS — R1013 Epigastric pain: Secondary | ICD-10-CM | POA: Diagnosis not present

## 2023-02-06 DIAGNOSIS — F1910 Other psychoactive substance abuse, uncomplicated: Secondary | ICD-10-CM | POA: Diagnosis not present

## 2023-02-06 DIAGNOSIS — Z980 Intestinal bypass and anastomosis status: Secondary | ICD-10-CM | POA: Diagnosis not present

## 2023-02-06 DIAGNOSIS — F1520 Other stimulant dependence, uncomplicated: Secondary | ICD-10-CM | POA: Diagnosis not present

## 2023-02-06 DIAGNOSIS — D509 Iron deficiency anemia, unspecified: Secondary | ICD-10-CM | POA: Diagnosis not present

## 2023-02-06 DIAGNOSIS — F17210 Nicotine dependence, cigarettes, uncomplicated: Secondary | ICD-10-CM | POA: Diagnosis not present

## 2023-02-06 DIAGNOSIS — K289 Gastrojejunal ulcer, unspecified as acute or chronic, without hemorrhage or perforation: Secondary | ICD-10-CM | POA: Diagnosis not present

## 2023-07-12 DIAGNOSIS — F1010 Alcohol abuse, uncomplicated: Secondary | ICD-10-CM | POA: Diagnosis not present

## 2023-07-12 DIAGNOSIS — F1520 Other stimulant dependence, uncomplicated: Secondary | ICD-10-CM | POA: Diagnosis not present

## 2023-07-12 DIAGNOSIS — F1190 Opioid use, unspecified, uncomplicated: Secondary | ICD-10-CM | POA: Diagnosis not present

## 2023-07-12 DIAGNOSIS — Z3200 Encounter for pregnancy test, result unknown: Secondary | ICD-10-CM | POA: Diagnosis not present

## 2023-07-19 DIAGNOSIS — F1520 Other stimulant dependence, uncomplicated: Secondary | ICD-10-CM | POA: Diagnosis not present

## 2023-07-19 DIAGNOSIS — F1190 Opioid use, unspecified, uncomplicated: Secondary | ICD-10-CM | POA: Diagnosis not present

## 2023-07-19 DIAGNOSIS — F1010 Alcohol abuse, uncomplicated: Secondary | ICD-10-CM | POA: Diagnosis not present

## 2023-07-23 DIAGNOSIS — R1084 Generalized abdominal pain: Secondary | ICD-10-CM | POA: Diagnosis not present

## 2023-07-23 DIAGNOSIS — I10 Essential (primary) hypertension: Secondary | ICD-10-CM | POA: Diagnosis not present

## 2023-07-31 DIAGNOSIS — D509 Iron deficiency anemia, unspecified: Secondary | ICD-10-CM | POA: Diagnosis not present

## 2023-07-31 DIAGNOSIS — G8929 Other chronic pain: Secondary | ICD-10-CM | POA: Diagnosis not present

## 2023-07-31 DIAGNOSIS — K861 Other chronic pancreatitis: Secondary | ICD-10-CM | POA: Diagnosis not present

## 2023-07-31 DIAGNOSIS — R079 Chest pain, unspecified: Secondary | ICD-10-CM | POA: Diagnosis not present

## 2023-07-31 DIAGNOSIS — R109 Unspecified abdominal pain: Secondary | ICD-10-CM | POA: Diagnosis not present

## 2023-07-31 DIAGNOSIS — F17210 Nicotine dependence, cigarettes, uncomplicated: Secondary | ICD-10-CM | POA: Diagnosis not present

## 2023-07-31 DIAGNOSIS — D649 Anemia, unspecified: Secondary | ICD-10-CM | POA: Diagnosis not present

## 2023-07-31 DIAGNOSIS — R1013 Epigastric pain: Secondary | ICD-10-CM | POA: Diagnosis not present

## 2023-07-31 DIAGNOSIS — D62 Acute posthemorrhagic anemia: Secondary | ICD-10-CM | POA: Diagnosis not present

## 2023-07-31 DIAGNOSIS — K2101 Gastro-esophageal reflux disease with esophagitis, with bleeding: Secondary | ICD-10-CM | POA: Diagnosis not present

## 2023-07-31 DIAGNOSIS — F17220 Nicotine dependence, chewing tobacco, uncomplicated: Secondary | ICD-10-CM | POA: Diagnosis not present

## 2023-07-31 DIAGNOSIS — F1210 Cannabis abuse, uncomplicated: Secondary | ICD-10-CM | POA: Diagnosis not present

## 2023-07-31 DIAGNOSIS — K311 Adult hypertrophic pyloric stenosis: Secondary | ICD-10-CM | POA: Diagnosis not present

## 2023-07-31 DIAGNOSIS — S27813A Laceration of esophagus (thoracic part), initial encounter: Secondary | ICD-10-CM | POA: Diagnosis not present

## 2023-07-31 DIAGNOSIS — Z800 Family history of malignant neoplasm of digestive organs: Secondary | ICD-10-CM | POA: Diagnosis not present

## 2023-07-31 DIAGNOSIS — K315 Obstruction of duodenum: Secondary | ICD-10-CM | POA: Diagnosis not present

## 2023-07-31 DIAGNOSIS — K223 Perforation of esophagus: Secondary | ICD-10-CM | POA: Diagnosis not present

## 2023-07-31 DIAGNOSIS — Z980 Intestinal bypass and anastomosis status: Secondary | ICD-10-CM | POA: Diagnosis not present

## 2023-07-31 DIAGNOSIS — F1410 Cocaine abuse, uncomplicated: Secondary | ICD-10-CM | POA: Diagnosis not present

## 2023-07-31 DIAGNOSIS — R609 Edema, unspecified: Secondary | ICD-10-CM | POA: Diagnosis not present

## 2023-07-31 DIAGNOSIS — K920 Hematemesis: Secondary | ICD-10-CM | POA: Diagnosis not present

## 2023-07-31 DIAGNOSIS — K2289 Other specified disease of esophagus: Secondary | ICD-10-CM | POA: Diagnosis not present

## 2023-07-31 DIAGNOSIS — S1121XD Laceration without foreign body of pharynx and cervical esophagus, subsequent encounter: Secondary | ICD-10-CM | POA: Diagnosis not present

## 2023-08-01 DIAGNOSIS — K2289 Other specified disease of esophagus: Secondary | ICD-10-CM | POA: Diagnosis not present

## 2023-08-01 DIAGNOSIS — J690 Pneumonitis due to inhalation of food and vomit: Secondary | ICD-10-CM | POA: Diagnosis not present

## 2023-08-01 DIAGNOSIS — R1013 Epigastric pain: Secondary | ICD-10-CM | POA: Diagnosis not present

## 2023-08-01 DIAGNOSIS — J90 Pleural effusion, not elsewhere classified: Secondary | ICD-10-CM | POA: Diagnosis not present

## 2023-08-02 DIAGNOSIS — R1013 Epigastric pain: Secondary | ICD-10-CM | POA: Diagnosis not present

## 2023-11-03 DIAGNOSIS — D62 Acute posthemorrhagic anemia: Secondary | ICD-10-CM | POA: Diagnosis not present

## 2023-11-03 DIAGNOSIS — I10 Essential (primary) hypertension: Secondary | ICD-10-CM | POA: Diagnosis not present

## 2023-11-03 DIAGNOSIS — K922 Gastrointestinal hemorrhage, unspecified: Secondary | ICD-10-CM | POA: Diagnosis not present

## 2023-11-03 DIAGNOSIS — K2289 Other specified disease of esophagus: Secondary | ICD-10-CM | POA: Diagnosis not present

## 2023-11-03 DIAGNOSIS — R7881 Bacteremia: Secondary | ICD-10-CM | POA: Diagnosis not present

## 2023-11-03 DIAGNOSIS — Z716 Tobacco abuse counseling: Secondary | ICD-10-CM | POA: Diagnosis not present

## 2023-11-03 DIAGNOSIS — R1010 Upper abdominal pain, unspecified: Secondary | ICD-10-CM | POA: Diagnosis not present

## 2023-11-03 DIAGNOSIS — K921 Melena: Secondary | ICD-10-CM | POA: Diagnosis not present

## 2023-11-03 DIAGNOSIS — K449 Diaphragmatic hernia without obstruction or gangrene: Secondary | ICD-10-CM | POA: Diagnosis not present

## 2023-11-03 DIAGNOSIS — R1013 Epigastric pain: Secondary | ICD-10-CM | POA: Diagnosis not present

## 2023-11-03 DIAGNOSIS — D509 Iron deficiency anemia, unspecified: Secondary | ICD-10-CM | POA: Diagnosis not present

## 2023-11-03 DIAGNOSIS — K861 Other chronic pancreatitis: Secondary | ICD-10-CM | POA: Diagnosis not present

## 2023-11-03 DIAGNOSIS — F17210 Nicotine dependence, cigarettes, uncomplicated: Secondary | ICD-10-CM | POA: Diagnosis not present

## 2023-11-03 DIAGNOSIS — Z5329 Procedure and treatment not carried out because of patient's decision for other reasons: Secondary | ICD-10-CM | POA: Diagnosis not present

## 2023-11-03 DIAGNOSIS — F1210 Cannabis abuse, uncomplicated: Secondary | ICD-10-CM | POA: Diagnosis not present

## 2023-11-03 DIAGNOSIS — K920 Hematemesis: Secondary | ICD-10-CM | POA: Diagnosis not present

## 2023-11-03 DIAGNOSIS — Z9884 Bariatric surgery status: Secondary | ICD-10-CM | POA: Diagnosis not present

## 2023-11-03 DIAGNOSIS — K209 Esophagitis, unspecified without bleeding: Secondary | ICD-10-CM | POA: Diagnosis not present

## 2023-11-03 DIAGNOSIS — G8929 Other chronic pain: Secondary | ICD-10-CM | POA: Diagnosis not present

## 2023-11-03 DIAGNOSIS — K226 Gastro-esophageal laceration-hemorrhage syndrome: Secondary | ICD-10-CM | POA: Diagnosis not present

## 2023-11-03 DIAGNOSIS — K2101 Gastro-esophageal reflux disease with esophagitis, with bleeding: Secondary | ICD-10-CM | POA: Diagnosis not present

## 2023-11-03 DIAGNOSIS — F1410 Cocaine abuse, uncomplicated: Secondary | ICD-10-CM | POA: Diagnosis not present

## 2024-01-15 DIAGNOSIS — R1013 Epigastric pain: Secondary | ICD-10-CM | POA: Diagnosis not present

## 2024-01-15 DIAGNOSIS — Z8711 Personal history of peptic ulcer disease: Secondary | ICD-10-CM | POA: Diagnosis not present

## 2024-01-15 DIAGNOSIS — K3189 Other diseases of stomach and duodenum: Secondary | ICD-10-CM | POA: Diagnosis not present

## 2024-01-15 DIAGNOSIS — I129 Hypertensive chronic kidney disease with stage 1 through stage 4 chronic kidney disease, or unspecified chronic kidney disease: Secondary | ICD-10-CM | POA: Diagnosis not present

## 2024-01-15 DIAGNOSIS — F17220 Nicotine dependence, chewing tobacco, uncomplicated: Secondary | ICD-10-CM | POA: Diagnosis not present

## 2024-01-15 DIAGNOSIS — K319 Disease of stomach and duodenum, unspecified: Secondary | ICD-10-CM | POA: Diagnosis not present

## 2024-01-15 DIAGNOSIS — K56609 Unspecified intestinal obstruction, unspecified as to partial versus complete obstruction: Secondary | ICD-10-CM | POA: Diagnosis not present

## 2024-01-15 DIAGNOSIS — N189 Chronic kidney disease, unspecified: Secondary | ICD-10-CM | POA: Diagnosis not present

## 2024-01-15 DIAGNOSIS — K315 Obstruction of duodenum: Secondary | ICD-10-CM | POA: Diagnosis not present

## 2024-01-15 DIAGNOSIS — K920 Hematemesis: Secondary | ICD-10-CM | POA: Diagnosis not present

## 2024-01-15 DIAGNOSIS — K2901 Acute gastritis with bleeding: Secondary | ICD-10-CM | POA: Diagnosis not present

## 2024-01-15 DIAGNOSIS — K208 Other esophagitis without bleeding: Secondary | ICD-10-CM | POA: Diagnosis not present

## 2024-01-15 DIAGNOSIS — D62 Acute posthemorrhagic anemia: Secondary | ICD-10-CM | POA: Diagnosis not present

## 2024-01-15 DIAGNOSIS — K223 Perforation of esophagus: Secondary | ICD-10-CM | POA: Diagnosis not present

## 2024-01-15 DIAGNOSIS — K561 Intussusception: Secondary | ICD-10-CM | POA: Diagnosis not present

## 2024-01-15 DIAGNOSIS — K922 Gastrointestinal hemorrhage, unspecified: Secondary | ICD-10-CM | POA: Diagnosis not present

## 2024-01-15 DIAGNOSIS — K9189 Other postprocedural complications and disorders of digestive system: Secondary | ICD-10-CM | POA: Diagnosis not present

## 2024-01-15 DIAGNOSIS — Z5329 Procedure and treatment not carried out because of patient's decision for other reasons: Secondary | ICD-10-CM | POA: Diagnosis not present

## 2024-01-15 DIAGNOSIS — K311 Adult hypertrophic pyloric stenosis: Secondary | ICD-10-CM | POA: Diagnosis not present

## 2024-01-15 DIAGNOSIS — I10 Essential (primary) hypertension: Secondary | ICD-10-CM | POA: Diagnosis not present

## 2024-01-15 DIAGNOSIS — Z980 Intestinal bypass and anastomosis status: Secondary | ICD-10-CM | POA: Diagnosis not present

## 2024-01-15 DIAGNOSIS — K2101 Gastro-esophageal reflux disease with esophagitis, with bleeding: Secondary | ICD-10-CM | POA: Diagnosis not present

## 2024-01-15 DIAGNOSIS — R1110 Vomiting, unspecified: Secondary | ICD-10-CM | POA: Diagnosis not present

## 2024-01-15 DIAGNOSIS — K6389 Other specified diseases of intestine: Secondary | ICD-10-CM | POA: Diagnosis not present

## 2024-01-15 DIAGNOSIS — F17210 Nicotine dependence, cigarettes, uncomplicated: Secondary | ICD-10-CM | POA: Diagnosis not present

## 2024-01-15 DIAGNOSIS — K2091 Esophagitis, unspecified with bleeding: Secondary | ICD-10-CM | POA: Diagnosis not present

## 2024-03-19 ENCOUNTER — Encounter (INDEPENDENT_AMBULATORY_CARE_PROVIDER_SITE_OTHER): Payer: Self-pay | Admitting: Gastroenterology
